# Patient Record
Sex: Male | Born: 1935 | Race: Asian | Hispanic: No
Health system: Southern US, Academic
[De-identification: ages and names within clinical notes are randomized; demographics above are authoritative.]

## PROBLEM LIST (undated history)

## (undated) ENCOUNTER — Encounter

## (undated) ENCOUNTER — Ambulatory Visit

## (undated) ENCOUNTER — Ambulatory Visit: Payer: MEDICARE

## (undated) ENCOUNTER — Telehealth

## (undated) ENCOUNTER — Encounter: Attending: Hematology & Oncology | Primary: Hematology & Oncology

## (undated) ENCOUNTER — Other Ambulatory Visit

## (undated) ENCOUNTER — Ambulatory Visit: Payer: Medicare (Managed Care) | Attending: Hematology & Oncology | Primary: Hematology & Oncology

## (undated) ENCOUNTER — Encounter: Attending: General Acute Care Hospital | Primary: General Acute Care Hospital

## (undated) ENCOUNTER — Encounter: Attending: Adult Health | Primary: Adult Health

## (undated) ENCOUNTER — Ambulatory Visit: Payer: Medicare (Managed Care)

## (undated) DIAGNOSIS — I2510 Atherosclerotic heart disease of native coronary artery without angina pectoris: Secondary | ICD-10-CM

## (undated) DIAGNOSIS — I998 Other disorder of circulatory system: Secondary | ICD-10-CM

## (undated) DIAGNOSIS — Z8673 Personal history of transient ischemic attack (TIA), and cerebral infarction without residual deficits: Secondary | ICD-10-CM

## (undated) DIAGNOSIS — I10 Essential (primary) hypertension: Secondary | ICD-10-CM

## (undated) DIAGNOSIS — C9190 Lymphoid leukemia, unspecified not having achieved remission: Secondary | ICD-10-CM

## (undated) DIAGNOSIS — E785 Hyperlipidemia, unspecified: Secondary | ICD-10-CM

## (undated) HISTORY — DX: Hyperlipidemia, unspecified: E78.5

## (undated) HISTORY — DX: Lymphoid leukemia, unspecified not having achieved remission: C91.90

## (undated) HISTORY — DX: Atherosclerotic heart disease of native coronary artery without angina pectoris: I25.10

## (undated) HISTORY — PX: HEMORROIDECTOMY: SUR656

## (undated) HISTORY — DX: Essential (primary) hypertension: I10

## (undated) HISTORY — DX: Personal history of transient ischemic attack (TIA), and cerebral infarction without residual deficits: Z86.73

## (undated) HISTORY — DX: Other disorder of circulatory system: I99.8

## (undated) HISTORY — PX: LEG SURGERY: SHX1003

## (undated) MED ORDER — LIRAGLUTIDE SUBQ: SUBCUTANEOUS | 0 days

---

## 1998-01-28 ENCOUNTER — Ambulatory Visit (HOSPITAL_COMMUNITY): Admission: RE | Admit: 1998-01-28 | Discharge: 1998-01-28 | Payer: Self-pay | Admitting: Endocrinology

## 1998-04-01 ENCOUNTER — Ambulatory Visit (HOSPITAL_COMMUNITY): Admission: RE | Admit: 1998-04-01 | Discharge: 1998-04-01 | Payer: Self-pay | Admitting: Ophthalmology

## 1999-05-27 ENCOUNTER — Ambulatory Visit (HOSPITAL_COMMUNITY): Admission: RE | Admit: 1999-05-27 | Discharge: 1999-05-27 | Payer: Self-pay | Admitting: Gastroenterology

## 2000-08-19 ENCOUNTER — Other Ambulatory Visit: Admission: RE | Admit: 2000-08-19 | Discharge: 2000-08-19 | Payer: Self-pay | Admitting: Oncology

## 2004-09-14 ENCOUNTER — Ambulatory Visit: Payer: Self-pay | Admitting: Oncology

## 2004-12-01 ENCOUNTER — Ambulatory Visit: Payer: Self-pay | Admitting: Oncology

## 2005-03-09 ENCOUNTER — Ambulatory Visit: Payer: Self-pay | Admitting: Oncology

## 2005-06-10 ENCOUNTER — Ambulatory Visit: Payer: Self-pay | Admitting: Oncology

## 2005-09-09 ENCOUNTER — Ambulatory Visit: Payer: Self-pay | Admitting: Oncology

## 2005-12-08 ENCOUNTER — Ambulatory Visit: Payer: Self-pay | Admitting: Oncology

## 2005-12-09 LAB — CBC WITH DIFFERENTIAL/PLATELET
BASO%: 0.7 % (ref 0.0–2.0)
EOS%: 5.9 % (ref 0.0–7.0)
LYMPH%: 31.3 % (ref 14.0–48.0)
MCH: 28.3 pg (ref 28.0–33.4)
MCHC: 33.3 g/dL (ref 32.0–35.9)
MONO#: 0.7 10*3/uL (ref 0.1–0.9)
Platelets: 224 10*3/uL (ref 145–400)
RBC: 3.9 10*6/uL — ABNORMAL LOW (ref 4.20–5.71)
WBC: 9.8 10*3/uL (ref 4.0–10.0)

## 2005-12-10 LAB — LACTATE DEHYDROGENASE: LDH: 104 U/L (ref 94–250)

## 2005-12-10 LAB — COMPREHENSIVE METABOLIC PANEL
ALT: 11 U/L (ref 0–40)
AST: 14 U/L (ref 0–37)
Alkaline Phosphatase: 55 U/L (ref 39–117)
CO2: 27 mEq/L (ref 19–32)
Sodium: 139 mEq/L (ref 135–145)
Total Bilirubin: 0.4 mg/dL (ref 0.3–1.2)
Total Protein: 6.1 g/dL (ref 6.0–8.3)

## 2005-12-10 LAB — BETA 2 MICROGLOBULIN, SERUM: Beta-2 Microglobulin: 2.59 mg/L — ABNORMAL HIGH (ref 1.01–1.73)

## 2006-01-06 LAB — CBC WITH DIFFERENTIAL/PLATELET
Basophils Absolute: 0.1 10*3/uL (ref 0.0–0.1)
Eosinophils Absolute: 0.4 10*3/uL (ref 0.0–0.5)
HGB: 11.6 g/dL — ABNORMAL LOW (ref 13.0–17.1)
LYMPH%: 28.6 % (ref 14.0–48.0)
MCV: 84.3 fL (ref 81.6–98.0)
MONO%: 7.5 % (ref 0.0–13.0)
NEUT#: 4.9 10*3/uL (ref 1.5–6.5)
Platelets: 234 10*3/uL (ref 145–400)

## 2006-01-22 ENCOUNTER — Ambulatory Visit: Payer: Self-pay | Admitting: Oncology

## 2006-01-27 LAB — CBC WITH DIFFERENTIAL/PLATELET
BASO%: 0.7 % (ref 0.0–2.0)
Basophils Absolute: 0.1 10*3/uL (ref 0.0–0.1)
EOS%: 5.9 % (ref 0.0–7.0)
Eosinophils Absolute: 0.5 10*3/uL (ref 0.0–0.5)
HCT: 36.4 % — ABNORMAL LOW (ref 38.7–49.9)
HGB: 11.9 g/dL — ABNORMAL LOW (ref 13.0–17.1)
LYMPH%: 29.8 % (ref 14.0–48.0)
MCH: 26.9 pg — ABNORMAL LOW (ref 28.0–33.4)
MCHC: 32.6 g/dL (ref 32.0–35.9)
MCV: 82.4 fL (ref 81.6–98.0)
MONO#: 0.7 10*3/uL (ref 0.1–0.9)
MONO%: 8.1 % (ref 0.0–13.0)
NEUT#: 4.9 10*3/uL (ref 1.5–6.5)
NEUT%: 55.5 % (ref 40.0–75.0)
Platelets: 195 10*3/uL (ref 145–400)
RBC: 4.42 10*6/uL (ref 4.20–5.71)
RDW: 14.4 % (ref 11.2–14.6)
WBC: 8.8 10*3/uL (ref 4.0–10.0)
lymph#: 2.6 10*3/uL (ref 0.9–3.3)

## 2006-02-17 LAB — CBC WITH DIFFERENTIAL/PLATELET
Basophils Absolute: 0.1 10*3/uL (ref 0.0–0.1)
Eosinophils Absolute: 0.4 10*3/uL (ref 0.0–0.5)
HCT: 35.9 % — ABNORMAL LOW (ref 38.7–49.9)
MCH: 26.7 pg — ABNORMAL LOW (ref 28.0–33.4)
MCHC: 33.1 g/dL (ref 32.0–35.9)
MONO%: 11.5 % (ref 0.0–13.0)
Platelets: 228 10*3/uL (ref 145–400)
RBC: 4.45 10*6/uL (ref 4.20–5.71)
WBC: 7.7 10*3/uL (ref 4.0–10.0)
lymph#: 2.3 10*3/uL (ref 0.9–3.3)

## 2006-03-10 ENCOUNTER — Ambulatory Visit: Payer: Self-pay | Admitting: Oncology

## 2006-03-10 LAB — CBC WITH DIFFERENTIAL/PLATELET
BASO%: 0.8 % (ref 0.0–2.0)
Basophils Absolute: 0.1 10*3/uL (ref 0.0–0.1)
EOS%: 4.2 % (ref 0.0–7.0)
Eosinophils Absolute: 0.3 10*3/uL (ref 0.0–0.5)
HCT: 37.6 % — ABNORMAL LOW (ref 38.7–49.9)
HGB: 12.5 g/dL — ABNORMAL LOW (ref 13.0–17.1)
LYMPH%: 30.6 % (ref 14.0–48.0)
MCH: 27 pg — ABNORMAL LOW (ref 28.0–33.4)
MCHC: 33.3 g/dL (ref 32.0–35.9)
MCV: 81.2 fL — ABNORMAL LOW (ref 81.6–98.0)
MONO#: 0.6 10*3/uL (ref 0.1–0.9)
MONO%: 7.5 % (ref 0.0–13.0)
NEUT#: 4.5 10*3/uL (ref 1.5–6.5)
NEUT%: 56.9 % (ref 40.0–75.0)
Platelets: 238 10*3/uL (ref 145–400)
RBC: 4.62 10*6/uL (ref 4.20–5.71)
RDW: 16.1 % — ABNORMAL HIGH (ref 11.2–14.6)
WBC: 7.9 10*3/uL (ref 4.0–10.0)
lymph#: 2.4 10*3/uL (ref 0.9–3.3)

## 2006-03-14 LAB — BETA 2 MICROGLOBULIN, SERUM: Beta-2 Microglobulin: 2.23 mg/L — ABNORMAL HIGH (ref 1.01–1.73)

## 2006-03-14 LAB — SPEP & IFE WITH QIG
Alpha-2-Globulin: 11.6 % (ref 7.1–11.8)
Beta Globulin: 7.9 % — ABNORMAL HIGH (ref 4.7–7.2)
Gamma Globulin: 6.1 % — ABNORMAL LOW (ref 11.1–18.8)
IgG (Immunoglobin G), Serum: 388 mg/dL — ABNORMAL LOW (ref 694–1618)
Total Protein, Serum Electrophoresis: 6 g/dL (ref 6.0–8.3)

## 2006-03-14 LAB — LACTATE DEHYDROGENASE: LDH: 106 U/L (ref 94–250)

## 2006-03-14 LAB — COMPREHENSIVE METABOLIC PANEL
Albumin: 4.3 g/dL (ref 3.5–5.2)
BUN: 18 mg/dL (ref 6–23)
Calcium: 9.2 mg/dL (ref 8.4–10.5)
Chloride: 95 mEq/L — ABNORMAL LOW (ref 96–112)
Glucose, Bld: 155 mg/dL — ABNORMAL HIGH (ref 70–99)
Potassium: 4.2 mEq/L (ref 3.5–5.3)

## 2006-04-21 LAB — CBC WITH DIFFERENTIAL/PLATELET
Basophils Absolute: 0.1 10*3/uL (ref 0.0–0.1)
HCT: 34.6 % — ABNORMAL LOW (ref 38.7–49.9)
HGB: 11.4 g/dL — ABNORMAL LOW (ref 13.0–17.1)
LYMPH%: 34.9 % (ref 14.0–48.0)
MONO#: 0.6 10*3/uL (ref 0.1–0.9)
NEUT%: 53.7 % (ref 40.0–75.0)
Platelets: 230 10*3/uL (ref 145–400)
WBC: 8.8 10*3/uL (ref 4.0–10.0)
lymph#: 3.1 10*3/uL (ref 0.9–3.3)

## 2006-05-06 ENCOUNTER — Ambulatory Visit: Payer: Self-pay | Admitting: Oncology

## 2006-05-09 LAB — CBC WITH DIFFERENTIAL/PLATELET
BASO%: 1.2 % (ref 0.0–2.0)
Basophils Absolute: 0.1 10*3/uL (ref 0.0–0.1)
EOS%: 3.6 % (ref 0.0–7.0)
HCT: 35.4 % — ABNORMAL LOW (ref 38.7–49.9)
HGB: 11.8 g/dL — ABNORMAL LOW (ref 13.0–17.1)
LYMPH%: 39.7 % (ref 14.0–48.0)
MCH: 27.3 pg — ABNORMAL LOW (ref 28.0–33.4)
MCHC: 33.2 g/dL (ref 32.0–35.9)
MCV: 82.2 fL (ref 81.6–98.0)
MONO%: 8.3 % (ref 0.0–13.0)
NEUT%: 47.2 % (ref 40.0–75.0)
Platelets: 204 10*3/uL (ref 145–400)

## 2006-05-09 LAB — COMPREHENSIVE METABOLIC PANEL
Albumin: 4.4 g/dL (ref 3.5–5.2)
Alkaline Phosphatase: 53 U/L (ref 39–117)
BUN: 20 mg/dL (ref 6–23)
Calcium: 9.5 mg/dL (ref 8.4–10.5)
Creatinine, Ser: 0.99 mg/dL (ref 0.40–1.50)
Glucose, Bld: 110 mg/dL — ABNORMAL HIGH (ref 70–99)
Potassium: 4.1 mEq/L (ref 3.5–5.3)

## 2006-05-09 LAB — MORPHOLOGY

## 2006-06-13 ENCOUNTER — Ambulatory Visit (HOSPITAL_COMMUNITY): Admission: RE | Admit: 2006-06-13 | Discharge: 2006-06-13 | Payer: Self-pay | Admitting: Gastroenterology

## 2006-06-13 ENCOUNTER — Encounter (INDEPENDENT_AMBULATORY_CARE_PROVIDER_SITE_OTHER): Payer: Self-pay | Admitting: *Deleted

## 2006-06-30 ENCOUNTER — Ambulatory Visit: Payer: Self-pay | Admitting: Oncology

## 2006-07-04 LAB — CBC WITH DIFFERENTIAL/PLATELET
BASO%: 0.7 % (ref 0.0–2.0)
Eosinophils Absolute: 0.4 10*3/uL (ref 0.0–0.5)
HCT: 35.6 % — ABNORMAL LOW (ref 38.7–49.9)
LYMPH%: 36.8 % (ref 14.0–48.0)
MCHC: 33.3 g/dL (ref 32.0–35.9)
MONO#: 0.7 10*3/uL (ref 0.1–0.9)
NEUT#: 5.1 10*3/uL (ref 1.5–6.5)
NEUT%: 51 % (ref 40.0–75.0)
Platelets: 200 10*3/uL (ref 145–400)
RBC: 4.19 10*6/uL — ABNORMAL LOW (ref 4.20–5.71)
WBC: 9.9 10*3/uL (ref 4.0–10.0)
lymph#: 3.7 10*3/uL — ABNORMAL HIGH (ref 0.9–3.3)

## 2006-08-24 ENCOUNTER — Ambulatory Visit: Payer: Self-pay | Admitting: Oncology

## 2006-08-29 LAB — CBC WITH DIFFERENTIAL/PLATELET
BASO%: 0.5 % (ref 0.0–2.0)
Eosinophils Absolute: 0.4 10*3/uL (ref 0.0–0.5)
LYMPH%: 30.5 % (ref 14.0–48.0)
MCHC: 32.8 g/dL (ref 32.0–35.9)
MONO#: 0.6 10*3/uL (ref 0.1–0.9)
NEUT#: 5.8 10*3/uL (ref 1.5–6.5)
RBC: 4.17 10*6/uL — ABNORMAL LOW (ref 4.20–5.71)
RDW: 13.9 % (ref 11.2–14.6)
WBC: 9.8 10*3/uL (ref 4.0–10.0)
lymph#: 3 10*3/uL (ref 0.9–3.3)

## 2006-09-26 LAB — CBC WITH DIFFERENTIAL/PLATELET
BASO%: 0.7 % (ref 0.0–2.0)
Basophils Absolute: 0.1 10*3/uL (ref 0.0–0.1)
HCT: 35.2 % — ABNORMAL LOW (ref 38.7–49.9)
HGB: 11.7 g/dL — ABNORMAL LOW (ref 13.0–17.1)
LYMPH%: 37.2 % (ref 14.0–48.0)
MCHC: 33.2 g/dL (ref 32.0–35.9)
MONO#: 0.7 10*3/uL (ref 0.1–0.9)
NEUT%: 51.7 % (ref 40.0–75.0)
Platelets: 172 10*3/uL (ref 145–400)
WBC: 9.6 10*3/uL (ref 4.0–10.0)

## 2006-12-21 ENCOUNTER — Ambulatory Visit: Payer: Self-pay | Admitting: Oncology

## 2006-12-26 LAB — CBC WITH DIFFERENTIAL/PLATELET
BASO%: 0.3 % (ref 0.0–2.0)
Basophils Absolute: 0 10*3/uL (ref 0.0–0.1)
EOS%: 2.2 % (ref 0.0–7.0)
HCT: 32.7 % — ABNORMAL LOW (ref 38.7–49.9)
HGB: 11.1 g/dL — ABNORMAL LOW (ref 13.0–17.1)
LYMPH%: 33.6 % (ref 14.0–48.0)
MCH: 28.7 pg (ref 28.0–33.4)
MCHC: 33.9 g/dL (ref 32.0–35.9)
MCV: 84.7 fL (ref 81.6–98.0)
MONO%: 8.7 % (ref 0.0–13.0)
NEUT%: 55.2 % (ref 40.0–75.0)
Platelets: 188 10*3/uL (ref 145–400)
lymph#: 3.2 10*3/uL (ref 0.9–3.3)

## 2006-12-27 LAB — COMPREHENSIVE METABOLIC PANEL
ALT: 16 U/L (ref 0–53)
AST: 13 U/L (ref 0–37)
Alkaline Phosphatase: 67 U/L (ref 39–117)
BUN: 18 mg/dL (ref 6–23)
Calcium: 8.8 mg/dL (ref 8.4–10.5)
Creatinine, Ser: 0.96 mg/dL (ref 0.40–1.50)
Total Bilirubin: 0.4 mg/dL (ref 0.3–1.2)

## 2007-03-09 ENCOUNTER — Ambulatory Visit: Payer: Self-pay | Admitting: Oncology

## 2007-03-13 LAB — CBC WITH DIFFERENTIAL/PLATELET
BASO%: 0.6 % (ref 0.0–2.0)
Basophils Absolute: 0.1 10*3/uL (ref 0.0–0.1)
EOS%: 3.7 % (ref 0.0–7.0)
HGB: 11.4 g/dL — ABNORMAL LOW (ref 13.0–17.1)
MCH: 28.3 pg (ref 28.0–33.4)
MCHC: 34 g/dL (ref 32.0–35.9)
MCV: 83.3 fL (ref 81.6–98.0)
MONO%: 7.1 % (ref 0.0–13.0)
NEUT%: 46.1 % (ref 40.0–75.0)
RDW: 13.6 % (ref 11.2–14.6)

## 2007-03-14 LAB — COMPREHENSIVE METABOLIC PANEL
AST: 15 U/L (ref 0–37)
Alkaline Phosphatase: 62 U/L (ref 39–117)
BUN: 19 mg/dL (ref 6–23)
Creatinine, Ser: 0.92 mg/dL (ref 0.40–1.50)
Potassium: 4.1 mEq/L (ref 3.5–5.3)

## 2007-06-15 ENCOUNTER — Ambulatory Visit: Payer: Self-pay | Admitting: Oncology

## 2007-06-20 LAB — CBC & DIFF AND RETIC
Basophils Absolute: 0.1 10*3/uL (ref 0.0–0.1)
Eosinophils Absolute: 0.4 10*3/uL (ref 0.0–0.5)
HCT: 33.9 % — ABNORMAL LOW (ref 38.7–49.9)
HGB: 11.6 g/dL — ABNORMAL LOW (ref 13.0–17.1)
LYMPH%: 43.7 % (ref 14.0–48.0)
MCH: 28.8 pg (ref 28.0–33.4)
MCV: 84.2 fL (ref 81.6–98.0)
MONO%: 6.4 % (ref 0.0–13.0)
NEUT#: 4.6 10*3/uL (ref 1.5–6.5)
NEUT%: 45 % (ref 40.0–75.0)
Platelets: 179 10*3/uL (ref 145–400)
RDW: 13.9 % (ref 11.2–14.6)
RETIC #: 67.7 10*3/uL (ref 31.8–103.9)

## 2007-06-21 LAB — COMPREHENSIVE METABOLIC PANEL
ALT: 13 U/L (ref 0–53)
AST: 15 U/L (ref 0–37)
Albumin: 4.3 g/dL (ref 3.5–5.2)
BUN: 17 mg/dL (ref 6–23)
CO2: 27 mEq/L (ref 19–32)
Calcium: 9.1 mg/dL (ref 8.4–10.5)
Chloride: 98 mEq/L (ref 96–112)
Potassium: 4.1 mEq/L (ref 3.5–5.3)

## 2007-06-21 LAB — LACTATE DEHYDROGENASE: LDH: 114 U/L (ref 94–250)

## 2007-06-21 LAB — IGG, IGA, IGM: IgG (Immunoglobin G), Serum: 303 mg/dL — ABNORMAL LOW (ref 694–1618)

## 2007-09-11 ENCOUNTER — Ambulatory Visit: Payer: Self-pay | Admitting: Oncology

## 2007-09-13 LAB — CBC WITH DIFFERENTIAL/PLATELET
Basophils Absolute: 0.1 10*3/uL (ref 0.0–0.1)
EOS%: 2.3 % (ref 0.0–7.0)
HCT: 32.6 % — ABNORMAL LOW (ref 38.7–49.9)
HGB: 10.6 g/dL — ABNORMAL LOW (ref 13.0–17.1)
LYMPH%: 45.4 % (ref 14.0–48.0)
MCH: 27.5 pg — ABNORMAL LOW (ref 28.0–33.4)
MCHC: 32.7 g/dL (ref 32.0–35.9)
MCV: 84.3 fL (ref 81.6–98.0)
MONO%: 4.5 % (ref 0.0–13.0)
NEUT%: 47.3 % (ref 40.0–75.0)

## 2007-09-14 LAB — IGG, IGA, IGM
IgA: 11 mg/dL — ABNORMAL LOW (ref 68–378)
IgM, Serum: 10 mg/dL — ABNORMAL LOW (ref 60–263)

## 2007-09-14 LAB — COMPREHENSIVE METABOLIC PANEL
AST: 13 U/L (ref 0–37)
Alkaline Phosphatase: 70 U/L (ref 39–117)
BUN: 18 mg/dL (ref 6–23)
Calcium: 8.8 mg/dL (ref 8.4–10.5)
Creatinine, Ser: 1 mg/dL (ref 0.40–1.50)
Total Bilirubin: 0.3 mg/dL (ref 0.3–1.2)

## 2007-11-30 ENCOUNTER — Ambulatory Visit: Payer: Self-pay | Admitting: Oncology

## 2007-12-01 ENCOUNTER — Ambulatory Visit: Payer: Self-pay | Admitting: Internal Medicine

## 2007-12-01 ENCOUNTER — Inpatient Hospital Stay (HOSPITAL_COMMUNITY): Admission: AD | Admit: 2007-12-01 | Discharge: 2007-12-04 | Payer: Self-pay | Admitting: Pulmonary Disease

## 2007-12-01 ENCOUNTER — Ambulatory Visit: Payer: Self-pay | Admitting: Pulmonary Disease

## 2007-12-07 ENCOUNTER — Telehealth: Payer: Self-pay | Admitting: Internal Medicine

## 2007-12-19 ENCOUNTER — Ambulatory Visit: Payer: Self-pay | Admitting: Internal Medicine

## 2007-12-19 DIAGNOSIS — R05 Cough: Secondary | ICD-10-CM

## 2008-03-14 ENCOUNTER — Ambulatory Visit: Payer: Self-pay | Admitting: Oncology

## 2008-03-19 LAB — CBC WITH DIFFERENTIAL/PLATELET
Basophils Absolute: 0 10*3/uL (ref 0.0–0.1)
Eosinophils Absolute: 0.1 10*3/uL (ref 0.0–0.5)
HGB: 10.9 g/dL — ABNORMAL LOW (ref 13.0–17.1)
MCV: 83.2 fL (ref 81.6–98.0)
MONO#: 0.9 10*3/uL (ref 0.1–0.9)
MONO%: 12 % (ref 0.0–13.0)
NEUT#: 3.7 10*3/uL (ref 1.5–6.5)
RDW: 15 % — ABNORMAL HIGH (ref 11.2–14.6)
WBC: 7.5 10*3/uL (ref 4.0–10.0)

## 2008-03-20 ENCOUNTER — Ambulatory Visit (HOSPITAL_COMMUNITY): Admission: RE | Admit: 2008-03-20 | Discharge: 2008-03-20 | Payer: Self-pay | Admitting: Oncology

## 2008-03-20 LAB — COMPREHENSIVE METABOLIC PANEL
Albumin: 4.2 g/dL (ref 3.5–5.2)
Alkaline Phosphatase: 60 U/L (ref 39–117)
BUN: 22 mg/dL (ref 6–23)
CO2: 28 mEq/L (ref 19–32)
Calcium: 9.2 mg/dL (ref 8.4–10.5)
Chloride: 94 mEq/L — ABNORMAL LOW (ref 96–112)
Glucose, Bld: 163 mg/dL — ABNORMAL HIGH (ref 70–99)
Potassium: 3.8 mEq/L (ref 3.5–5.3)
Sodium: 132 mEq/L — ABNORMAL LOW (ref 135–145)
Total Protein: 5.8 g/dL — ABNORMAL LOW (ref 6.0–8.3)

## 2008-03-20 LAB — BETA 2 MICROGLOBULIN, SERUM: Beta-2 Microglobulin: 4.38 mg/L — ABNORMAL HIGH (ref 1.01–1.73)

## 2008-04-12 ENCOUNTER — Ambulatory Visit: Payer: Self-pay | Admitting: Internal Medicine

## 2008-04-12 DIAGNOSIS — J8409 Other alveolar and parieto-alveolar conditions: Secondary | ICD-10-CM | POA: Insufficient documentation

## 2008-04-12 DIAGNOSIS — R609 Edema, unspecified: Secondary | ICD-10-CM

## 2008-04-12 DIAGNOSIS — J984 Other disorders of lung: Secondary | ICD-10-CM

## 2008-05-07 ENCOUNTER — Ambulatory Visit: Payer: Self-pay | Admitting: Cardiology

## 2008-07-29 ENCOUNTER — Ambulatory Visit: Payer: Self-pay | Admitting: Pulmonary Disease

## 2008-08-06 ENCOUNTER — Encounter (INDEPENDENT_AMBULATORY_CARE_PROVIDER_SITE_OTHER): Payer: Self-pay | Admitting: *Deleted

## 2008-08-21 ENCOUNTER — Ambulatory Visit: Payer: Self-pay | Admitting: Internal Medicine

## 2008-08-21 DIAGNOSIS — J45909 Unspecified asthma, uncomplicated: Secondary | ICD-10-CM | POA: Insufficient documentation

## 2008-11-07 ENCOUNTER — Telehealth (INDEPENDENT_AMBULATORY_CARE_PROVIDER_SITE_OTHER): Payer: Self-pay | Admitting: *Deleted

## 2008-12-13 ENCOUNTER — Ambulatory Visit: Payer: Self-pay | Admitting: Oncology

## 2008-12-17 LAB — CBC WITH DIFFERENTIAL/PLATELET
Basophils Absolute: 0 10*3/uL (ref 0.0–0.1)
EOS%: 3.2 % (ref 0.0–7.0)
HCT: 34.4 % — ABNORMAL LOW (ref 38.4–49.9)
HGB: 11.4 g/dL — ABNORMAL LOW (ref 13.0–17.1)
MCH: 27.8 pg (ref 27.2–33.4)
MCV: 83.7 fL (ref 79.3–98.0)
MONO%: 5.2 % (ref 0.0–14.0)
NEUT%: 40.9 % (ref 39.0–75.0)

## 2008-12-18 LAB — COMPREHENSIVE METABOLIC PANEL
AST: 15 U/L (ref 0–37)
Alkaline Phosphatase: 62 U/L (ref 39–117)
BUN: 22 mg/dL (ref 6–23)
Calcium: 9.1 mg/dL (ref 8.4–10.5)
Chloride: 97 mEq/L (ref 96–112)
Creatinine, Ser: 1.08 mg/dL (ref 0.40–1.50)
Glucose, Bld: 102 mg/dL — ABNORMAL HIGH (ref 70–99)

## 2009-05-22 ENCOUNTER — Telehealth (INDEPENDENT_AMBULATORY_CARE_PROVIDER_SITE_OTHER): Payer: Self-pay | Admitting: *Deleted

## 2009-06-09 ENCOUNTER — Encounter (INDEPENDENT_AMBULATORY_CARE_PROVIDER_SITE_OTHER): Payer: Self-pay | Admitting: Internal Medicine

## 2009-06-09 ENCOUNTER — Inpatient Hospital Stay (HOSPITAL_COMMUNITY): Admission: EM | Admit: 2009-06-09 | Discharge: 2009-06-10 | Payer: Self-pay | Admitting: Emergency Medicine

## 2009-06-09 ENCOUNTER — Ambulatory Visit: Payer: Self-pay | Admitting: Oncology

## 2009-06-09 HISTORY — PX: US ECHOCARDIOGRAPHY: HXRAD669

## 2009-06-10 ENCOUNTER — Ambulatory Visit: Payer: Self-pay | Admitting: Oncology

## 2009-10-03 ENCOUNTER — Ambulatory Visit: Payer: Self-pay | Admitting: Oncology

## 2009-10-09 LAB — CBC & DIFF AND RETIC
BASO%: 0.5 % (ref 0.0–2.0)
Eosinophils Absolute: 0.5 10*3/uL (ref 0.0–0.5)
Immature Retic Fract: 10.3 % (ref 0.00–13.40)
LYMPH%: 51.4 % — ABNORMAL HIGH (ref 14.0–49.0)
Platelets: 177 10*3/uL (ref 140–400)
RBC: 3.99 10*6/uL — ABNORMAL LOW (ref 4.20–5.82)
RDW: 14.1 % (ref 11.0–14.6)
Retic %: 1.4 % (ref 0.50–1.60)
Retic Ct Abs: 55.86 10*3/uL (ref 24.10–77.50)
WBC: 13.2 10*3/uL — ABNORMAL HIGH (ref 4.0–10.3)
lymph#: 6.8 10*3/uL — ABNORMAL HIGH (ref 0.9–3.3)

## 2009-10-09 LAB — COMPREHENSIVE METABOLIC PANEL
ALT: 16 U/L (ref 0–53)
Albumin: 4 g/dL (ref 3.5–5.2)
Alkaline Phosphatase: 45 U/L (ref 39–117)
CO2: 30 mEq/L (ref 19–32)
Potassium: 4.4 mEq/L (ref 3.5–5.3)
Sodium: 141 mEq/L (ref 135–145)
Total Bilirubin: 0.6 mg/dL (ref 0.3–1.2)

## 2009-10-09 LAB — LACTATE DEHYDROGENASE: LDH: 112 U/L (ref 94–250)

## 2010-03-14 ENCOUNTER — Emergency Department (HOSPITAL_COMMUNITY)
Admission: EM | Admit: 2010-03-14 | Discharge: 2010-03-14 | Payer: Self-pay | Source: Home / Self Care | Admitting: Emergency Medicine

## 2010-03-16 ENCOUNTER — Telehealth (INDEPENDENT_AMBULATORY_CARE_PROVIDER_SITE_OTHER): Payer: Self-pay | Admitting: *Deleted

## 2010-03-20 ENCOUNTER — Ambulatory Visit: Payer: Self-pay | Admitting: Internal Medicine

## 2010-04-07 ENCOUNTER — Ambulatory Visit: Payer: Self-pay | Admitting: Internal Medicine

## 2010-04-07 ENCOUNTER — Telehealth: Payer: Self-pay | Admitting: Internal Medicine

## 2010-04-17 ENCOUNTER — Ambulatory Visit: Payer: Self-pay | Admitting: Internal Medicine

## 2010-04-17 DIAGNOSIS — B370 Candidal stomatitis: Secondary | ICD-10-CM

## 2010-04-20 ENCOUNTER — Ambulatory Visit: Payer: Self-pay | Admitting: Cardiovascular Disease

## 2010-04-21 ENCOUNTER — Telehealth (INDEPENDENT_AMBULATORY_CARE_PROVIDER_SITE_OTHER): Payer: Self-pay | Admitting: *Deleted

## 2010-04-27 ENCOUNTER — Telehealth (INDEPENDENT_AMBULATORY_CARE_PROVIDER_SITE_OTHER): Payer: Self-pay | Admitting: *Deleted

## 2010-04-28 ENCOUNTER — Ambulatory Visit: Payer: Self-pay

## 2010-04-28 ENCOUNTER — Ambulatory Visit: Payer: Self-pay | Admitting: Cardiology

## 2010-04-28 ENCOUNTER — Encounter: Payer: Self-pay | Admitting: Cardiology

## 2010-04-28 ENCOUNTER — Encounter (HOSPITAL_COMMUNITY)
Admission: RE | Admit: 2010-04-28 | Discharge: 2010-05-25 | Payer: Self-pay | Source: Home / Self Care | Admitting: Cardiovascular Disease

## 2010-04-29 ENCOUNTER — Ambulatory Visit: Payer: Self-pay | Admitting: Cardiovascular Disease

## 2010-04-30 ENCOUNTER — Ambulatory Visit: Payer: Self-pay | Admitting: Cardiovascular Disease

## 2010-05-01 ENCOUNTER — Inpatient Hospital Stay (HOSPITAL_BASED_OUTPATIENT_CLINIC_OR_DEPARTMENT_OTHER): Admission: RE | Admit: 2010-05-01 | Discharge: 2010-05-01 | Payer: Self-pay | Admitting: Cardiovascular Disease

## 2010-05-01 ENCOUNTER — Ambulatory Visit: Payer: Self-pay | Admitting: Cardiovascular Disease

## 2010-05-08 ENCOUNTER — Ambulatory Visit: Payer: Self-pay | Admitting: Internal Medicine

## 2010-05-15 ENCOUNTER — Ambulatory Visit: Payer: Self-pay | Admitting: Cardiovascular Disease

## 2010-05-15 ENCOUNTER — Encounter: Payer: Self-pay | Admitting: Internal Medicine

## 2010-05-29 ENCOUNTER — Ambulatory Visit: Payer: Self-pay | Admitting: Internal Medicine

## 2010-06-19 ENCOUNTER — Telehealth: Payer: Self-pay | Admitting: Internal Medicine

## 2010-07-08 ENCOUNTER — Ambulatory Visit: Payer: Self-pay | Admitting: Cardiology

## 2010-07-10 ENCOUNTER — Ambulatory Visit: Payer: Self-pay | Admitting: Internal Medicine

## 2010-07-10 LAB — CONVERTED CEMR LAB
CRP, High Sensitivity: 11.05 — ABNORMAL HIGH (ref 0.00–5.00)
Pro B Natriuretic peptide (BNP): 87.4 pg/mL (ref 0.0–100.0)

## 2010-08-22 ENCOUNTER — Emergency Department (HOSPITAL_COMMUNITY)
Admission: EM | Admit: 2010-08-22 | Discharge: 2010-08-23 | Payer: Self-pay | Source: Home / Self Care | Admitting: Emergency Medicine

## 2010-09-09 ENCOUNTER — Ambulatory Visit: Payer: Self-pay | Admitting: Internal Medicine

## 2010-09-11 ENCOUNTER — Ambulatory Visit
Admission: RE | Admit: 2010-09-11 | Discharge: 2010-09-11 | Payer: Self-pay | Source: Home / Self Care | Attending: Internal Medicine | Admitting: Internal Medicine

## 2010-09-17 ENCOUNTER — Encounter
Admission: RE | Admit: 2010-09-17 | Discharge: 2010-09-17 | Payer: Self-pay | Source: Home / Self Care | Attending: Gastroenterology | Admitting: Gastroenterology

## 2010-09-18 ENCOUNTER — Other Ambulatory Visit: Payer: Self-pay | Admitting: Internal Medicine

## 2010-09-18 DIAGNOSIS — R911 Solitary pulmonary nodule: Secondary | ICD-10-CM

## 2010-09-20 ENCOUNTER — Encounter: Payer: Self-pay | Admitting: Internal Medicine

## 2010-09-20 ENCOUNTER — Encounter: Payer: Self-pay | Admitting: Gastroenterology

## 2010-09-29 NOTE — Progress Notes (Signed)
  Phone Note Outgoing Call Call back at [facility] Phone [phone]   Call placed by: Darrick Penna Call placed to: Patient Reason for Call: Confirm/change Appt Summary of Call: Reviewed information on My[employer] Information Sheet (see scanned document for further details).  Spoke with patient.      Nuclear Med Background Indications for Stress Test: Evaluation for Ischemia, Stent Patency   History: Angioplasty, Asthma, Echo, Heart Catheterization, Myocardial Perfusion Study, Stents  History Comments: 99 MPS normal 99 Stent RCA 10 Echo normal  Symptoms: DOE    Nuclear Pre-Procedure Cardiac Risk Factors: Hypertension, IDDM Type 2, Lipids, TIA Height (in): 65

## 2010-09-29 NOTE — Assessment & Plan Note (Signed)
Summary: rov/ mbw   Visit Type:  Follow-up Copy to:  Dr. Charna Elizabeth Primary Provider/Referring Provider:  Dr. Reather Littler - PMD, Dr Marchelle Gearing  - Pulmonary, Dr Arty Baumgartner- GI, Dr Katherina Right - Cards, Dr Darnelle Catalan - Onc  CC:  The patient is here for follow-up...PFT's today...CT chest and stress test have been done...the patient says his breathing has improved but still has a cough that is mostly dry and he notices this after meals and at night.  History of Present Illness: BRIEF: Micheal Rivers was admitted to [facility] in April 2009 for  rt sided pneumonia; b) new asthma exacerbation physiology - admit Fev1 30%; and  c) chronic cough in setting of URI and  ACE inhibitor; ACE inhibitor stopped. At followup, he felt well and stopped all asthma inhalers.   OV 04/12/2008: returns to followup after >3 months. A month ago had cough/wheeze. Saw Dr. Darrall Dears PA for routine followup (see pmhx) and reminded to have his pna followup CT chest. This done on 03/20/2008 shows new LL Sup Segment nodules with tree in bud. However, cough/wheeze have spontanously resolved.  However, still dyspneic when taking aerobic walks  No new problems. REC: repeat CT in 2 monts  CT 05/07/2008: :LB6 tree in bud - resolved. Old stable 4mm nodules +. PPD - negative. REC: expectant followup. Rpt CT in 1 year for 4mm nodules in Sept 2010  OV 07/29/2008: Saw Dr. Shelle Iron on urgent basis for chest congestion. Was wheezing on exam. CXR - negative. REC: restart symbicort, short course prednisone  OV 08/21/2008: Feels fine. No dyspnea, congestion, wheeze, chest pain, hemoptysis, edema, chills, weight loss. Compliant with symbicort 160/4.5 but is unsure why he needs it. Asking for lower dose so started on symbicort 80/.4.5 2 puff bid. .    OV 03/20/2010: Not seen in 19 months.  STates he was doing well so he felt there was no need to see me. He did not take symbicort as advised., 6-8 months ago developed cough again. Cough made worse immediately after eating  or when lying down.Cough improved by resting. Albuterol as needed does not make any difference. Cough is mostly dry. Also past 4-6 months worsening dyspnea. Now working out at [employer] and notices dyspnea on ellipticals or treadmills after 5-10 minutes. Dyspnea is relieved by rest and albuterol inhaler. Occasional associated wheezing present and GERD present on occassoion after spicy food. He takes sample dexilant for this. HE did have worsening on 03/14/2010 and went toER. Was discharged after neb Rx on prednisone +/- abx. Currently doing better with pred but feels symptoms coming back after prednisone being tapered off. Of note, when we exerted him in office he did get very dyspneic walking 185 feet x 2 . REC: START DULERA, CONTINUE DEXLILANT, BE VERY COMPLIANT, ROV with PFT   OV April 17, 2010: Now compliant with [facility] and DExilant. In fact has been using dulera 4 puff two times a day (2 times the dose) but his technique is poor. Feels cough and "breathing" is 70% better but "dyspnea" (in his terms that is dyspnea with exertion) is unchanged or only somewhat better. No new complaints. REC: a) 1 year CT chest; b) Rx THRUSH; c) RE-EDUCATED on DULERA; d) START SINGULAIR; e) COTNINUE DEXILANT and aNTI-GERD measures      May 29, 2010: Followup Nodule, ASthma, Cough (due to Asthma & Gerd), and Dyspnea (due to Asthma and ? other causes). He has intentionally lost 7# since last visit. Also, compliant with singulair  started last visit and dulera. With above, dyspnea on exertion has resolved. IN fact, he walked 185 feet x 3 laps in office wihtout problems; a huge improvement in 2 months. CArdiac cause for dyspnea has been ruled out. HAd Cath 05/01/2010 (Dr Elease Hashimoto) that showed patent stent, normal LV and no other coronary abnormalities. IN terms of cough, he is still bothered by it. IT is UNCHANGED. This is despite weight loss, improved dietary compliance advised by Dr. Loreta Ave. Cough is dry. Moderate in  severity.  Occurs mostly when he lies down to go to bed and has 5-15 episodes. Better in sitting posture. Does not bother his sleep. Happens some during day too. Singulair and dulera and dexilant not helping cough. CT scan done 05/08/2010 shows new LLL basal segment airspace disease but he denies infectious symptoms of fever. Denies frank choking.   Preventive Screening-Counseling & Management  Alcohol-Tobacco     Smoking Status: never     Passive Smoke Exposure: no  Current Medications (verified): 1)  Vytorin 10-20 Mg Tabs (Ezetimibe-Simvastatin) .... Take 1 Tablet By Mouth Once A Day 2)  Tekturna 300 Mg  Tabs (Aliskiren Fumarate) .... Once Daily 3)  Labetalol Hcl 200 Mg  Tabs (Labetalol Hcl) .... Once Daily 4)  Cardura 8 Mg  Tabs (Doxazosin Mesylate) .... At Bedtime 5)  Aggrenox 25-200 Mg Xr12h-Cap (Aspirin-Dipyridamole) .... Take One Tablet By Mouth  Two Times A Day 6)  Glimepiride 1 Mg Tabs (Glimepiride) .... Take 1 Tablet By Mouth Once A Day 7)  Actos 15 Mg  Tabs (Pioglitazone Hcl) .... Once Daily 8)  Metformin Hcl 500 Mg  Tb24 (Metformin Hcl) .Marland Kitchen.. 1  Tab By Mouth in The Morning and 3 Tablets By Mouth in The Evening 9)  Indapamide 1.25 Mg  Tabs (Indapamide) .... Once Daily 10)  Norvasc 10 Mg Tabs (Amlodipine Besylate) .... Take 1 Tablet By Mouth Once A Day 11)  Oxybutynin Chloride 5 Mg Tabs (Oxybutynin Chloride) .... Take One Tablet By Mouth At Bedtime 12)  Victoza 18 Mg/55ml Soln (Liraglutide) .... Take 0.6 Mg Injection At Bedtime 13)  Vitamin D3 5000 Unit Caps (Cholecalciferol) .... Take 1 Tablet By Mouth Once A Day 14)  Osteo Bi-Flex Adv Double St  Tabs (Misc Natural Products) .... Take 1 Tablet By Mouth Once A Day 15)  Flax Seed Oil 1000 Mg Caps (Flaxseed (Linseed)) .... Take 1 Tablet By Mouth Once A Day 16)  Vitamin B-12 1000 Mcg Tabs (Cyanocobalamin) .... Take 1 Tablet By Mouth Once A Day 17)  Singulair 10 Mg Tabs (Montelukast Sodium) .Marland Kitchen.. 1 By Mouth At Bedtime 18)  Dulera 200-5  Mcg/act Aero (Mometasone Furo-Formoterol Fum) .... 2 Puffs Two Times A Day 19)  Dexilant 30 Mg Cpdr (Dexlansoprazole) .Marland Kitchen.. 1 By Mouth Daily  Allergies (verified): No Known Drug Allergies  Past History:  Past medical, surgical, family and social histories (including risk factors) reviewed, and no changes noted (except as noted below).  Past Medical History: Reviewed history from 08/21/2008 and no changes required. CLL x 1998. Took rituxan 2006/2007. Followed by Dr. Patrice Paradise. Under remission per history Early sigmoid diverticula on colonoscopy 2007 Diabetes Hyperlipidemia Post viral reactive cough since 2003. Typical episodes last 2-4 weeks.  [place] in 07/2007 and had URI/"cold" and then had cough that resolved in 3 weeks (typical for him).  Normal stress test per hx - 10/2007.  CAD/MI 1998 DEcember - while in [place].  s/ coronary stent by Dr. Elease Hashimoto. EDema in legs - noted August 2009 exam.  Per patient prior edema has beeon only on air travel to [place] PPD negative - 03/2008 - 04/2008  Past Surgical History: Reviewed history from 12/01/2007 and no changes required. s/p colonoscopy 2007 - sessile hyperplastic polyp s/p coronary stent  Past Pulmonary History:  Pulmonary History: #ASTHMA  03/20/2010: Poor tech ->   Fev1 1.14L/45%, FVC 1.45L/41.5%, Ratio 79 -> start dulera  04/07/2010 Good tech - FEv1 1.79:/75%, FVC 2.4L/67% -> add singulair 05/29/2010: Fev1 1.85L/77%, Ratio 75 (69)  Family History: Reviewed history from 12/01/2007 and no changes required. Denies asthma, copd Family History C V A / Stroke : dad, mom Family History Coronary Heart Disease: mom Family History Diabetes: dad Family History Hypertension: mom  Social History: Reviewed history from 03/20/2010 and no changes required. 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  The patient denies shortness of breath with activity, shortness of breath at rest, productive cough, coughing up blood, chest pain, irregular heartbeats, acid heartburn, indigestion, loss of appetite, weight change, abdominal pain, difficulty swallowing, sore throat, tooth/dental problems, headaches, nasal congestion/difficulty breathing through nose, sneezing, itching, ear ache, anxiety, depression, hand/feet swelling, joint stiffness or pain, rash, change in color of mucus, and fever.    Vital Signs:  Patient profile:   76 year old male Height:      65 inches (165.10 cm) Weight:      178 pounds (80.91 kg) BMI:     29.73 O2 Sat:      96 % on Room air Temp:     98.1 degrees F (36.72 degrees C) oral Pulse rate:   78 / minute BP sitting:   124 / 78  (right arm) Cuff size:   regular  Vitals Entered By: Michel Bickers CMA (May 29, 2010 9:29 AM)  O2 Sat at Rest %:  96 O2 Flow:  Room air  Serial Vital Signs/Assessments:  Comments: Ambulatory Pulse Oximetry  Resting; HR__63___    02 Sat__96___  Lap1 (185 feet)   HR_97____   02 Sat_96____ Lap2 (185 feet)   HR__106___   02 Sat__94___    Lap3 (185 feet)   HR__96___   02 Sat___95__  _x__Test Completed without Difficulty ___Test Stopped due to:   By: Carron Curie CMA   CC: The patient is here for follow-up...PFT's today...CT chest and stress test have been done...the patient says his breathing has improved but still has a cough that is mostly dry and he notices this after meals and at night Comments Medications reviewed with patient Daytime phone verified. Michel Bickers CMA  May 29, 2010 9:39 AM   Physical Exam  General:   male in nad. [place] like he lost some weight Head:  normocephalic and atraumatic Eyes:  PERRLA/EOM intact; conjunctiva and sclera clear Ears:  TMs intact and clear with normal canals Nose:  no deformity, discharge, inflammation, or lesions Mouth:  no deformity or lesions.  some cariesMelampatti Class III.  Thrush resolved. Neck:  no masses, thyromegaly, or abnormal cervical nodes Chest Wall:  no deformities noted Lungs:  clear bilaterally to auscultation and percussion Heart:  egular rhythm, normal rate, no murmurs, no rubs, and no gallops.   Abdomen:  truncal obesity, soft, normal bowel sounds, no mass Msk:  no deformity or scoliosis noted with normal posture Pulses:  pulses normal Extremities:  new  bilateral edema + to ++ present (never noticed edema in past) Neurologic:  CN II-XII grossly intact with normal reflexes, coordination, muscle strength and tone Skin:  intact without lesions or rashes Cervical Nodes:  no significant adenopathy Psych:  alert and cooperative; normal mood and affect; normal attention span and concentration   CT of Chest  Procedure date:  05/08/2010  Findings:      NEW LLL basal segment airspace dissease compared to sept 2009 Old nodules are stable/better and need no further followup  Pulmonary Function Test Date: 05/29/2010 Gender: Male  Pre-Spirometry FVC    Value: 2.46 L/min   % Pred: 69% % FEV1    Value: 1.85 L     % Pred: 77% % FEV1/FVC  Value: 75 %     Pred: 69 %     Impression & Recommendations:  Problem # 1:  THRUSH (ICD-112.0) Assessment Improved reswolved  plan innstructed to wash/rinse mouth after mdi use Orders: Est. Patient Level IV (16109) HFA Instruction (60454)  Problem # 2:  PULMONARY NODULE (ICD-518.89) Assessment: Improved  4mm noodule(s) in CT sept 2009. REsolved/Stable/Imprived CT 2011  plan no further followup   Problem # 3:  INTRINSIC ASTHMA, UNSPECIFIED (ICD-493.10) Assessment: Improved  FEv1 shows further improvement after adding singulair in aug 2011 to dulera and with proper compliance. In fact, associated wtit this dyspnea has resolved. tehrefore, have to assume that dyspnea was due to asthma.  Will continue dulera (samples) and singulair (script sent to various pharmacies so  he can shop costs, he will also ask about accolate). Will do spirometry at fu. Flu shot if not done already  Problem # 4:  COUGH (ICD-786.2) Assessment: Deteriorated Cough is unchanged despite fev1/asthma improvement.  CT 05/08/2010 shows new LLL infitlrates - he did not have infectious symptoms at that time. Wonder if he is having GERD refractory to dexilant. This is based on fact cough is worse when he lies down and he has new Lower Lobe infitlrates. Spoke to his GI Doc Dr. Loreta Ave. REcommended pH probe or BRAVO study to sort this out.  Orders: Est. 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 No ninfectious symptoms. ? aspiration  plan per cough/gerd section - await results of BRAVO study repeat ct 07/08/2010 - 2months or so  Orders: Est. Patient Level IV (29562) HFA Instruction [phone]) Radiology Referral (Radiology)  Medications Added to Medication List This Visit: 1)  Singulair 10 Mg Tabs (Montelukast sodium) .Marland Kitchen.. 1 by mouth at bedtime 2)  Dulera 200-5 Mcg/act Aero (Mometasone furo-formoterol fum) .... 2 puffs two times a day 3)  Dexilant 30 Mg Cpdr (Dexlansoprazole) .Marland Kitchen.. 1 by mouth daily  Other Orders: Prescription Created Electronically [phone]) Flu Vaccine 58yrs + MEDICARE PATIENTS (N6295) Administration Flu vaccine - MCR (M8413)  Patient Instructions: 1)  #ASTHMA 2)   - numbers are best ever 3)   - continue singulair 10mg  by mouth at bedtime 4)       - [facility] 5)      - also ask them if accolate is cheaper than singulair 6)   - contnue dulera 2 puff two times a day - take 2 samples 7)   - show your technique again to my nurse 8)  #COUGH 9)    - sorry it is not better 10)   -have the BRAVO study by Dr. Loreta Ave - call her  11)  #LEFT LUNG NODULE 12)    - have CT scan chest around 07/08/2010 13)   #FOLLOWUP 14)   - after CT chest 15)   - spirometry at PFT lab at return Prescriptions: SINGULAIR 10 MG TABS (MONTELUKAST SODIUM) 1 by mouth at bedtime  #30 x 1   Entered and Authorized by:   Kalman Shan MD   Signed by:   Kalman Shan MD on 05/29/2010   Method used:   Electronically to        [facility]  [phone]* (retail)       [address]       [place], [place]  [address]       Ph: [phone] or [phone]       Fax: [phone]   RxID:   [id number] SINGULAIR 10 MG TABS (MONTELUKAST SODIUM) 1 by mouth at bedtime  #30 x 1   Entered and Authorized by:   Kalman Shan MD   Signed by:   Kalman Shan MD on 05/29/2010   Method used:   Electronically to        [facility] [address] (retail)       [address] [place], [place]  [address]       Ph: [phone]       Fax: [phone]   RxID:   [id number]   Flu Vaccine Consent Questions     Do you have a history of severe allergic reactions to this vaccine? no    Any prior history of allergic reactions to egg and/or gelatin? no    Do you have a sensitivity to the preservative Thimersol? no    Do you have a past history of Guillan-Barre Syndrome? no    Do you currently have an acute febrile illness? no    Have you ever had a severe reaction to latex? no    Vaccine information given and explained to patient? yes    Are you currently pregnant? no    Lot Number:[id number]   Exp Date:02/27/2011   Site Given  right Deltoid IMlu Carron Curie CMA  May 29, 2010 11:22 AM

## 2010-09-29 NOTE — Miscellaneous (Signed)
Summary: Orders Update pft charges  Clinical Lists Changes  Orders: Added new Service order of Carbon Monoxide diffusing w/capacity (94720) - Signed Added new Service order of Lung Volumes (94240) - Signed Added new Service order of Spirometry (Pre & Post) (94060) - Signed 

## 2010-09-29 NOTE — Progress Notes (Signed)
Summary: pft better  Phone Note Outgoing Call   Summary of Call: pls let him know pft have improved with the dulera. He should continue with it Initial call taken by: Kalman Shan MD,  April 07, 2010 5:41 PM  Follow-up for Phone Call        [facility] X1 Carver Fila  April 10, 2010 2:29 PM   Additional Follow-up for Phone Call Additional follow up Details #1::        pt called back and informed him of the results. pt verbalized understanding. Pt is aware of his apt he has aug 19 Mindy Silva  April 10, 2010 4:45 PM

## 2010-09-29 NOTE — Letter (Signed)
Summary: [facility]   Imported By: Sherian Rein 05/27/2010 11:41:52  _____________________________________________________________________  External Attachment:    Type:   Image     Comment:   External Document

## 2010-09-29 NOTE — Assessment & Plan Note (Signed)
Summary: hfu//lmr   Visit Type:  Hospital Follow-up Copy to:  Dr. Charna Elizabeth Primary Provider/Referring Provider:  Dr. Reather Littler  CC:  hospital follow today---meds updated with pt today--discuss his BP meds today--cough started back yesterday after finishing the prednisone taper--with yellow sputum with  the cough he will have some SOB.  History of Present Illness: BRIEF: Micheal Rivers was admitted to [facility] in April 2009 for  rt sided pneumonia; b) new asthma exacerbation physiology - admit Fev1 30%; and  c) ACE inhibitor stopped due to background history of chronic cough following URI alhtough unsure if this was cause of cough. AT followup, he felt well and stopped all asthma inhalers.   OV 04/12/2008: returns to followup after >3 months. A months ago had cough/wheeze. Saw Dr. Darrall Dears PA for routine followup (see pmhx) and reminded to have his pna followup CT chest. This done on 03/20/2008 shows new LL Sup Segment nodules with tree in bud. However, cough/wheeze have spontanously resolved.  However, still dyspneic when taking aerobic walks  No new problems. REC: repeat CT in 2 monts  CT 05/07/2008: :LB6 tree in bud - resolved. Old stable 4mm nodules +. PPD - negative. REC: expectant followup. Rpt CT in 1 year for 4mm nodules in Sept 2010  OV 07/29/2008: Saw Dr. Shelle Iron on urgent basis for chest congestion. Was wheezing on exam. CXR - negative. REC: restart symbicort, short course prednisone  OV 08/21/2008: Feels fine. No dyspnea, congestion, wheeze, chest pain, hemoptysis, edema, chills, weight loss. Compliant with symbicort 160/4.5 but is unsure why he needs it. Asking for lower dose so started on symbicort 80/.4.5 2 puff bid. .    OV 03/20/2010: Not seen in 19 months. Apologized for no shows. STates he was doing well so he felt there was no need to see me. He did not take symbicort as advised 19 months ago. However, 6-8 months ago developed cough again. Cough made worse immediately after eating or  when lying down.Cough improved by resting. Albuterol as needed does not make any difference to cough. Cough is mostly dry. Also past 4-6 months worsening dyspnea. Now working out at [employer] and notices dyspnea on ellipticals or treadmills after 5-10 minutes. Dyspnea is relieved by rest and albuterol inhaler. Occasional associated wheezing present and GERD present on occassoion after spicy food. He takes sample dexilant for this. HE did have worsening on 03/14/2010 and went toER. Was discharged after neb Rx on prednisone +/- abx. Currently doing better with pred but feels symptoms coming back after prednisone being tapered off. Of note, when we exerted him in office he did get very dyspneic walking 185 feet x 2   Preventive Screening-Counseling & Management  Alcohol-Tobacco     Smoking Status: never     Passive Smoke Exposure: no  Allergies (verified): No Known Drug Allergies  Comments:  Nurse/Medical Assistant: The patient's medications and allergies were reviewed with the patient and were updated in the Medication and Allergy Lists.  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 CLL x 1998. Took rituxan 2006/2007. Followed by Dr. Patrice Paradise. Under remission per history Early sigmoid diverticula on colonoscopy 2007 Diabetes Hyperlipidemia Post viral reactive cough since 2003. Typical episodes last 2-4 weeks.  [place] in 07/2007 and had URI/"cold" and then  had cough that resolved in 3 weeks (typical for him).  Normal stress test per hx - 10/2007.  CAD/MI 1998 DEcember - while in [place].  s/ coronary stent by Dr. Elease Hashimoto. EDema in legs - noted August 2009 exam. Per patient prior edema has beeon only on air travel to [place] PPD negative - 03/2008 - 04/2008  Past Surgical History: Reviewed history from 12/01/2007 and no changes required. s/p colonoscopy 2007 - sessile hyperplastic polyp s/p coronary stent  Family History: Reviewed history from 12/01/2007 and no changes required. 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  The patient denies anorexia, fever, weight loss, weight gain, vision loss, decreased hearing, hoarseness, chest pain, syncope, peripheral edema, headaches, hemoptysis, abdominal pain, melena, hematochezia, severe indigestion/heartburn, hematuria, incontinence, genital sores, muscle weakness, suspicious skin lesions, transient blindness, difficulty walking, depression, unusual weight change, abnormal bleeding, enlarged lymph nodes, angioedema, breast masses, and testicular masses.    Vital Signs:  Patient profile:   76 year old male Height:      65 inches Weight:      184.50 pounds BMI:     30.81 O2 Sat:      96 % on Room air Temp:     98.0 degrees F oral Pulse rate:   71 / minute BP sitting:   128 / 50  (right arm) Cuff size:   regular  Vitals Entered By: Randell Loop CMA (March 20, 2010 10:09 AM)  O2 Sat at Rest %:  96 O2 Flow:  Room air CC: hospital follow today---meds updated with pt today--discuss his BP meds today--cough started back  yesterday after finishing the prednisone taper--with yellow sputum with  the cough he will have some SOB Is Patient Diabetic? Yes Pain Assessment Patient in pain? no      Comments MEDS REVIEWED WITH PT DAYTIME PHONE NUMBER REVIEWED TODAY Randell Loop CMA  March 20, 2010 10:16 AM    Ambulatory Pulse Oximetry  Resting; HR  81   02 Sat 98ra  Lap1 (185 feet)   HR  79    02 Sat  96%ra Lap2 (185 feet)   HR  84    02 Sat 96%ra Lap3 (185 feet)   HR  84   02 Sat  99%ra  XX___Test Completed without Difficulty ___Test Stopped due to: Randell Loop CMA  March 20, 2010 11:26 AM     Physical Exam  General:   male in nad Head:  normocephalic and atraumatic Eyes:  PERRLA/EOM intact; conjunctiva and sclera clear Ears:  TMs intact and clear with normal canals Nose:  no deformity, discharge, inflammation, or lesions Mouth:  no deformity or lesions. some cariesMelampatti Class III.   Neck:  no masses, thyromegaly, or abnormal cervical nodes Chest Wall:  no deformities noted Lungs:  clear bilaterally  to auscultation and percussion Heart:  egular rhythm, normal rate, no murmurs, no rubs, and no gallops.   Abdomen:  truncal obesity, soft, normal bowel sounds, no mass Msk:  no deformity or scoliosis noted with normal posture Pulses:  pulses normal Extremities:  new bilateral edema + to ++ present (never noticed edema in past) Neurologic:  CN II-XII grossly intact with normal reflexes, coordination, muscle strength and tone Skin:  intact without lesions or rashes Cervical Nodes:  no significant adenopathy Psych:  alert and cooperative; normal mood and affect; normal attention span and concentration   CXR  Procedure date:  03/14/2010  Findings:      CHEST - 2 VIEW    Comparison: 07/29/2008.    Findings: Stable normal sized heart, clear lungs and diffuse   peribronchial thickening.  The lungs remain mildly hyperexpanded.   Minimal scoliosis.    IMPRESSION:   Stable mild changes of  COPD and chronic bronchitis.  No acute   abnormality.    Read By:  Darrol Angel,  M.D.   Released By:  Darrol Angel,  M.D.  CXR  Procedure date:  03/14/2010  Findings:      looks clear.   Comments:      independently reviewed  MISC. Report  Procedure date:  03/14/2010  Findings:      creat 0.9 WC 13k hgb 10.7gm% BNP 68 pooint of care cardiac marker - negative  Comments:      independently reviewed  Pulmonary Function Test Date: 03/20/2010 11:40 AM Gender: Male  Pre-Spirometry FVC    Value: 1.45 L/min   % Pred: 41.50 % FEV1    Value: 1.14 L     Pred: 2.52 L     % Pred: 45.30 % FEV1/FVC  Value: 78.96 %     % Pred: 108.20 %  Comments: suggests restiction.  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  .  CXR March 13, 2010 is clear. No further fu  Problem # 2:  PULMONARY NODULE (ICD-518.89) Assessment: Unchanged  4mm noodule(s) in CT sept 2009. he was supposed to have 1 year ct in sept 2010. I will order one at next visit  Orders: Est. Patient Level IV (13086)  Problem # 3:  COUGH (ICD-786.2) Assessment: Deteriorated  In sept 2009 had cough due to ace inhibitor, pna and ashma. In dec 2009 he was asymptomatic. In July 2011 cough has recurrred for several months. DDx this time is asthma and gerd. I will treat for both and followup  Orders: Est. Patient Level IV (57846) HFA Instruction (96295)  Problem # 4:  SHORTNESS OF BREATH (SOB) (ICD-786.05) Assessment: Deteriorated  Dyspneic with class 2-3 levesl of exertion. HE was nearly asymptomatic in Dec 2010 but this has worsened as of July 2011. Did not desaturate with walkin in office but got dyspneic. Working out in gym not  helping him either. Suspect worsening this could be from poorly controlled asthma. However, he was not very cooperative with spirometry to enable me to make a conclusive diagnosis. TEhrefore, will Rx for ashma and review. It is very difficult to manage and approach his disesae when he is non compliant with visits and advise. I have cautioned him against it. He has promised compliance.  Will see him again in a month  Orders: Est. Patient Level IV (14782) HFA Instruction [phone])  Medications Added to Medication List This Visit: 1)  Vytorin 10-20 Mg Tabs (Ezetimibe-simvastatin) .... Take 1 tablet by mouth once a day 2)  Aggrenox 25-200 Mg Xr12h-cap (Aspirin-dipyridamole) .... Take one tablet by mouth  two times a day 3)  Metformin Hcl 500 Mg Tb24 (Metformin hcl) .Marland Kitchen.. 1  tab by mouth in the morning and 3 tablets by mouth in the evening 4)  Norvasc 10 Mg Tabs (Amlodipine besylate) .... Take 1 tablet by mouth once a day 5)  Oxybutynin Chloride 5 Mg Tabs (Oxybutynin chloride) .... Take one tablet by mouth at bedtime 6)  Victoza 18 Mg/38ml Soln (Liraglutide) .... Take 0.6 mg injection at bedtime 7)  Aleve 220 Mg Tabs (Naproxen sodium) .... Take 2 tablets by mouth once daily 8)  Vitamin D3 5000 Unit Caps (Cholecalciferol) .... Take 1 tablet by mouth once a day 9)  Osteo Bi-flex Adv Double St Tabs (Misc natural products) .... Take 1 tablet by mouth once a day 10)  Flax Seed Oil 1000 Mg Caps (Flaxseed (linseed)) .... Take 1 tablet by mouth once a day 11)  Vitamin B-12 1000 Mcg Tabs (Cyanocobalamin) .... Take 1 tablet by mouth once a day 12)  Dulera 200-5 Mcg/act Aero (Mometasone furo-formoterol fum) .... 2 puffs twice per day 13)  Dexilant 30 Mg Cpdr (Dexlansoprazole) .... Take 1 capsue qhs  Patient Instructions: 1)  please take 1 month sample dexilant - take 1 cap at bedtime 2)  take 2 samples of hiugh dose dulera 3)  take dulera 2 puff two times a day  4)  take these medicines without fail, every day,  no missing doses 5)  My nurse will teach  you technique and how to take these meds 6)  I will see you in 4-5 weeks Prescriptions: DEXILANT 30 MG CPDR (DEXLANSOPRAZOLE) take 1 capsue qhs  #30 x 1   Entered and Authorized by:   Kalman Shan MD   Signed by:   Kalman Shan MD on 03/20/2010   Method used:   Print then Give to Patient   RxID:   [id number] DULERA 200-5 MCG/ACT AERO (MOMETASONE FURO-FORMOTEROL FUM) 2 puffs twice per day  #1 x 6   Entered and Authorized by:   Kalman Shan MD   Signed by:   Kalman Shan MD on 03/20/2010   Method used:   Print then Give to Patient   RxID:   [id number]    Immunization History:  Influenza Immunization History:    Influenza:  historical (06/16/2009)  Pneumovax Immunization History:    Pneumovax:  historical (11/15/2006)     CardioPerfect Spirometry  ID: [id number] Patient: Micheal Rivers DOB: 04-Jan-1935 Age: 76 Years Old Sex: Male Race: Other Physician: Kaleena Corrow,m Height: 65 Weight: 184.50 Smoker: No Status: Unconfirmed Past Medical History:  CLL x 1998. Took rituxan 2006/2007. Followed by Dr. Patrice Paradise. Under remission per history Early sigmoid diverticula on colonoscopy 2007 Diabetes Hyperlipidemia Post viral reactive cough since 2003. Typical episodes last 2-4 weeks.  [place] in 07/2007 and had URI/"cold" and then had cough that resolved in 3 weeks (typical for him).  Normal stress test per hx - 10/2007.  CAD/MI 1998 DEcember - while in [place].  s/ coronary stent by Dr. Elease Hashimoto. EDema in legs - noted August 2009 exam. Per patient prior edema has beeon only on air travel to [place] PPD negative - 03/2008 - 04/2008 Recorded: 03/20/2010 11:40 AM  Parameter  Measured Predicted %Predicted FVC     1.45        3.50        41.50 FEV1     1.14        2.52        45.30 FEV1%   78.96        72.98        108.20 PEF    4.24        6.95        61   Interpretation:    Appended Document: [email or web address]//lmr before he  sees me he should have full pft with Tammy or at [facility]. Use standards for indian/asian  Appended Document: [email or web address]//lmr Called and LMTCB X1 pt is set up with tammy wilson for PFT on 04/07/2010 at 11:00 am  Appended Document: [email or web address]//lmr Talked to patient and is aware of the apt with tammy wilson for aug 8 at 11:00. pt states he understood

## 2010-09-29 NOTE — Assessment & Plan Note (Signed)
Summary: Cardiology Nuclear Testing  Nuclear Med Background Indications for Stress Test: Evaluation for Ischemia, Stent Patency   History: Angioplasty, Asthma, Echo, Heart Catheterization, Myocardial Perfusion Study, Stents  History Comments: 99 MPS normal 99 Stent RCA 10 Echo normal  Symptoms: DOE    Nuclear Pre-Procedure Cardiac Risk Factors: Hypertension, IDDM Type 2, Lipids, TIA Caffeine/Decaff Intake: None NPO After: 4:00 PM Lungs: Clear IV 0.9% NS with Angio Cath: 22g     IV Site: R Hand IV Started by: Cathlyn Parsons, RN Chest Size (in) 40     Height (in): 65 Weight (lb): 176 BMI: 29.39 Tech Comments: Pt took Aggrenox last on Sat. Last dose of Labetolol 12hrs ago.  Nuclear Med Study 1 or 2 day study:  1 day     Stress Test Type:  Eugenie Birks Reading MD:  Olga Millers, MD     Referring MD:  Katherina Right, MD Resting Radionuclide:  Technetium 38m Tetrofosmin     Resting Radionuclide Dose:  11.0 mCi  Stress Radionuclide:  Technetium 34m Tetrofosmin     Stress Radionuclide Dose:  33.0 mCi   Stress Protocol   Lexiscan: 0.4 mg   Stress Test Technologist:  Milana Na, EMT-P     Nuclear Technologist:  Domenic Polite, CNMT  Rest Procedure  Myocardial perfusion imaging was performed at rest 45 minutes following the intravenous administration of Technetium 71m Tetrofosmin.  Stress Procedure  The patient received IV Lexiscan 0.4 mg over 15-seconds.  Technetium 36m Tetrofosmin injected at 30-seconds. Pt experienced chest tightness with Lexiscan.  There were no significant changes with infusion.  Quantitative spect images were obtained after a 45 minute delay.  QPS Raw Data Images:  Acquisition technically good; normal left ventricular size. Stress Images:  There is decreased uptake in the distal anterior wall and apex. Rest Images:  There is decreased uptake in the apex, less prominent compared to the stress images. Transient Ischemic Dilatation:  1.04  (Normal  <1.22)  Lung/Heart Ratio:  .20  (Normal <0.45)  Quantitative Gated Spect Images QGS EDV:  101 ml QGS ESV:  40 ml QGS EF:  61 % QGS cine images:  Normal wall motion.   Overall Impression  Exercise Capacity: Lexiscan with no exercise. BP Response: Normal blood pressure response. ECG Impression: No significant ST segment change suggestive of ischemia. Overall Impression: Abnormal lexiscan nuclear study with apical thinning and mild ischemia in the distal anterior wall and apex.

## 2010-09-29 NOTE — Assessment & Plan Note (Signed)
Summary: F/U NODULE WITH FULL PFT AT 1:00/RJC   Visit Type:  Follow-up Copy to:  Dr. Charna Elizabeth Primary Provider/Referring Provider:  Dr. Reather Littler - PMD, Dr Marchelle Gearing  - Pulmonary, Dr Arty Baumgartner- GI, Dr Katherina Right - Cards, Dr Darnelle Catalan - Onc  CC:  Pt here to review CT and pft. .  History of Present Illness: BRIEF: Micheal Rivers was admitted to [facility] in April 2009 for  rt sided pneumonia; b) new asthma exacerbation physiology - admit Fev1 30%; and  c) chronic cough in setting of URI and  ACE inhibitor; ACE inhibitor stopped. At followup, he felt well and stopped all asthma inhalers.   OV 04/12/2008: returns to followup after >3 months. A month ago had cough/wheeze. Saw Dr. Darrall Dears PA for routine followup (see pmhx) and reminded to have his pna followup CT chest. This done on 03/20/2008 shows new LL Sup Segment nodules with tree in bud. However, cough/wheeze have spontanously resolved.  However, still dyspneic when taking aerobic walks  No new problems. REC: repeat CT in 2 monts  CT 05/07/2008: :LB6 tree in bud - resolved. Old stable 4mm nodules +. PPD - negative. REC: expectant followup. Rpt CT in 1 year for 4mm nodules in Sept 2010  OV 07/29/2008: Saw Dr. Shelle Iron on urgent basis for chest congestion. Was wheezing on exam. CXR - negative. REC: restart symbicort, short course prednisone  OV 08/21/2008: Feels fine. No dyspnea, congestion, wheeze, chest pain, hemoptysis, edema, chills, weight loss. Compliant with symbicort 160/4.5 but is unsure why he needs it. Asking for lower dose so started on symbicort 80/.4.5 2 puff bid. .    OV 03/20/2010: Not seen in 19 months.  STates he was doing well so he felt there was no need to see me. He did not take symbicort as advised., 6-8 months ago developed cough again. Cough made worse immediately after eating or when lying down.Cough improved by resting. Albuterol as needed does not make any difference. Cough is mostly dry. Also past 4-6 months worsening dyspnea. Now  working out at [employer] and notices dyspnea on ellipticals or treadmills after 5-10 minutes. Dyspnea is relieved by rest and albuterol inhaler. Occasional associated wheezing present and GERD present on occassoion after spicy food. He takes sample dexilant for this. HE did have worsening on 03/14/2010 and went toER. Was discharged after neb Rx on prednisone +/- abx. Currently doing better with pred but feels symptoms coming back after prednisone being tapered off. Of note, when we exerted him in office he did get very dyspneic walking 185 feet x 2 . REC: START DULERA, CONTINUE DEXLILANT, BE VERY COMPLIANT, ROV with PFT   OV April 17, 2010: Now compliant with [facility] and DExilant. In fact has been using dulera 4 puff two times a day (2 times the dose) but his technique is poor. Feels cough and "breathing" is 70% better but "dyspnea" (in his terms that is dyspnea with exertion) is unchanged or only somewhat better. No new complaints. REC: a) 1 year CT chest; b) Rx THRUSH; c) RE-EDUCATED on DULERA; d) START SINGULAIR; e) COTNINUE DEXILANT and aNTI-GERD measures     May 29, 2010: Followup Nodule, ASthma, Cough (due to Asthma & Gerd), and Dyspnea (due to Asthma and ? other causes). He has intentionally lost 7# since last visit. Also, compliant with singulair started last visit and dulera. With above, dyspnea on exertion has resolved. IN fact, he walked 185 feet x 3 laps in office wihtout problems; a  huge improvement in 2 months. CArdiac cause for dyspnea has been ruled out. HAd Cath 05/01/2010 (Dr Elease Hashimoto) that showed patent stent, normal LV and no other coronary abnormalities. IN terms of cough, he is still bothered by it. IT is UNCHANGED. This is despite weight loss, improved dietary compliance advised by Dr. Loreta Ave. Cough is dry. Moderate in severity.  Occurs mostly when he lies down to go to bed and has 5-15 episodes. Better in sitting posture. Does not bother his sleep. Happens some during day too.  Singulair and dulera and dexilant not helping cough. CT scan done 05/08/2010 shows new LLL basal segment airspace disease but he denies infectious symptoms of fever. Denies frank choking.   #ASTHMA  - numbers are best ever  - continue singulair 10mg  by mouth at bedtime   - contnue dulera 2 puff two times a day - take 2 samples #COUGH  -have the BRAVO study by Dr. Loreta Ave - #LEFT LUNG NODULE   - have CT scan chest around 07/08/2010 OV 07/10/2010: : Followup RML/RUL Nodule, ASthma, Cough (due to Asthma & Gerd) and LLL basal infiltrates. Micheal. [place] returns for fu after repeat CT  chest which shows LLL infiltrate is still persisting 2 months later. . STill bothered by cough. STarts immediately after eating but denies choking, aspiration. BRaVO study has been postponed due to his schedule issues. SPoke to Dr. Loreta Ave - she does not think patient is aspirating or has uncontrolled GERD. Other than cough, Dr. Thedore Mins feels well. Compliant with asthma meds although cost appears to be an issue and wants zafirlukast instead of singulair and wants dulera samples. . Denies fever, nausea, vomiti, diarrhea, wheeze, syncope, gerd, edema.   Preventive Screening-Counseling & Management  Alcohol-Tobacco     Smoking Status: never  Current Medications (verified): 1)  Vytorin 10-20 Mg Tabs (Ezetimibe-Simvastatin) .... Take 1 Tablet By Mouth Once A Day 2)  Tekturna 300 Mg  Tabs (Aliskiren Fumarate) .... Once Daily 3)  Labetalol Hcl 200 Mg  Tabs (Labetalol Hcl) .... 1/2 in Am and 1/2 Tablet At Bedtime 4)  Cardura 8 Mg  Tabs (Doxazosin Mesylate) .... At Bedtime 5)  Aggrenox 25-200 Mg Xr12h-Cap (Aspirin-Dipyridamole) .... Take One Tablet By Mouth  Two Times A Day 6)  Glimepiride 1 Mg Tabs (Glimepiride) .... Take 1 Tablet By Mouth Once A Day At Bedtime 7)  Actos 15 Mg  Tabs (Pioglitazone Hcl) .... Once Daily At Bedtime 8)  Metformin Hcl 500 Mg  Tb24 (Metformin Hcl) .Marland Kitchen.. 1  Tab By Mouth in The Morning and 3 Tablets By Mouth in The  Evening 9)  Indapamide 1.25 Mg  Tabs (Indapamide) .... Once Daily 10)  Norvasc 10 Mg Tabs (Amlodipine Besylate) .... Take 1 Tablet By Mouth Once A Day At Bedtime 11)  Oxybutynin Chloride 5 Mg Tabs (Oxybutynin Chloride) .... Take One Tablet By Mouth At Bedtime 12)  Victoza 18 Mg/32ml Soln (Liraglutide) .... Take 0.6 Mg Injection At Bedtime 13)  Vitamin D3 5000 Unit Caps (Cholecalciferol) .... Take 1 Tablet By Mouth Once A Day 14)  Osteo Bi-Flex Adv Double St  Tabs (Misc Natural Products) .... Take 1 Tablet By Mouth Once A Day 15)  Flax Seed Oil 1000 Mg Caps (Flaxseed (Linseed)) .... Take 1 Tablet By Mouth Once A Day 16)  Vitamin B-12 1000 Mcg Tabs (Cyanocobalamin) .... Take 1 Tablet By Mouth Once A Day 17)  Singulair 10 Mg Tabs (Montelukast Sodium) .Marland Kitchen.. 1 By Mouth At Bedtime 18)  Dulera 200-5 Mcg/act Aero (Mometasone  Furo-Formoterol Fum) .... 2 Puffs Two Times A Day 19)  Dexilant 30 Mg Cpdr (Dexlansoprazole) .Marland Kitchen.. 1 By Mouth Daily  Allergies (verified): No Known Drug Allergies  Past History:  Past medical, surgical, family and social histories (including risk factors) reviewed, and no changes noted (except as noted below).  Past Medical History: Reviewed history from 08/21/2008 and no changes required. CLL x 1998. Took rituxan 2006/2007. Followed by Dr. Patrice Paradise. Under remission per history Early sigmoid diverticula on colonoscopy 2007 Diabetes Hyperlipidemia Post viral reactive cough since 2003. Typical episodes last 2-4 weeks.  [place] in 07/2007 and had URI/"cold" and then had cough that resolved in 3 weeks (typical for him).  Normal stress test per hx - 10/2007.  CAD/MI 1998 DEcember - while in [place].  s/ coronary stent by Dr. Elease Hashimoto. EDema in legs - noted August 2009 exam. Per patient prior edema has beeon only on air travel to [place] PPD negative - 03/2008 - 04/2008  Past Surgical History: Reviewed history from 12/01/2007 and no changes required. s/p colonoscopy 2007 - sessile  hyperplastic polyp s/p coronary stent  Past Pulmonary History:  Pulmonary History: #ASTHMA  03/20/2010: Poor tech ->   Fev1 1.14L/45%, FVC 1.45L/41.5%, Ratio 79 -> start dulera  04/07/2010 Good tech - FEv1 1.79:/75%, FVC 2.4L/67% -> add singulair 05/29/2010: Fev1 1.85L/77%, Ratio 75 (69) 07/10/10: Fev1 1.80/76%, TLC 3.9/75%, DLCO 106%  Family History: Reviewed history from 12/01/2007 and no changes required. Denies asthma, copd Family History C V A / Stroke : dad, mom Family History Coronary Heart Disease: mom Family History Diabetes: dad Family History Hypertension: mom  Social History: Reviewed history from 03/20/2010 and no changes required. 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  The patient denies anorexia, fever, weight loss, weight gain, vision loss, decreased hearing, hoarseness, chest pain, syncope, dyspnea on exertion, peripheral edema, headaches, hemoptysis, abdominal pain, melena, hematochezia, severe indigestion/heartburn, hematuria, incontinence, genital sores, muscle weakness, suspicious skin lesions, transient blindness, difficulty walking, depression, unusual weight change, abnormal bleeding, enlarged lymph nodes, angioedema, breast masses, and testicular masses.    Vital Signs:  Patient profile:   76 year old male Height:      65 inches Weight:      176 pounds BMI:     29.39 O2 Sat:      94 % on Room air Temp:     98.5 degrees F oral Pulse rate:   75 / minute BP sitting:   126 / 70  (right arm) Cuff size:   regular  Vitals Entered By: Carron Curie CMA (July 10, 2010 2:03 PM)  O2 Flow:  Room air  Physical Exam  General:   male in nad. [place] like he lost some weight Head:  normocephalic and atraumatic Eyes:  PERRLA/EOM intact; conjunctiva and sclera clear Ears:  TMs intact and  clear with normal canals Nose:  no deformity, discharge, inflammation, or lesions Mouth:  no deformity or lesions. some cariesMelampatti Class III.  Thrush resolved. Neck:  no masses, thyromegaly, or abnormal cervical nodes Chest Wall:  no deformities noted Lungs:  clear bilaterally to auscultation and percussion Heart:  egular rhythm, normal rate, no murmurs, no rubs, and no gallops.   Abdomen:  truncal obesity, soft, normal bowel sounds, no mass Msk:  no deformity or scoliosis noted with  normal posture Pulses:  pulses normal Extremities:  new bilateral edema + to ++ present (never noticed edema in past) Neurologic:  CN II-XII grossly intact with normal reflexes, coordination, muscle strength and tone Skin:  intact without lesions or rashes Cervical Nodes:  no significant adenopathy Psych:  alert and cooperative; normal mood and affect; normal attention span and concentration   CT of Chest  Procedure date:  07/08/2010  Findings:      increased LLL basal infilrtative air space disease  Impression & Recommendations:  Problem # 1:  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 Discused at [facility]  after this visit o 07/16/2010 - radiology feels that CLL infitlration of lung cpould be the issue although review of cbc shows it was normal in July and patinet sawys it was normal 2 mnths ago at PMD office. [facility] recommended biopsy either via ENB or TBBX/BAL under fluorscopy approach. Discussed with patient. He wants  to wait till Jan 2012 and decided after repeat CT chest  We will also get some autoimmune labs to help with DDx  Orders: T-Angiotensin i-Converting Enzyme ([id number]) T- * Misc. Laboratory test [phone]) TLB-BNP (B-Natriuretic Peptide) (83880-BNPR) TLB-CRP-High  Sensitivity (C-Reactive Protein) (86140-FCRP) TLB-Sedimentation Rate (ESR) (85652-ESR) Est. Patient Level IV (91478) Radiology Referral (Radiology)  Problem # 2:  INTRINSIC ASTHMA, UNSPECIFIED (ICD-493.10) Assessment: Unchanged stable. Cleda Daub also shows stabiluty  plan continue dulera samples change singulair to zafirlukast due to cost monitor  Problem # 3:  PULMONARY NODULE (ICD-518.89)  Medications Added to Medication List This Visit: 1)  Labetalol Hcl 200 Mg Tabs (Labetalol hcl) .... 1/2 in am and 1/2 tablet at bedtime 2)  Glimepiride 1 Mg Tabs (Glimepiride) .... Take 1 tablet by mouth once a day at bedtime 3)  Actos 15 Mg Tabs (Pioglitazone hcl) .... Once daily at bedtime 4)  Norvasc 10 Mg Tabs (Amlodipine besylate) .... Take 1 tablet by mouth once a day at bedtime 5)  Zafirlukast 10 Mg Tabs (Zafirlukast) .Marland Kitchen.. 1 tablet daily qhs  Patient Instructions: 1)  #ASTHMA 2)   - numbers are staying good 3)   - stop singulair 4)  = start accolate  5)   - contnue dulera 2 puff two times a day - take 3 samples 6)   - show your technique again to my nurse 7)  #COUGH 8)    - sorry it is not better 9)   -#LEFT LUNG NODULE 10)    It is not better in CT and this is why you are coughing 11)  #FOLLOWUP 12)   - have blood test today 13)   - i will talk to Dr. Loreta Ave and then give you a call next week Prescriptions: ZAFIRLUKAST 10 MG TABS (ZAFIRLUKAST) 1 tablet daily qhs  #30 x 6   Entered and Authorized by:   Kalman Shan MD   Signed by:   Kalman Shan MD on 07/10/2010   Method used:   Electronically to        [facility]  [phone]* (retail)       [address] [place], [place]  [address]       Ph: [phone] or [phone]       Fax: [phone]   RxID:   [id number]   Appended Document: F/U NODULE  WITH FULL PFT AT 1:00/RJC fax to Dr. Loreta Ave and Drinda Butts also ordered ct for jan 2012. ensure it happens and then he sees me  Appended Document: F/U NODULE WITH  FULL PFT AT 1:00/RJC ov note faxed and reminder placed for CT.

## 2010-09-29 NOTE — Assessment & Plan Note (Signed)
Summary: 5-6 weeks/apc   Visit Type:  Follow-up Copy to:  Dr. Charna Elizabeth Primary Provider/Referring Provider:  Dr. Reather Littler  CC:  Pt here for follow-up. Pt states breathign hand cough has improved but it is still present. .  History of Present Illness: BRIEF: Mr Heckmann was admitted to [facility] in April 2009 for  rt sided pneumonia; b) new asthma exacerbation physiology - admit Fev1 30%; and  c) ACE inhibitor stopped due to background history of chronic cough following URI alhtough unsure if this was cause of cough. AT followup, he felt well and stopped all asthma inhalers.   OV 04/12/2008: returns to followup after >3 months. A months ago had cough/wheeze. Saw Dr. Darrall Dears PA for routine followup (see pmhx) and reminded to have his pna followup CT chest. This done on 03/20/2008 shows new LL Sup Segment nodules with tree in bud. However, cough/wheeze have spontanously resolved.  However, still dyspneic when taking aerobic walks  No new problems. REC: repeat CT in 2 monts  CT 05/07/2008: :LB6 tree in bud - resolved. Old stable 4mm nodules +. PPD - negative. REC: expectant followup. Rpt CT in 1 year for 4mm nodules in Sept 2010  OV 07/29/2008: Saw Dr. Shelle Iron on urgent basis for chest congestion. Was wheezing on exam. CXR - negative. REC: restart symbicort, short course prednisone  OV 08/21/2008: Feels fine. No dyspnea, congestion, wheeze, chest pain, hemoptysis, edema, chills, weight loss. Compliant with symbicort 160/4.5 but is unsure why he needs it. Asking for lower dose so started on symbicort 80/.4.5 2 puff bid. .    OV 03/20/2010: Not seen in 19 months. Apologized for no shows. STates he was doing well so he felt there was no need to see me. He did not take symbicort as advised 19 months ago. However, 6-8 months ago developed cough again. Cough made worse immediately after eating or when lying down.Cough improved by resting. Albuterol as needed does not make any difference to cough. Cough is  mostly dry. Also past 4-6 months worsening dyspnea. Now working out at [employer] and notices dyspnea on ellipticals or treadmills after 5-10 minutes. Dyspnea is relieved by rest and albuterol inhaler. Occasional associated wheezing present and GERD present on occassoion after spicy food. He takes sample dexilant for this. HE did have worsening on 03/14/2010 and went toER. Was discharged after neb Rx on prednisone +/- abx. Currently doing better with pred but feels symptoms coming back after prednisone being tapered off. Of note, when we exerted him in office he did get very dyspneic walking 185 feet x 2 . REC: START DULERA, CONTINUE DEXLILANT, BE VERY COMPLIANT, ROV with PFT   OV April 17, 2010: Followup ASthma, Cough due to Asthma and Gerd, and Dyspnea due to Asthma and ohter undetermined cause(s). Now compliant with Dulera and DExilant. In fact has been using dulera 4 puff two times a day (2 times the dose) but his technique is poor. Feels cough and "breathing" is 70% better but "dyspnea" (in his terms that is dyspnea with exertion) is unchanged or only somewhat better. No new complaints.    Preventive Screening-Counseling & Management  Alcohol-Tobacco     Smoking Status: never  Current Medications (verified): 1)  Vytorin 10-20 Mg Tabs (Ezetimibe-Simvastatin) .... Take 1 Tablet By Mouth Once A Day 2)  Tekturna 300 Mg  Tabs (Aliskiren Fumarate) .... Once Daily 3)  Labetalol Hcl 200 Mg  Tabs (Labetalol Hcl) .... Once Daily 4)  Cardura 8 Mg  Tabs (  Doxazosin Mesylate) .... At Bedtime 5)  Aggrenox 25-200 Mg Xr12h-Cap (Aspirin-Dipyridamole) .... Take One Tablet By Mouth  Two Times A Day 6)  Glimepiride 1 Mg Tabs (Glimepiride) .... Take 1 Tablet By Mouth Once A Day 7)  Actos 15 Mg  Tabs (Pioglitazone Hcl) .... Once Daily 8)  Metformin Hcl 500 Mg  Tb24 (Metformin Hcl) .Marland Kitchen.. 1  Tab By Mouth in The Morning and 3 Tablets By Mouth in The Evening 9)  Indapamide 1.25 Mg  Tabs (Indapamide) .... Once  Daily 10)  Norvasc 10 Mg Tabs (Amlodipine Besylate) .... Take 1 Tablet By Mouth Once A Day 11)  Oxybutynin Chloride 5 Mg Tabs (Oxybutynin Chloride) .... Take One Tablet By Mouth At Bedtime 12)  Victoza 18 Mg/11ml Soln (Liraglutide) .... Take 0.6 Mg Injection At Bedtime 13)  Aleve 220 Mg Tabs (Naproxen Sodium) .... Take 2 Tablets By Mouth Once Daily 14)  Vitamin D3 5000 Unit Caps (Cholecalciferol) .... Take 1 Tablet By Mouth Once A Day 15)  Osteo Bi-Flex Adv Double St  Tabs (Misc Natural Products) .... Take 1 Tablet By Mouth Once A Day 16)  Flax Seed Oil 1000 Mg Caps (Flaxseed (Linseed)) .... Take 1 Tablet By Mouth Once A Day 17)  Vitamin B-12 1000 Mcg Tabs (Cyanocobalamin) .... Take 1 Tablet By Mouth Once A Day 18)  Dulera 200-5 Mcg/act Aero (Mometasone Furo-Formoterol Fum) .... 2 Puffs Twice Per Day 19)  Dexilant 30 Mg Cpdr (Dexlansoprazole) .... Take 1 Capsue Qhs  Allergies (verified): No Known Drug Allergies  Past History:  Past medical, surgical, family and social histories (including risk factors) reviewed, and no changes noted (except as noted below).  Past Medical History: Reviewed history from 08/21/2008 and no changes required. CLL x 1998. Took rituxan 2006/2007. Followed by Dr. Patrice Paradise. Under remission per history Early sigmoid diverticula on colonoscopy 2007 Diabetes Hyperlipidemia Post viral reactive cough since 2003. Typical episodes last 2-4 weeks.  [place] in 07/2007 and had URI/"cold" and then had cough that resolved in 3 weeks (typical for him).  Normal stress test per hx - 10/2007.  CAD/MI 1998 DEcember - while in [place].  s/ coronary stent by Dr. Elease Hashimoto. EDema in legs - noted August 2009 exam. Per patient prior edema has beeon only on air travel to [place] PPD negative - 03/2008 - 04/2008  Past Surgical History: Reviewed history from 12/01/2007 and no changes required. s/p colonoscopy 2007 - sessile hyperplastic polyp s/p coronary stent  Past Pulmonary  History:  Pulmonary History: #ASTHMA  03/20/2010: Poor tech ->   Fev1 1.14L/45%, FVC 1.45L/41.5%, Ratio 79 -> start dulera  04/07/2010 Good tech - FEv1 1.79:/75%, FVC 2.4L/67%  Family History: Reviewed history from 12/01/2007 and no changes required. Denies asthma, copd Family History C V A / Stroke : dad, mom Family History Coronary Heart Disease: mom Family History Diabetes: dad Family History Hypertension: mom  Social History: Reviewed history from 03/20/2010 and no changes required. 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  The patient denies productive cough, coughing up blood, chest pain, irregular heartbeats, acid heartburn, indigestion, loss of appetite, weight change, abdominal pain, difficulty swallowing, sore throat, tooth/dental problems, headaches, nasal congestion/difficulty breathing through nose, sneezing, itching, ear ache, anxiety, depression, hand/feet swelling, joint stiffness or pain, rash,  change in color of mucus, and fever.    Vital Signs:  Patient profile:   76 year old male Height:      65 inches Weight:      181.38 pounds BMI:     30.29 O2 Sat:      97 % on Room air Temp:     97.8 degrees F oral Pulse rate:   63 / minute BP sitting:   110 / 70  (right arm) Cuff size:   regular  Vitals Entered By: Carron Curie CMA (April 17, 2010 9:06 AM)  O2 Flow:  Room air CC: Pt here for follow-up. Pt states breathign hand cough has improved but it is still present.  Comments Medications reviewed with patient Carron Curie CMA  April 17, 2010 9:08 AM Daytime phone number verified with patient.    Physical Exam  General:   male in nad Head:  normocephalic and atraumatic Eyes:  PERRLA/EOM intact; conjunctiva and sclera clear Ears:  TMs intact  and clear with normal canals Nose:  no deformity, discharge, inflammation, or lesions Mouth:  no deformity or lesions. some cariesMelampatti Class III.  Mild scattered thrush + Neck:  no masses, thyromegaly, or abnormal cervical nodes Chest Wall:  no deformities noted Lungs:  clear bilaterally to auscultation and percussion Heart:  egular rhythm, normal rate, no murmurs, no rubs, and no gallops.   Abdomen:  truncal obesity, soft, normal bowel sounds, no mass Msk:  no deformity or scoliosis noted with normal posture Pulses:  pulses normal Extremities:  new bilateral edema + to ++ present (never noticed edema in past) Neurologic:  CN II-XII grossly intact with normal reflexes, coordination, muscle strength and tone Skin:  intact without lesions or rashes Cervical Nodes:  no significant adenopathy Psych:  alert and cooperative; normal mood and affect; normal attention span and concentration   Impression & Recommendations:  Problem # 1:  THRUSH (ICD-112.0) Assessment New  Mild thrush from improper MDI technique, overusage of Dulera and failure to rinse mouth after dulera MDI use.  PLAN reducated on proper HFA technique and dosage and clearning measures of using listerine daily after mdi use nystatin swish and swallow x 5 days  Orders: Prescription Created Electronically [phone]) Est. Patient Level IV (60454)  Problem # 2:  INTRINSIC ASTHMA, UNSPECIFIED (ICD-493.10) Assessment: Improved FEv1 inproved after starting dulear but using in excess and technique is poor. Also, symptoms only "60-70%" better. REducated on proper technique, and dosage. Will start singulair. ROV in 6 weeks with spirometry  Problem # 3:  PULMONARY NODULE (ICD-518.89) Assessment: Unchanged  Orders: Radiology Referral (Radiology) Est. Patient Level IV (09811) HFA Instruction [phone])  4mm noodule(s) in CT sept 2009. he was supposed to have 1 year ct in sept 2010. I will order onenow  Problem # 4:  SHORTNESS  OF BREATH (SOB) (ICD-786.05) Assessment: Comment Only  will see how this responds to improved DUlera MDI technique and re-educatipn and adding singulair. If thse fail will, consinder pulmonary rehab  Orders: Est. Patient Level IV (29562)  Medications Added to Medication List This Visit: 1)  Singulair 10 Mg Tabs (Montelukast sodium) .... One by mouth daily 2)  Nystatin 100000 Unit/ml Susp (Nystatin) .... Take one teaspoonful by mouth swish & swallow four times a day x 5 days 3)  Nystatin 100000 Unit/ml Susp (Nystatin) .... Take one teaspoonful by mouth swish & swallow four times a day x 5 days  Patient Instructions: 1)  #ASTHMA 2)  your asthma is better but  you got mouth thrush 3)  take 2 more samples dulera 4)  take dulera 2 puff two times a day (not 4 puff two times a day) 5)  show your technique again to my nurse 6)  start singulair 10mg  by mouth at bedtime 7)  return in 6 weeks with spirometry 8)  #THRUSH 9)  take nystatin swish and swallow 5 time daily x 5 days 10)  after those 5 days, every time you use dulera brush your teeth and rinse your mouth with listerine 11)  #NODULE 12)  - have CT chest before return Prescriptions: NYSTATIN 100000 UNIT/ML  SUSP (NYSTATIN) Take one teaspoonful by mouth swish & swallow four times a day x 5 days  #1 x 0   Entered and Authorized by:   Kalman Shan MD   Signed by:   Kalman Shan MD on 04/17/2010   Method used:   Electronically to        [facility]  [phone]* (retail)       [address] [place], [place]  [address]       Ph: [phone] or [phone]       Fax: [phone]   RxID:   [id number] NYSTATIN 100000 UNIT/ML  SUSP (NYSTATIN) Take one teaspoonful by mouth swish & swallow four times a day x 5 days  #1 x 0   Entered and Authorized by:   Kalman Shan MD   Signed by:   Kalman Shan MD on 04/17/2010   Method used:   Electronically to        [facility]  [phone]* (retail)       [address] [place], [place]  [address]       Ph: [phone] or [phone]       Fax: [phone]   RxID:   [id number] SINGULAIR 10 MG  TABS (MONTELUKAST SODIUM) One by mouth daily  #30 x 2   Entered and Authorized by:   Kalman Shan MD   Signed by:   Kalman Shan MD on 04/17/2010   Method used:   Electronically to        [facility]  [phone]* (retail)       [address] [place], [place]  [address]       Ph: [phone] or [phone]       Fax: [phone]   RxID:   [id number]

## 2010-09-29 NOTE — Progress Notes (Signed)
Summary: HFU this wk  Phone Note Call from Patient Call back at Home Phone [phone]   Caller: Patient Call For: ramaswamy Summary of Call: pt needs HFU "this week" w/ MR. pt says dr Marchelle Gearing requested that pt see him (MR) this week. call pt at home # and leave msg.  Initial call taken by: Tivis Ringer, CNA,  March 16, 2010 10:15 AM  Follow-up for Phone Call        MR, please advise.  Per our schedule it looks like you are off this week.  Do you want pt to see you next week (if so please advise where because you are completely booked up) or do you want pt to have hosp f/u with TP.  Aundra Millet Reynolds LPN  March 16, 2010 11:05 AM   Additional Follow-up for Phone Call Additional follow up Details #1::        he went to ER with wheeze/dyspnea. Please tell him I am worknig night shifts this whole week.  just spoke to [facility] and I am having her open ofifce for me friday 7/22. I could see him special request thursday 7/21 evening at 4:30pm (I just told Tammy not to open thursday 7/21 evening for me but can do so for Dr  Thedore Mins. IF he wants to be seen sooner than that - see tammy Parrett with spirometry Additional Follow-up by: Kalman Shan MD,  March 16, 2010 5:42 PM    Additional Follow-up for Phone Call Additional follow up Details #2::    Spoke with pt and HFU sched for 03/20/10 at 10 am.  Pt okay with waiting uintil this date. Follow-up by: Vernie Murders,  March 17, 2010 10:17 AM

## 2010-09-29 NOTE — Progress Notes (Signed)
Summary: PA singulair - will await response on prior auth---approved!  Phone Note Outgoing Call Call back at [phone]   Call placed by: Boone Master CNA/MA,  April 21, 2010 5:27 PM Call placed to: [employer] Summary of Call: called spoke with [employer] to initiate PA on singulair.  form to be faxed to triage fax #.  case # [id number].  Follow-up for Phone Call        PA form recieived and placed on Dr Ramaswamy's cart to be filled out. Follow-up by: Vernie Murders,  April 22, 2010 10:38 AM  Additional Follow-up for Phone Call Additional follow up Details #1::        faxed PA form will await response from [employer]--also  gave pt 1 week supply of singulair-left at front desk and called pt to p/u--until we get response from ins. company Additional Follow-up by: Philipp Deputy CMA,  April 27, 2010 12:28 PM    Additional Follow-up for Phone Call Additional follow up Details #2::    received fax from [employer] approving pt's singulair from 04-06-2010 to 04-27-2011.  Aundra Millet Reynolds LPN  April 28, 2010 11:50 AM

## 2010-09-29 NOTE — Miscellaneous (Signed)
Summary: Orders Update pft charges  Clinical Lists Changes  Orders: Added new Service order of Spirometry (Pre & Post) (94060) - Signed 

## 2010-09-29 NOTE — Progress Notes (Signed)
Summary: peeer to peer for CT  Phone Note Outgoing Call   Summary of Call: did peer to peer - CT authorized. Good for 30 days. Number is [phone]. Pls have CT done 3 months from pripr CT - non contrast CT Initial call taken by: Kalman Shan MD,  June 19, 2010 4:55 PM  Follow-up for Phone Call        Noted and order faxed. Alfonso Ramus  June 19, 2010 5:35 PM

## 2010-09-30 ENCOUNTER — Ambulatory Visit (HOSPITAL_COMMUNITY): Payer: Medicare Other | Attending: Endocrinology

## 2010-09-30 DIAGNOSIS — R911 Solitary pulmonary nodule: Secondary | ICD-10-CM | POA: Insufficient documentation

## 2010-10-01 NOTE — Assessment & Plan Note (Signed)
Summary: 76mo rov after chest ct   Visit Type:  Follow-up Copy to:  Dr. Charna Elizabeth Primary Provider/Referring Provider:  Dr. Reather Littler - PMD, Dr Marchelle Gearing  - Pulmonary, Dr Arty Baumgartner- GI, Dr Katherina Right - Cards, Dr Darnelle Catalan - Onc  CC:  2 month follow-up after CT.  Micheal Rivers  History of Present Illness: BRIEF: Micheal Rivers was admitted to [facility] in April 2009 for  rt sided pneumonia; b) new asthma exacerbation physiology - admit Fev1 30%; and  c) chronic cough in setting of URI and  ACE inhibitor; ACE inhibitor stopped. At followup, he felt well and stopped all asthma inhalers.   OV 04/12/2008: returns to followup after >3 months. A month ago had cough/wheeze. Saw Dr. Darrall Dears PA for routine followup (see pmhx) and reminded to have his pna followup CT chest. This done on 03/20/2008 shows new LL Sup Segment nodules with tree in bud. However, cough/wheeze have spontanously resolved.  However, still dyspneic when taking aerobic walks  No new problems. REC: repeat CT in 2 monts  CT 05/07/2008: :LB6 tree in bud - resolved. Old stable 4mm nodules +. PPD - negative. REC: expectant followup. Rpt CT in 1 year for 4mm nodules in Sept 2010  OV 07/29/2008: Saw Dr. Shelle Iron on urgent basis for chest congestion. Was wheezing on exam. CXR - negative. REC: restart symbicort, short course prednisone  OV 08/21/2008: Feels fine. No dyspnea, congestion, wheeze, chest pain, hemoptysis, edema, chills, weight loss. Compliant with symbicort 160/4.5 but is unsure why he needs it. Asking for lower dose so started on symbicort 80/.4.5 2 puff bid. .    OV 03/20/2010: Not seen in 19 months.  STates he was doing well so he felt there was no need to see me. He did not take symbicort as advised., 6-8 months ago developed cough again. Cough made worse immediately after eating or when lying down.Cough improved by resting. Albuterol as needed does not make any difference. Cough is mostly dry. Also past 4-6 months worsening dyspnea. Now working out at  [employer] and notices dyspnea on ellipticals or treadmills after 5-10 minutes. Dyspnea is relieved by rest and albuterol inhaler. Occasional associated wheezing present and GERD present on occassoion after spicy food. He takes sample dexilant for this. HE did have worsening on 03/14/2010 and went toER. Was discharged after neb Rx on prednisone +/- abx. Currently doing better with pred but feels symptoms coming back after prednisone being tapered off. Of note, when we exerted him in office he did get very dyspneic walking 185 feet x 2 . REC: START DULERA, CONTINUE DEXLILANT, BE VERY COMPLIANT, ROV with PFT   OV April 17, 2010: Now compliant with [facility] and DExilant. In fact has been using dulera 4 puff two times a day (2 times the dose) but his technique is poor. Feels cough and "breathing" is 70% better but "dyspnea" (in his terms that is dyspnea with exertion) is unchanged or only somewhat better. No new complaints. REC: a) 1 year CT chest; b) Rx THRUSH; c) RE-EDUCATED on DULERA; d) START SINGULAIR; e) COTNINUE DEXILANT and aNTI-GERD measures     May 29, 2010: Followup Nodule, ASthma, Cough (due to Asthma & Gerd), and Dyspnea (due to Asthma and ? other causes). He has intentionally lost 7# since last visit. Also, compliant with singulair started last visit and dulera. With above, dyspnea on exertion has resolved. IN fact, he walked 185 feet x 3 laps in office wihtout problems; a huge improvement in  2 months. CArdiac cause for dyspnea has been ruled out. HAd Cath 05/01/2010 (Dr Elease Hashimoto) that showed patent stent, normal LV and no other coronary abnormalities. IN terms of cough, he is still bothered by it. IT is UNCHANGED. This is despite weight loss, improved dietary compliance advised by Dr. Loreta Ave. Cough is dry. Moderate in severity.  Occurs mostly when he lies down to go to bed and has 5-15 episodes. Better in sitting posture. Does not bother his sleep. Happens some during day too. Singulair and dulera  and dexilant not helping cough. CT scan done 05/08/2010 shows new LLL basal segment airspace disease but he denies infectious symptoms of fever. Denies frank choking.   #ASTHMA  - numbers are best ever  - continue singulair 10mg  by mouth at bedtime   - contnue dulera 2 puff two times a day - take 2 samples #COUGH  -have the BRAVO study by Dr. Loreta Ave - #LEFT LUNG NODULE   - have CT scan chest around 07/08/2010 OV 07/10/2010: : Followup RML/RUL Nodule, ASthma, Cough (due to Asthma & Gerd) and LLL basal infiltrates. Micheal. Reicher returns for fu after repeat CT  chest which shows LLL infiltrate is still persisting 2 months later. . STill bothered by cough. STarts immediately after eating but denies choking, aspiration. BRaVO study has been postponed due to his schedule issues. SPoke to Dr. Loreta Ave - she does not think patient is aspirating or has uncontrolled GERD. Other than cough, Dr. Thedore Mins feels well. Compliant with asthma meds although cost appears to be an issue and wants zafirlukast instead of singulair and wants dulera samples. . Denies fever, nausea, vomiti, diarrhea, wheeze, syncope, gerd, edema. REC: CONTNUE MEDS. RETURN IN 2 MONTHS AFTER CT CHEST  September 11, 2010:  Followup for Followup RML/RUL Nodule, ASthma, Cough (due to Asthma & Gerd) and LLL basal infiltrates. since last visit. No interim complaints other than high bp er visit. Now better. . Cough is improved actually. He is NOT taking dexilant and is controlling GERD through dietary measures. He had Ct chest few days ago. The LLL infiltrate hasimpoved but Dr. Fredirick Lathe the radiologist is calling for a possible esophageal obstruction. I have spoken to Dr Loreta Ave and she plans a barium swallow. She noted patient has cancelled/no show'ed for BRAVO study several times.    Preventive Screening-Counseling & Management  Alcohol-Tobacco     Smoking Status: never     Passive Smoke Exposure: no  Current Medications (verified): 1)  Vytorin 10-20 Mg Tabs  (Ezetimibe-Simvastatin) .... Take 1 Tablet By Mouth Once A Day 2)  Tekturna 300 Mg  Tabs (Aliskiren Fumarate) .... Once Daily 3)  Labetalol Hcl 200 Mg  Tabs (Labetalol Hcl) .Micheal Rivers.. 1  in Am and 1/2 Tablet At Bedtime 4)  Cardura 8 Mg  Tabs (Doxazosin Mesylate) .... At Bedtime 5)  Aggrenox 25-200 Mg Xr12h-Cap (Aspirin-Dipyridamole) .... Take One Tablet By Mouth  Two Times A Day 6)  Glimepiride 1 Mg Tabs (Glimepiride) .... Take 1 Tablet By Mouth Once A Day At Bedtime 7)  Actos 15 Mg  Tabs (Pioglitazone Hcl) .... Once Daily At Bedtime 8)  Metformin Hcl 500 Mg  Tb24 (Metformin Hcl) .Micheal Rivers.. 1  Tab By Mouth in The Morning and 3 Tablets By Mouth in The Evening 9)  Indapamide 1.25 Mg  Tabs (Indapamide) .... Once Daily 10)  Norvasc 10 Mg Tabs (Amlodipine Besylate) .... Take 1 Tablet By Mouth Once A Day At Bedtime 11)  Oxybutynin Chloride 5 Mg Tabs (Oxybutynin Chloride) .... Take  One Tablet By Mouth At Bedtime 12)  Victoza 18 Mg/58ml Soln (Liraglutide) .... Take 0.6 Mg Injection At Bedtime 13)  Vitamin D3 5000 Unit Caps (Cholecalciferol) .... Take 1 Tablet By Mouth Once A Day 14)  Osteo Bi-Flex Adv Double St  Tabs (Misc Natural Products) .... Take 1 Tablet By Mouth Once A Day 15)  Flax Seed Oil 1000 Mg Caps (Flaxseed (Linseed)) .... Take 1 Tablet By Mouth Once A Day 16)  Vitamin B-12 1000 Mcg Tabs (Cyanocobalamin) .... Take 1 Tablet By Mouth Once A Day 17)  Dulera 200-5 Mcg/act Aero (Mometasone Furo-Formoterol Fum) .... 2 Puffs Two Times A Day 18)  Dexilant 30 Mg Cpdr (Dexlansoprazole) .Micheal Rivers.. 1 By Mouth Daily 19)  Losartan Potassium 100 Mg Tabs (Losartan Potassium) .... Take 1 Tablet By Mouth Once A Day 20)  Singulair 10 Mg Tabs (Montelukast Sodium) .... Take 1 Tablet By Mouth Once A Day  Allergies (verified): No Known Drug Allergies  Past History:  Past medical, surgical, family and social histories (including risk factors) reviewed, and no changes noted (except as noted below).  Past Medical  History: Reviewed history from 08/21/2008 and no changes required. CLL x 1998. Took rituxan 2006/2007. Followed by Dr. Patrice Paradise. Under remission per history Early sigmoid diverticula on colonoscopy 2007 Diabetes Hyperlipidemia Post viral reactive cough since 2003. Typical episodes last 2-4 weeks.  [place] in 07/2007 and had URI/"cold" and then had cough that resolved in 3 weeks (typical for him).  Normal stress test per hx - 10/2007.  CAD/MI 1998 DEcember - while in [place].  s/ coronary stent by Dr. Elease Hashimoto. EDema in legs - noted August 2009 exam. Per patient prior edema has beeon only on air travel to [place] PPD negative - 03/2008 - 04/2008  Past Surgical History: Reviewed history from 12/01/2007 and no changes required. s/p colonoscopy 2007 - sessile hyperplastic polyp s/p coronary stent  Past Pulmonary History:  Pulmonary History: #ASTHMA  03/20/2010: Poor tech ->   Fev1 1.14L/45%, FVC 1.45L/41.5%, Ratio 79 -> start dulera  04/07/2010 Good tech - FEv1 1.79:/75%, FVC 2.4L/67% -> add singulair 05/29/2010: Fev1 1.85L/77%, Ratio 75 (69) 07/10/10: Fev1 1.80/76%, TLC 3.9/75%, DLCO 106%  Family History: Reviewed history from 12/01/2007 and no changes required. Denies asthma, copd Family History C V A / Stroke : dad, mom Family History Coronary Heart Disease: mom Family History Diabetes: dad Family History Hypertension: mom  Social History: Reviewed history from 03/20/2010 and no changes required. 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  The patient denies shortness of breath with activity, shortness of breath at rest, coughing up blood, chest pain, irregular heartbeats, acid heartburn, indigestion, loss of appetite, weight change, abdominal pain, difficulty swallowing, sore throat, tooth/dental  problems, headaches, nasal congestion/difficulty breathing through nose, sneezing, itching, ear ache, anxiety, depression, hand/feet swelling, joint stiffness or pain, rash, change in color of mucus, and fever.    Vital Signs:  Patient profile:   76 year old male Height:      65 inches Weight:      176.38 pounds BMI:     29.46 O2 Sat:      96 % on Room air Temp:     97.8 degrees F oral Pulse rate:   70 / minute BP sitting:   104 /  70  (right arm) Cuff size:   regular  Vitals Entered By: Carron Curie CMA (September 11, 2010 1:39 PM)  O2 Flow:  Room air CC: 2 month follow-up after CT.   Comments Medications reviewed with patient Carron Curie CMA  September 11, 2010 1:41 PM Daytime phone number verified with patient.    Physical Exam  General:   male in nad.  Head:  normocephalic and atraumatic Eyes:  PERRLA/EOM intact; conjunctiva and sclera clear Ears:  TMs intact and clear with normal canals Nose:  no deformity, discharge, inflammation, or lesions Mouth:  no deformity or lesions. some cariesMelampatti Class III.  Thrush resolved. Neck:  no masses, thyromegaly, or abnormal cervical nodes Chest Wall:  no deformities noted Lungs:  some basal crackles no distress Heart:  egular rhythm, normal rate, no murmurs, no rubs, and no gallops.   Abdomen:  truncal obesity, soft, normal bowel sounds, no mass Msk:  no deformity or scoliosis noted with normal posture Pulses:  pulses normal Extremities:  new bilateral edema + to ++ present (never noticed edema in past) Neurologic:  CN II-XII grossly intact with normal reflexes, coordination, muscle strength and tone Skin:  intact without lesions or rashes Cervical Nodes:  no significant adenopathy Psych:  alert and cooperative; normal mood and affect; normal attention span and concentration   CT of Chest  Procedure date:  09/09/2010  Findings:      IMPRESSION:   1.  Question mid esophageal wall thickening, with  proximal esophageal dilatation and retention of food material.  Clinical correlation and possible endoscopy are suggested, as esophageal carcinoma could have this appearance. 2.  Mild gastrohepatic ligament lymphadenopathy. 3.  Slight interval improvement in left lower lobe peribronchovascular nodularity and airspace consolidation. Recurrent/chronic aspiration could have this appearance, although involvement of the right lower lobe would be expected as well. Post infectious  scarring is also considered. 4.  Small bilateral pleural effusions. 5.  Decreased attenuation of the intravascular compartment is often seen with anemia.   Read By:  Reyes Ivan.,  M.D.     Released By:  Reyes Ivan.,  M.D.  Comments:      independently reviwed  Impression & Recommendations:  Problem # 1:  INTRINSIC ASTHMA, UNSPECIFIED (ICD-493.10) Assessment Unchanged  stable.   plan continue LABA + ICS change singulair to zafirlukast due to cost monitor  Problem # 2:  UNSPEC ALVEOLAR&PARIETOALVEOLAR PNEUMONOPATHY (ICD-516.9) Assessment: Improved  CT chest 05/08/2010 - NEW LLL airspace dieaseseb CT chest 07/10/2010 - this is slightly worse CT chest 09/09/2010 - LLL airspace btter and cough better but eso thinkckening and possible obstruction.   Discussed with DR Loreta Ave of GI  again. She does not think aspiration or GERD is the issue. However she  will arrange for barium swallow.  No need for bx due to improvoement. Await results of GI workup. Repeat cT in 3 months  Orders: Est. Patient Level III (16109)  Medications Added to Medication List This Visit: 1)  Labetalol Hcl 200 Mg Tabs (Labetalol hcl) .Micheal Rivers.. 1  in am and 1/2 tablet at bedtime 2)  Losartan Potassium 100 Mg Tabs (Losartan potassium) .... Take 1 tablet by mouth once a day 3)  Singulair 10 Mg Tabs (Montelukast sodium) .... Take 1 tablet by mouth once a day  Other Orders: Radiology Referral (Radiology)  Patient Instructions: 1)   I am glad patch  in left lung lower part is improving 2)  next ct chest in 3 months and return 3)  can go to [place] 4)  please take 1-2 samples of asthma meds and dexilant 5)  please talk to Dr. Loreta Ave right away and schedule your barium swallow 6)  see you in 3 months 7)  enjoy [place] when you are in [place]

## 2010-10-21 ENCOUNTER — Ambulatory Visit: Payer: Self-pay | Admitting: Cardiovascular Disease

## 2010-11-09 LAB — BASIC METABOLIC PANEL
Chloride: 93 mEq/L — ABNORMAL LOW (ref 96–112)
Creatinine, Ser: 1.02 mg/dL (ref 0.4–1.5)
Glucose, Bld: 98 mg/dL (ref 70–99)
Potassium: 4.1 mEq/L (ref 3.5–5.1)
Sodium: 130 mEq/L — ABNORMAL LOW (ref 135–145)

## 2010-11-09 LAB — CBC
HCT: 34.4 % — ABNORMAL LOW (ref 39.0–52.0)
MCH: 26 pg (ref 26.0–34.0)
RBC: 4.23 MIL/uL (ref 4.22–5.81)
RDW: 14.5 % (ref 11.5–15.5)

## 2010-11-09 LAB — PATHOLOGIST SMEAR REVIEW

## 2010-11-09 LAB — DIFFERENTIAL
Eosinophils Absolute: 0.1 10*3/uL (ref 0.0–0.7)
Eosinophils Relative: 1 % (ref 0–5)
Lymphocytes Relative: 54 % — ABNORMAL HIGH (ref 12–46)
Neutro Abs: 5.4 10*3/uL (ref 1.7–7.7)

## 2010-11-09 LAB — PROTIME-INR: INR: 0.91 (ref 0.00–1.49)

## 2010-11-09 LAB — POCT CARDIAC MARKERS
CKMB, poc: <1 ng/mL — ABNORMAL LOW (ref 1.0–8.0)
Myoglobin, poc: 100 ng/mL (ref 12–200)

## 2010-11-14 LAB — DIFFERENTIAL
Basophils Absolute: 0.1 10*3/uL (ref 0.0–0.1)
Lymphs Abs: 4.1 10*3/uL — ABNORMAL HIGH (ref 0.7–4.0)
Monocytes Absolute: 0.7 10*3/uL (ref 0.1–1.0)
Monocytes Relative: 6 % (ref 3–12)
Neutrophils Relative %: 60 % (ref 43–77)

## 2010-11-14 LAB — BASIC METABOLIC PANEL
CO2: 29 mEq/L (ref 19–32)
Calcium: 8.8 mg/dL (ref 8.4–10.5)
Creatinine, Ser: 0.91 mg/dL (ref 0.4–1.5)
Glucose, Bld: 125 mg/dL — ABNORMAL HIGH (ref 70–99)
Sodium: 130 mEq/L — ABNORMAL LOW (ref 135–145)

## 2010-11-14 LAB — CBC
MCV: 83.7 fL (ref 78.0–100.0)
Platelets: 154 10*3/uL (ref 150–400)
WBC: 13.1 10*3/uL — ABNORMAL HIGH (ref 4.0–10.5)

## 2010-11-14 LAB — POCT CARDIAC MARKERS
Myoglobin, poc: 146 ng/mL (ref 12–200)
Troponin i, poc: <0.05 ng/mL (ref 0.00–0.09)

## 2010-12-03 LAB — COMPREHENSIVE METABOLIC PANEL
ALT: 15 U/L (ref 0–53)
ALT: 16 U/L (ref 0–53)
AST: 26 U/L (ref 0–37)
Albumin: 4 g/dL (ref 3.5–5.2)
Alkaline Phosphatase: 50 U/L (ref 39–117)
CO2: 26 mEq/L (ref 19–32)
Chloride: 102 mEq/L (ref 96–112)
Creatinine, Ser: 0.98 mg/dL (ref 0.4–1.5)
GFR calc Af Amer: >60 mL/min (ref >60)
Glucose, Bld: 108 mg/dL — ABNORMAL HIGH (ref 70–99)
Potassium: 3.9 mEq/L (ref 3.5–5.1)
Potassium: 4.1 mEq/L (ref 3.5–5.1)
Sodium: 139 mEq/L (ref 135–145)
Total Protein: 5 g/dL — ABNORMAL LOW (ref 6.0–8.3)

## 2010-12-03 LAB — RETICULOCYTES
RBC.: 3.86 MIL/uL — ABNORMAL LOW (ref 4.22–5.81)
Retic Count, Absolute: 65.6 10*3/uL (ref 19.0–186.0)

## 2010-12-03 LAB — CBC
Hemoglobin: 10.6 g/dL — ABNORMAL LOW (ref 13.0–17.0)
MCV: 85.6 fL (ref 78.0–100.0)
RBC: 3.69 MIL/uL — ABNORMAL LOW (ref 4.22–5.81)
RBC: 4.19 MIL/uL — ABNORMAL LOW (ref 4.22–5.81)
RDW: 14.3 % (ref 11.5–15.5)
WBC: 11.9 10*3/uL — ABNORMAL HIGH (ref 4.0–10.5)
WBC: 12.9 10*3/uL — ABNORMAL HIGH (ref 4.0–10.5)

## 2010-12-03 LAB — CARDIAC PANEL(CRET KIN+CKTOT+MB+TROPI)
CK, MB: 1.1 ng/mL (ref 0.3–4.0)
CK, MB: 1.2 ng/mL (ref 0.3–4.0)
Total CK: 23 U/L (ref 7–232)
Total CK: 27 U/L (ref 7–232)

## 2010-12-03 LAB — URINALYSIS, ROUTINE W REFLEX MICROSCOPIC
Glucose, UA: NEGATIVE mg/dL
Hgb urine dipstick: NEGATIVE
Specific Gravity, Urine: 1.016 (ref 1.005–1.030)
Urobilinogen, UA: 0.2 mg/dL (ref 0.0–1.0)

## 2010-12-03 LAB — GLUCOSE, CAPILLARY
Glucose-Capillary: 113 mg/dL — ABNORMAL HIGH (ref 70–99)
Glucose-Capillary: 122 mg/dL — ABNORMAL HIGH (ref 70–99)
Glucose-Capillary: 87 mg/dL (ref 70–99)

## 2010-12-03 LAB — FERRITIN: Ferritin: 15 ng/mL — ABNORMAL LOW (ref 22–322)

## 2010-12-03 LAB — IRON AND TIBC: Iron: 45 ug/dL (ref 42–135)

## 2010-12-03 LAB — FOLATE: Folate: >20 ng/mL

## 2010-12-03 LAB — POCT CARDIAC MARKERS
CKMB, poc: <1 ng/mL — ABNORMAL LOW (ref 1.0–8.0)
Myoglobin, poc: 86.2 ng/mL (ref 12–200)
Troponin i, poc: <0.05 ng/mL (ref 0.00–0.09)

## 2010-12-03 LAB — HEMOGLOBIN A1C
Hgb A1c MFr Bld: 5.9 % (ref 4.6–6.1)
Mean Plasma Glucose: 123 mg/dL

## 2010-12-03 LAB — LIPID PANEL
Cholesterol: 90 mg/dL (ref 0–200)
LDL Cholesterol: 26 mg/dL (ref 0–99)
Triglycerides: 140 mg/dL (ref <150)

## 2010-12-03 LAB — HOMOCYSTEINE: Homocysteine: 13.3 umol/L (ref 4.0–15.4)

## 2010-12-03 LAB — VITAMIN B12: Vitamin B-12: 232 pg/mL (ref 211–911)

## 2011-01-05 ENCOUNTER — Inpatient Hospital Stay
Admission: RE | Admit: 2011-01-05 | Discharge: 2011-01-05 | Payer: Self-pay | Source: Ambulatory Visit | Attending: Internal Medicine | Admitting: Internal Medicine

## 2011-01-12 NOTE — Discharge Summary (Signed)
NAMEDANTRE, YEARWOOD           ACCOUNT NO.:  [id number]   MEDICAL RECORD NO.:  [id number]          PATIENT TYPE:  INP   LOCATION:  1409                         FACILITY:  [facility]   PHYSICIAN:  Kalman Shan, MD   DATE OF BIRTH:  1935/03/29   DATE OF ADMISSION:  12/01/2007  DATE OF DISCHARGE:  12/04/2007                               DISCHARGE SUMMARY   DISCHARGE DIAGNOSIS:  1. Asthmatic exacerbation complicated by community-acquired pneumonia      (no organism specified).  2. Hypertension.  3. Diabetes type 2.  4. Trace Right Pleural Effusion - expectant approach   LABORATORY DATA:  Urine Legionella negative on December 01, 2007. Urine  culture on December 01, 2007 negative. Blood cultures x2 on December 01, 2007 no  growth to date. Urine strep antigen negative.   RADIOLOGY:  Chest x-ray obtained on December 01, 2007, new small right  pleural effusion, improved aeration in the right middle and lower lobe.  CT chest obtained on December 01, 2007 showing airspace disease to the right  middle lobe and right lower lobe. Superimposed nodular irregularity.   BRIEF HISTORY:  This is a 76 year old [place] male who reported as an  initial consulted to Dr. Marchelle Gearing on December 01, 2007 with exertional  dyspnea, scratchy throat, cough described as persistent and decreased  appetite as well as low grade fever of 100 degrees. He was admitted for  formal evaluation. A chest x-ray demonstrated right lower lobe and right  middle lobe infiltrate.  He was given a working diagnosis of pneumonia.  At entry, Fev1 was 0.65L/27% in office with severe obstruction. Also,  mildly hypoxemic on room air at rest. Wheezing significantly  bilaterally.   HOSPITAL COURSE BY DISCHARGE DIAGNOSIS:  1. Asthmatic flare, in the setting of community-acquired pneumonia (no      organism specified).  Mr. Strout was admitted to the inpatient      setting, treated empirically for community-acquired pneumonia      coverage with IV Rocephin  and azithromycin.  Additionally from an      asthmatic component, he was placed on initially IV systemic      steroids, and taper.  He was treated with inhaled bronchodilators      and initially supplemental oxygen.  His symptoms continued to      improve over the course of his hospitalization. Upon time of      discharge, Fev1 improved to 1.4L/65%, he was no longer hypoxemic      and felt 500% better. He will be sent home to complete a 10-day      course of empiric antibiotics, a slow prednisone taper, and will be      initiated on a maintenance dose a Symbicort, regular nasonex with      rescue Ventolin.  He has follow-up in the outpatient setting to see      Dr. Marchelle Gearing as well as follow-up CT sinuses. Upon time of      discharge clinically he reports he is back at baseline.  2. Hypertension.  For this, he will be sent home on his baseline  regimen.  However he his ACE inhibitor was discontinued given      concern that this could be contributing to cough.  3. Background history of persistent cough following URI episodes.      Again worrisome that ACE inhibitor could have contributed to this.      Additionally, question of sinus involvement therefore will send the      patient home on Nasonex.  4. Small right pleural effusion - clinically he had improved and      effusio was too small to tap. Therefore, elected to watch it. Will      watch for resolution during next CT scan. Of note, BNP and D-Dimer      were normal at admit.  5. Cough - stopped amlodpine-benazepril   DISCHARGE INSTRUCTIONS:  Diet as tolerated.   DISCHARGE MEDICATIONS:  1. Tekturna 300 mg tab daily.  2. Labetalol 200 mg tab daily.  3. Aspirin 81 mg daily.  4. Cardura 80 mg daily.  5. Vytorin 10/80 daily.  6. Amaryl 1 mg daily.  7. Actos 15 mg daily.  8. Metformin 500 mg tab, 2 tablets twice a day.  9. Prednisone 10 mg tab, 4 tablets daily x3 days and then 3 tablets      daily x3 days then 2 tablets daily  x3 days then 1 tablet daily x3      days then discontinue.  10.Avelox 400 mg tab 1 tablet daily x6 days and then discontinue.  11.Symbicort 160/4.5, 2 puffs twice a day.  12.Ventolin HFA 2 puffs every 4 hours as needed for shortness of      breath.  13.Amlodipine-Benazepril was discontinued - at followup will address      starting him on ARB blockade and Norvasc   He has follow-up with Dr. Marchelle Gearing on Tuesday, April 21.  He was  instructed to call the office sooner should his symptoms reappear.   Over 30 minutes of time was dedicated to discharge instructions and  planning      Zenia Resides, NP      Kalman Shan, MD  Electronically Signed    PB/MEDQ  D:  12/04/2007  T:  12/04/2007  Job:  [id number]

## 2011-01-15 NOTE — Op Note (Signed)
Micheal Rivers, Micheal Rivers           ACCOUNT NO.:  [id number]   MEDICAL RECORD NO.:  [id number]          PATIENT TYPE:  AMB   LOCATION:  ENDO                         FACILITY:  [facility]   PHYSICIAN:  Anselmo Rod, M.D.  DATE OF BIRTH:  01-24-1935   DATE OF PROCEDURE:  06/13/2006  DATE OF DISCHARGE:                                 OPERATIVE REPORT   PROCEDURE:  Colonoscopy with cold biopsy x1.   ENDOSCOPIST:  Charna Elizabeth, M.D.   INSTRUMENT USED:  Olympus video colonoscope.   INDICATIONS FOR PROCEDURE:  A 76 year old [place] male with a personal  history of chronic lymphocytic leukemia underwent surveillance colonoscopy  to rule out  colonic polyps, masses, etc.   PREPROCEDURE PREPARATION:  Informed consent was procured from the patient.  The patient was fasted for four hours prior to the procedure and prepped  with a 20 OsmoPrep pills the night of and 12 OsmoPrep pills and the morning  of the procedure.  Risks and benefits of the procedure including a 10%  misread of cancer and polyps were discussed as well.   PREPROCEDURE PHYSICAL:  VITAL SIGNS:  The patient had stable vital signs.  NECK:  Supple.  CHEST:  Clear to auscultation.  CARDIAC:  S1 and S2 regular.  ABDOMEN:  Soft with normal bowel sounds.   DESCRIPTION OF PROCEDURE:  The patient was placed in the left lateral  decubitus position and sedated with 100 mcg of fentanyl and 7.5 mg of Versed  in slow incremental doses.  Once the patient was adequately sedated,  maintained on low-flow oxygen and continuous cardiac monitoring, the Olympus  video colonoscope was advanced from the rectum to the cecum.  The  appendiceal orifice and the cecum were clearly visualized and photographed.  A small sessile polyp was biopsied from the 40 cm.  Retroflexion revealed no  abnormalities.  A few early sigmoid diverticula were noted.  The patient  tolerated the procedure well without immediate complications.   IMPRESSION:  1. Small sessile  polyp biopsied (cold biopsied x1) from 40 cm.  2. A few early sigmoid diverticula.  3. Otherwise normal exam up to the terminal ileum.   RECOMMENDATIONS:  1. Await pathology results.  2. Resume all medications today.  3. Continue high fiber diet with liberal fluid intake.  4. Repeat colonoscopy depending on pathology results.  5. Outpatient followup as need arises in the future.      Anselmo Rod, M.D.  Electronically Signed     JNM/MEDQ  D:  06/13/2006  T:  06/13/2006  Job:  [id number]   cc:   Valentino Hue. Magrinat, M.D.  Reather Littler, M.D.

## 2011-03-08 ENCOUNTER — Other Ambulatory Visit: Payer: Self-pay | Admitting: Internal Medicine

## 2011-03-29 ENCOUNTER — Telehealth: Payer: Self-pay | Admitting: Cardiovascular Disease

## 2011-03-29 NOTE — Telephone Encounter (Signed)
Deb states needs Med Rec Request for cardiac cath results and stress test from 2011 or 2012, is faxing med rec release auth today

## 2011-03-30 ENCOUNTER — Telehealth: Payer: Self-pay | Admitting: Cardiovascular Disease

## 2011-04-01 ENCOUNTER — Encounter: Payer: Self-pay | Admitting: Cardiovascular Disease

## 2011-04-01 ENCOUNTER — Ambulatory Visit (INDEPENDENT_AMBULATORY_CARE_PROVIDER_SITE_OTHER): Payer: Medicare Other | Admitting: Cardiovascular Disease

## 2011-04-01 DIAGNOSIS — I2510 Atherosclerotic heart disease of native coronary artery without angina pectoris: Secondary | ICD-10-CM

## 2011-04-01 DIAGNOSIS — G459 Transient cerebral ischemic attack, unspecified: Secondary | ICD-10-CM

## 2011-04-01 NOTE — Assessment & Plan Note (Signed)
He has a history of coronary artery disease. He is status post PTCA and stenting of his left circumflex artery in December of 1998. Repeat cardiac catheterization in September, 2011 reveals a widely patent circumflex stent. He's not had any problems.  I think that he is at low risk for his upcoming neck surgery. He should be able to hold his Aggrenox for several days. I will be checking with Dr. Pearlean Brownie to get the exact number of days.

## 2011-04-01 NOTE — Assessment & Plan Note (Signed)
He's not had any recurrent strokes. He's been on Aggrenox. We will hold his Aggrenox for several days prior to his neck surgery.

## 2011-04-01 NOTE — Progress Notes (Signed)
Micheal Rivers Date of Birth  1934-12-03 [facility] / [facility] [address]     [address] [place], [place]  [address] [phone]  Fax  [phone]  History of Present Illness:  Micheal Rivers is a 76 yo male with history of coronary artery disease. He is status post PTCA and stenting of his proximal left circumflex artery. He's had a TIA and was placed on Aggrenox by Dr. Pearlean Brownie. He's not had any recurrent episodes of stroke or TIA.  He has a history of diabetes mellitus and has been managed by Dr. Lucianne Muss. He also has a history of chronic lymphocytic leukemia.  He recently had a neck injury. He was found to have disc problems in the C5 and C6 region.  He was to have fusion of 2 of the cervical spine vertebra.  The orthopedic doctors wanted him cleared for surgery and also asked how long to hold Aggrenox.  He's not had any episodes of chest pain or shortness of breath.  Current Outpatient Prescriptions  Medication Sig Dispense Refill  . acetaminophen (TYLENOL) 650 MG suppository Place 650 mg rectally every 4 (four) hours as needed.        Marland Kitchen aliskiren (TEKTURNA) 300 MG tablet Take by mouth daily. 0.5 tab Once a Day       . amLODipine (NORVASC) 5 MG tablet Take 5 mg by mouth daily.        Marland Kitchen dexlansoprazole (DEXILANT) 60 MG capsule Take 60 mg by mouth as needed.        . dipyridamole-aspirin (AGGRENOX) 25-200 MG per 12 hr capsule Take 1 capsule by mouth 2 (two) times daily.        Marland Kitchen doxazosin (CARDURA) 8 MG tablet Take 8 mg by mouth at bedtime.        Marland Kitchen ezetimibe-simvastatin (VYTORIN) 10-20 MG per tablet Take 1 tablet by mouth at bedtime.        Marland Kitchen glimepiride (AMARYL) 1 MG tablet Take 1 mg by mouth daily before breakfast.        . glucose blood test strip 1 each by Other route as needed. Use as instructed       . hydrochlorothiazide (HYDRODIURIL) 12.5 MG tablet Take 12.5 mg by mouth daily.        Marland Kitchen labetalol (NORMODYNE) 200 MG tablet Take 200 mg by mouth. 0.5  tab am and 1 tab pm       . Liraglutide (VICTOZA) 18 MG/3ML SOLN Inject into the skin.        Marland Kitchen losartan (COZAAR) 100 MG tablet Take 100 mg by mouth daily.        Marland Kitchen METFORMIN HCL PO Take by mouth. 1 tab in am, 3 tab in pm       . Mometasone Furo-Formoterol Fum (DULERA IN) Inhale into the lungs daily. 2 puff       . pioglitazone (ACTOS) 15 MG tablet Take 15 mg by mouth daily.        . Vitamin D, Ergocalciferol, (DRISDOL) 50000 UNITS CAPS Take 50,000 Units by mouth.        . zafirlukast (ACCOLATE) 10 MG tablet TAKE 1 TABLET AT BEDTIME  30 tablet  0    No Known Allergies  Past Medical History  Diagnosis Date  . Coronary artery disease     Post PTCA and stenting of a Circimflex marginal artery in 1999  . Diabetes mellitus   . Hyperlipidemia     Hypercholesterolemia  . History of recurrent  TIAs     DR. Sethi - started Aggranox  . Lymphocytic leukemia     Chronic  . Hypertension   . Ischemia     Recent abnormal stress Cardiolite study. He appears to have mild ischemia at the apex.  His followup heart catheterization revealed no significant coronary artery.    Past Surgical History  Procedure Date  . US echocardiography 06-09-2009    EF 60-65%  . Leg surgery     As a child  . Hemorroidectomy [id number]    History  Smoking status  . Never Smoker   Smokeless tobacco  . Not on file    History  Alcohol Use No    Family History  Problem Relation Age of Onset  . Hypertension Mother   . Hypertension Father   . Stroke Father   . Diabetes Father     Reviw of Systems:  Reviewed in the HPI.  All other systems are negative.  Physical Exam: BP 146/62  Pulse 66  Ht 5\' 5"  (1.651 m)  Wt 178 lb 9.6 oz (81.012 kg)  BMI 29.72 kg/m2 The patient is alert and oriented x 3.  The mood and affect are normal.   Skin: warm and dry.  Color is normal.    HEENT:   the sclera are nonicteric.  The mucous membranes are moist.  The carotids are 2+ without bruits.  There is no thyromegaly.   There is no JVD.    Lungs: clear.  The chest wall is non tender.    Heart: regular rate with a normal S1 and S2.  There are no murmurs, gallops, or rubs. The PMI is not displaced.     Abdomen: good bowel sounds.  There is no guarding or rebound.  There is no hepatosplenomegaly or tenderness.  There are no masses.   Extremities:  no clubbing, cyanosis, or edema.  The legs are without rashes.  The distal pulses are intact.   Neuro:  Cranial nerves II - XII are intact.  Motor and sensory functions are intact.    The gait is normal.   Assessment / Plan:

## 2011-04-02 ENCOUNTER — Encounter: Payer: Self-pay | Admitting: Cardiovascular Disease

## 2011-04-02 ENCOUNTER — Telehealth: Payer: Self-pay | Admitting: *Deleted

## 2011-04-02 NOTE — Telephone Encounter (Signed)
Dr Ian Bushman heard from dr Pearlean Brownie and he said to stop aggrenox for 5 days prior to surg, [facility] was faxed and new information was called to [facility] and a msg was left stating dr Pearlean Brownie said 5 days. Pt was called and informed,Pt verbalized understanding. Alfonso Ramus RN

## 2011-04-10 ENCOUNTER — Other Ambulatory Visit: Payer: Self-pay | Admitting: Internal Medicine

## 2011-05-11 ENCOUNTER — Other Ambulatory Visit: Payer: Self-pay | Admitting: Internal Medicine

## 2011-05-25 LAB — HEMOGLOBIN A1C
Hgb A1c MFr Bld: 6.4 — ABNORMAL HIGH
Mean Plasma Glucose: 151

## 2011-05-25 LAB — HEPATIC FUNCTION PANEL
AST: 28
Bilirubin, Direct: 0.2
Total Bilirubin: 0.8

## 2011-05-25 LAB — CBC
HCT: 31.1 — ABNORMAL LOW
Hemoglobin: 10.8 — ABNORMAL LOW
MCHC: 34.9
RDW: 13.7

## 2011-05-25 LAB — LACTIC ACID, PLASMA: Lactic Acid, Venous: 1.6

## 2011-05-25 LAB — CARDIAC PANEL(CRET KIN+CKTOT+MB+TROPI)
CK, MB: 6.8 — ABNORMAL HIGH
CK, MB: 6.8 — ABNORMAL HIGH
Relative Index: INVALID
Total CK: 68
Total CK: 78
Troponin I: 0.01

## 2011-05-25 LAB — URINALYSIS, ROUTINE W REFLEX MICROSCOPIC
Bilirubin Urine: NEGATIVE
Glucose, UA: NEGATIVE
Hgb urine dipstick: NEGATIVE
Nitrite: NEGATIVE
pH: 6

## 2011-05-25 LAB — BASIC METABOLIC PANEL
BUN: 14
CO2: 31
Chloride: 90 — ABNORMAL LOW
Glucose, Bld: 101 — ABNORMAL HIGH
Potassium: 3.5
Sodium: 130 — ABNORMAL LOW

## 2011-05-25 LAB — BLOOD GAS, ARTERIAL
O2 Saturation: 88.3
Patient temperature: 98.6
TCO2: 26.6
pH, Arterial: 7.451 — ABNORMAL HIGH

## 2011-05-25 LAB — CULTURE, BLOOD (ROUTINE X 2)
Culture: NO GROWTH
Culture: NO GROWTH

## 2011-05-25 LAB — DIFFERENTIAL
Basophils Absolute: 0
Basophils Relative: 0
Eosinophils Absolute: 0.1
Eosinophils Relative: 1
Monocytes Absolute: 1

## 2011-05-25 LAB — LIPASE, BLOOD: Lipase: 39

## 2011-05-25 LAB — B-NATRIURETIC PEPTIDE (CONVERTED LAB): Pro B Natriuretic peptide (BNP): 42.7

## 2011-05-25 LAB — LACTATE DEHYDROGENASE: LDH: 148

## 2011-05-25 LAB — URINE CULTURE: Colony Count: NO GROWTH

## 2011-05-25 LAB — LEGIONELLA ANTIGEN, URINE

## 2011-05-25 LAB — STREP PNEUMONIAE URINARY ANTIGEN: Strep Pneumo Urinary Antigen: NEGATIVE

## 2011-05-26 ENCOUNTER — Encounter (INDEPENDENT_AMBULATORY_CARE_PROVIDER_SITE_OTHER): Payer: Medicare Other | Admitting: Ophthalmology

## 2011-05-26 DIAGNOSIS — H43819 Vitreous degeneration, unspecified eye: Secondary | ICD-10-CM

## 2011-05-26 DIAGNOSIS — H3530 Unspecified macular degeneration: Secondary | ICD-10-CM

## 2011-06-08 ENCOUNTER — Other Ambulatory Visit: Payer: Self-pay | Admitting: Internal Medicine

## 2011-06-21 ENCOUNTER — Telehealth: Payer: Self-pay | Admitting: Internal Medicine

## 2011-06-21 DIAGNOSIS — J984 Other disorders of lung: Secondary | ICD-10-CM

## 2011-06-21 NOTE — Telephone Encounter (Signed)
Per pt's instructions from last OV with MR on 09/11/10:  Patient Instructions: 1)  I am glad patch  in left lung lower part is improving 2)  next ct chest in 3 months and return 3)  can go to [place] 4)  please take 1-2 samples of asthma meds and dexilant 5)  please talk to Dr. Loreta Ave right away and schedule your barium swallow 6)  see you in 3 months 7)  enjoy [place] when you are in [place]   Called, spoke with pt.  He has a follow up scheduled with MR on Nov 1.  He is aware the last OV with MR was in Jan and would like to know if MR still wants him to have the scan done prior to the Nov 1 OV.  MR, pls advise - would you like pt to have a Ct Chest done prior to seeing you on Nov 1?  If so, would you like it with or without contrast?

## 2011-06-24 NOTE — Telephone Encounter (Signed)
Ideally yes, he needs ct chest without contrast before seeing me. He now lives [place] area so best to schedule on same day of visit before seeing me. If he is reluctant to have scan then it is choice, he can come see me and we can discuss

## 2011-06-24 NOTE — Telephone Encounter (Signed)
Called and spoke with pt.  Pt agreed to have CT prior to his appt with MR on Nov 1 but would like this done before 10:30 am as he has a class to be at that day.  Sent order to [facility] who will call pt to schedule CT.

## 2011-07-01 ENCOUNTER — Encounter: Payer: Self-pay | Admitting: Internal Medicine

## 2011-07-01 ENCOUNTER — Ambulatory Visit (INDEPENDENT_AMBULATORY_CARE_PROVIDER_SITE_OTHER): Payer: Medicare Other | Admitting: Internal Medicine

## 2011-07-01 ENCOUNTER — Ambulatory Visit (INDEPENDENT_AMBULATORY_CARE_PROVIDER_SITE_OTHER)
Admission: RE | Admit: 2011-07-01 | Discharge: 2011-07-01 | Disposition: A | Discharge status: Home / Self Care | Payer: Medicare Other | Source: Ambulatory Visit | Attending: Internal Medicine | Admitting: Internal Medicine

## 2011-07-01 VITALS — BP 138/52 | HR 57 | Temp 98.6°F | Ht 65.0 in | Wt 183.0 lb

## 2011-07-01 DIAGNOSIS — B370 Candidal stomatitis: Secondary | ICD-10-CM

## 2011-07-01 DIAGNOSIS — R059 Cough, unspecified: Secondary | ICD-10-CM

## 2011-07-01 DIAGNOSIS — J8409 Other alveolar and parieto-alveolar conditions: Secondary | ICD-10-CM

## 2011-07-01 DIAGNOSIS — J45909 Unspecified asthma, uncomplicated: Secondary | ICD-10-CM

## 2011-07-01 DIAGNOSIS — J984 Other disorders of lung: Secondary | ICD-10-CM

## 2011-07-01 DIAGNOSIS — R05 Cough: Secondary | ICD-10-CM

## 2011-07-01 MED ORDER — NYSTATIN 100000 UNIT/ML MT SUSP: 500000.0000 [IU] | Freq: Four times a day (QID) | OROMUCOSAL | Status: AC

## 2011-07-01 NOTE — Patient Instructions (Signed)
#  asthma  - clinically better  - stop dulera - try QVAR 2 puff twice daily scheduled and albuterol as needed; take samples and show technique - glad you had flu shot # Oral thrush - For Oral thrush: Take Suspension (swish and swallow): 500,000 units 4 times/day for 5 days; swish in the mouth and retain for as long as possible (several minutes) before swallowing #pulmonary infiltrates  - CT shows these are cleared; no more ct needed #lung nodules  - these are stable since 2009; no more ct needed #followup  - 6 months or sooner if needed

## 2011-07-01 NOTE — Progress Notes (Signed)
Subjective:    Patient ID: Micheal Rivers, male    DOB: Sep 15, 1934, 75 y.o.   MRN: [id number]  HPI BRIEF: Micheal Rivers was admitted to [facility] in April 2009 for rt sided pneumonia; b) new asthma exacerbation physiology - admit Fev1 30%; and c) chronic cough in setting of URI and ACE inhibitor; ACE inhibitor stopped. At followup, he felt well and stopped all asthma inhalers.   OV 04/12/2008: returns to followup after >3 months. A month ago had cough/wheeze. Saw Dr. Darrall Dears PA for routine followup (see pmhx) and reminded to have his pna followup CT chest. This done on 03/20/2008 shows new LL Sup Segment nodules with tree in bud. However, cough/wheeze have spontanously resolved. However, still dyspneic when taking aerobic walks No new problems. REC: repeat CT in 2 monts   CT 05/07/2008: :LB6 tree in bud - resolved. Old stable 4mm nodules +. PPD - negative. REC: expectant followup. Rpt CT in 1 year for 4mm nodules in Sept 2010  OV 07/29/2008: Saw Dr. Shelle Iron on urgent basis for chest congestion. Was wheezing on exam. CXR - negative. REC: restart symbicort, short course prednisone   OV 08/21/2008: Feels fine. No dyspnea, congestion, wheeze, chest pain, hemoptysis, edema, chills, weight loss. Compliant with symbicort 160/4.5 but is unsure why he needs it. Asking for lower dose so started on symbicort 80/.4.5 2 puff bid. .   OV 03/20/2010: Not seen in 19 months. STates he was doing well so he felt there was no need to see me. He did not take symbicort as advised., 6-8 months ago developed cough again. Cough made worse immediately after eating or when lying down.Cough improved by resting. Albuterol as needed does not make any difference. Cough is mostly dry. Also past 4-6 months worsening dyspnea. Now working out at [employer] and notices dyspnea on ellipticals or treadmills after 5-10 minutes. Dyspnea is relieved by rest and albuterol inhaler. Occasional associated wheezing present and GERD present on occassoion  after spicy food. He takes sample dexilant for this. HE did have worsening on   03/14/2010 and went toER. Was discharged after neb Rx on prednisone +/- abx. Currently doing better with pred but feels symptoms coming back after prednisone being tapered off. Of note, when we exerted him in office he did get very dyspneic walking 185 feet x 2 . REC: START DULERA, CONTINUE DEXLILANT, BE VERY COMPLIANT, ROV with PFT  OV April 17, 2010: Now compliant with [facility] and DExilant. In fact has been using dulera 4 puff two times a day (2 times the dose) but his technique is poor. Feels cough and "breathing" is 70% better but "dyspnea" (in his terms that is dyspnea with exertion) is unchanged or only somewhat better. No new complaints. REC: a) 1 year CT chest; b) Rx THRUSH; c) RE-EDUCATED on DULERA; d) START SINGULAIR; e) COTNINUE DEXILANT and aNTI-GERD measures   May 29, 2010: Followup Nodule, ASthma, Cough (due to Asthma & Gerd), and Dyspnea (due to Asthma and ? other causes). He has intentionally lost 7# since last visit. Also, compliant with singulair started last visit and dulera. With above, dyspnea on exertion has resolved. IN fact, he walked 185 feet x 3 laps in office wihtout problems; a huge improvement in 2 months. CArdiac cause for dyspnea has been ruled out. HAd Cath 05/01/2010 (Dr Elease Hashimoto) that showed patent stent, normal LV and no other coronary abnormalities. IN terms of cough, he is still bothered by it. IT is UNCHANGED. This is despite weight loss, improved  dietary compliance advised by Dr. Loreta Ave. Cough is dry. Moderate in severity. Occurs mostly when he lies down to go to bed and has 5-15 episodes. Better in sitting posture. Does not bother his sleep. Happens some during day too. Singulair and dulera and dexilant not helping cough. CT scan done 05/08/2010 shows new LLL basal segment airspace disease but he denies infectious symptoms of fever. Denies frank choking.  #ASTHMA  - numbers are best ever  -  continue singulair 10mg  by mouth at bedtime  - contnue dulera 2 puff two times a day - take 2 samples  #COUGH  -have the BRAVO study by Dr. Loreta Ave -  #LEFT LUNG NODULE  - have CT scan chest around 07/08/2010   OV 07/10/2010: : Followup RML/RUL Nodule, ASthma, Cough (due to Asthma & Gerd) and LLL basal infiltrates. Micheal Rivers returns for fu after repeat CT chest which shows LLL infiltrate is still persisting 2 months later. . STill bothered by cough. STarts immediately after eating but denies choking, aspiration. BRaVO study has been postponed due to his schedule issues. SPoke to Dr. Loreta Ave - she does not think patient is aspirating or has uncontrolled GERD. Other than cough, Dr. Thedore Mins feels well. Compliant with asthma meds although cost appears to be an issue and wants zafirlukast instead of singulair and wants dulera samples. . Denies fever, nausea, vomiti, diarrhea, wheeze, syncope, gerd, edema. REC: CONTNUE MEDS. RETURN IN 2 MONTHS AFTER CT CHEST   September 11, 2010: Followup for Followup RML/RUL Nodule, ASthma, Cough (due to Asthma & Gerd) and LLL basal infiltrates. since last visit. No interim complaints other than high bp er visit. Now better. . Cough is improved actually. He is NOT taking dexilant and is controlling GERD through dietary measures. He had Ct chest few days ago. The LLL infiltrate hasimpoved but Dr. Fredirick Lathe the radiologist is calling for a possible esophageal obstruction. I have spoken to Dr Loreta Ave and she plans a barium swallow. She noted patient has cancelled/no show'ed for BRAVO study several times REC 1) I am glad patch in left lung lower part is improving  2) next ct chest in 3 months and return  3) can go to [place]  4) please take 1-2 samples of asthma meds and dexilant  5) please talk to Dr. Loreta Ave right away and schedule your barium swallow  6) see you in 3 months  7) enjoy [place] when you are in [place]   OV 07/01/11: Followup for Followup RML/RUL Nodule, ASthma, Cough (due  to Asthma & Gerd) and LLL basal infiltrates. Last seen Jan 2012. Feels well. No major issues. Cough has resolved. . Feels asthma under control. Continues with dulera but  reports hoarseness. CT today shows LLL infitlrate resolved since Jan 2012. Lung nodules unchanged (5mm) since 2009  Past, Social and Family: Return from [place] in April and then C-spine herniation nerve pain. S/p surgery in [place]. Now well and pain free. Living in [place] now.       Review of Systems  Constitutional: Negative for fever and unexpected weight change.  HENT: Negative for ear pain, nosebleeds, congestion, sore throat, rhinorrhea, sneezing, trouble swallowing, dental problem, postnasal drip and sinus pressure.   Eyes: Negative for redness and itching.  Respiratory: Negative for cough, chest tightness, shortness of breath and wheezing.   Cardiovascular: Negative for palpitations and leg swelling.  Gastrointestinal: Negative for nausea and vomiting.  Genitourinary: Negative for dysuria.  Musculoskeletal: Negative for joint swelling.  Skin: Negative for rash.  Neurological:  Negative for headaches.  Hematological: Does not bruise/bleed easily.  Psychiatric/Behavioral: Negative for dysphoric mood. The patient is not nervous/anxious.        Objective:   Physical Exam  General: male in nad.  Head: normocephalic and atraumatic  Eyes: PERRLA/EOM intact; conjunctiva and sclera clear  Ears: TMs intact and clear with normal canals  Nose: no deformity, discharge, inflammation, or lesions  Mouth: no deformity or lesions. some cariesMelampatti Class III. Thrush + (MILD) Neck: no masses, thyromegaly, or abnormal cervical nodes  Chest Wall: no deformities noted  Lungs: some basal crackles  no distress  Heart: egular rhythm, normal rate, no murmurs, no rubs, and no gallops.  Abdomen: truncal obesity, soft, normal bowel sounds, no mass  Msk: no deformity or scoliosis noted with normal posture  Pulses: pulses normal    Extremities: new bilateral edema + to ++ present (never noticed edema in past)  Neurologic: CN II-XII grossly intact with normal reflexes, coordination, muscle strength and tone  Skin: intact without lesions or rashes  Cervical Nodes: no significant adenopathy  Psych: alert and cooperative; normal mood and affect; normal attention span and concentration       Assessment & Plan:

## 2011-07-03 NOTE — Assessment & Plan Note (Signed)
#  lung nodules  - these are stable since 2009; no more ct needed #followup  - 6 months or sooner if needed

## 2011-07-03 NOTE — Assessment & Plan Note (Signed)
Has recurred again. HE is less compliant with mouth wash  PLAN Nystatin swish and swallow Change mdi to qvar Rinsing emphasized

## 2011-07-03 NOTE — Assessment & Plan Note (Signed)
resolved 

## 2011-07-03 NOTE — Assessment & Plan Note (Signed)
#  pulmonary infiltrates  - CT shows these are cleared; no more ct needed

## 2011-07-03 NOTE — Assessment & Plan Note (Signed)
#  asthma  - clinically better  - stop dulera - try QVAR 2 puff twice daily scheduled and albuterol as needed; take samples and show technique - glad you had flu shot

## 2011-07-09 ENCOUNTER — Other Ambulatory Visit: Payer: Self-pay | Admitting: Internal Medicine

## 2011-07-20 ENCOUNTER — Telehealth: Payer: Self-pay | Admitting: Internal Medicine

## 2011-07-20 NOTE — Telephone Encounter (Signed)
I spoke with MR and he stated cough could be from the QVAR or could be bc the qvar is lowered strength than dulera. Pt to stop qvar and go back to dulera. Pt to call if cough is worse in the next couple of days. -Pt is aware to stop the qvar and to call if cough is worse in the next couple of days. Pt aware to rinse mouth out well after using inhaler. Pt verbalized understanding and had no questions

## 2011-07-20 NOTE — Telephone Encounter (Signed)
I spoke with pt and he states on 07/01/11 he was advised to stop the dulera and start QVAR 80 2 puffs BID. Pt states every since he has started the QVAR he has been coughing. Pt states it's a dry cough. Pt is wanting to know does he need to stop the qvar and go back on dulera. Pt is wanting a answer today. Since Dr. Marchelle Gearing has left for the day will forward to Dr. Kriste Basque for recs. Please advise Dr. Kriste Basque, thanks

## 2011-07-26 ENCOUNTER — Telehealth: Payer: Self-pay | Admitting: Internal Medicine

## 2011-07-26 NOTE — Telephone Encounter (Signed)
Pt c/o prod cough in the mornings, mucus is white to brown in color. This clears up throughout the course of the day. He says the cough is not worse but still present and worse in the mornings and at bedtime. He does not want recs from any other physician and says he will wait on a response from MR and is aware he is out of the office. He is also wanting to know if MR has any recs for a pulmonologist in or near [place], [place] or at [facility] since this is where he is at. Pt will not speak with or see another physician in our office.

## 2011-07-28 NOTE — Telephone Encounter (Signed)
Difficult to sort out if this is asthma acting up or acid reflux. If not gone away he should be seen.   The main group in the area is  . I do not know any one myself but this is what I found oout in internet  1) [facility]  [email or web address]  [facility] [address], [address] [place], [place] [address] Entrance located next to the [facility] [phone]  The problem is that there are only 3 docs there and they are in [place] and last I heard through the grapevine was that they are solidly booked out and very busy clinic. So, it might be a while before he gets an appt. So, he should try early and get appt. Good group I hear  2) [email or web address] [facility] [address], [address] [place], [place] [address] [phone]   3) Dr Jolayne Panther [email or web address] - Pulmonology - [facility] [address], [address] [place], [place] [address] Appointments: [phone]  Please let him know that I would call him myself but currently not in position  Youl could mail this to him by letter if you think is hard for him to note all of it

## 2011-07-28 NOTE — Telephone Encounter (Signed)
I spoke with the patient and relayed all of the information regarding pulmonary physicians in his area. The patient states he is better and did not need an appointment.

## 2011-08-11 ENCOUNTER — Other Ambulatory Visit: Payer: Self-pay | Admitting: Internal Medicine

## 2011-08-19 ENCOUNTER — Telehealth: Payer: Self-pay | Admitting: Cardiovascular Disease

## 2011-08-19 NOTE — Telephone Encounter (Signed)
All Cardiac faxed to [facility] @ [phone] 08/19/11/KM

## 2011-09-07 ENCOUNTER — Telehealth: Payer: Self-pay | Admitting: Internal Medicine

## 2011-09-07 NOTE — Telephone Encounter (Signed)
Got letter from [facility] sayiung that zafirlukast and trazodone can increase neurppysch side effects. He should talk about it with pulmonary at [facility] or pmd there. You can send them the note about this too

## 2011-09-21 NOTE — Telephone Encounter (Signed)
Pt PCP aware.Micheal Rivers, CMA

## 2011-10-06 NOTE — Telephone Encounter (Signed)
Enc closed

## 2011-11-24 ENCOUNTER — Ambulatory Visit (INDEPENDENT_AMBULATORY_CARE_PROVIDER_SITE_OTHER): Payer: Medicare Other | Admitting: Ophthalmology

## 2012-04-03 ENCOUNTER — Ambulatory Visit (INDEPENDENT_AMBULATORY_CARE_PROVIDER_SITE_OTHER): Payer: Self-pay | Admitting: Ophthalmology

## 2012-08-11 ENCOUNTER — Other Ambulatory Visit: Payer: Self-pay | Admitting: Internal Medicine

## 2012-10-05 IMAGING — CT CT CHEST W/O CM
2 of 5 series · 9 of 36 positions shown, 11 images · non-contrast
Comparison: 05/08/2010

CLINICAL DATA: Cough.  History of chronic lymphocytic leukemia.

CT CHEST WITHOUT CONTRAST
TECHNIQUE: Multidetector CT imaging of the chest was performed
following the standard protocol without IV contrast.

[Series 5: high res lung 1x10 · axial · 0.66mm/px · z∈[-214,-24]mm · 6 of 27 slices shown, 8 images]
[im 4/27  mediastinal]
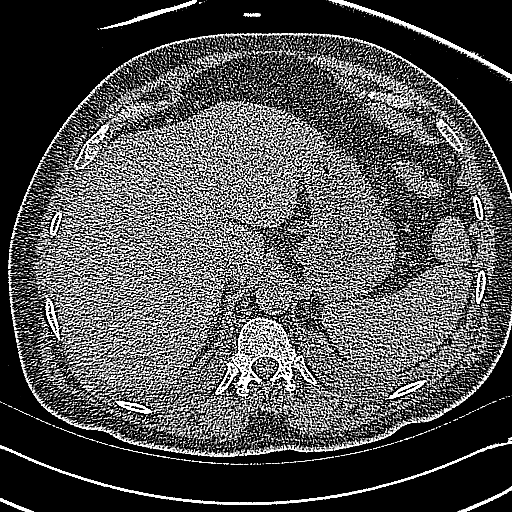
[im 4/27  lung]
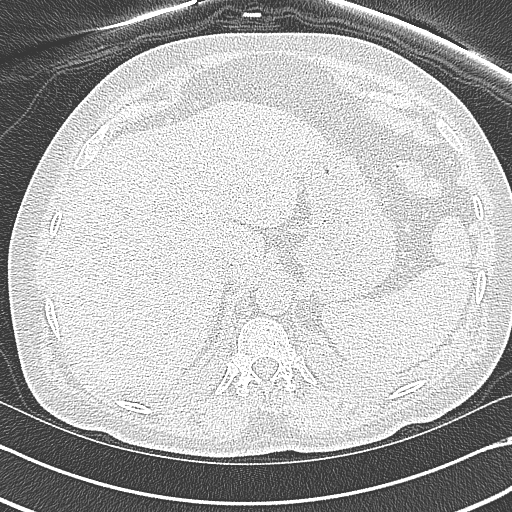
[im 8/27  lung]
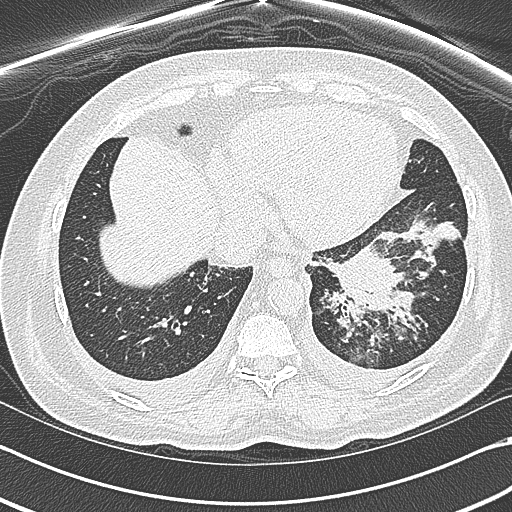
[im 12/27  lung]
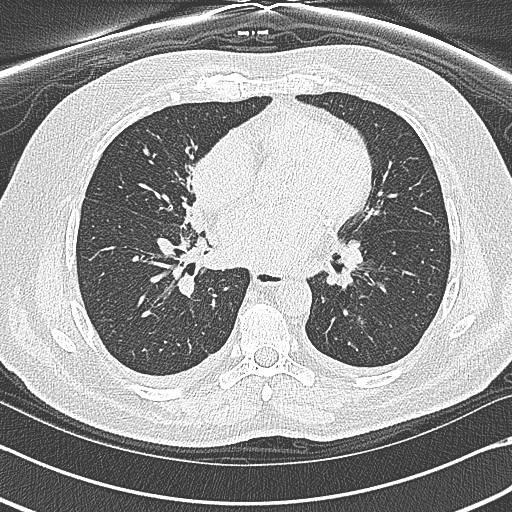
[im 15/27  lung]
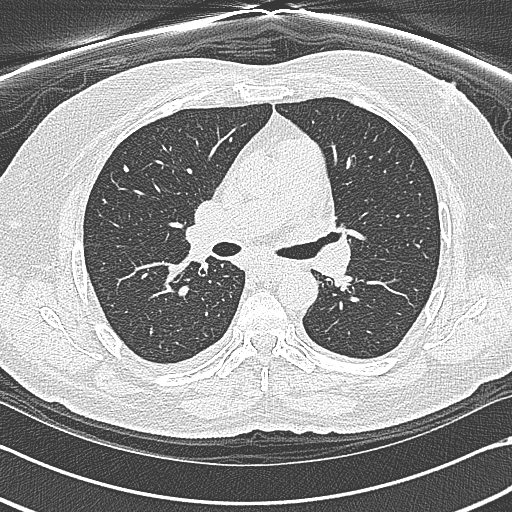
[im 19/27  mediastinal]
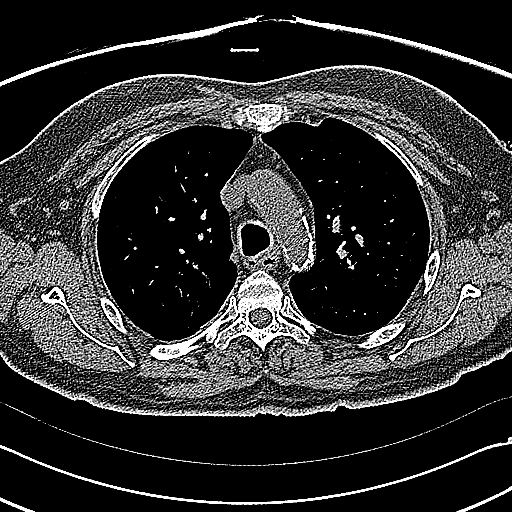
[im 19/27  lung]
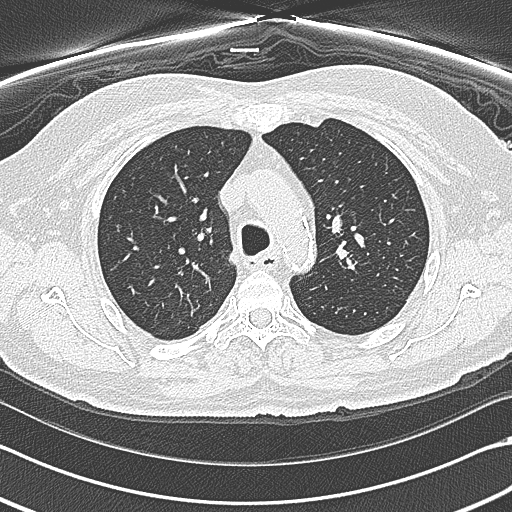
[im 23/27  lung]
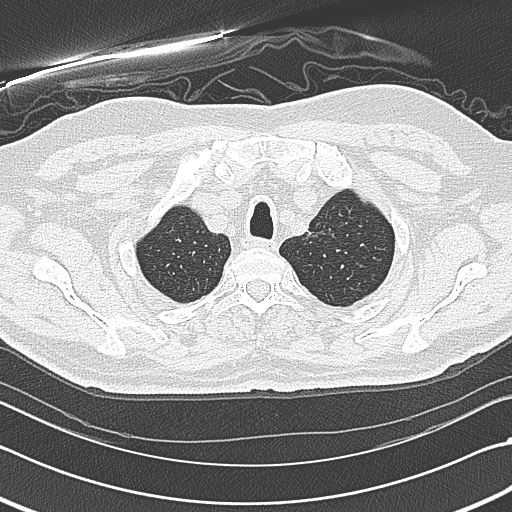

[Series 602: <mpr thick range> · coronal · 0.66mm/px · 3 of 128 slices shown]
[im 26/128  lung]
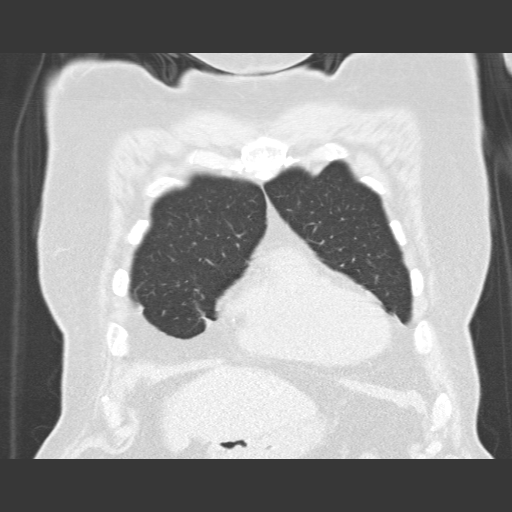
[im 51/128  lung]
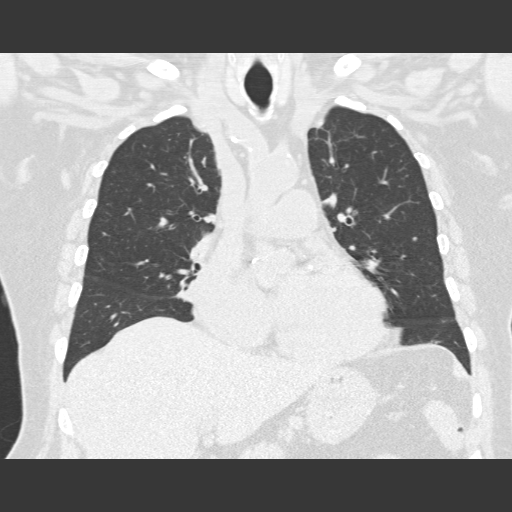
[im 77/128  lung]
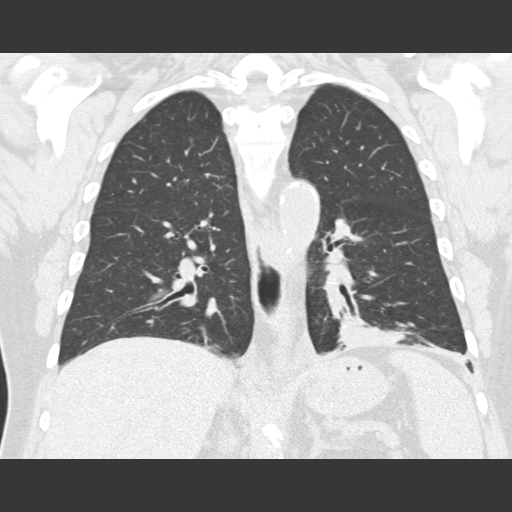

[9 of 36 positions shown; findings below may reference images not displayed]

FINDINGS: High-resolution CT images demonstrate no CT evidence for
interstitial lung disease.  Central bronchial wall thickening is
again noted, with areas of predominately left lower lobe tree in
bud nodular opacity.

Heart size is normal.  Coronary arterial calcifications are noted.
Small mediastinal lymph nodes.  Atherosclerotic aortic
calcification without visualized aneurysm.  Trace pleural
effusions.  No pericardial effusion.  Confluent left lower lobe
consolidation is increased.  Scattered bilateral sub centimeter
pulmonary nodules are stable.

No acute osseous abnormality.
IMPRESSION: Increased consolidation in the left lower lobe with underlying tree
in bud type nodular opacities and central bronchial wall
thickening.  Although  this could represent sequela of small
airways infection, the history of leukemia raises the question of
malignant infiltrate.

Stable pulmonary nodules bilaterally.

## 2013-05-15 ENCOUNTER — Other Ambulatory Visit: Payer: Self-pay | Admitting: *Deleted

## 2013-05-15 ENCOUNTER — Other Ambulatory Visit: Payer: Self-pay | Admitting: Endocrinology

## 2013-05-15 DIAGNOSIS — E119 Type 2 diabetes mellitus without complications: Secondary | ICD-10-CM | POA: Insufficient documentation

## 2013-05-15 DIAGNOSIS — E7800 Pure hypercholesterolemia, unspecified: Secondary | ICD-10-CM

## 2013-05-15 DIAGNOSIS — E785 Hyperlipidemia, unspecified: Secondary | ICD-10-CM | POA: Insufficient documentation

## 2013-05-22 ENCOUNTER — Ambulatory Visit: Payer: Medicare Other | Admitting: Endocrinology

## 2013-05-22 ENCOUNTER — Other Ambulatory Visit: Payer: Medicare Other

## 2013-06-19 ENCOUNTER — Telehealth: Payer: Self-pay | Admitting: Endocrinology

## 2013-06-19 NOTE — Telephone Encounter (Signed)
In reviewing appts for tomorrow, it appears pt initially no showed lab and follow up visit. When no showed appt was rescheduled, the lab appt did not get scheduled. Does pt need to have labs before his follow? Please let me know and we can r/s tomorrow's follow up and get labs scheduled / Sherri S.

## 2013-06-19 NOTE — Telephone Encounter (Signed)
Same-day lab since he comes from [facility]

## 2013-06-20 ENCOUNTER — Encounter: Payer: Self-pay | Admitting: Endocrinology

## 2013-06-20 ENCOUNTER — Ambulatory Visit (INDEPENDENT_AMBULATORY_CARE_PROVIDER_SITE_OTHER): Payer: Medicare Other | Admitting: Endocrinology

## 2013-06-20 VITALS — BP 138/60 | HR 60 | Temp 98.4°F | Resp 12 | Ht 65.0 in | Wt 180.2 lb

## 2013-06-20 DIAGNOSIS — Z856 Personal history of leukemia: Secondary | ICD-10-CM

## 2013-06-20 DIAGNOSIS — E785 Hyperlipidemia, unspecified: Secondary | ICD-10-CM

## 2013-06-20 DIAGNOSIS — E119 Type 2 diabetes mellitus without complications: Secondary | ICD-10-CM

## 2013-06-20 LAB — CBC WITH DIFFERENTIAL/PLATELET
Basophils Absolute: 0 10*3/uL (ref 0.0–0.1)
Basophils Relative: 0.3 % (ref 0.0–3.0)
Eosinophils Absolute: 0.2 10*3/uL (ref 0.0–0.7)
Eosinophils Relative: 1.2 % (ref 0.0–5.0)
HCT: 35.7 % — ABNORMAL LOW (ref 39.0–52.0)
Lymphocytes Relative: 40.1 % (ref 12.0–46.0)
MCV: 82.2 fl (ref 78.0–100.0)
Monocytes Absolute: 0.8 10*3/uL (ref 0.1–1.0)
Neutrophils Relative %: 52.8 % (ref 43.0–77.0)
Platelets: 170 10*3/uL (ref 150.0–400.0)
RBC: 4.34 Mil/uL (ref 4.22–5.81)
RDW: 14.5 % (ref 11.5–14.6)
WBC: 13.5 10*3/uL — ABNORMAL HIGH (ref 4.5–10.5)

## 2013-06-20 LAB — COMPREHENSIVE METABOLIC PANEL
ALT: 20 U/L (ref 0–53)
Albumin: 3.9 g/dL (ref 3.5–5.2)
Alkaline Phosphatase: 79 U/L (ref 39–117)
BUN: 15 mg/dL (ref 6–23)
CO2: 32 mEq/L (ref 19–32)
Calcium: 9.2 mg/dL (ref 8.4–10.5)
Chloride: 98 mEq/L (ref 96–112)
GFR: 104.32 mL/min (ref >60.00)
Sodium: 140 mEq/L (ref 135–145)
Total Protein: 6.1 g/dL (ref 6.0–8.3)

## 2013-06-20 LAB — URINALYSIS, ROUTINE W REFLEX MICROSCOPIC
Hgb urine dipstick: NEGATIVE
Leukocytes, UA: NEGATIVE
Nitrite: NEGATIVE
Specific Gravity, Urine: 1.01 (ref 1.000–1.030)
Urine Glucose: NEGATIVE
Urobilinogen, UA: 0.2 (ref 0.0–1.0)

## 2013-06-20 LAB — LIPID PANEL
Cholesterol: 122 mg/dL (ref 0–200)
Triglycerides: 52 mg/dL (ref 0.0–149.0)

## 2013-06-20 LAB — MICROALBUMIN / CREATININE URINE RATIO
Creatinine,U: 27.3 mg/dL
Microalb, Ur: 0.5 mg/dL (ref 0.0–1.9)

## 2013-06-20 LAB — HEMOGLOBIN A1C: Hgb A1c MFr Bld: 6.6 % — ABNORMAL HIGH (ref 4.6–6.5)

## 2013-06-20 NOTE — Progress Notes (Signed)
Patient ID: Micheal Rivers, male   DOB: [birth year], 78 y.o.   MRN: [id number]  Micheal Rivers is an 78 y.o. male.   Reason for Appointment: Diabetes follow-up   History of Present Illness   Diagnosis: Type 2 DIABETES MELITUS, date of diagnosis: 1992      Previous history: He has been on various oral hypoglycemic drugs over the years, usually with good control Because of difficulty with weight loss he was also started on Victoza a few years ago. He seems to be doing fairly well with 0.6 mg and prefers not to increase the dose because of cost. His A1c is usually upper normal  Recent history: He has had occasional low sugars at night and has moved his Amaryl to the morning for about 6 weeks. He thinks his blood sugars did not change in the morning but has not checked his blood sugar tomorrow months since he does not have monitor     Oral hypoglycemic drugs: Amaryl, Actos, metformin        Side effects from medications: None Proper timing of medications in relation to meals: Yes.          Monitors blood glucose: Once a day.    Glucometer:  Accu-Chek Blood Glucose readings not available for the last month, previously about 90 no sign in the morning Hypoglycemia frequency:  once about 6 weeks ago at night        Meals: 3 meals per day.          Physical activity: exercise: Off and on, usually trying to use an exercise bike or walk 3 days a week            Complications: are:   none  The last HbgA1c report is not available  Wt Readings from Last 3 Encounters:  06/20/13 180 lb 3.2 oz (81.738 kg)  07/01/11 183 lb (83.008 kg)  04/01/11 178 lb 9.6 oz (81.012 kg)       Medication List       This list is accurate as of: 06/20/13 10:10 AM.  Always use your most recent med list.               aliskiren 300 MG tablet  Commonly known as:  TEKTURNA  Take by mouth daily. 0.5 tab Once a Day     amLODipine 5 MG tablet  Commonly known as:  NORVASC  Take 5 mg by mouth daily.     DEXILANT 60 MG capsule  Generic drug:  dexlansoprazole  Take 60 mg by mouth as needed.     dipyridamole-aspirin 200-25 MG per 12 hr capsule  Commonly known as:  AGGRENOX  Take 1 capsule by mouth 2 (two) times daily.     doxazosin 8 MG tablet  Commonly known as:  CARDURA  Take 8 mg by mouth at bedtime.     DULERA IN  Inhale 2 puffs into the lungs daily. 2 puff     ezetimibe-simvastatin 10-20 MG per tablet  Commonly known as:  VYTORIN  Take 1 tablet by mouth at bedtime.     glimepiride 1 MG tablet  Commonly known as:  AMARYL  Take 1 mg by mouth daily before breakfast.     glucose blood test strip  1 each by Other route as needed. Use as instructed     hydrochlorothiazide 12.5 MG tablet  Commonly known as:  HYDRODIURIL  Take 12.5 mg by mouth daily.     labetalol 200 MG tablet  Commonly  known as:  NORMODYNE  Take 200 mg by mouth. 0.5 tab am and 1 tab pm     losartan 100 MG tablet  Commonly known as:  COZAAR  Take 100 mg by mouth daily.     METFORMIN HCL PO  Take by mouth. 1 tab in am, 3 tab in pm     pioglitazone 15 MG tablet  Commonly known as:  ACTOS  Take 15 mg by mouth daily.     VICTOZA 18 MG/3ML Soln injection  Generic drug:  Liraglutide  Inject into the skin.     zafirlukast 10 MG tablet  Commonly known as:  ACCOLATE  TAKE 1 TABLET AT BEDTIME        Allergies: No Known Allergies  Past Medical History  Diagnosis Date  . Coronary artery disease     Post PTCA and stenting of a Circimflex marginal artery in 1999  . Diabetes mellitus   . Hyperlipidemia     Hypercholesterolemia  . History of recurrent TIAs     DR. Sethi - started Aggranox  . Lymphocytic leukemia     Chronic  . Hypertension   . Ischemia     Recent abnormal stress Cardiolite study. He appears to have mild ischemia at the apex.  His followup heart catheterization revealed no significant coronary artery.    Past Surgical History  Procedure Laterality Date  . US echocardiography   06-09-2009    EF 60-65%  . Leg surgery      As a child  . Hemorroidectomy  [phone]    Family History  Problem Relation Age of Onset  . Hypertension Mother   . Hypertension Father   . Stroke Father   . Diabetes Father     Social History:  reports that he has never smoked. He does not have any smokeless tobacco history on file. He reports that he does not drink alcohol. His drug history is not on file.  Review of Systems:  Hypertension: He has had hypertension since 1991 and taking multiple drugs. Has not required a change in drugs for some time. Home systolic readings are 125-150 usually, no lightheadedness  Lipids: He has had diabetic dyslipidemia and now taking Lipitor and niacin. Previously was on 500 mg twice a day of niacin but his PCP told him to take it only once a day  History of CLL: Has not required any treatment for several years and also has had mild stable anemia      Examination:   BP 138/60  Pulse 60  Temp(Src) 98.4 F (36.9 C)  Resp 12  Ht 5\' 5"  (1.651 m)  Wt 180 lb 3.2 oz (81.738 kg)  BMI 29.99 kg/m2  SpO2 95%  Body mass index is 29.99 kg/(m^2).    ASSESSMENT/ PLAN::   Diabetes type 2   Blood glucose control was difficult to assess as he has not checked his readings in the mornings and A1c not available Currently not having any hypoglycemia even with changing his Amaryl to the morning Will give him a new monitor today and have him check blood sugars about once a day at various times  Hypertension: Well controlled, continue same regimen; check urine microalbumin  LIPIDS: Will check his lipid panel and lipoprotein profile  Micheal Rivers 06/20/2013, 10:10 AM

## 2013-06-21 ENCOUNTER — Other Ambulatory Visit: Payer: Self-pay | Admitting: *Deleted

## 2013-06-21 MED ORDER — GLUCOSE BLOOD VI STRP: ORAL_STRIP | Status: DC

## 2013-06-21 NOTE — Patient Instructions (Signed)
Please check blood sugars at least half the time about 2 hours after any meal and as directed on waking up. Please bring blood sugar monitor to each visit  No change in medications as yet

## 2013-06-22 LAB — LIPOPROTEIN ANALYSIS BY NMR
HDL Particle Number: 32.6 umol/L (ref >=30.5)
LDL Particle Number: 750 nmol/L (ref <1000)
LP-IR Score: <25 (ref <=45)

## 2013-07-05 ENCOUNTER — Other Ambulatory Visit: Payer: Self-pay

## 2013-10-22 ENCOUNTER — Ambulatory Visit (INDEPENDENT_AMBULATORY_CARE_PROVIDER_SITE_OTHER): Payer: Medicare HMO | Admitting: Endocrinology

## 2013-10-22 ENCOUNTER — Encounter: Payer: Self-pay | Admitting: Endocrinology

## 2013-10-22 ENCOUNTER — Other Ambulatory Visit (INDEPENDENT_AMBULATORY_CARE_PROVIDER_SITE_OTHER): Payer: Medicare HMO

## 2013-10-22 ENCOUNTER — Other Ambulatory Visit: Payer: Self-pay | Admitting: *Deleted

## 2013-10-22 VITALS — BP 128/60 | HR 60 | Temp 98.2°F | Resp 14 | Ht 65.0 in | Wt 180.2 lb

## 2013-10-22 DIAGNOSIS — E119 Type 2 diabetes mellitus without complications: Secondary | ICD-10-CM

## 2013-10-22 DIAGNOSIS — I10 Essential (primary) hypertension: Secondary | ICD-10-CM

## 2013-10-22 DIAGNOSIS — Z0000 Encounter for general adult medical examination without abnormal findings: Secondary | ICD-10-CM

## 2013-10-22 DIAGNOSIS — E785 Hyperlipidemia, unspecified: Secondary | ICD-10-CM

## 2013-10-22 DIAGNOSIS — Z856 Personal history of leukemia: Secondary | ICD-10-CM

## 2013-10-22 LAB — BASIC METABOLIC PANEL
BUN: 20 mg/dL (ref 6–23)
CHLORIDE: 99 meq/L (ref 96–112)
CO2: 28 mEq/L (ref 19–32)
Calcium: 9.6 mg/dL (ref 8.4–10.5)
Creatinine, Ser: 0.9 mg/dL (ref 0.4–1.5)
GFR: 92.99 mL/min (ref >60.00)
Glucose, Bld: 97 mg/dL (ref 70–99)
Potassium: 3.9 mEq/L (ref 3.5–5.1)
SODIUM: 138 meq/L (ref 135–145)

## 2013-10-22 LAB — HEMOGLOBIN A1C: Hgb A1c MFr Bld: 6.2 % (ref 4.6–6.5)

## 2013-10-22 MED ORDER — GLIMEPIRIDE 1 MG PO TABS: 1.0000 mg | ORAL_TABLET | Freq: Every day | ORAL | Status: DC

## 2013-10-22 NOTE — Progress Notes (Signed)
Patient ID: Micheal Rivers, male   DOB: [birth year], 79 y.o.   MRN: [id number]   Reason for Appointment: Diabetes follow-up   History of Present Illness   Diagnosis: Type 2 DIABETES MELITUS, date of diagnosis: 1992      Previous history:  Oral agents started in 1993; Glucophage added in 1997 and Actos in 2/05.  Had gradual weight gain with Actos and possibly had edema and now is taking only 15 mg.    Because of difficulty with weight loss he was also started on Victoza a few years ago. He seems to be doing fairly well with 0.6 mg and prefers not to increase the dose because of cost. His A1c is usually upper normal  Recent history: Still taking Amaryl in the morning which appears to be keeping his blood sugars fairly stable and reducing nocturnal hypoglycemia. Has been taking 1 mg recently, previously taking 0.5 mg and blood sugar was higher in the morning on 2/1 in 2/2, probably increased the dose at that time  Occasionally will have relatively high readings after his evening meal; also may have slightly higher reading fasting if eating popcorn or other snacks at night Has had one episode of hypoglycemia on 09/24/13 at 1:30 a.m. with glucose 52 Also compliant with Victoza, Actos and metformin without side effects.      Oral hypoglycemic drugs: Amaryl, Actos, metformin        Side effects from medications: None Proper timing of medications in relation to meals: Yes.          Monitors blood glucose:  twice a day   Glucometer: One Touch Blood Glucose readings  PREMEAL Breakfast Lunch Dinner Bedtime Overall  Glucose range:  99-144    94     median:  113      122    POST-MEAL PC Breakfast PC Lunch PC Dinner  Glucose range:  102-132   105-171   124-194   Mean/median:      Hypoglycemia: Recently none     Meals: 3 meals per day. Eggs only at breakfast Diet is usually good, mostly vegetarian and small portions with only one roti.Does not eat much protein at lunch or dinner        Physical  activity: exercise:   usually trying to use an exercise bike or walk 3 days a week            Complications: are:   none    Wt Readings from Last 3 Encounters:  10/22/13 180 lb 3.2 oz (81.738 kg)  06/20/13 180 lb 3.2 oz (81.738 kg)  07/01/11 183 lb (83.008 kg)    Lab Results  Component Value Date   HGBA1C 6.6* 06/20/2013   HGBA1C  Value: 5.9 (NOTE) The ADA recommends the following therapeutic goal for glycemic control related to Hgb A1c measurement: Goal of therapy: <6.5 Hgb A1c  Reference: American Diabetes Association: Clinical Practice Recommendations 2010, Diabetes Care, 2010, 33: (Suppl  1). 06/08/2009   HGBA1C  Value: 6.4 (NOTE)   The ADA recommends the following therapeutic goals for glycemic   control related to Hgb A1C measurement:   Goal of Therapy:   < 7.0% Hgb A1C   Action Suggested:  > 8.0% Hgb A1C   Ref:  Diabetes Care, 22, Suppl. 1, 1999* 12/02/2007   Lab Results  Component Value Date   MICROALBUR 0.5 06/20/2013   LDLCALC 59 06/20/2013   CREATININE 0.8 06/20/2013    PREVENTIVE Care:   Annual hemoccults:  yes   Lipid panel                             05/2013   Colonoscopy/sigmoidoscopy: ?2013  Flu vaccine Yearly  Zostavax:   2011   Pneumovax: 2014  Eye exams:  12/14   DRE:   2013     Diet: Usually low fat Exercise: Using exercise bike at home regularly Antiplatelet drugs: Aspirin and Plavix History of falls: Negative     Medication List       This list is accurate as of: 10/22/13 10:12 AM.  Always use your most recent med list.               albuterol (5 MG/ML) 0.5% nebulizer solution  Commonly known as:  PROVENTIL  Take 2.5 mg by nebulization every 6 (six) hours as needed for wheezing.     amLODipine 5 MG tablet  Commonly known as:  NORVASC  Take 5 mg by mouth daily.     aspirin 81 MG tablet  Take 81 mg by mouth daily.     atorvastatin 80 MG tablet  Commonly known as:  LIPITOR  Take 80 mg by mouth daily.     BIOFLEX Tabs  Take by  mouth.     clopidogrel 75 MG tablet  Commonly known as:  PLAVIX  Take 75 mg by mouth daily.     co-enzyme Q-10 30 MG capsule  Take 30 mg by mouth 3 (three) times daily.     DEXILANT 60 MG capsule  Generic drug:  dexlansoprazole  Take 60 mg by mouth as needed.     doxazosin 8 MG tablet  Commonly known as:  CARDURA  Take 8 mg by mouth at bedtime.     DULERA IN  Inhale 2 puffs into the lungs daily. 2 puff     furosemide 20 MG tablet  Commonly known as:  LASIX  Take 20 mg by mouth.     glimepiride 1 MG tablet  Commonly known as:  AMARYL  Take 1 mg by mouth daily before breakfast.     glucose blood test strip  Commonly known as:  ONETOUCH VERIO  Use as instructed to check blood sugars 3 times per day dx code 250.00     hydrALAZINE 50 MG tablet  Commonly known as:  APRESOLINE  Take 50 mg by mouth 3 (three) times daily.     iron polysaccharides 150 MG capsule  Commonly known as:  NIFEREX  Take 150 mg by mouth daily.     loratadine 10 MG tablet  Commonly known as:  CLARITIN  Take 10 mg by mouth daily.     METFORMIN HCL PO  Take by mouth. 1 tab in am, 3 tab in pm     metoprolol succinate 50 MG 24 hr tablet  Commonly known as:  TOPROL-XL  Take 50 mg by mouth daily. Take with or immediately following a meal.     niacin 500 MG tablet  Commonly known as:  SLO-NIACIN  Take 500 mg by mouth at bedtime.     pioglitazone 15 MG tablet  Commonly known as:  ACTOS  Take 15 mg by mouth daily.     PRESERVISION AREDS 2 PO  Take by mouth.     VICTOZA 18 MG/3ML Soln injection  Generic drug:  Liraglutide  Inject 0.6 mg into the skin.     vitamin B-12 1000 MCG tablet  Commonly known  as:  CYANOCOBALAMIN  Take 1,000 mcg by mouth daily.     Vitamin D3 5000 UNITS Tabs  Take by mouth.     zafirlukast 10 MG tablet  Commonly known as:  ACCOLATE  TAKE 1 TABLET AT BEDTIME        Allergies: No Known Allergies  Past Medical History  Diagnosis Date  . Coronary artery  disease     Post PTCA and stenting of a Circimflex marginal artery in 1999  . Diabetes mellitus   . Hyperlipidemia     Hypercholesterolemia  . History of recurrent TIAs     DR. Sethi - started Aggranox  . Lymphocytic leukemia     Chronic  . Hypertension   . Ischemia     Recent abnormal stress Cardiolite study. He appears to have mild ischemia at the apex.  His followup heart catheterization revealed no significant coronary artery.    Past Surgical History  Procedure Laterality Date  . US echocardiography  06-09-2009    EF 60-65%  . Leg surgery      As a child  . Hemorroidectomy  [phone]    Family History  Problem Relation Age of Onset  . Hypertension Mother   . Hypertension Father   . Stroke Father   . Diabetes Father     Social History:  reports that he has never smoked. He does not have any smokeless tobacco history on file. He reports that he does not drink alcohol. His drug history is not on file.  Review of Systems:  Hypertension: He has had hypertension since 1991 and taking multiple drugs. His blood pressure is usually  normal here in the office but relatively higher with other physicians. He tends to have mostly high readings at home and is not monitoring now. Readings at other physician's offices has been about 093-267 systolic. No lightheadedness on standing up Has not required a change in drugs for some time.   Lipids: He has had diabetic dyslipidemia with baseline LDL particle number of 1213 Currently taking 80 mg Lipitor and niacin. Previously was on 500 mg twice a day of niacin but his PCP told him to take it only once a day  Lab Results  Component Value Date   CHOL 122 06/20/2013   HDL 52.60 06/20/2013   LDLCALC 59 06/20/2013   TRIG 52.0 06/20/2013   CHOLHDL 2 06/20/2013    History of CLL presenting as lymphadenopathy and gradually increasing WBC countL: Has not required any treatment for several years and WBC is now 15,000. Previously treated with  Rituxan from Dr. Jana Hakim.  Chemotherapy stopped in 3/05. No unusual fatigue or night sweats  Hemoglobin is significantly improved with recent iron infusion, previously had used Aranesp.      Eyes: Has had exams every 6 months; no recent problems.  No unusual headaches.     ENT: No symptoms    Skin: No rash or lesions     Thyroid:  No cold or heat intolerance.          No chest pain on exertion.   Stent  in coronary artery was done in  ? 1/14              No palpitations.   Followed by cardiologist regularly.     Leg edema: This is well-controlled. He has been told to take Lasix 20 mg daily and also extra 20 mg 3 days a week     No leg pain in calves on walking.  He does get somewhat shortness of breath on significant exertion. Cough better recently, has been treated with multiple drugs for reactive airway disease and is still taking albuterol with nebulizer     Bowel habits:  Normal, normal stools.           Heartburn: only if eating spicy food.         Urinary stream: normal.  Nocturia: 3 times which is chronic.  No hesitancy or urgency.      No joint pains.      No numbness or tingling in hands or feet; he thinks he has mild weakness in left leg.         Examination:   BP 128/60  Pulse 60  Temp(Src) 98.2 F (36.8 C)  Resp 14  Ht 5\' 5"  (1.651 m)  Wt 180 lb 3.2 oz (81.738 kg)  BMI 29.99 kg/m2  SpO2 97%  Body mass index is 29.99 kg/(m^2).   Repeat blood pressure 126/52 and standing 126/50  GENERAL:  Has mild abdominal obesity.  No pallor, clubbing and no ankle edema.   Skin:  normal.   Multiple pigmented moles on trunk  EYES:  Externally normal.  Fundii:  normal discs and vessels bilaterally  ENT: .  Oral cavity, tongue, auricular canals & pharynx normal.  NECK: no cervical adenopathy or other mass  THYROID:  Not palpable.  CAROTIDS:  Normal character; no bruit.  HEART:  Normal S1 and S2; no click or murmur.  CHEST:  Lungs:  Vescicular breath sounds  heard equally.  No wheeze. Minimal left base crepitations  ABDOMEN:  No distention.  Liver and spleen not palpable.  No other mass or tenderness. No abnormal pulsation   RECTAL: Normal exam,  Prostate:  No nodules felt; only minimal smooth enlargement    NEUROLOGICAL:  Reflexes are absent bilaterally at ankles.  Vibration moderately decreased in toes bilaterally.   PERIPHERAL PULSES: As in foot exam   ASSESSMENT/ PLAN:   Diabetes type 2   Blood glucose control appears excellent with mostly normal readings at home and only occasional mild increase in postprandial or fasting readings Discussed with patient that with his duration of diabetes, age and concomitant medical issues we do not need to aggressively control his diabetes especially with tendency to overnight hypoglycemia with evening doses of Amaryl He will continue the same regimen for now and check more readings after evening meal He will try to continue exercise as much as possible and follow a low carbohydrate diet  Diabetic complications: None at present  Hypertension: Well controlled without orthostatic symptoms. However would like to reduce his doxazosin to half a tablet to avoid any orthostatic drop in blood pressure overnight when he is getting up to go to the bathroom several times; does not have prostate enlargement requiring this drug  LIPIDS: Has had diabetic dyslipidemia with increased LDL particle number mostly. Well controlled as of 05/2013, to have NMR lipoprotein panel on the next visit to check particle number. Meanwhile continue maximum dose atorvastatin  History of CAD: Asymptomatic now and followed by cardiologist.  History of asthma, symptomatically better now without any recent change in treatment. Advised that he can take his albuterol as needed only  Nocturia: This is chronic in etiology unclear. Does not have any significant BPH, consider urology consultation if worse  History of leg edema: Taking Lasix  for this from cardiologist  PREVENTIVE care:  He is up-to-date with all preventive screening and immunizations. Will have him do Hemoccults  today  Micheal Rivers 10/22/2013, 10:12 AM

## 2013-10-22 NOTE — Patient Instructions (Signed)
Reduce Doxazosin to 1/2  

## 2013-10-31 ENCOUNTER — Other Ambulatory Visit: Payer: Self-pay | Admitting: *Deleted

## 2013-10-31 ENCOUNTER — Telehealth: Payer: Self-pay | Admitting: Endocrinology

## 2013-10-31 MED ORDER — GLIMEPIRIDE 1 MG PO TABS: 1.0000 mg | ORAL_TABLET | Freq: Every day | ORAL | Status: AC

## 2013-10-31 NOTE — Telephone Encounter (Signed)
Pt calling regarding calling in the glipizide to his pharmacy. He contacted them and they do not have it yet.

## 2013-10-31 NOTE — Telephone Encounter (Signed)
rx was sent in on 10/22/13, patient says pharmacy never received it.  refaxed today, patient is aware

## 2014-01-31 ENCOUNTER — Other Ambulatory Visit: Payer: Self-pay | Admitting: *Deleted

## 2014-01-31 MED ORDER — PIOGLITAZONE HCL 15 MG PO TABS: 15.0000 mg | ORAL_TABLET | Freq: Every day | ORAL | Status: DC

## 2014-02-27 ENCOUNTER — Other Ambulatory Visit: Payer: Medicare HMO

## 2014-02-27 ENCOUNTER — Ambulatory Visit: Payer: Medicare HMO | Admitting: Endocrinology

## 2014-03-19 ENCOUNTER — Encounter: Payer: Self-pay | Admitting: Endocrinology

## 2014-03-19 ENCOUNTER — Other Ambulatory Visit: Payer: Self-pay | Admitting: *Deleted

## 2014-03-19 ENCOUNTER — Telehealth: Payer: Self-pay | Admitting: *Deleted

## 2014-03-19 ENCOUNTER — Other Ambulatory Visit (INDEPENDENT_AMBULATORY_CARE_PROVIDER_SITE_OTHER): Payer: Medicare HMO

## 2014-03-19 ENCOUNTER — Ambulatory Visit (INDEPENDENT_AMBULATORY_CARE_PROVIDER_SITE_OTHER): Payer: Medicare HMO | Admitting: Endocrinology

## 2014-03-19 VITALS — BP 134/64 | HR 61 | Temp 97.4°F | Resp 12 | Wt 176.0 lb

## 2014-03-19 DIAGNOSIS — E785 Hyperlipidemia, unspecified: Secondary | ICD-10-CM

## 2014-03-19 DIAGNOSIS — S335XXA Sprain of ligaments of lumbar spine, initial encounter: Secondary | ICD-10-CM

## 2014-03-19 DIAGNOSIS — R059 Cough, unspecified: Secondary | ICD-10-CM

## 2014-03-19 DIAGNOSIS — E119 Type 2 diabetes mellitus without complications: Secondary | ICD-10-CM

## 2014-03-19 DIAGNOSIS — Z856 Personal history of leukemia: Secondary | ICD-10-CM

## 2014-03-19 DIAGNOSIS — R05 Cough: Secondary | ICD-10-CM

## 2014-03-19 DIAGNOSIS — I10 Essential (primary) hypertension: Secondary | ICD-10-CM

## 2014-03-19 DIAGNOSIS — S39012A Strain of muscle, fascia and tendon of lower back, initial encounter: Secondary | ICD-10-CM

## 2014-03-19 LAB — LIPID PANEL
CHOL/HDL RATIO: 2
Cholesterol: 119 mg/dL (ref 0–200)
HDL: 47.9 mg/dL (ref >39.00)
LDL Cholesterol: 52 mg/dL (ref 0–99)
NONHDL: 71.1
Triglycerides: 97 mg/dL (ref 0.0–149.0)
VLDL: 19.4 mg/dL (ref 0.0–40.0)

## 2014-03-19 LAB — CBC WITH DIFFERENTIAL/PLATELET
Basophils Absolute: 0.1 10*3/uL (ref 0.0–0.1)
Basophils Relative: 0.3 % (ref 0.0–3.0)
EOS ABS: 0.1 10*3/uL (ref 0.0–0.7)
Eosinophils Relative: 0.4 % (ref 0.0–5.0)
HCT: 37.9 % — ABNORMAL LOW (ref 39.0–52.0)
Hemoglobin: 12.5 g/dL — ABNORMAL LOW (ref 13.0–17.0)
Lymphocytes Relative: 76.8 % — ABNORMAL HIGH (ref 12.0–46.0)
Lymphs Abs: 24.1 10*3/uL — ABNORMAL HIGH (ref 0.7–4.0)
MCHC: 32.9 g/dL (ref 30.0–36.0)
MCV: 92.7 fl (ref 78.0–100.0)
MONO ABS: 0.9 10*3/uL (ref 0.1–1.0)
Monocytes Relative: 3 % (ref 3.0–12.0)
Neutro Abs: 6.1 10*3/uL (ref 1.4–7.7)
Neutrophils Relative %: 19.5 % — ABNORMAL LOW (ref 43.0–77.0)
PLATELETS: 159 10*3/uL (ref 150.0–400.0)
RBC: 4.09 Mil/uL — ABNORMAL LOW (ref 4.22–5.81)
RDW: 13.8 % (ref 11.5–15.5)
WBC: 31.3 10*3/uL (ref 4.0–10.5)

## 2014-03-19 LAB — COMPREHENSIVE METABOLIC PANEL
ALBUMIN: 3.7 g/dL (ref 3.5–5.2)
ALK PHOS: 97 U/L (ref 39–117)
ALT: 20 U/L (ref 0–53)
AST: 21 U/L (ref 0–37)
BUN: 14 mg/dL (ref 6–23)
CALCIUM: 9.2 mg/dL (ref 8.4–10.5)
CO2: 31 mEq/L (ref 19–32)
Chloride: 94 mEq/L — ABNORMAL LOW (ref 96–112)
Creatinine, Ser: 0.8 mg/dL (ref 0.4–1.5)
GFR: 95.48 mL/min (ref >60.00)
GLUCOSE: 86 mg/dL (ref 70–99)
POTASSIUM: 4.3 meq/L (ref 3.5–5.1)
Sodium: 131 mEq/L — ABNORMAL LOW (ref 135–145)
Total Bilirubin: 0.5 mg/dL (ref 0.2–1.2)
Total Protein: 5.5 g/dL — ABNORMAL LOW (ref 6.0–8.3)

## 2014-03-19 LAB — MICROALBUMIN / CREATININE URINE RATIO
Creatinine,U: 24 mg/dL
Microalb Creat Ratio: 0.8 mg/g (ref 0.0–30.0)
Microalb, Ur: 0.2 mg/dL (ref 0.0–1.9)

## 2014-03-19 LAB — HEMOGLOBIN A1C: Hgb A1c MFr Bld: 6.1 % (ref 4.6–6.5)

## 2014-03-19 MED ORDER — GLUCOSE BLOOD VI STRP: ORAL_STRIP | Status: DC

## 2014-03-19 MED ORDER — ACCU-CHEK AVIVA PLUS W/DEVICE KIT: PACK | Status: DC

## 2014-03-19 MED ORDER — CYCLOBENZAPRINE HCL 5 MG PO TABS: 5.0000 mg | ORAL_TABLET | Freq: Three times a day (TID) | ORAL | Status: AC | PRN

## 2014-03-19 MED ORDER — ACCU-CHEK FASTCLIX LANCETS MISC: Status: DC

## 2014-03-19 MED ORDER — METHOCARBAMOL 500 MG PO TABS: 500.0000 mg | ORAL_TABLET | Freq: Three times a day (TID) | ORAL | Status: AC | PRN

## 2014-03-19 NOTE — Telephone Encounter (Signed)
Flexeril was called in as well, it needs a P.A as well, they told the patient it's not approved for patients his age.

## 2014-03-19 NOTE — Telephone Encounter (Signed)
Flexeril 5 mg up to 3 times a day as needed. Also can take extra strength Tylenol

## 2014-03-19 NOTE — Telephone Encounter (Signed)
Patient called today, he said the muscle relaxant you prescribed needs a P.A., is there something else you can send in.

## 2014-03-19 NOTE — Patient Instructions (Signed)
Please check blood sugars at least half the time about 2 hours after any meal and times per week on waking up. Please bring blood sugar monitor to each visit  Stop Glimeperide  Metformin 2 twice daily  May use Victoza 0.9mg  if sugars high

## 2014-03-19 NOTE — Telephone Encounter (Signed)
Critical lab, patients WBC is 31.3

## 2014-03-19 NOTE — Progress Notes (Signed)
Patient ID: Micheal Rivers, male   DOB: Mar 13, 1935, 79 y.o.   MRN: [id number]    Reason for Appointment: Medical follow-up   History of Present Illness   Problem 1:  Type 2 DIABETES MELITUS, date of diagnosis: 1992      Previous history:  Oral agents started in 1993; Glucophage added in 1997 and Actos in 2/05.  Had gradual weight gain with Actos and possibly had edema and now is taking only 15 mg.    Because of difficulty with weight loss he was also started on Victoza a few years ago. He seems to be doing fairly well with 0.6 mg and prefers not to increase the dose because of cost. His A1c is usually upper normal  Recent history:  He had episodes of hypoglycemia in June with readings as low as 42 which was at midnight and previously had readings a little later of 43 and 48 but he cannot recall details about these or frequency of hypoglycemia Only taking 0.5 mg Amaryl However he is taking 1500 mg of metformin in the evening and only 500 in the morning Taking 0.6 mg Victoza without side effects Did have high readings on 7/6 after lunch but this was before he went on prednisone for 10 days Checking blood sugar somewhat sporadically and mostly early morning Still taking Amaryl in the morning and blood sugars are minimally increased in the evenings before and after supper  Hypoglycemia: As above      Oral hypoglycemic drugs: Amaryl, Actos, metformin        Side effects from medications: None Proper timing of medications in relation to meals: Yes.          Monitors blood glucose:  twice a day   Glucometer: One Touch Blood Glucose readings  PREMEAL Breakfast Lunch Dinner Bedtime Overall  Glucose range: 89-162  ?   158   42-163    Mean/median: 114      98   POST-MEAL PC Breakfast PC Lunch PC Dinner  Glucose range:   231   131   Mean/median:         Meals: 3 meals per day. Eggs only at breakfast Diet is usually good, mostly vegetarian and small portions with only one roti. Does not  eat consistent protein at lunch or dinner        Physical activity: exercise:   usually trying to use an exercise bike or walk when he is not having other medical issues          Complications: are:   none    Wt Readings from Last 3 Encounters:  03/19/14 176 lb (79.833 kg)  10/22/13 180 lb 3.2 oz (81.738 kg)  06/20/13 180 lb 3.2 oz (81.738 kg)    Lab Results  Component Value Date   HGBA1C 6.2 10/22/2013   HGBA1C 6.6* 06/20/2013   HGBA1C  Value: 5.9 (NOTE) The ADA recommends the following therapeutic goal for glycemic control related to Hgb A1c measurement: Goal of therapy: <6.5 Hgb A1c  Reference: American Diabetes Association: Clinical Practice Recommendations 2010, Diabetes Care, 2010, 33: (Suppl  1). 06/08/2009   Lab Results  Component Value Date   MICROALBUR 0.5 06/20/2013   LDLCALC 59 06/20/2013   CREATININE 0.9 10/22/2013        Medication List       This list is accurate as of: 03/19/14  9:52 AM.  Always use your most recent med list.  albuterol (5 MG/ML) 0.5% nebulizer solution  Commonly known as:  PROVENTIL  Take 2.5 mg by nebulization every 6 (six) hours as needed for wheezing.     amLODipine 5 MG tablet  Commonly known as:  NORVASC  Take 5 mg by mouth daily.     aspirin 81 MG tablet  Take 81 mg by mouth daily.     atorvastatin 80 MG tablet  Commonly known as:  LIPITOR  Take 80 mg by mouth daily.     BIOFLEX Tabs  Take by mouth.     clopidogrel 75 MG tablet  Commonly known as:  PLAVIX  Take 75 mg by mouth daily.     co-enzyme Q-10 30 MG capsule  Take 30 mg by mouth 3 (three) times daily.     DEXILANT 60 MG capsule  Generic drug:  dexlansoprazole  Take 60 mg by mouth as needed.     doxazosin 8 MG tablet  Commonly known as:  CARDURA  Take 8 mg by mouth at bedtime.     DULERA IN  Inhale 2 puffs into the lungs daily. 2 puff     furosemide 20 MG tablet  Commonly known as:  LASIX  Take 20 mg by mouth.     glimepiride 1 MG  tablet  Commonly known as:  AMARYL  Take 1 tablet (1 mg total) by mouth daily before breakfast.     glucose blood test strip  Commonly known as:  ONETOUCH VERIO  Use as instructed to check blood sugars 3 times per day dx code 250.00     hydrALAZINE 50 MG tablet  Commonly known as:  APRESOLINE  Take 50 mg by mouth 3 (three) times daily.     iron polysaccharides 150 MG capsule  Commonly known as:  NIFEREX  Take 150 mg by mouth daily.     levofloxacin 750 MG tablet  Commonly known as:  LEVAQUIN  Take 750 mg by mouth daily.     loratadine 10 MG tablet  Commonly known as:  CLARITIN  Take 10 mg by mouth daily.     METFORMIN HCL PO  Take by mouth. 1 tab in am, 3 tab in pm     metoprolol succinate 50 MG 24 hr tablet  Commonly known as:  TOPROL-XL  Take 50 mg by mouth daily. Take with or immediately following a meal.     niacin 500 MG tablet  Commonly known as:  SLO-NIACIN  Take 500 mg by mouth at bedtime.     pioglitazone 15 MG tablet  Commonly known as:  ACTOS  Take 1 tablet (15 mg total) by mouth daily.     PRESERVISION AREDS 2 PO  Take by mouth.     VICTOZA 18 MG/3ML Soln injection  Generic drug:  Liraglutide  Inject 0.6 mg into the skin.     vitamin B-12 1000 MCG tablet  Commonly known as:  CYANOCOBALAMIN  Take 1,000 mcg by mouth daily.     Vitamin D3 5000 UNITS Tabs  Take by mouth.     zafirlukast 10 MG tablet  Commonly known as:  ACCOLATE  TAKE 1 TABLET AT BEDTIME        Allergies: No Known Allergies  Past Medical History  Diagnosis Date  . Coronary artery disease     Post PTCA and stenting of a Circimflex marginal artery in 1999  . Diabetes mellitus   . Hyperlipidemia     Hypercholesterolemia  . History of recurrent TIAs  DR. Leonie Rivers - started Aggranox  . Lymphocytic leukemia     Chronic  . Hypertension   . Ischemia     Recent abnormal stress Cardiolite study. He appears to have mild ischemia at the apex.  His followup heart catheterization  revealed no significant coronary artery.    Past Surgical History  Procedure Laterality Date  . US echocardiography  06-09-2009    EF 60-65%  . Leg surgery      As a child  . Hemorroidectomy  [phone]    Family History  Problem Relation Age of Onset  . Hypertension Mother   . Hypertension Father   . Stroke Father   . Diabetes Father     Social History:  reports that he has never smoked. He does not have any smokeless tobacco history on file. He reports that he does not drink alcohol. His drug history is not on file.  Review of Systems:  He is asking about low back pain since he fell down about 3 weeks ago after a spell of coughing. Still having some discomfort in his back and using a cane to walk; no radiation to the legs   Cough and expectoration: This started about 6 weeks ago and he has had a couple of rounds of antibiotics including Levaquin. This was given by the ER and he has seen his pulmonologist also. Currently having a few episodes of paroxysmal cough with little expectoration and no shortness of breath. Not taking any cough medication; he did get some better with prednisone given by pulmonologist which she finished yesterday  PREVENTIVE care: Overdue for Hemoccults  Hypertension: He has had hypertension since 1991 and taking multiple drugs. His blood pressure is usually  normal here in the office but relatively higher with other physicians. He tends to have mostly high readings at home and is not monitoring now. No lightheadedness on standing up  Had been told to reduce his doxazosin to half tablet in 2/15   Lipids: He has had diabetic dyslipidemia with baseline LDL particle number of 1213 Currently taking 80 mg Lipitor and niacin. Previously was on 500 mg twice a day of niacin but his PCP told him to take it only once a day  Lab Results  Component Value Date   CHOL 122 06/20/2013   HDL 52.60 06/20/2013   LDLCALC 59 06/20/2013   TRIG 52.0 06/20/2013   CHOLHDL 2  06/20/2013    History of CLL presenting as lymphadenopathy and gradually increasing WBC countL: Has not required any treatment for several years and WBC was last Over 20,000. Previously treated with Rituxan from Dr. Jana Hakim.  Chemotherapy stopped in 3/05. No unusual fatigue or night sweats  Hemoglobin is significantly improved with recent iron infusion, previously had used Aranesp.     History of leg edema: Taking Lasix for this from cardiologist    Examination:   BP 134/64  Pulse 61  Temp(Src) 97.4 F (36.3 C) (Oral)  Resp 12  Wt 176 lb (79.833 kg)  SpO2 97%  Body mass index is 29.29 kg/(m^2).   Decreased breath sounds on the left base posteriorly but no added sounds through the lung fields  His spine exam is normal without any abnormal curvature. No tenderness of the lumbar spine Has mild paraspinal muscle spasm on the right side in the lumbar area  ASSESSMENT/ PLAN:   Diabetes type 2   Blood glucose control appears  good but he is checking blood sugars somewhat sporadically He did have tendency to  late-night hypoglycemia last month without obvious cause Currently he is taking 1500 mg metformin in the evening and also the Amaryl is only 0.5 mg in the mornings Previously has had fairly good A1c results For now will change his metformin to 1 g twice a day and stop his glimepiride He will call if he has unusually high or low blood sugars Discussed that we can increase his dose up to 0.9 mg if he has high postprandial readings which he needs to start monitoring regularly. Showed him how to do this To resume walking when he can A1c done today He will try to follow a low carbohydrate diet with protein at each meal   Low back pain following a fall: Appears to be musculoskeletal some lumbar sprain, no tenderness of the vertebrae and will try to treat him with Tylenol and Flexeril for symptomatic relief  Cough/bronchitis/bronchiectasis: Managed by pulmonologist. Advised him to  start Mucinex DM for symptomatic relief and continue his inhalers Does not appear to have acute infection but results of recent x-rays not available for review Needs to continue followup with pulmonologist  Weight loss: Probably secondary to  recent pulmonary infection  Hypertension: Well controlled without  history of renal dysfunction or proteinuria   History of CLL: We will recheck CBC and forward a hematologist   [facility] 03/19/2014, 9:52 AM   Addendum: Flexeril denied by insurance. He will try topical Voltaren gel Also WBC significantly high and he will followup with hematologist   Appointment on 03/19/2014  Component Date Value Ref Range Status  . Cholesterol 03/19/2014 119  0 - 200 mg/dL Final   ATP III Classification       Desirable:  < 200 mg/dL               Borderline High:  200 - 239 mg/dL          High:  > = 240 mg/dL  . Triglycerides 03/19/2014 97.0  0.0 - 149.0 mg/dL Final   Normal:  <150 mg/dLBorderline High:  150 - 199 mg/dL  . HDL 03/19/2014 47.90  >39.00 mg/dL Final  . VLDL 03/19/2014 19.4  0.0 - 40.0 mg/dL Final  . LDL Cholesterol 03/19/2014 52  0 - 99 mg/dL Final  . Total CHOL/HDL Ratio 03/19/2014 2   Final                  Men          Women1/2 Average Risk     3.4          3.3Average Risk          5.0          4.42X Average Risk          9.6          7.13X Average Risk          15.0          11.0                      . NonHDL 03/19/2014 71.10   Final  . Hemoglobin A1C 03/19/2014 6.1  4.6 - 6.5 % Final   Glycemic Control Guidelines for People with Diabetes:Non Diabetic:  <6%Goal of Therapy: <7%Additional Action Suggested:  >8%   . Sodium 03/19/2014 131* 135 - 145 mEq/L Final  . Potassium 03/19/2014 4.3  3.5 - 5.1 mEq/L Final  . Chloride 03/19/2014 94* 96 - 112 mEq/L Final  . CO2 03/19/2014 31  19 - 32 mEq/L Final  . Glucose, Bld 03/19/2014 86  70 - 99 mg/dL Final  . BUN 03/19/2014 14  6 - 23 mg/dL Final  . Creatinine, Ser 03/19/2014 0.8  0.4 - 1.5 mg/dL  Final  . Total Bilirubin 03/19/2014 0.5  0.2 - 1.2 mg/dL Final  . Alkaline Phosphatase 03/19/2014 97  39 - 117 U/L Final  . AST 03/19/2014 21  0 - 37 U/L Final  . ALT 03/19/2014 20  0 - 53 U/L Final  . Total Protein 03/19/2014 5.5* 6.0 - 8.3 g/dL Final  . Albumin 03/19/2014 3.7  3.5 - 5.2 g/dL Final  . Calcium 03/19/2014 9.2  8.4 - 10.5 mg/dL Final  . GFR 03/19/2014 95.48  >60.00 mL/min Final  . LDL Particle Number 03/19/2014 745  <1000 nmol/L Final   Comment:                           Low                   < 1000                                                    Moderate         1000 - 1299                                                    Borderline-High  1300 - 1599                                                    High             1600 - 2000                                                    Very High             > 2000  . HDL Particle Number 03/19/2014 29.9* >=30.5 umol/L Final  . Small LDL Particle Number 03/19/2014 343  <=527 nmol/L Final  . LDL Size 03/19/2014 20.8  >20.5 nm Final   Comment:  ----------------------------------------------------------                                           ** INTERPRETATIVE INFORMATION**                                           PARTICLE CONCENTRATION AND SIZE                                              <--  Lower CVD Risk   Higher CVD Risk-->                            LDL AND HDL PARTICLES   Percentile in Reference Population                            HDL-P (total)        High     75th    50th    25th   Low                                                 >34.9    34.9    30.5    26.7   <26.7                            Small LDL-P          Low      25th    50th    75th   High                                                 <117     117     527     839    >839                            LDL Size   <-Large (Pattern A)->    <-Small (Pattern B)->                                              23.0    20.6           20.5      19.0                            ----------------------------------------------------------                          Small LDL-P and LDL Size are associated with CVD risk, but not after                          LDL-P is taken into account.                          These assays were developed and their performance characteristics                          determined by [employer]. These assays have not been cleared by the                          [place] Food and Drug Administration. The clinical utility of these  laboratory values have not been fully established.  Marland Kitchen LP-IR Score 03/19/2014 40  <=45 Final   Comment: INSULIN RESISTANCE MARKER                              <--Insulin Sensitive    Insulin Resistant-->                                     Percentile in Reference Population                          Insulin Resistance Score                          LP-IR Score   Low   25th   50th   75th   High                                        <27   27     45     63     >63                          LP-IR Score is inaccurate if patient is non-fasting.                          The LP-IR score is a laboratory developed index that has been                          associated with insulin resistance and diabetes risk and should be                          used as one component of a physician's clinical assessment. The                          LP-IR score listed above has not been cleared by the [place] Food and                          Drug Administration.  Jacquelyne Balint, Ur 03/19/2014 0.2  0.0 - 1.9 mg/dL Final  . Creatinine,U 03/19/2014 24.0   Final  . Microalb Creat Ratio 03/19/2014 0.8  0.0 - 30.0 mg/g Final  . WBC 03/19/2014 31.3 Repeated and verified X2.* 4.0 - 10.5 K/uL Final  . RBC 03/19/2014 4.09* 4.22 - 5.81 Mil/uL Final  . Hemoglobin 03/19/2014 12.5* 13.0 - 17.0 g/dL Final  . HCT 03/19/2014 37.9* 39.0 - 52.0 % Final  . MCV 03/19/2014 92.7  78.0 - 100.0 fl Final  . MCHC 03/19/2014 32.9  30.0 - 36.0  g/dL Final  . RDW 03/19/2014 13.8  11.5 - 15.5 % Final  . Platelets 03/19/2014 159.0  150.0 - 400.0 K/uL Final  . Neutrophils Relative % 03/19/2014 19.5* 43.0 - 77.0 % Final  . Lymphocytes Relative 03/19/2014 76.8 Repeated and verified X2.* 12.0 - 46.0 % Final  . Monocytes Relative 03/19/2014 3.0  3.0 - 12.0 % Final  . Eosinophils Relative 03/19/2014 0.4  0.0 -  5.0 % Final  . Basophils Relative 03/19/2014 0.3  0.0 - 3.0 % Final  . Neutro Abs 03/19/2014 6.1  1.4 - 7.7 K/uL Final  . Lymphs Abs 03/19/2014 24.1* 0.7 - 4.0 K/uL Final  . Monocytes Absolute 03/19/2014 0.9  0.1 - 1.0 K/uL Final  . Eosinophils Absolute 03/19/2014 0.1  0.0 - 0.7 K/uL Final  . Basophils Absolute 03/19/2014 0.1  0.0 - 0.1 K/uL Final

## 2014-03-19 NOTE — Telephone Encounter (Signed)
Please ask patient about his hematologist and fax to their office

## 2014-03-19 NOTE — Telephone Encounter (Signed)
Patient sees Dr. Candiss Norse at phone # [phone] and fax # [phone]

## 2014-03-20 ENCOUNTER — Other Ambulatory Visit: Payer: Self-pay | Admitting: *Deleted

## 2014-03-20 DIAGNOSIS — I959 Hypotension, unspecified: Secondary | ICD-10-CM

## 2014-03-20 LAB — LIPOPROTEIN ANALYSIS BY NMR
HDL Particle Number: 29.9 umol/L — ABNORMAL LOW (ref >=30.5)
LDL PARTICLE NUMBER: 745 nmol/L (ref <1000)
LDL Size: 20.8 nm (ref >20.5)
LP-IR SCORE: 40 (ref <=45)
Small LDL Particle Number: 343 nmol/L (ref <=527)

## 2014-03-20 MED ORDER — DICLOFENAC SODIUM 2 % TD SOLN: TRANSDERMAL | Status: AC

## 2014-03-20 MED ORDER — ACCU-CHEK AVIVA PLUS W/DEVICE KIT: PACK | Status: AC

## 2014-03-20 MED ORDER — ACCU-CHEK FASTCLIX LANCETS MISC: Status: AC

## 2014-03-20 MED ORDER — GLUCOSE BLOOD VI STRP: ORAL_STRIP | Status: AC

## 2014-03-20 NOTE — Telephone Encounter (Signed)
rx sent

## 2014-03-20 NOTE — Telephone Encounter (Signed)
Please send a prescription for diclofenac gel 2% to apply locally 3 times a day instead

## 2014-03-20 NOTE — Telephone Encounter (Signed)
Results faxed, will call patient and let him know.

## 2014-03-20 NOTE — Telephone Encounter (Signed)
Have the results been faxed? Please let patient know to see his hematologist

## 2014-06-14 ENCOUNTER — Other Ambulatory Visit: Payer: Self-pay

## 2014-08-09 ENCOUNTER — Other Ambulatory Visit: Payer: Self-pay | Admitting: Endocrinology

## 2014-08-16 ENCOUNTER — Encounter: Payer: Self-pay | Admitting: Endocrinology

## 2014-09-02 ENCOUNTER — Encounter: Payer: Self-pay | Admitting: Endocrinology

## 2014-09-03 ENCOUNTER — Other Ambulatory Visit: Payer: Self-pay | Admitting: Endocrinology

## 2017-03-28 ENCOUNTER — Ambulatory Visit
Admission: RE | Admit: 2017-03-28 | Discharge: 2017-03-28 | Disposition: A | Discharge status: Home / Self Care | Attending: Hematology & Oncology | Admitting: Hematology & Oncology

## 2017-03-28 ENCOUNTER — Ambulatory Visit
Admission: RE | Admit: 2017-03-28 | Discharge: 2017-03-28 | Disposition: A | Discharge status: Home / Self Care | Payer: MEDICARE

## 2017-03-28 ENCOUNTER — Ambulatory Visit: Admission: RE | Admit: 2017-03-28 | Discharge: 2017-03-28 | Disposition: A | Discharge status: Home / Self Care

## 2017-03-28 DIAGNOSIS — C9110 Chronic lymphocytic leukemia of B-cell type not having achieved remission: Principal | ICD-10-CM

## 2017-03-28 DIAGNOSIS — D508 Other iron deficiency anemias: Secondary | ICD-10-CM

## 2017-03-28 DIAGNOSIS — D801 Nonfamilial hypogammaglobulinemia: Principal | ICD-10-CM

## 2017-06-28 ENCOUNTER — Ambulatory Visit: Admission: RE | Admit: 2017-06-28 | Discharge: 2017-06-28 | Disposition: A | Discharge status: Home / Self Care

## 2017-06-28 ENCOUNTER — Ambulatory Visit
Admission: RE | Admit: 2017-06-28 | Discharge: 2017-06-28 | Disposition: A | Discharge status: Home / Self Care | Attending: Hematology & Oncology | Admitting: Hematology & Oncology

## 2017-06-28 DIAGNOSIS — C9110 Chronic lymphocytic leukemia of B-cell type not having achieved remission: Secondary | ICD-10-CM

## 2017-06-28 DIAGNOSIS — D508 Other iron deficiency anemias: Secondary | ICD-10-CM

## 2017-06-28 DIAGNOSIS — D801 Nonfamilial hypogammaglobulinemia: Principal | ICD-10-CM

## 2017-09-30 ENCOUNTER — Ambulatory Visit: Admit: 2017-09-30 | Discharge: 2017-09-30 | Payer: MEDICARE

## 2017-09-30 ENCOUNTER — Ambulatory Visit
Admit: 2017-09-30 | Discharge: 2017-09-30 | Payer: MEDICARE | Attending: Hematology & Oncology | Primary: Hematology & Oncology

## 2017-09-30 DIAGNOSIS — D509 Iron deficiency anemia, unspecified: Principal | ICD-10-CM

## 2017-09-30 DIAGNOSIS — D801 Nonfamilial hypogammaglobulinemia: Secondary | ICD-10-CM

## 2017-09-30 DIAGNOSIS — C9110 Chronic lymphocytic leukemia of B-cell type not having achieved remission: Principal | ICD-10-CM

## 2017-09-30 DIAGNOSIS — D508 Other iron deficiency anemias: Secondary | ICD-10-CM

## 2018-01-10 ENCOUNTER — Ambulatory Visit: Admit: 2018-01-10 | Discharge: 2018-01-10 | Payer: MEDICARE

## 2018-01-10 ENCOUNTER — Ambulatory Visit
Admit: 2018-01-10 | Discharge: 2018-01-10 | Payer: MEDICARE | Attending: Hematology & Oncology | Primary: Hematology & Oncology

## 2018-01-10 DIAGNOSIS — D801 Nonfamilial hypogammaglobulinemia: Secondary | ICD-10-CM

## 2018-01-10 DIAGNOSIS — D509 Iron deficiency anemia, unspecified: Secondary | ICD-10-CM

## 2018-01-10 DIAGNOSIS — C9110 Chronic lymphocytic leukemia of B-cell type not having achieved remission: Principal | ICD-10-CM

## 2018-04-11 ENCOUNTER — Ambulatory Visit
Admit: 2018-04-11 | Discharge: 2018-04-11 | Payer: MEDICARE | Attending: Hematology & Oncology | Primary: Hematology & Oncology

## 2018-04-11 ENCOUNTER — Ambulatory Visit: Admit: 2018-04-11 | Discharge: 2018-04-11 | Payer: MEDICARE

## 2018-04-11 DIAGNOSIS — C9110 Chronic lymphocytic leukemia of B-cell type not having achieved remission: Secondary | ICD-10-CM

## 2018-04-11 DIAGNOSIS — D801 Nonfamilial hypogammaglobulinemia: Principal | ICD-10-CM

## 2018-04-11 DIAGNOSIS — D508 Other iron deficiency anemias: Secondary | ICD-10-CM

## 2018-04-12 ENCOUNTER — Ambulatory Visit: Admit: 2018-04-12 | Discharge: 2018-04-13 | Payer: MEDICARE

## 2018-04-12 DIAGNOSIS — D801 Nonfamilial hypogammaglobulinemia: Secondary | ICD-10-CM

## 2018-04-12 DIAGNOSIS — D509 Iron deficiency anemia, unspecified: Principal | ICD-10-CM

## 2018-07-12 ENCOUNTER — Ambulatory Visit: Admit: 2018-07-12 | Discharge: 2018-07-12 | Payer: MEDICARE

## 2018-07-12 DIAGNOSIS — D508 Other iron deficiency anemias: Secondary | ICD-10-CM

## 2018-07-12 DIAGNOSIS — C9110 Chronic lymphocytic leukemia of B-cell type not having achieved remission: Principal | ICD-10-CM

## 2018-07-12 DIAGNOSIS — N289 Disorder of kidney and ureter, unspecified: Secondary | ICD-10-CM

## 2018-07-12 DIAGNOSIS — N179 Acute kidney failure, unspecified: Secondary | ICD-10-CM

## 2018-07-13 ENCOUNTER — Ambulatory Visit: Admit: 2018-07-13 | Discharge: 2018-07-13 | Payer: MEDICARE

## 2018-07-13 DIAGNOSIS — D509 Iron deficiency anemia, unspecified: Secondary | ICD-10-CM

## 2018-07-13 DIAGNOSIS — D801 Nonfamilial hypogammaglobulinemia: Principal | ICD-10-CM

## 2018-07-20 ENCOUNTER — Ambulatory Visit: Admit: 2018-07-20 | Discharge: 2018-07-21 | Payer: MEDICARE

## 2018-07-21 ENCOUNTER — Ambulatory Visit: Admit: 2018-07-21 | Discharge: 2018-07-22 | Payer: MEDICARE

## 2018-07-21 DIAGNOSIS — C9110 Chronic lymphocytic leukemia of B-cell type not having achieved remission: Principal | ICD-10-CM

## 2018-10-12 ENCOUNTER — Ambulatory Visit: Admit: 2018-10-12 | Discharge: 2018-10-12 | Payer: MEDICARE

## 2018-10-12 DIAGNOSIS — D801 Nonfamilial hypogammaglobulinemia: Secondary | ICD-10-CM

## 2018-10-12 DIAGNOSIS — C9110 Chronic lymphocytic leukemia of B-cell type not having achieved remission: Principal | ICD-10-CM

## 2018-10-13 ENCOUNTER — Ambulatory Visit: Admit: 2018-10-13 | Discharge: 2018-10-14 | Payer: MEDICARE

## 2018-10-13 DIAGNOSIS — D801 Nonfamilial hypogammaglobulinemia: Principal | ICD-10-CM

## 2018-10-13 DIAGNOSIS — D509 Iron deficiency anemia, unspecified: Secondary | ICD-10-CM

## 2019-01-11 ENCOUNTER — Other Ambulatory Visit: Admit: 2019-01-11 | Discharge: 2019-01-11 | Payer: MEDICARE

## 2019-01-11 ENCOUNTER — Telehealth: Admit: 2019-01-11 | Discharge: 2019-01-11 | Payer: MEDICARE

## 2019-01-11 DIAGNOSIS — C9110 Chronic lymphocytic leukemia of B-cell type not having achieved remission: Principal | ICD-10-CM

## 2019-01-15 ENCOUNTER — Ambulatory Visit: Admit: 2019-01-15 | Discharge: 2019-01-16 | Payer: MEDICARE

## 2019-01-15 DIAGNOSIS — D801 Nonfamilial hypogammaglobulinemia: Principal | ICD-10-CM

## 2019-01-15 DIAGNOSIS — D509 Iron deficiency anemia, unspecified: Secondary | ICD-10-CM

## 2019-04-13 ENCOUNTER — Ambulatory Visit: Admit: 2019-04-13 | Discharge: 2019-04-14 | Payer: MEDICARE | Attending: Adult Health | Primary: Adult Health

## 2019-04-13 ENCOUNTER — Ambulatory Visit: Admit: 2019-04-13 | Discharge: 2019-04-14 | Payer: MEDICARE

## 2019-04-13 DIAGNOSIS — D801 Nonfamilial hypogammaglobulinemia: Principal | ICD-10-CM

## 2019-04-13 DIAGNOSIS — D509 Iron deficiency anemia, unspecified: Secondary | ICD-10-CM

## 2019-04-13 DIAGNOSIS — C9110 Chronic lymphocytic leukemia of B-cell type not having achieved remission: Principal | ICD-10-CM

## 2019-04-13 DIAGNOSIS — D508 Other iron deficiency anemias: Secondary | ICD-10-CM

## 2019-06-05 DIAGNOSIS — C9110 Chronic lymphocytic leukemia of B-cell type not having achieved remission: Secondary | ICD-10-CM

## 2019-06-05 DIAGNOSIS — D801 Nonfamilial hypogammaglobulinemia: Secondary | ICD-10-CM

## 2019-06-05 MED ORDER — AMOXICILLIN 500 MG CAPSULE
ORAL_CAPSULE | Freq: Once | ORAL | 0 refills | 1.00000 days | Status: CP
Start: 2019-06-05 — End: 2019-06-05

## 2019-07-16 ENCOUNTER — Ambulatory Visit: Admit: 2019-07-16 | Discharge: 2019-07-16 | Payer: MEDICARE

## 2019-07-16 DIAGNOSIS — D509 Iron deficiency anemia, unspecified: Principal | ICD-10-CM

## 2019-07-16 DIAGNOSIS — D801 Nonfamilial hypogammaglobulinemia: Principal | ICD-10-CM

## 2019-07-16 DIAGNOSIS — C9110 Chronic lymphocytic leukemia of B-cell type not having achieved remission: Principal | ICD-10-CM

## 2019-07-19 DIAGNOSIS — D649 Anemia, unspecified: Principal | ICD-10-CM

## 2019-07-20 ENCOUNTER — Ambulatory Visit: Admit: 2019-07-20 | Discharge: 2019-07-21 | Payer: MEDICARE

## 2019-07-20 DIAGNOSIS — D649 Anemia, unspecified: Principal | ICD-10-CM

## 2019-07-20 DIAGNOSIS — C9110 Chronic lymphocytic leukemia of B-cell type not having achieved remission: Principal | ICD-10-CM

## 2019-07-20 DIAGNOSIS — D801 Nonfamilial hypogammaglobulinemia: Principal | ICD-10-CM

## 2019-07-23 DIAGNOSIS — D508 Other iron deficiency anemias: Principal | ICD-10-CM

## 2019-07-24 DIAGNOSIS — C9110 Chronic lymphocytic leukemia of B-cell type not having achieved remission: Principal | ICD-10-CM

## 2019-07-30 ENCOUNTER — Ambulatory Visit: Admit: 2019-07-30 | Discharge: 2019-07-31 | Payer: MEDICARE

## 2019-08-03 ENCOUNTER — Ambulatory Visit: Admit: 2019-08-03 | Discharge: 2019-08-04 | Payer: MEDICARE

## 2019-09-19 ENCOUNTER — Ambulatory Visit: Admit: 2019-09-19 | Discharge: 2019-09-20 | Payer: MEDICARE

## 2019-10-10 ENCOUNTER — Ambulatory Visit: Admit: 2019-10-10 | Discharge: 2019-10-11 | Payer: MEDICARE

## 2019-10-16 DIAGNOSIS — C9110 Chronic lymphocytic leukemia of B-cell type not having achieved remission: Principal | ICD-10-CM

## 2019-10-17 ENCOUNTER — Ambulatory Visit: Admit: 2019-10-17 | Discharge: 2019-10-17 | Payer: MEDICARE

## 2019-10-17 DIAGNOSIS — D801 Nonfamilial hypogammaglobulinemia: Principal | ICD-10-CM

## 2019-10-17 DIAGNOSIS — D509 Iron deficiency anemia, unspecified: Principal | ICD-10-CM

## 2019-10-17 DIAGNOSIS — D508 Other iron deficiency anemias: Principal | ICD-10-CM

## 2019-10-17 DIAGNOSIS — C9110 Chronic lymphocytic leukemia of B-cell type not having achieved remission: Principal | ICD-10-CM

## 2019-10-31 ENCOUNTER — Ambulatory Visit: Admit: 2019-10-31 | Discharge: 2019-10-31 | Payer: MEDICARE

## 2019-10-31 DIAGNOSIS — D508 Other iron deficiency anemias: Principal | ICD-10-CM

## 2019-10-31 DIAGNOSIS — D509 Iron deficiency anemia, unspecified: Principal | ICD-10-CM

## 2019-11-07 ENCOUNTER — Ambulatory Visit: Admit: 2019-11-07 | Discharge: 2019-11-07 | Payer: MEDICARE

## 2019-11-07 DIAGNOSIS — D508 Other iron deficiency anemias: Principal | ICD-10-CM

## 2019-11-07 DIAGNOSIS — D509 Iron deficiency anemia, unspecified: Principal | ICD-10-CM

## 2019-11-28 ENCOUNTER — Ambulatory Visit: Admit: 2019-11-28 | Discharge: 2019-11-28 | Payer: MEDICARE

## 2019-11-28 DIAGNOSIS — D801 Nonfamilial hypogammaglobulinemia: Principal | ICD-10-CM

## 2019-11-28 DIAGNOSIS — D508 Other iron deficiency anemias: Principal | ICD-10-CM

## 2020-01-14 ENCOUNTER — Ambulatory Visit: Admit: 2020-01-14 | Discharge: 2020-01-14 | Payer: MEDICARE

## 2020-01-14 DIAGNOSIS — D801 Nonfamilial hypogammaglobulinemia: Principal | ICD-10-CM

## 2020-01-14 DIAGNOSIS — C9110 Chronic lymphocytic leukemia of B-cell type not having achieved remission: Principal | ICD-10-CM

## 2020-02-25 ENCOUNTER — Ambulatory Visit: Admit: 2020-02-25 | Discharge: 2020-02-26 | Payer: MEDICARE

## 2020-02-25 DIAGNOSIS — D508 Other iron deficiency anemias: Principal | ICD-10-CM

## 2020-02-25 DIAGNOSIS — C9110 Chronic lymphocytic leukemia of B-cell type not having achieved remission: Principal | ICD-10-CM

## 2020-04-15 DIAGNOSIS — C9110 Chronic lymphocytic leukemia of B-cell type not having achieved remission: Principal | ICD-10-CM

## 2020-04-16 ENCOUNTER — Ambulatory Visit: Admit: 2020-04-16 | Discharge: 2020-04-17 | Payer: MEDICARE

## 2020-04-16 DIAGNOSIS — C9110 Chronic lymphocytic leukemia of B-cell type not having achieved remission: Principal | ICD-10-CM

## 2020-04-16 DIAGNOSIS — D801 Nonfamilial hypogammaglobulinemia: Principal | ICD-10-CM

## 2020-04-16 DIAGNOSIS — D508 Other iron deficiency anemias: Principal | ICD-10-CM

## 2020-04-18 ENCOUNTER — Ambulatory Visit: Admit: 2020-04-18 | Discharge: 2020-04-19 | Payer: MEDICARE

## 2020-04-18 DIAGNOSIS — D801 Nonfamilial hypogammaglobulinemia: Principal | ICD-10-CM

## 2020-04-18 DIAGNOSIS — C9110 Chronic lymphocytic leukemia of B-cell type not having achieved remission: Principal | ICD-10-CM

## 2020-04-18 DIAGNOSIS — D509 Iron deficiency anemia, unspecified: Principal | ICD-10-CM

## 2020-05-22 ENCOUNTER — Ambulatory Visit: Admit: 2020-05-22 | Discharge: 2020-05-22 | Payer: MEDICARE

## 2020-05-22 DIAGNOSIS — C9110 Chronic lymphocytic leukemia of B-cell type not having achieved remission: Principal | ICD-10-CM

## 2020-06-23 ENCOUNTER — Ambulatory Visit: Admit: 2020-06-23 | Discharge: 2020-06-24 | Payer: MEDICARE

## 2020-06-23 DIAGNOSIS — C9110 Chronic lymphocytic leukemia of B-cell type not having achieved remission: Principal | ICD-10-CM

## 2020-07-31 ENCOUNTER — Ambulatory Visit: Admit: 2020-07-31 | Discharge: 2020-07-31 | Payer: MEDICARE

## 2020-07-31 DIAGNOSIS — C9110 Chronic lymphocytic leukemia of B-cell type not having achieved remission: Principal | ICD-10-CM

## 2020-08-06 ENCOUNTER — Ambulatory Visit: Admit: 2020-08-06 | Discharge: 2020-08-07 | Payer: MEDICARE

## 2020-08-06 DIAGNOSIS — C9110 Chronic lymphocytic leukemia of B-cell type not having achieved remission: Principal | ICD-10-CM

## 2020-08-06 DIAGNOSIS — D801 Nonfamilial hypogammaglobulinemia: Principal | ICD-10-CM

## 2020-11-05 ENCOUNTER — Ambulatory Visit: Admit: 2020-11-05 | Discharge: 2020-11-06 | Payer: MEDICARE

## 2020-11-05 ENCOUNTER — Ambulatory Visit: Admit: 2020-11-05 | Discharge: 2020-11-05 | Payer: MEDICARE

## 2020-11-05 ENCOUNTER — Encounter: Admit: 2020-11-05 | Discharge: 2020-11-05 | Payer: MEDICARE

## 2020-11-05 DIAGNOSIS — D649 Anemia, unspecified: Principal | ICD-10-CM

## 2020-11-05 DIAGNOSIS — D801 Nonfamilial hypogammaglobulinemia: Principal | ICD-10-CM

## 2020-11-05 DIAGNOSIS — C9110 Chronic lymphocytic leukemia of B-cell type not having achieved remission: Principal | ICD-10-CM

## 2020-11-05 DIAGNOSIS — D509 Iron deficiency anemia, unspecified: Principal | ICD-10-CM

## 2020-11-07 ENCOUNTER — Institutional Professional Consult (permissible substitution): Admit: 2020-11-07 | Discharge: 2020-11-08 | Payer: MEDICARE

## 2020-11-07 DIAGNOSIS — D509 Iron deficiency anemia, unspecified: Principal | ICD-10-CM

## 2020-11-07 DIAGNOSIS — D649 Anemia, unspecified: Principal | ICD-10-CM

## 2020-11-07 DIAGNOSIS — D801 Nonfamilial hypogammaglobulinemia: Principal | ICD-10-CM

## 2020-11-07 DIAGNOSIS — C9110 Chronic lymphocytic leukemia of B-cell type not having achieved remission: Principal | ICD-10-CM

## 2020-11-12 ENCOUNTER — Ambulatory Visit: Admit: 2020-11-12 | Discharge: 2020-11-13

## 2020-11-14 ENCOUNTER — Ambulatory Visit: Admit: 2020-11-14 | Discharge: 2020-11-15 | Payer: MEDICARE

## 2020-11-27 DIAGNOSIS — D801 Nonfamilial hypogammaglobulinemia: Principal | ICD-10-CM

## 2020-11-27 DIAGNOSIS — C9110 Chronic lymphocytic leukemia of B-cell type not having achieved remission: Principal | ICD-10-CM

## 2020-11-27 DIAGNOSIS — D649 Anemia, unspecified: Principal | ICD-10-CM

## 2020-11-28 ENCOUNTER — Ambulatory Visit: Admit: 2020-11-28 | Discharge: 2020-11-28 | Payer: MEDICARE

## 2020-11-28 DIAGNOSIS — D801 Nonfamilial hypogammaglobulinemia: Principal | ICD-10-CM

## 2020-11-28 DIAGNOSIS — C9110 Chronic lymphocytic leukemia of B-cell type not having achieved remission: Principal | ICD-10-CM

## 2020-11-28 DIAGNOSIS — D649 Anemia, unspecified: Principal | ICD-10-CM

## 2020-11-28 DIAGNOSIS — D509 Iron deficiency anemia, unspecified: Principal | ICD-10-CM

## 2020-12-01 DIAGNOSIS — Z298 Encounter for other specified prophylactic measures: Principal | ICD-10-CM

## 2020-12-01 DIAGNOSIS — C9110 Chronic lymphocytic leukemia of B-cell type not having achieved remission: Principal | ICD-10-CM

## 2020-12-01 MED ORDER — ACALABRUTINIB 100 MG CAPSULE
ORAL_CAPSULE | Freq: Two times a day (BID) | ORAL | 3 refills | 30 days | Status: CP
Start: 2020-12-01 — End: ?
  Filled 2020-12-15: qty 60, 30d supply, fill #0

## 2020-12-02 DIAGNOSIS — C9110 Chronic lymphocytic leukemia of B-cell type not having achieved remission: Principal | ICD-10-CM

## 2020-12-02 MED ORDER — METFORMIN ER 500 MG TABLET,EXTENDED RELEASE 24 HR
0 days
Start: 2020-12-02 — End: ?

## 2020-12-04 ENCOUNTER — Ambulatory Visit: Admit: 2020-12-04 | Discharge: 2020-12-04 | Payer: MEDICARE

## 2020-12-04 DIAGNOSIS — D801 Nonfamilial hypogammaglobulinemia: Principal | ICD-10-CM

## 2020-12-04 DIAGNOSIS — C9110 Chronic lymphocytic leukemia of B-cell type not having achieved remission: Principal | ICD-10-CM

## 2020-12-04 DIAGNOSIS — Z298 Encounter for other specified prophylactic measures: Principal | ICD-10-CM

## 2020-12-05 NOTE — Unmapped (Signed)
[facility] Specialty Medication Onboarding    Specialty Medication: Calquence 100mg  tablets  Prior Authorization: Approved   Financial Assistance: No - patient doesn't qualify for additional assistance   Final Copay/Day Supply: $100 / 30 days     Insurance Restrictions: None     Notes to Pharmacist:     The triage team has completed the benefits investigation and has determined that the patient is able to fill this medication at [facility]. Please contact the patient to complete the onboarding or follow up with the prescribing physician as needed.

## 2020-12-08 NOTE — Unmapped (Unsigned)
[facility]   Patient Onboarding/Medication Counseling    Christian Abbott is a 86 y.o. male with CLL who I am counseling today on initiation of therapy.  I am speaking to the patient's family member, daughter Lissa Hoard.    Was a Nurse, learning disability used for this call? No    Verified patient's date of birth / HIPAA.    Specialty medication(s) to be sent: Hematology/Oncology: Calquence      Non-specialty medications/supplies to be sent: n/a      Medications not needed at this time: n/a         Calquence (acalabrutinib)    Medication & Administration     Dosage: Take one capsule (100mg ) 2 times a day    Administration:   ??? Take with or without food, approximately 12 hours apart.   ??? Swallow tablet whole with a full glass of water; do not open, break, or chew capsules.   ???   Adherence/Missed dose instructions: Take a missed dose as soon as you think about it. If it has been more than 3 hours since the missed dose, skip the missed dose and go back to your normal time.  Do not take 2 doses at the same time or extra doses.    Goals of Therapy     ??? To bring about and prolong progression free remission.  ??? To improve the patient's quality of life.    Side Effects & Monitoring Parameters     ??? Muscle pain, back pain, bone pain, joint pain, neck pain  ??? Headache  ??? Fatigue, loss of strength and energy  ??? Common cold symptoms  ??? Diarrhea  ??? Nausea, vomiting  ??? Abdominal pain  ??? Constipation    The following side effects should be reported to the provider:  ??? Signs of infection (fever >100.4, chills, mouth sores, sputum production)  ??? Signs of bleeding (vomiting or coughing up blood, blood that looks like coffee grounds, blood in the urine or black, red tarry stools, bruising that gets bigger without reason, any persistent or severe bleeding)  ??? Severe headache, dizziness or passing out  ??? Severe chest pain, fast or abnormal heartbeat, shortness of breath  ??? Weakness on 1 side of the body, trouble speaking or thinking, change in balance, drooping on one side of the face, or blurred eyesight.  ??? Signs of anaphylaxis (wheezing, chest tightness, swelling of face, lips, tongue or throat)    Monitoring Parameters:  ??? CBC (was monitored monthly in studies).   ??? Evaluate pregnancy status prior to use in females of reproductive potential.   ??? Atrial fibrillation and atrial flutter  ??? Signs/symptoms of bleeding (in patients receiving antiplatelet or anticoagulant therapies), infection, and secondary malignancies  ??? Adherence    Contraindications, Warnings, & Precautions     ??? Bone marrow suppression: Grade 3 or 4 cytopenias including neutropenia, anemia, and thrombocytopenia have occurred in patients with hematologic malignancies treated with acalabrutinib (as a single agent)  ??? Cardiovascular adverse effects: Atrial fibrillation and atrial flutter (any grade) occurred in a small percentage of patients with hematologic malignancies treated with acalabrutinib (as a single agent); grade 3 events were reported.  ??? Hemorrhage: Serious hemorrhagic events (some fatal) have been reported in patients with hematologic malignancies who received acalabrutinib.   ??? Infection: Serious bacterial, viral, or fungal infections (including fatal events and opportunistic infections) have occurred in patients with hematologic malignancies treated with acalabrutinib (as a single agent).  ??? Secondary malignancies: Second primary malignancies,  including non-skin carcinomas, have occurred in about one-tenth of patients with hematologic malignancies treated with acalabrutinib (as a single agent); the most frequent second primary malignancy was skin cancer.     Drug/Food Interactions     ??? Medication list reviewed in Epic. The patient was instructed to inform the care team before taking any new medications or supplements. patient instructed to stop Pantoprozole and Omeprazole.   ??? Avoid concomitant use with proton pump inhibitors  ??? Administer 2 hours prior to H2-receptor antagonists and separate from antacids by at least 2 hours.  ??? Administration with a high-fat, high-calorie meal results in Cmax decreased by 73% and Tmax delayed 1 to 2 hours.  ??? Avoid live vaccines  ??? No grapefruit / grapefruit juice or [place] oranges    Storage, Handling Precautions, & Disposal     ??? Store at room temperature in the original container (do not use a pillbox or store with other medications).   ??? Caregivers helping administer medication should wear gloves and wash hands immediately after.    ??? Keep the lid tightly closed. Keep out of the reach of children and pets.  ??? Do not flush down a toilet or pour down a drain unless instructed to do so.  Check with your local police department or fire station about drug take-back programs in your area.       Current Medications (including OTC/herbals), Comorbidities and Allergies     Current Outpatient Medications   Medication Sig Dispense Refill   ??? acalabrutinib (CALQUENCE) 100 mg capsule Take 1 capsule (100 mg total) by mouth Two (2) times a day. Do not break, open, or chew capsules. 60 capsule 3   ??? ACCU-CHEK AVIVA PLUS TEST STRP Strp      ??? ACCU-CHEK GUIDE ME GLUCOSE MTR Misc      ??? ACCU-CHEK SOFTCLIX LANCETS lancets      ??? albuterol 2.5 mg /3 mL (0.083 %) Nebu 3 mL, albuterol 5 mg/mL Nebu 0.5 mL Inhale 2.5 mg.     ??? albuterol 2.5 mg/0.5 mL nebulizer solution Inhale 2.5 mg.     ??? amLODIPine (NORVASC) 10 MG tablet Take 10 mg by mouth daily.      ??? amLODIPine-atorvastatin (CADUET) 10-10 mg per tablet      ??? atorvastatin (LIPITOR) 80 MG tablet Take 80 mg by mouth.     ??? atorvastatin (LIPITOR) 80 MG tablet      ??? BD ULTRA-FINE SHORT PEN NEEDLE 31 gauge x 5/16 (8 mm) Ndle AS DIRECTED ONCE DAILY 100 DAYS     ??? chlorhexidine (PERIDEX) 0.12 % solution RINSE 15 ML S ONCE DAILY AFTER BREAKFAST FOLLOWING BRUSHING AND FLOSSING     ??? cholecalciferol, vitamin D3, 5,000 unit Tab Take 1 tablet by mouth.     ??? clopidogrel (PLAVIX) 75 mg tablet Take 75 mg by mouth.     ??? co-enzyme Q-10 30 mg capsule Take 30 mg by mouth Three (3) times a day.     ??? coenzyme Q10 (CO Q-10) 300 mg cap      ??? cyanocobalamin, vitamin B-12, 1,000 mcg TbER Take 1 tablet by mouth.     ??? dexAMETHasone (DECADRON) 6 MG tablet TAKE 1 TABLET (6 MG TOTAL) BY MOUTH DAILY FOR 7 DAYS.     ??? doxazosin (CARDURA) 4 MG tablet Take 4 mg by mouth nightly.  1   ??? doxycycline (VIBRAMYCIN) 100 MG capsule      ??? ergocalciferol-1,250 mcg, 50,000 unit, (DRISDOL) 1,250 mcg (50,000  unit) capsule TAKE 1 CAPSULE BY MOUTH ONE TIME PER WEEK     ??? ergocalciferol-1,250 mcg, 50,000 unit, (DRISDOL) 1,250 mcg (50,000 unit) capsule Take 1 capsule by mouth once a week.     ??? ferrous sulfate 15 mg iron/1.5 mL Susp Take 1 tablet by mouth.     ??? furosemide (LASIX) 20 MG tablet Take 40 mg by mouth.     ??? furosemide (LASIX) 40 MG tablet      ??? hydrALAZINE (APRESOLINE) 50 MG tablet Take 50 mg by mouth Three (3) times a day.      ??? hydrALAZINE (APRESOLINE) 50 MG tablet      ??? ipratropium-albuterol (DUO-NEB) 0.5-2.5 mg/3 mL nebulizer GIVE 3 ML TWICE DAILY INHALATION 90 DAYS  3   ??? JENTADUETO 2.5-500 mg Tab      ??? levoFLOXacin (LEVAQUIN) 500 MG tablet Take 500 mg by mouth daily.     ??? liraglutide (VICTOZA 2-PAK) 0.6 mg/0.1 mL (18 mg/3 mL) injection      ??? losartan (COZAAR) 50 MG tablet Take 50 mg by mouth daily.  0   ??? losartan (COZAAR) 50 MG tablet      ??? metoprolol su-hydrochlorothiaz 50-12.5 mg Tb24      ??? metoprolol succinate (TOPROL-XL) 50 MG 24 hr tablet Take 50 mg by mouth.     ??? mometasone-formoterol (DULERA) 200-5 mcg/actuation HFAA Inhale 2 puffs two (2) times a day.     ??? omeprazole (PRILOSEC) 40 MG capsule Take 40 mg by mouth daily.     ??? pantoprazole (PROTONIX) 40 MG tablet Take 40 mg by mouth daily.     ??? sitaGLIPtin-metFORMIN (JANUMET) 50-1,000 mg per tablet Take 2 tablets by mouth daily at 0600.      ??? spironolactone (ALDACTONE) 25 MG tablet Take 1 tablet by mouth every other day.     ??? spironolactone (ALDACTONE) 25 MG tablet      ??? TRUE METRIX LEVEL 2 Soln      ??? VICTOZA 3-PAK 0.6 mg/0.1 mL (18 mg/3 mL) injection 0.2 ML ONCE A DAY SUBCUTANEOUS 90 DAYS  2   ??? vit C-bioflav-hesp-rutin-hb196 (BIOFLEX) 500-50-25-40 mg Tab Take by mouth.     ??? zafirlukast (ACCOLATE) 20 MG tablet Take 20 mg by mouth.       No current facility-administered medications for this visit.     Facility-Administered Medications Ordered in Other Visits   Medication Dose Route Frequency Provider Last Rate Last Admin   ??? ferric carboxymaltose 750 mg in sodium chloride (NS) 0.9 % 250 mL IVPB  750 mg Intravenous Once Domingo Dimes, MD           No Known Allergies    Patient Active Problem List   Diagnosis   ??? CLL (chronic lymphocytic leukemia) (CMS-HCC)   ??? Iron deficiency anemia   ??? Hypogammaglobulinemia (CMS-HCC)   ??? Anemia   ??? Need for prophylactic immunotherapy       Reviewed and up to date in Epic.    Appropriateness of Therapy     Acute infections noted within Epic:  No active infections  Patient reported infection: None    Is medication and dose appropriate based on diagnosis and infection status? Yes    Prescription has been clinically reviewed: Yes      Baseline Quality of Life Assessment      How many days over the past month did your condition  keep you from your normal activities? For example, brushing your teeth or getting up in  the morning. 0    Financial Information     Medication Assistance provided: Prior Authorization    Anticipated copay of $100 reviewed with patient. Verified delivery address.    Delivery Information     Scheduled delivery date: 12/15/20    Expected start date: 12/19/20    Medication will be delivered via Same Day Courier to the prescription address in [facility].  This shipment will not require a signature.      Explained the services we provide at [facility] and that each month we would call to set up refills.  Stressed importance of returning phone calls so that we could ensure they receive their medications in time each month.  Informed patient that we should be setting up refills 7-10 days prior to when they will run out of medication.  A pharmacist will reach out to perform a clinical assessment periodically.  Informed patient that a welcome packet, containing information about our pharmacy and other support services, a Notice of Privacy Practices, and a drug information handout will be sent.      Patient verbalized understanding of the above information as well as how to contact the pharmacy at [phone] option 4 with any questions/concerns.  The pharmacy is open Monday through Friday 8:30am-4:30pm.  A pharmacist is available 24/7 via pager to answer any clinical questions they may have.    Patient Specific Needs     - Does the patient have any physical, cognitive, or cultural barriers? No    - Patient prefers to have medications discussed with  Family Member     - Is the patient or caregiver able to read and understand education materials at a high school level or above? Yes    - Patient's primary language is  English     - Is the patient high risk? Yes, patient is taking oral chemotherapy. Appropriateness of therapy as been assessed    - Does the patient require a Care Management Plan? No     - Does the patient require physician intervention or other additional services (i.e. nutrition, smoking cessation, social work)? No      Florene Route [name] Specialty Pharmacist Specialty Pharmacist

## 2020-12-11 ENCOUNTER — Ambulatory Visit: Admit: 2020-12-11 | Discharge: 2020-12-12 | Payer: MEDICARE

## 2020-12-11 DIAGNOSIS — C9110 Chronic lymphocytic leukemia of B-cell type not having achieved remission: Principal | ICD-10-CM

## 2020-12-11 NOTE — Unmapped (Signed)
Oral Chemotherapy Education:    Christian Abbott is a 86 y.o. male who presents today for virtual Chemotherapy Patient Education session.  Patient has a co-learner, his daughter Lamar Laundry, present for the session. Patient states English is the preferred language and verbalizes no need for interpreter.  Verbalizes no cultural or religious beliefs that would influence treatment plan or education.  States that the preferred learning style is Reading and Listening.  No barriers to learning were identified. Current emotional state is calm. Verbalizes interest in learning about the proposed treatment plan.     Oral chemotherapy orders reviewed. Reviewed monographs on Acalabrutinib (Calquence) from [email or web address].  Discussed with patient the potential side effects including diarrhea, fatigue, leukopenia/neutropenia, thrombocytopenia, anemia and Muscle and Joint Pains/Aches.  Patient receiving Calquence (Acalabrutinib) from [facility]. Reviewed the side effect management packet as well.  Reinforced who and how to contact office both during business hours as well after hours and weekends for emergencies. [facility] provided to patient which includes contact information and additional resources such as Biochemist, clinical, social workers, and financial counselors, as well as locations of [facility] express care centers.  Patient made aware that clinical trials are available and patient's are screened, as appropriate, by the Research Team. Reviewed information regarding how to sign up for the [facility] My Chart patient portal.      Reviewed with patient:  - Dose, schedule and any dietary restriction  - Instructed to swallow the medication whole; not to crush, chew, or break tablets or capsules; and not to take any damaged tablets or capsules.  - If a dose is skipped or missed, resume the next dose at the regular scheduled time. Advised never to double up on the dose.  - Oral chemotherapy should be stored in a dry area at room temperature away from pets and children.    - Discussed safe handling, proper storage and disposal of unused medication.    - If caregiver handles the medication, gloves should be worn when handling.  - Advised not to dispose of unused medication in the trash, toilet or water supply.               Pre-treatment labs to be drawn 11/28/20.   Gait not observed.  Height/weight not obtained  Patient safe sex practices reviewed      Pain Score = 0.  Denies pain      Baseline Nutriscore = 1.  Appetite is reasonably good and has been increasing since he had COVID-19 in February    Distress Screening Score = number not obtained.  Patient has daughter and son who help to take care of the patient.  Patient's wife passed away from cancer.  Patient does need help cooking and the kids help him with that.     Smoking Status = Never Smoker.   Line Status= Oral.      Calendar with dose and scheduling provided.      Treatment Start Date: 12/19/20  First Follow up with provider care team: 01/02/21      Used teach back method to ensure learning took place.  Patient was able to verbalize potential toxicities, reasons to call the office immediately and how to contact our office. Direct contact information provided to the patient. Confirmed that provider explained patient's diagnosis and treatment plan and that it has been explained to patient's satisfaction. Confirmed that the provider explained alternative treatments and the risks and benefits.  Patient states that all of their  questions about the treatment plan have been answered and they have no further questions at this time.       Azyriah Nevins J Amala Petion

## 2021-01-01 DIAGNOSIS — D801 Nonfamilial hypogammaglobulinemia: Principal | ICD-10-CM

## 2021-01-01 DIAGNOSIS — D649 Anemia, unspecified: Principal | ICD-10-CM

## 2021-01-01 DIAGNOSIS — C9110 Chronic lymphocytic leukemia of B-cell type not having achieved remission: Principal | ICD-10-CM

## 2021-01-02 ENCOUNTER — Ambulatory Visit: Admit: 2021-01-02 | Discharge: 2021-01-02 | Payer: MEDICARE

## 2021-01-02 DIAGNOSIS — C9110 Chronic lymphocytic leukemia of B-cell type not having achieved remission: Principal | ICD-10-CM

## 2021-01-02 DIAGNOSIS — D649 Anemia, unspecified: Principal | ICD-10-CM

## 2021-01-02 DIAGNOSIS — D801 Nonfamilial hypogammaglobulinemia: Principal | ICD-10-CM

## 2021-01-02 LAB — COMPREHENSIVE METABOLIC PANEL
ALBUMIN: 3.1 g/dL — ABNORMAL LOW (ref 3.5–5.0)
ALKALINE PHOSPHATASE: 134 U/L — ABNORMAL HIGH (ref 46–116)
ALT (SGPT): 24 U/L (ref 12–78)
ANION GAP: 9 mmol/L (ref 3–11)
AST (SGOT): 16 U/L (ref 15–40)
BILIRUBIN TOTAL: 0.4 mg/dL (ref 0.2–1.0)
BLOOD UREA NITROGEN: 38 mg/dL — ABNORMAL HIGH (ref 8–20)
BUN / CREAT RATIO: 33
CALCIUM: 8.5 mg/dL (ref 8.5–10.1)
CHLORIDE: 105 mmol/L (ref 98–107)
CO2: 24.4 mmol/L (ref 21.0–32.0)
CREATININE: 1.14 mg/dL (ref 0.80–1.30)
EGFR CKD-EPI AA MALE: 68 mL/min/{1.73_m2}
EGFR CKD-EPI NON-AA MALE: 59 mL/min/{1.73_m2}
GLUCOSE RANDOM: 262 mg/dL — ABNORMAL HIGH (ref 70–179)
POTASSIUM: 4.5 mmol/L (ref 3.5–5.0)
PROTEIN TOTAL: 5.6 g/dL — ABNORMAL LOW (ref 6.0–8.0)
SODIUM: 138 mmol/L (ref 135–145)

## 2021-01-02 LAB — FERRITIN: FERRITIN: 241.8 ng/mL (ref 10.5–307.3)

## 2021-01-02 LAB — IRON & TIBC
IRON SATURATION: 21 % (ref 20–55)
IRON: 54 ug/dL (ref 50–150)
TOTAL IRON BINDING CAPACITY: 256 ug/dL (ref 250–450)

## 2021-01-02 LAB — CBC W/ AUTO DIFF
BASOPHILS ABSOLUTE COUNT: 0.1 10*9/L (ref 0.0–0.1)
BASOPHILS RELATIVE PERCENT: 0.1 %
EOSINOPHILS ABSOLUTE COUNT: 0.1 10*9/L (ref 0.0–0.7)
EOSINOPHILS RELATIVE PERCENT: 0.2 %
HEMATOCRIT: 28.6 % — ABNORMAL LOW (ref 38.0–50.0)
HEMOGLOBIN: 9 g/dL — ABNORMAL LOW (ref 13.5–17.5)
LYMPHOCYTES ABSOLUTE COUNT: 31.2 10*9/L — ABNORMAL HIGH (ref 0.7–4.0)
LYMPHOCYTES RELATIVE PERCENT: 85.5 %
MEAN CORPUSCULAR HEMOGLOBIN CONC: 31.6 g/dL (ref 30.0–36.0)
MEAN CORPUSCULAR HEMOGLOBIN: 27.8 pg (ref 26.0–34.0)
MEAN CORPUSCULAR VOLUME: 87.9 fL (ref 81.0–95.0)
MEAN PLATELET VOLUME: 7.9 fL (ref 7.0–10.0)
MONOCYTES ABSOLUTE COUNT: 0.5 10*9/L (ref 0.1–1.0)
MONOCYTES RELATIVE PERCENT: 1.4 %
NEUTROPHILS ABSOLUTE COUNT: 4.7 10*9/L (ref 1.7–7.7)
NEUTROPHILS RELATIVE PERCENT: 12.8 %
PLATELET COUNT: 101 10*9/L — ABNORMAL LOW (ref 150–450)
RED BLOOD CELL COUNT: 3.26 10*12/L — ABNORMAL LOW (ref 4.32–5.72)
RED CELL DISTRIBUTION WIDTH: 18.5 % — ABNORMAL HIGH (ref 12.0–15.0)
WBC ADJUSTED: 36.5 10*9/L — ABNORMAL HIGH (ref 3.5–10.5)

## 2021-01-02 LAB — RETICULOCYTES
RETICULOCYTE ABSOLUTE COUNT: 71.5 10*9/L (ref 23.0–80.0)
RETICULOCYTE COUNT PCT: 2.14 % — ABNORMAL HIGH (ref 0.50–1.70)

## 2021-01-02 LAB — SLIDE REVIEW

## 2021-01-02 LAB — IGG: GAMMAGLOBULIN; IGG: 280 mg/dL — ABNORMAL LOW (ref 646–2013)

## 2021-01-02 NOTE — Unmapped (Signed)
HEMATOLOGY ONCOLOGY CLINIC:  INTERVAL FOLLOW UP  NOTE    REFERRING PHYSICIAN:  Sidhu, Clinton Quant,*  PCP:  Mliss Fritz, MD    DATE OF ENCOUNTER:  01/02/2021    INTERVAL HISTORY:  Christian Abbott is an 86 y.o. male who comes today for continued follow-up of chronic lymphocytic leukemia.  Patient was recently admitted at [facility] for COVID-19 pneumonia from 10/06/2020???10/10/2020.  This was after he presented with fevers and shortness of breath he was treated with Decadron and remdesivir and a CT of his chest was consistent with multifocal pneumonia and showed significant mediastinal adenopathy.  Repeat CT chest without IV contrast on 10/16/2018 performed by Dr. Virgia Land revealed improving COVID-19 pneumonia and prominent mediastinal and hilar nodes which appear to be decreased from the prior exam and on 10/06/2020.  Given his advanced disease he was started on acalbrutinib 2 weeks ago.  So far tolerating it well. Feels more energetic. Less fatigue.         ONCOLOGY HISTORY  Oncology History Overview Note   86 y/o male with long-standing history of chronic lymphocytic leukemia initially diagnosed in 1998  Status post Rituxan based therapy for 8 weeks during that time  History of anemia treated with Procrit off and on  History of hypertension diabetes asthma hypercholesterolemia.  History of hospitalization in January 2014 for bilateral pneumonia [facility] for 3 weeks.     CLL (chronic lymphocytic leukemia) (CMS-HCC)   07/18/1997 Initial Diagnosis    CLL (chronic lymphocytic leukemia)           PAST MEDICAL HISTORY:     Patient Active Problem List   Diagnosis   ??? CLL (chronic lymphocytic leukemia) (CMS-HCC)   ??? Iron deficiency anemia   ??? Hypogammaglobulinemia (CMS-HCC)   ??? Anemia   ??? Need for prophylactic immunotherapy      has no past surgical history on file.    ALLERGIES:  has No Known Allergies.      MEDICATIONS: has a current medication list which includes the following prescription(s): acalabrutinib, accu-chek aviva plus test strp, accu-chek guide me glucose mtr, accu-chek softclix lancets, albuterol 2.5 mg /3 mL (0.083 %) Nebu 3 mL, albuterol 5 mg/mL Nebu 0.5 mL, albuterol, amlodipine, amlodipine-atorvastatin, atorvastatin, atorvastatin, bd ultra-fine short pen needle, chlorhexidine, cholecalciferol (vitamin d3-125 mcg (5,000 unit)), clopidogrel, co-enzyme q-10, co q-10, cyanocobalamin (vitamin b-12), dexamethasone, doxazosin, doxycycline, ergocalciferol-1,250 mcg (50,000 unit), ergocalciferol-1,250 mcg (50,000 unit), ferrous sulfate, furosemide, hydralazine, hydralazine, ipratropium-albuterol, jentadueto, levofloxacin, victoza 2-pak, liraglutide, losartan, losartan, metformin, metoprolol su-hydrochlorothiaz, metoprolol succinate, mometasone-formoterol, sitagliptin-metformin, spironolactone, spironolactone, true metrix level 2, victoza 3-pak, bioflex, zafirlukast, and furosemide, and the following Facility-Administered Medications: ferric carboxymaltose 750 mg in sodium chloride (NS) 0.9 % 250 mL IVPB.    FAMILY HISTORY / SOCIAL HISTORY:  Reviewed and updated as appropriate.    REVIEW OF SYSTEMS:  As per interval history.  All other systems reviewed and negative.    PHYSICAL EXAM    Vitals: BP 138/63  - Pulse 77  - Temp 36.9 ??C (98.4 ??F) (Temporal)  - Resp 16  - Ht 165.1 cm (5' 5)  - Wt 68.1 kg (150 lb 3.2 oz)  - SpO2 98%  - BMI 24.99 kg/m??   General:  Pleasant, no acute distress.    Pain:  Pain Evaluation:                                   Pain  Score (0 - 1):                                Pain Location:                                       Eyes:  Appears normal.   HENT:  Atraumatic.   Neck:  thyroid midline.  Lymphatics: Bilateral small cervical lymph nodes.  Supraclavicular axillary or inguinal lymphadenopathy noted   Cardiovascular:  No edema RRR  Respiratory:  Unlabored.  mildly diminished R base. No wheezing.  Mild crackles at the left base  Gastrointestinal: Does not appear distended.  Nontender. No palpable HSM.   Skin:  No rashes or subcutaneous nodules.    Musculoskeletal:  Gait is steady     Psychiatric:  Affect appropriate. Judgment and insight nl.  Neurologic:  Alert and oriented x3. Grossly non-focal.          RECENT LABS/PATHOLOGY:  Results for orders placed or performed in visit on 01/02/21   CBC w/ Differential   Result Value Ref Range    Results Verified by Slide Scan Slide Reviewed     WBC 36.5 (H) 3.5 - 10.5 10*9/L    RBC 3.26 (L) 4.32 - 5.72 10*12/L    HGB 9.0 (L) 13.5 - 17.5 g/dL    HCT 29.5 (L) 62.1 - 50.0 %    MCV 87.9 81.0 - 95.0 fL    MCH 27.8 26.0 - 34.0 pg    MCHC 31.6 30.0 - 36.0 g/dL    RDW 30.8 (H) 65.7 - 15.0 %    MPV 7.9 7.0 - 10.0 fL    Platelet 101 (L) 150 - 450 10*9/L    Neutrophils % 12.8 %    Lymphocytes % 85.5 %    Monocytes % 1.4 %    Eosinophils % 0.2 %    Basophils % 0.1 %    Absolute Neutrophils 4.7 1.7 - 7.7 10*9/L    Absolute Lymphocytes 31.2 (H) 0.7 - 4.0 10*9/L    Absolute Monocytes 0.5 0.1 - 1.0 10*9/L    Absolute Eosinophils 0.1 0.0 - 0.7 10*9/L    Absolute Basophils 0.1 0.0 - 0.1 10*9/L    Anisocytosis Slight (A) Not Present   Morphology Review   Result Value Ref Range    Rouleaux Present (A) Not Present     RADIOLOGY    11/14/2020  PET CT   IMPRESSION:  Patchy consolidative opacities within the lungs, increased from chest CT 10/16/2020, particularly in the lingula with areas FDG avid focus.   This is likely infectious/inflammatory.   Recommend follow-up chest CT in 3 months.  Multiple enlarged lymph nodes in the neck, chest, abdomen and pelvis without significant FDG avidity.   Splenomegaly, without significant FDG avidity        ASSESSMENT and PLAN:    1. Chronic lymphocytic leukemia  --Rai stage IV CLL  --CLL FISH panel shows 13 q. deletion 21.5%, IGHV mutated -good prognostic features  --11/05/2020: T p53 mutation negative.  Notch 1 -.  SF 3 B1 positive.  SF 3 B1 positivity indicates poor prognosis.  --Repeat CLL FISH and IGH V mutation status pending from today  -- 07/2019 DAT, haptoglobin, LDH normal???no hemolysis  --Indication for treatment advanced stage and history of thrombocytopenia in the past  --His recent improvement  in thrombocytopenia as well as reduction in lymphocytosis is likely result of his recent steroid exposure with dexamethasone and remdesivir when he was treated for COVID-19 pneumonia and 2 additional courses of prednisone he received thereafter with Dr.Vora.  -- He has been treated with Rituxan many years ago by Dr Thedore Mins, with good response in the 1990s. Treatment ndication at that time may have been frequent infections.  --I reviewed his recent PET scan.  The mediastinal hilar intra-abdominal lymph nodes are without significant avidity thereby unlikely to have Richter's transformation.  Some of the hilar mediastinal adenopathy may be also reactive to his recent COVID-19 pneumonia.  The patchy opacities within the lung appear to be slightly worse compared to 10/16/2020 and require 35-month follow-up CT chest which we will order due in 02/14/2021  --Today we discussed treatment of his CLL in detail.  He is a frail 86 year old with a ECOG performance status of 2/borderline 3.  His performance that is a somewhat worsened by his recent COVID-19 pneumonia and hospitalization.  -- initiated acalbrutinib 100 mg BID .   -- hb 9 plt 101 and leukocytosis noted. liekly medication related. Will CTM       PLAN:  -- continue Acalbrutinib 100 mg BID.     2.  Iron deficiency anemia-resolved.   -- He was noted to have iron deficiency in 2014 and has since received IV iron in the past and has been on oral iron 1 pill/day.   -- iron deficiency anemia/ACD in 07/2019 with hb down to 8.7 and ferritin 78, iron sat 12%  -- now s/o 1020 mg ferheme 07/2019 and early 10/2018 with hb improvement to 10.9 today.   --EGD and colonoscopy with Dr. Loreta Ave 01/2020 revealed bleeding gastric ulcer, smaller antral ulcer with old blood. .  He has been placed on a PPI by Dr. Loreta Ave.  --Repeating iron panel and ferritin, reticulocyte today.      3. Hypogammaglobulinemia   --History level continues to be low however he has not had any more infections since we started him on IVIG. Before we started him on IVIG he has had multiple hospitalizations for pneumonias.  --Therefore we will continue IVIG for now every 3 months as he is tolerated this well.  --received IVIG 11/05/20. Next dose due in 1 month at next visit       4. Health maintenance:  - he is due to for the 4th dose of COVID19 vaccine now.   -- Evusheld  Received 12/04/20   -- encouraged to schedule 4th dose of covid 9 vaccine now.       Elyse Hsu  01/02/2021, 8:38 AM      RTC in 1 month    CC:    Sidhu, Elpidio Anis, MD

## 2021-01-08 NOTE — Unmapped (Signed)
Christian Abbott [facility] Clinical Assessment & Refill Coordination Note    Christian Abbott, DOB: 11-26-1934  Phone: There are no phone numbers on file.    All above HIPAA information was verified with patient.     Was a Nurse, learning disability used for this call? No    Specialty Medication(s):   Hematology/Oncology: Calquence     Current Outpatient Medications   Medication Sig Dispense Refill   ??? acalabrutinib (CALQUENCE) 100 mg capsule Take 1 capsule (100 mg total) by mouth Two (2) times a day. Do not break, open, or chew capsules. 60 capsule 3   ??? ACCU-CHEK AVIVA PLUS TEST STRP Strp      ??? ACCU-CHEK GUIDE ME GLUCOSE MTR Misc      ??? ACCU-CHEK SOFTCLIX LANCETS lancets      ??? albuterol 2.5 mg /3 mL (0.083 %) Nebu 3 mL, albuterol 5 mg/mL Nebu 0.5 mL Inhale 2.5 mg.     ??? albuterol 2.5 mg/0.5 mL nebulizer solution Inhale 2.5 mg.     ??? amLODIPine (NORVASC) 10 MG tablet Take 10 mg by mouth daily.      ??? amLODIPine-atorvastatin (CADUET) 10-10 mg per tablet      ??? atorvastatin (LIPITOR) 80 MG tablet Take 80 mg by mouth.     ??? atorvastatin (LIPITOR) 80 MG tablet      ??? BD ULTRA-FINE SHORT PEN NEEDLE 31 gauge x 5/16 (8 mm) Ndle AS DIRECTED ONCE DAILY 100 DAYS     ??? chlorhexidine (PERIDEX) 0.12 % solution RINSE 15 ML S ONCE DAILY AFTER BREAKFAST FOLLOWING BRUSHING AND FLOSSING     ??? cholecalciferol, vitamin D3, 5,000 unit Tab Take 1 tablet by mouth.     ??? clopidogrel (PLAVIX) 75 mg tablet Take 75 mg by mouth.     ??? co-enzyme Q-10 30 mg capsule Take 30 mg by mouth Three (3) times a day.     ??? coenzyme Q10 (CO Q-10) 300 mg cap      ??? cyanocobalamin, vitamin B-12, 1,000 mcg TbER Take 1 tablet by mouth.     ??? dexAMETHasone (DECADRON) 6 MG tablet TAKE 1 TABLET (6 MG TOTAL) BY MOUTH DAILY FOR 7 DAYS.     ??? doxazosin (CARDURA) 4 MG tablet Take 4 mg by mouth nightly.  1   ??? doxycycline (VIBRAMYCIN) 100 MG capsule      ??? ergocalciferol-1,250 mcg, 50,000 unit, (DRISDOL) 1,250 mcg (50,000 unit) capsule TAKE 1 CAPSULE BY MOUTH ONE TIME PER WEEK     ??? ergocalciferol-1,250 mcg, 50,000 unit, (DRISDOL) 1,250 mcg (50,000 unit) capsule Take 1 capsule by mouth once a week.     ??? ferrous sulfate 15 mg iron/1.5 mL Susp Take 1 tablet by mouth.     ??? furosemide (LASIX) 20 MG tablet Take 40 mg by mouth.     ??? furosemide (LASIX) 40 MG tablet  (Patient not taking: Reported on 01/02/2021)     ??? hydrALAZINE (APRESOLINE) 50 MG tablet Take 50 mg by mouth Three (3) times a day.      ??? hydrALAZINE (APRESOLINE) 50 MG tablet      ??? ipratropium-albuterol (DUO-NEB) 0.5-2.5 mg/3 mL nebulizer GIVE 3 ML TWICE DAILY INHALATION 90 DAYS  3   ??? JENTADUETO 2.5-500 mg Tab      ??? levoFLOXacin (LEVAQUIN) 500 MG tablet Take 500 mg by mouth daily.     ??? liraglutide (VICTOZA 2-PAK) 0.6 mg/0.1 mL (18 mg/3 mL) injection      ??? LIRAGLUTIDE SUBQ Inject  1.8 mg under the skin.     ??? losartan (COZAAR) 50 MG tablet Take 50 mg by mouth daily.  0   ??? losartan (COZAAR) 50 MG tablet      ??? metFORMIN (GLUCOPHAGE-XR) 500 MG 24 hr tablet      ??? metoprolol su-hydrochlorothiaz 50-12.5 mg Tb24      ??? metoprolol succinate (TOPROL-XL) 50 MG 24 hr tablet Take 50 mg by mouth.     ??? mometasone-formoterol (DULERA) 200-5 mcg/actuation HFAA Inhale 2 puffs two (2) times a day.     ??? sitaGLIPtin-metFORMIN (JANUMET) 50-1,000 mg per tablet Take 2 tablets by mouth daily at 0600.      ??? spironolactone (ALDACTONE) 25 MG tablet Take 1 tablet by mouth every other day.     ??? spironolactone (ALDACTONE) 25 MG tablet      ??? TRUE METRIX LEVEL 2 Soln      ??? VICTOZA 3-PAK 0.6 mg/0.1 mL (18 mg/3 mL) injection 0.2 ML ONCE A DAY SUBCUTANEOUS 90 DAYS  2   ??? vit C-bioflav-hesp-rutin-hb196 (BIOFLEX) 500-50-25-40 mg Tab Take by mouth.     ??? zafirlukast (ACCOLATE) 20 MG tablet Take 20 mg by mouth.       No current facility-administered medications for this visit.     Facility-Administered Medications Ordered in Other Visits   Medication Dose Route Frequency Provider Last Rate Last Admin   ??? ferric carboxymaltose 750 mg in sodium chloride (NS) 0.9 % 250 mL IVPB  750 mg Intravenous Once Domingo Dimes, MD            Changes to medications: Tavian reports no changes at this time.    No Known Allergies    Changes to allergies: No    SPECIALTY MEDICATION ADHERENCE     Calquence 100 mg: 10 days of medicine on hand      Medication Adherence    Patient reported X missed doses in the last month: 0  Specialty Medication: Calquence 100mg           Specialty medication(s) dose(s) confirmed: Regimen is correct and unchanged.     Are there any concerns with adherence? No    Adherence counseling provided? Not needed    CLINICAL MANAGEMENT AND INTERVENTION      Clinical Benefit Assessment:    Do you feel the medicine is effective or helping your condition? Yes    Clinical Benefit counseling provided? Not needed    Adverse Effects Assessment:    Are you experiencing any side effects? No    Are you experiencing difficulty administering your medicine? No    Quality of Life Assessment:    How many days over the past month did your condition/medication  keep you from your normal activities? For example, brushing your teeth or getting up in the morning. 0    Have you discussed this with your provider? Not needed    Acute Infection Status:    Acute infections noted within Epic:  No active infections  Patient reported infection: None    Therapy Appropriateness:    Is therapy appropriate? Yes, therapy is appropriate and should be continued    DISEASE/MEDICATION-SPECIFIC INFORMATION      N/A    PATIENT SPECIFIC NEEDS     - Does the patient have any physical, cognitive, or cultural barriers? No    - Is the patient high risk? Yes, patient is taking oral chemotherapy. Appropriateness of therapy as been assessed    - Does the patient require a Care Management  Plan? No     - Does the patient require physician intervention or other additional services (i.e. nutrition, smoking cessation, social work)? No      SHIPPING     Specialty Medication(s) to be Shipped: Hematology/Oncology: Calquence    Other medication(s) to be shipped: No additional medications requested for fill at this time     Changes to insurance: No    Delivery Scheduled: Yes, Expected medication delivery date: 01/13/21.     Medication will be delivered via Same Day Courier to the confirmed prescription address in [facility].    The patient will receive a drug information handout for each medication shipped and additional FDA Medication Guides as required.  Verified that patient has previously received a Conservation officer, historic buildings and a Surveyor, mining.    The patient or caregiver noted above participated in the development of this care plan and knows that they can request review of or adjustments to the care plan at any time.      All of the patient's questions and concerns have been addressed.    Christian Abbott   [facility] Specialty Pharmacist

## 2021-01-13 MED FILL — CALQUENCE 100 MG CAPSULE: ORAL | 30 days supply | Qty: 60 | Fill #1

## 2021-01-20 MED ORDER — BD ALCOHOL SWABS
0 days
Start: 2021-01-20 — End: ?

## 2021-02-04 NOTE — Unmapped (Signed)
[facility] Refill Coordination Note    Specialty Medication(s) to be Shipped:   Hematology/Oncology: Calquence    Other medication(s) to be shipped: No additional medications requested for fill at this time     Christian Abbott, DOB: 1935/02/09  Phone: There are no phone numbers on file.      All above HIPAA information was verified with patient.     Was a Nurse, learning disability used for this call? No    Completed refill call assessment today to schedule patient's medication shipment from the [facility] [phone]).  All relevant notes have been reviewed.     Specialty medication(s) and dose(s) confirmed: Regimen is correct and unchanged.   Changes to medications: [name] reports no changes at this time.  Changes to insurance: No  New side effects reported not previously addressed with a pharmacist or physician: None reported  Questions for the pharmacist: No    Confirmed patient received a Conservation officer, historic buildings and a Surveyor, mining with first shipment. The patient will receive a drug information handout for each medication shipped and additional FDA Medication Guides as required.       DISEASE/MEDICATION-SPECIFIC INFORMATION        N/A    SPECIALTY MEDICATION ADHERENCE     Medication Adherence    Patient reported X missed doses in the last month: 0  Specialty Medication: Calquence 100mg   Patient is on additional specialty medications: No  Informant: child/children              Were doses missed due to medication being on hold? No    Calquence 100 mg: 10 days of medicine on hand     REFERRAL TO PHARMACIST     Referral to the pharmacist: Not needed      [facility]     Shipping address confirmed in Epic.     Delivery Scheduled: Yes, Expected medication delivery date: 02/11/21.     Medication will be delivered via Next Day Courier to the temporary address in Epic WAM.    [name]   [facility] Specialty Technician

## 2021-02-10 MED FILL — CALQUENCE 100 MG CAPSULE: ORAL | 30 days supply | Qty: 60 | Fill #2

## 2021-02-13 ENCOUNTER — Ambulatory Visit: Admit: 2021-02-13 | Discharge: 2021-02-14 | Payer: MEDICARE

## 2021-02-24 DIAGNOSIS — C9110 Chronic lymphocytic leukemia of B-cell type not having achieved remission: Principal | ICD-10-CM

## 2021-02-24 MED ORDER — ACALABRUTINIB 100 MG CAPSULE
ORAL_CAPSULE | Freq: Two times a day (BID) | ORAL | 5 refills | 30 days | Status: CP
Start: 2021-02-24 — End: ?
  Filled 2021-03-12: qty 60, 30d supply, fill #0

## 2021-02-24 NOTE — Unmapped (Signed)
Patient requires refills for Acalabrutinib 100 mg BID.  Please approve.

## 2021-02-26 ENCOUNTER — Ambulatory Visit: Admit: 2021-02-26 | Discharge: 2021-02-26 | Payer: MEDICARE

## 2021-02-26 DIAGNOSIS — C9110 Chronic lymphocytic leukemia of B-cell type not having achieved remission: Principal | ICD-10-CM

## 2021-02-26 DIAGNOSIS — U071 Pneumonia due to COVID-19 virus: Principal | ICD-10-CM

## 2021-02-26 DIAGNOSIS — D801 Nonfamilial hypogammaglobulinemia: Principal | ICD-10-CM

## 2021-02-26 DIAGNOSIS — J1282 Pneumonia due to COVID-19 virus: Principal | ICD-10-CM

## 2021-02-26 DIAGNOSIS — E871 Hypo-osmolality and hyponatremia: Principal | ICD-10-CM

## 2021-02-26 DIAGNOSIS — D509 Iron deficiency anemia, unspecified: Principal | ICD-10-CM

## 2021-02-26 LAB — COMPREHENSIVE METABOLIC PANEL
ALBUMIN: 3.2 g/dL — ABNORMAL LOW (ref 3.5–5.0)
ALKALINE PHOSPHATASE: 145 U/L — ABNORMAL HIGH (ref 46–116)
ALT (SGPT): 30 U/L (ref 12–78)
ANION GAP: 6 mmol/L (ref 3–11)
AST (SGOT): 16 U/L (ref 15–40)
BILIRUBIN TOTAL: 0.4 mg/dL (ref 0.2–1.0)
BLOOD UREA NITROGEN: 29 mg/dL — ABNORMAL HIGH (ref 8–20)
BUN / CREAT RATIO: 26
CALCIUM: 8.9 mg/dL (ref 8.5–10.1)
CHLORIDE: 93 mmol/L — ABNORMAL LOW (ref 98–107)
CO2: 26.1 mmol/L (ref 21.0–32.0)
CREATININE: 1.13 mg/dL (ref 0.80–1.30)
EGFR CKD-EPI (2021) MALE: 64 mL/min/{1.73_m2} (ref >=60)
GLUCOSE RANDOM: 128 mg/dL (ref 70–179)
POTASSIUM: 5 mmol/L (ref 3.5–5.0)
PROTEIN TOTAL: 5.7 g/dL — ABNORMAL LOW (ref 6.0–8.0)
SODIUM: 125 mmol/L — ABNORMAL LOW (ref 135–145)

## 2021-02-26 LAB — URINALYSIS
BACTERIA: NONE SEEN /HPF
BILIRUBIN UA: NEGATIVE
BLOOD UA: NEGATIVE
GLUCOSE UA: NEGATIVE
KETONES UA: NEGATIVE
LEUKOCYTE ESTERASE UA: NEGATIVE
NITRITE UA: NEGATIVE
PH UA: 5.5 (ref 5.0–8.0)
PROTEIN UA: NEGATIVE
RBC UA: 0 /HPF (ref 0–3)
SPECIFIC GRAVITY UA: 1.01 (ref 1.005–1.030)
SQUAMOUS EPITHELIAL: 1 /HPF (ref 0–5)
UROBILINOGEN UA: 0.2
WBC UA: 1 /HPF (ref 0–2)

## 2021-02-26 LAB — CBC W/ AUTO DIFF
BASOPHILS ABSOLUTE COUNT: 0.1 10*9/L (ref 0.0–0.1)
BASOPHILS RELATIVE PERCENT: 0.3 %
EOSINOPHILS ABSOLUTE COUNT: 0 10*9/L (ref 0.0–0.7)
EOSINOPHILS RELATIVE PERCENT: 0.1 %
HEMATOCRIT: 29.6 % — ABNORMAL LOW (ref 38.0–50.0)
HEMOGLOBIN: 9.8 g/dL — ABNORMAL LOW (ref 13.5–17.5)
LYMPHOCYTES ABSOLUTE COUNT: 17.8 10*9/L — ABNORMAL HIGH (ref 0.7–4.0)
LYMPHOCYTES RELATIVE PERCENT: 78.4 %
MEAN CORPUSCULAR HEMOGLOBIN CONC: 33.1 g/dL (ref 30.0–36.0)
MEAN CORPUSCULAR HEMOGLOBIN: 28.4 pg (ref 26.0–34.0)
MEAN CORPUSCULAR VOLUME: 85.7 fL (ref 81.0–95.0)
MEAN PLATELET VOLUME: 7.2 fL (ref 7.0–10.0)
MONOCYTES ABSOLUTE COUNT: 0.6 10*9/L (ref 0.1–1.0)
MONOCYTES RELATIVE PERCENT: 2.6 %
NEUTROPHILS ABSOLUTE COUNT: 4.2 10*9/L (ref 1.7–7.7)
NEUTROPHILS RELATIVE PERCENT: 18.6 %
PLATELET COUNT: 149 10*9/L — ABNORMAL LOW (ref 150–450)
RED BLOOD CELL COUNT: 3.46 10*12/L — ABNORMAL LOW (ref 4.32–5.72)
RED CELL DISTRIBUTION WIDTH: 13.6 % (ref 12.0–15.0)
WBC ADJUSTED: 22.7 10*9/L — ABNORMAL HIGH (ref 3.5–10.5)

## 2021-02-26 LAB — LACTATE DEHYDROGENASE: LACTATE DEHYDROGENASE: 137 U/L (ref 81–234)

## 2021-02-26 LAB — IGG: GAMMAGLOBULIN; IGG: 113 mg/dL — ABNORMAL LOW (ref 646–2013)

## 2021-02-26 LAB — OSMOLALITY, SERUM: OSMOLALITY MEASURED: 262 mosm/kg — ABNORMAL LOW (ref 275–300)

## 2021-02-26 MED ADMIN — sodium chloride (NS) 0.9 % infusion: 20 mL/h | INTRAVENOUS | @ 15:00:00 | Stop: 2021-02-26

## 2021-02-26 MED ADMIN — diphenhydrAMINE (BENADRYL) capsule/tablet 25 mg: 25 mg | ORAL | @ 14:00:00 | Stop: 2021-02-26

## 2021-02-26 MED ADMIN — acetaminophen (TYLENOL) tablet 650 mg: 650 mg | ORAL | @ 14:00:00 | Stop: 2021-02-26

## 2021-02-26 MED ADMIN — immun glob G(IgG)-pro-IgA 0-50 (PRIVIGEN) 10 % intravenous solution 25 g: 25 g | INTRAVENOUS | @ 15:00:00 | Stop: 2021-02-26

## 2021-02-26 NOTE — Unmapped (Signed)
Patient here for IVIG. Patient had labs drawn and was seen by provider prior to infusion visit. See MAR for premedications given. PIV place with brisk blood return. Infusion completed, tolerated well, VSS throughout visit. Patient was able to tolerate titration of drug per therapy plan/protocol, all the way to the maximum rate, without incident. Patient provided blankets, drinks, and snacks as needed throughout infusion. Comfort continually assessed by staff. PIV flushed and removed per [facility] protocol. Patient discharged in stable condition, ambulatory with cane. Return to clinic information provided.

## 2021-03-01 NOTE — Unmapped (Signed)
HEMATOLOGY ONCOLOGY CLINIC:  INTERVAL FOLLOW UP  NOTE    REFERRING PHYSICIAN:  Sidhu, Clinton Quant,*  PCP:  Mliss Fritz, MD    DATE OF ENCOUNTER:  02/26/2021    INTERVAL HISTORY:  Christian Abbott is an 86 y.o. male who comes today for continued follow-up of chronic lymphocytic leukemia.  Patient was admitted at [facility] for COVID-19 pneumonia from 10/06/2020-10/10/2020.  This was after he presented with fevers and shortness of breath he was treated with Decadron and remdesivir and a CT of his chest was consistent with multifocal pneumonia and showed significant mediastinal adenopathy.  Repeat CT chest without IV contrast on 10/16/2018 performed by Dr. Ace Gins revealed improving COVID-19 pneumonia and prominent mediastinal and hilar nodes which appear to be decreased from the prior exam and on 10/06/2020.  Given his advanced disease he was started on acalbrutinib 12/19/20 weeks ago.      On 02/13/21 he had a follow up CT chest with contrast which noted a mixed response in the pulmonary infiltrates with improvemen tin the RUL and left sided infiltrates but worsening intermittently the LLL infiltrates. However the adenopathy in the chest and a abdomen have improved.     In the last 4 weeks has had more fatigue. No night sweats, weight loss fevers. Mild cough improving. Mild priductive cough with yellowing greenish sputum on occasion. No sob, fevers.          ONCOLOGY HISTORY  Oncology History Overview Note   86 y/o male with long-standing history of chronic lymphocytic leukemia initially diagnosed in 1998  Status post Rituxan based therapy for 8 weeks during that time  History of anemia treated with Procrit off and on  History of hypertension diabetes asthma hypercholesterolemia.  History of hospitalization in January 2014 for bilateral pneumonia [facility] for 3 weeks.     CLL (chronic lymphocytic leukemia) (CMS-HCC)   07/18/1997 Initial Diagnosis    CLL (chronic lymphocytic leukemia)           PAST MEDICAL HISTORY:     Patient Active Problem List   Diagnosis   ??? CLL (chronic lymphocytic leukemia) (CMS-HCC)   ??? Iron deficiency anemia   ??? Hypogammaglobulinemia (CMS-HCC)   ??? Anemia   ??? Need for prophylactic immunotherapy      has no past surgical history on file.    ALLERGIES:  has No Known Allergies.      MEDICATIONS: has a current medication list which includes the following prescription(s): acalabrutinib, accu-chek aviva plus test strp, accu-chek guide me glucose mtr, accu-chek softclix lancets, albuterol 2.5 mg /3 mL (0.083 %) Nebu 3 mL, albuterol 5 mg/mL Nebu 0.5 mL, albuterol, amlodipine, amlodipine-atorvastatin, atorvastatin, atorvastatin, bd ultra-fine short pen needle, chlorhexidine, cholecalciferol (vitamin d3-125 mcg (5,000 unit)), clopidogrel, co-enzyme q-10, co q-10, cyanocobalamin (vitamin b-12), dexamethasone, doxazosin, doxycycline, ergocalciferol-1,250 mcg (50,000 unit), ergocalciferol-1,250 mcg (50,000 unit), ferrous sulfate, furosemide, furosemide, hydralazine, hydralazine, ipratropium-albuterol, jentadueto, levofloxacin, victoza 2-pak, liraglutide, losartan, losartan, metformin, metoprolol su-hydrochlorothiaz, metoprolol succinate, mometasone-formoterol, sitagliptin-metformin, spironolactone, spironolactone, true metrix level 2, victoza 3-pak, bioflex, zafirlukast, and bd alcohol swabs, and the following Facility-Administered Medications: ferric carboxymaltose 750 mg in sodium chloride (NS) 0.9 % 250 mL IVPB.    FAMILY HISTORY / SOCIAL HISTORY:  Reviewed and updated as appropriate.    REVIEW OF SYSTEMS:  As per interval history.  All other systems reviewed and negative.    PHYSICAL EXAM    Vitals: BP 146/86  - Pulse 67  - Temp 36.7 ??C (98 ??F) (Temporal)  -  Resp 16  - Ht 165.1 cm (5' 5)  - Wt 68.2 kg (150 lb 6.4 oz)  - SpO2 99%  - BMI 25.03 kg/m??   General:  Pleasant, no acute distress.    Pain:  Pain Evaluation:                                   Pain Score (0 - 1):                                Pain Location:                                       Eyes:  Appears normal.   HENT:  Atraumatic.   Neck:  thyroid midline.  Lymphatics: Bilateral small cervical lymph nodes.  Supraclavicular axillary or inguinal lymphadenopathy noted   Cardiovascular:  No edema RRR  Respiratory:  Unlabored.  Good air entry b/l. No wheezing.  Mild crackles at the left base  Gastrointestinal: Does not appear distended.  Nontender. No palpable HSM.   Skin:  No rashes or subcutaneous nodules.    Musculoskeletal:  Gait is steady     Psychiatric:  Affect appropriate. Judgment and insight nl.  Neurologic:  Alert and oriented x3. Grossly non-focal.              RECENT LABS/PATHOLOGY:  Results for orders placed or performed in visit on 02/26/21   IgG   Result Value Ref Range    Total IgG 113 (L) 646 - 2,013 mg/dL   Lactate dehydrogenase   Result Value Ref Range    LDH 137 81 - 234 U/L   Comprehensive Metabolic Panel   Result Value Ref Range    Sodium 125 (L) 135 - 145 mmol/L    Potassium 5.0 3.5 - 5.0 mmol/L    Chloride 93 (L) 98 - 107 mmol/L    CO2 26.1 21.0 - 32.0 mmol/L    Anion Gap 6 3 - 11 mmol/L    BUN 29 (H) 8 - 20 mg/dL    Creatinine 1.61 0.96 - 1.30 mg/dL    BUN/Creatinine Ratio 26     eGFR CKD-EPI (2021) Male 64 >=60 mL/min/1.68m2    Glucose 128 70 - 179 mg/dL    Calcium 8.9 8.5 - 04.5 mg/dL    Albumin 3.2 (L) 3.5 - 5.0 g/dL    Total Protein 5.7 (L) 6.0 - 8.0 g/dL    Total Bilirubin 0.4 0.2 - 1.0 mg/dL    AST 16 15 - 40 U/L    ALT 30 12 - 78 U/L    Alkaline Phosphatase 145 (H) 46 - 116 U/L   Osmolality, Serum   Result Value Ref Range    Osmolality Meas 262 (L) 275 - 300 mOsm/kg   Urinalysis   Result Value Ref Range    Color, UA Yellow     Clarity, UA Clear     Specific Gravity, UA 1.010 1.005 - 1.030    pH, UA 5.5 5.0 - 8.0    Leukocyte Esterase, UA Negative Negative    Nitrite, UA Negative Negative    Protein, UA Negative Negative    Glucose, UA Negative Negative    Ketones, UA Negative Negative    Urobilinogen, UA 0.2 mg/dL 0.2 -  2.0 mg/dL    Bilirubin, UA Negative Negative    Blood, UA Negative Negative    RBC, UA 0 0 - 3 /HPF    WBC, UA 1 0 - 2 /HPF    Squam Epithel, UA 1 0 - 5 /HPF    Bacteria, UA None Seen None Seen /HPF   CBC w/ Differential   Result Value Ref Range    Results Verified by Slide Scan Slide Reviewed     WBC 22.7 (H) 3.5 - 10.5 10*9/L    RBC 3.46 (L) 4.32 - 5.72 10*12/L    HGB 9.8 (L) 13.5 - 17.5 g/dL    HCT 28.4 (L) 13.2 - 50.0 %    MCV 85.7 81.0 - 95.0 fL    MCH 28.4 26.0 - 34.0 pg    MCHC 33.1 30.0 - 36.0 g/dL    RDW 44.0 10.2 - 72.5 %    MPV 7.2 7.0 - 10.0 fL    Platelet 149 (L) 150 - 450 10*9/L    Neutrophil Left Shift Not Present Not Present    Neutrophils % 18.6 %    Lymphocytes % 78.4 %    Monocytes % 2.6 %    Eosinophils % 0.1 %    Basophils % 0.3 %    Absolute Neutrophils 4.2 1.7 - 7.7 10*9/L    Absolute Lymphocytes 17.8 (H) 0.7 - 4.0 10*9/L    Absolute Monocytes 0.6 0.1 - 1.0 10*9/L    Absolute Eosinophils 0.0 0.0 - 0.7 10*9/L    Absolute Basophils 0.1 0.0 - 0.1 10*9/L     RADIOLOGY    11/14/2020  PET CT   IMPRESSION:  Patchy consolidative opacities within the lungs, increased from chest CT 10/16/2020, particularly in the lingula with areas FDG avid focus.   This is likely infectious/inflammatory.   Recommend follow-up chest CT in 3 months.  Multiple enlarged lymph nodes in the neck, chest, abdomen and pelvis without significant FDG avidity.   Splenomegaly, without significant FDG avidity      02/13/21 CT chest:      1.  Mixed response of pulmonary infiltrates to interval therapy as detailed above.  The hypermetabolic apicoposterior infiltrate seen previously in the RIGHT upper lobe has largely resolved as have the nodular infiltrates seen previously in the inferior lingula while hypermetabolic infiltrate seen previously in the perihilar and peripheral RIGHT lower lobe is worse.  Follow-up CT the chest is recommended in 3 months.     2.  Interval improvement in mediastinal, bilateral hilar, gastrohepatic ligament, and splenic hilar adenopathy.  The spleen is also slightly smaller.     3.  Stable probably benign 1.7 cm LEFT adrenal mass.  Attention to this on follow-up imaging is indicated.     4.  Moderate to advanced atherosclerosis without aneurysm or dissection.  Multivessel coronary artery disease.  Chronic occlusion of the celiac artery.      ASSESSMENT and PLAN:    # Chronic lymphocytic leukemia  --Rai stage IV CLL  --CLL FISH panel shows 13 q. deletion 21.5%, IGHV mutated -good prognostic features  --11/05/2020: T p53 mutation negative.  Notch 1 -.  SF 3 B1 positive.  SF 3 B1 positivity indicates poor prognosis.  -- 07/2019 DAT, haptoglobin, LDH normal-no hemolysis  --Indication for treatment advanced stage and history of thrombocytopenia .  -- He has been treated with Rituxan many years ago by Dr Thedore Mins, with good response in the 1990s. Treatment ndication at that time may have been frequent  infections.  --I reviewed his recent PET scan.  The mediastinal hilar intra-abdominal lymph nodes are without significant avidity thereby unlikely to have Richter's transformation.  Some of the hilar mediastinal adenopathy may be also reactive to his recent COVID-19 pneumonia.  The patchy opacities within the lung appear to be slightly worse compared to 10/16/2020  -- repeat CT chest 02/13/21 noted above. Overall improvement in infiltrates except for LLL. improvemen tin LAD may be c/w response to Acalbrutinib vs improvement in reactive ALD from prior COVID.   -- started acabrutinib 12/19/20  -- hb stable, platelet improved and ALC declining appropriately from recent expected increase shortly after starting therapy.     PLAN:  -- continue Acalbrutinib 100 mg BID.   -- follow up with Dr Ace Gins regarding LLL infiltrates. Will discuss with Dr Ace Gins regarding whether a bronch is required. However given this may be resudial effects of COVID, will likely observe for now and repeat CT chest in 3 months.     #hyponatremia:  Na 125 today. 01/02/21 Na 138  Hyponatremic in the past in the 130s.   -- likely related to lasix.   -- UA normal.   -- Serum osm low.   -- ordered urine NA and urine osm but inadvertently not sent   -- ordered nephrology referral.   -- recommended follow up with PCP to repeat Na in 1 week.  Patient and grandson aware to call PCP to repeat BMP in 1 week.         #  Iron deficiency anemia-resolved.   -- He was noted to have iron deficiency in 2014 and has since received IV iron in the past and has been on oral iron 1 pill/day.   -- iron deficiency anemia/ACD in 07/2019 with hb down to 8.7 and ferritin 78, iron sat 12%  -- now s/o 1020 mg ferheme 07/2019 and early 10/2018 with hb improvement to 10.9 today.   --EGD and colonoscopy with Dr. Loreta Ave 01/2020 revealed bleeding gastric ulcer, smaller antral ulcer with old blood. .  He has been placed on a PPI by Dr. Loreta Ave.    #. Hypogammaglobulinemia   --History level continues to be low however he has not had any more infections since we started him on IVIG. Before we started him on IVIG he has had multiple hospitalizations for pneumonias.  --Therefore we will continue IVIG for now every 3 months as he is tolerated this well.  --received IVIG 11/05/20. Next dose today.       #. Health maintenance:  -- Evusheld  Received 12/04/20     Elyse Hsu, MD  [facility]      RTC in 1 month    CC:    [place], Elpidio Anis, MD

## 2021-03-06 NOTE — Unmapped (Signed)
[facility] Refill Coordination Note    Specialty Medication(s) to be Shipped:   Hematology/Oncology: Calquence     Other medication(s) to be shipped: No additional medications requested for fill at this time     Christian Abbott, DOB: 1935-03-21  Phone: There are no phone numbers on file.      All above HIPAA information was verified with patient's daughter     Was a Nurse, learning disability used for this call? No    Completed refill call assessment today to schedule patient's medication shipment from the [facility] [phone]).  All relevant notes have been reviewed.     Specialty medication(s) and dose(s) confirmed: Regimen is correct and unchanged.   Changes to medications: Kyros reports no changes at this time.  Changes to insurance: No  New side effects reported not previously addressed with a pharmacist or physician: None reported  Questions for the pharmacist: No    Confirmed patient received a Conservation officer, historic buildings and a Surveyor, mining with first shipment. The patient will receive a drug information handout for each medication shipped and additional FDA Medication Guides as required.       DISEASE/MEDICATION-SPECIFIC INFORMATION        N/A    SPECIALTY MEDICATION ADHERENCE     Medication Adherence    Patient reported X missed doses in the last month: 0  Specialty Medication: Calquence 100mg   Patient is on additional specialty medications: No  Informant: child/children              Were doses missed due to medication being on hold? No    Calquence 100 mg: 14 days of medicine on hand        REFERRAL TO PHARMACIST     Referral to the pharmacist: Not needed      [facility]     Shipping address confirmed in Epic.     Delivery Scheduled: Yes, Expected medication delivery date: 03/13/21.     Medication will be delivered via Next Day Courier to the temporary address in Epic WAM.    Christian Abbott   [facility] Specialty Technician

## 2021-03-27 NOTE — Unmapped (Signed)
Oral Chemotherapy: Calquence (Acalabrutinib)   Dose: 100 mg  Frequency: BID    Specialty Pharmacy: [facility]     Financial Assistance: None    Last Refill Date: 02/24/21  Number of refills sent: 5 with 30 day supply    Other Pertinent Info: None    PA Dates:08/30/20-08/29/21

## 2021-04-07 NOTE — Unmapped (Signed)
[facility] Refill Coordination Note    Specialty Medication(s) to be Shipped:   Hematology/Oncology: Calquence    Other medication(s) to be shipped: No additional medications requested for fill at this time     Christian Abbott, DOB: [birth year]  Phone: There are no phone numbers on file.      All above HIPAA information was verified with patient's daughter     Was a Nurse, learning disability used for this call? No    Completed refill call assessment today to schedule patient's medication shipment from the [facility] [phone]).  All relevant notes have been reviewed.     Specialty medication(s) and dose(s) confirmed: Regimen is correct and unchanged.   Changes to medications: [name] reports no changes at this time.  Changes to insurance: No  New side effects reported not previously addressed with a pharmacist or physician: None reported  Questions for the pharmacist: No    Confirmed patient received a Conservation officer, historic buildings and a Surveyor, mining with first shipment. The patient will receive a drug information handout for each medication shipped and additional FDA Medication Guides as required.       DISEASE/MEDICATION-SPECIFIC INFORMATION        N/A    SPECIALTY MEDICATION ADHERENCE     Medication Adherence    Patient reported X missed doses in the last month: 0  Specialty Medication: Calquence 100mg   Patient is on additional specialty medications: No  Informant: child/children              Were doses missed due to medication being on hold? No    Calquence 100 mg: 12 days of medicine on hand       REFERRAL TO PHARMACIST     Referral to the pharmacist: Not needed      [name]     Shipping address confirmed in Epic.     Delivery Scheduled: Yes, Expected medication delivery date: 04/15/21.     Medication will be delivered via Next Day Courier to the prescription address in Epic WAM.    [name]   [facility] Specialty Technician

## 2021-04-09 NOTE — Unmapped (Signed)
Amb Referral to Nephrology routed to [facility] Nephrology scheduling pool via epic.

## 2021-04-14 MED FILL — CALQUENCE 100 MG CAPSULE: ORAL | 30 days supply | Qty: 60 | Fill #1

## 2021-04-15 DIAGNOSIS — C9110 Chronic lymphocytic leukemia of B-cell type not having achieved remission: Principal | ICD-10-CM

## 2021-04-15 DIAGNOSIS — D649 Anemia, unspecified: Principal | ICD-10-CM

## 2021-04-16 ENCOUNTER — Ambulatory Visit: Admit: 2021-04-16 | Discharge: 2021-04-17 | Payer: MEDICARE

## 2021-04-16 DIAGNOSIS — E871 Hypo-osmolality and hyponatremia: Principal | ICD-10-CM

## 2021-04-16 DIAGNOSIS — C9110 Chronic lymphocytic leukemia of B-cell type not having achieved remission: Principal | ICD-10-CM

## 2021-04-16 DIAGNOSIS — D801 Nonfamilial hypogammaglobulinemia: Principal | ICD-10-CM

## 2021-04-16 DIAGNOSIS — D508 Other iron deficiency anemias: Principal | ICD-10-CM

## 2021-04-16 DIAGNOSIS — D649 Anemia, unspecified: Principal | ICD-10-CM

## 2021-04-16 LAB — CBC W/ AUTO DIFF
BASOPHILS ABSOLUTE COUNT: 0 10*9/L (ref 0.0–0.1)
BASOPHILS RELATIVE PERCENT: 0.2 %
EOSINOPHILS ABSOLUTE COUNT: 0 10*9/L (ref 0.0–0.7)
EOSINOPHILS RELATIVE PERCENT: 0.3 %
HEMATOCRIT: 30.2 % — ABNORMAL LOW (ref 38.0–50.0)
HEMOGLOBIN: 10 g/dL — ABNORMAL LOW (ref 13.5–17.5)
LYMPHOCYTES ABSOLUTE COUNT: 9.9 10*9/L — ABNORMAL HIGH (ref 0.7–4.0)
LYMPHOCYTES RELATIVE PERCENT: 77.4 %
MEAN CORPUSCULAR HEMOGLOBIN CONC: 32.9 g/dL (ref 30.0–36.0)
MEAN CORPUSCULAR HEMOGLOBIN: 28.4 pg (ref 26.0–34.0)
MEAN CORPUSCULAR VOLUME: 86.1 fL (ref 81.0–95.0)
MEAN PLATELET VOLUME: 7.6 fL (ref 7.0–10.0)
MONOCYTES ABSOLUTE COUNT: 0.7 10*9/L (ref 0.1–1.0)
MONOCYTES RELATIVE PERCENT: 5.3 %
NEUTROPHILS ABSOLUTE COUNT: 2.2 10*9/L (ref 1.7–7.7)
NEUTROPHILS RELATIVE PERCENT: 16.8 %
PLATELET COUNT: 144 10*9/L — ABNORMAL LOW (ref 150–450)
RED BLOOD CELL COUNT: 3.51 10*12/L — ABNORMAL LOW (ref 4.32–5.72)
RED CELL DISTRIBUTION WIDTH: 16.1 % — ABNORMAL HIGH (ref 12.0–15.0)
WBC ADJUSTED: 12.8 10*9/L — ABNORMAL HIGH (ref 3.5–10.5)

## 2021-04-16 LAB — FERRITIN: FERRITIN: 146.5 ng/mL (ref 10.5–307.3)

## 2021-04-16 LAB — IRON & TIBC
IRON SATURATION: 16 % — ABNORMAL LOW (ref 20–55)
IRON: 41 ug/dL — ABNORMAL LOW (ref 50–150)
TOTAL IRON BINDING CAPACITY: 253 ug/dL (ref 250–450)

## 2021-04-16 LAB — COMPREHENSIVE METABOLIC PANEL
ALBUMIN: 3.4 g/dL — ABNORMAL LOW (ref 3.5–5.0)
ALKALINE PHOSPHATASE: 143 U/L — ABNORMAL HIGH (ref 46–116)
ALT (SGPT): 31 U/L (ref 12–78)
ANION GAP: 9 mmol/L (ref 3–11)
AST (SGOT): 16 U/L (ref 15–40)
BILIRUBIN TOTAL: 0.3 mg/dL (ref 0.2–1.0)
BLOOD UREA NITROGEN: 47 mg/dL — ABNORMAL HIGH (ref 8–20)
BUN / CREAT RATIO: 37
CALCIUM: 8.8 mg/dL (ref 8.5–10.1)
CHLORIDE: 102 mmol/L (ref 98–107)
CO2: 25.5 mmol/L (ref 21.0–32.0)
CREATININE: 1.26 mg/dL (ref 0.80–1.30)
EGFR CKD-EPI (2021) MALE: 56 mL/min/{1.73_m2} — ABNORMAL LOW (ref >=60)
GLUCOSE RANDOM: 137 mg/dL (ref 70–179)
POTASSIUM: 4.7 mmol/L (ref 3.5–5.0)
PROTEIN TOTAL: 5.7 g/dL — ABNORMAL LOW (ref 6.0–8.0)
SODIUM: 136 mmol/L (ref 135–145)

## 2021-04-16 LAB — SLIDE REVIEW

## 2021-04-16 LAB — LACTATE DEHYDROGENASE: LACTATE DEHYDROGENASE: 142 U/L (ref 81–234)

## 2021-04-16 NOTE — Unmapped (Signed)
HEMATOLOGY ONCOLOGY CLINIC:  INTERVAL FOLLOW UP  NOTE    REFERRING PHYSICIAN:  Sidhu, Clinton Quant,*  PCP:  Mliss Fritz, MD    DATE OF ENCOUNTER:  04/16/2021    INTERVAL HISTORY:  Christian Abbott is an 86 y.o. male who comes today for continued follow-up of chronic lymphocytic leukemia.  Patient was admitted at [facility] for COVID-19 pneumonia from 10/06/2020-10/10/2020.  This was after he presented with fevers and shortness of breath he was treated with Decadron and remdesivir and a CT of his chest was consistent with multifocal pneumonia and showed significant mediastinal adenopathy.  Repeat CT chest without IV contrast on 10/16/2018 performed by Dr. Ace Gins revealed improving COVID-19 pneumonia and prominent mediastinal and hilar nodes which appear to be decreased from the prior exam and on 10/06/2020.  Given his advanced disease he was started on acalbrutinib 12/19/20.      On 02/13/21 he had a follow up CT chest with contrast which noted a mixed response in the pulmonary infiltrates with improvemen tin the RUL and left sided infiltrates but worsening intermittently the LLL infiltrates. However the adenopathy in the chest and a abdomen have improved.     Patient is scheduled for repeat CT chest on 05/14/2021.  Transfer text reports ongoing fatigue although feels energy levels have improved since about 6 weeks ago.    ONCOLOGY HISTORY  Oncology History Overview Note   86 y/o male with long-standing history of chronic lymphocytic leukemia initially diagnosed in 1998  Status post Rituxan based therapy for 8 weeks during that time  History of anemia treated with Procrit off and on  History of hypertension diabetes asthma hypercholesterolemia.  History of hospitalization in January 2014 for bilateral pneumonia [facility] for 3 weeks.     CLL (chronic lymphocytic leukemia) (CMS-HCC)   07/18/1997 Initial Diagnosis    CLL (chronic lymphocytic leukemia)           PAST MEDICAL HISTORY:     Patient Active Problem List   Diagnosis   ??? CLL (chronic lymphocytic leukemia) (CMS-HCC)   ??? Iron deficiency anemia   ??? Hypogammaglobulinemia (CMS-HCC)   ??? Anemia   ??? Need for prophylactic immunotherapy      has no past surgical history on file.    ALLERGIES:  has No Known Allergies.      MEDICATIONS: has a current medication list which includes the following prescription(s): acalabrutinib, accu-chek aviva plus test strp, accu-chek guide me glucose mtr, accu-chek softclix lancets, albuterol 2.5 mg /3 mL (0.083 %) Nebu 3 mL, albuterol 5 mg/mL Nebu 0.5 mL, albuterol, amlodipine, amlodipine-atorvastatin, atorvastatin, atorvastatin, bd alcohol swabs, bd ultra-fine short pen needle, chlorhexidine, cholecalciferol (vitamin d3-125 mcg (5,000 unit)), clopidogrel, cyanocobalamin (vitamin b-12), dexamethasone, doxazosin, ergocalciferol-1,250 mcg (50,000 unit), ergocalciferol-1,250 mcg (50,000 unit), ferrous sulfate, furosemide, hydralazine, hydralazine, ipratropium-albuterol, jentadueto, levofloxacin, victoza 2-pak, liraglutide, losartan, losartan, metformin, metoprolol su-hydrochlorothiaz, metoprolol succinate, mometasone-formoterol, sitagliptin-metformin, spironolactone, spironolactone, true metrix level 2, victoza 3-pak, zafirlukast, co-enzyme q-10, co q-10, doxycycline, furosemide, and bioflex, and the following Facility-Administered Medications: ferric carboxymaltose 750 mg in sodium chloride (NS) 0.9 % 250 mL IVPB.    FAMILY HISTORY / SOCIAL HISTORY:  Reviewed and updated as appropriate.    REVIEW OF SYSTEMS:  As per interval history.  All other systems reviewed and negative.    PHYSICAL EXAM    Vitals: BP 133/64  - Pulse 70  - Temp 36.8 ??C (98.2 ??F) (Temporal)  - Resp 16  - Ht 165.1 cm (5' 5)  - Wt  68.7 kg (151 lb 8 oz)  - SpO2 98%  - BMI 25.21 kg/m??   General:  Pleasant, no acute distress.    Pain:  Pain Evaluation:                                   Pain Score (0 - 1):                                Pain Location: Eyes:  Appears normal.   HENT:  Atraumatic.   Neck:  thyroid midline.  Lymphatics: Bilateral small cervical lymph nodes.  Supraclavicular axillary or inguinal lymphadenopathy noted   Cardiovascular:  No edema RRR  Respiratory:  Unlabored.  Good air entry b/l. No wheezing.  Mild crackles at the left base  Gastrointestinal: Does not appear distended.  Nontender. No palpable HSM.   Skin:  No rashes or subcutaneous nodules.    Musculoskeletal:  Gait is steady     Psychiatric:  Affect appropriate. Judgment and insight nl.  Neurologic:  Alert and oriented x3. Grossly non-focal.              RECENT LABS/PATHOLOGY:  Results for orders placed or performed in visit on 04/16/21   CBC w/ Differential   Result Value Ref Range    WBC 12.8 (H) 3.5 - 10.5 10*9/L    RBC 3.51 (L) 4.32 - 5.72 10*12/L    HGB 10.0 (L) 13.5 - 17.5 g/dL    HCT 16.1 (L) 09.6 - 50.0 %    MCV 86.1 81.0 - 95.0 fL    MCH 28.4 26.0 - 34.0 pg    MCHC 32.9 30.0 - 36.0 g/dL    RDW 04.5 (H) 40.9 - 15.0 %    MPV 7.6 7.0 - 10.0 fL    Platelet 144 (L) 150 - 450 10*9/L     RADIOLOGY    11/14/2020  PET CT   IMPRESSION:  Patchy consolidative opacities within the lungs, increased from chest CT 10/16/2020, particularly in the lingula with areas FDG avid focus.   This is likely infectious/inflammatory.   Recommend follow-up chest CT in 3 months.  Multiple enlarged lymph nodes in the neck, chest, abdomen and pelvis without significant FDG avidity.   Splenomegaly, without significant FDG avidity      02/13/21 CT chest:      1.  Mixed response of pulmonary infiltrates to interval therapy as detailed above.  The hypermetabolic apicoposterior infiltrate seen previously in the RIGHT upper lobe has largely resolved as have the nodular infiltrates seen previously in the inferior lingula while hypermetabolic infiltrate seen previously in the perihilar and peripheral RIGHT lower lobe is worse.  Follow-up CT the chest is recommended in 3 months.     2.  Interval improvement in mediastinal, bilateral hilar, gastrohepatic ligament, and splenic hilar adenopathy.  The spleen is also slightly smaller.     3.  Stable probably benign 1.7 cm LEFT adrenal mass.  Attention to this on follow-up imaging is indicated.     4.  Moderate to advanced atherosclerosis without aneurysm or dissection.  Multivessel coronary artery disease.  Chronic occlusion of the celiac artery.      ASSESSMENT and PLAN:    # Chronic lymphocytic leukemia  --Rai stage IV CLL  --CLL FISH panel shows 13 q. deletion 21.5%, IGHV mutated -good prognostic features  --11/05/2020: T  p53 mutation negative.  Notch 1 -.  SF 3 B1 positive.  SF 3 B1 positivity indicates poor prognosis.  -- 07/2019 DAT, haptoglobin, LDH normal-no hemolysis  --Indication for treatment advanced stage and history of thrombocytopenia .  -- He has been treated with Rituxan many years ago by Dr Thedore Mins, with good response in the 1990s. Treatment ndication at that time may have been frequent infections.  --I reviewed his recent PET scan.  The mediastinal hilar intra-abdominal lymph nodes are without significant avidity thereby unlikely to have Richter's transformation.  Some of the hilar mediastinal adenopathy may be also reactive to his recent COVID-19 pneumonia.  The patchy opacities within the lung appear to be slightly worse compared to 10/16/2020  -- repeat CT chest 02/13/21 noted above. Overall improvement in infiltrates except for LLL. improvemen tin LAD may be c/w response to Acalbrutinib vs improvement in reactive ALD from prior COVID.   -- started acabrutinib 12/19/20  -- hb stable, platelet improved and ALC declining appropriately     PLAN:  -- continue Acalbrutinib 100 mg BID.   --repeating CT chest 1 month for follow up of  Pulmonary infiltrates.        #  Iron deficiency anemia-resolved.   -- He was noted to have iron deficiency in 2014 and has since received IV iron in the past and has been on oral iron 1 pill/day.   -- iron deficiency anemia/ACD in 07/2019 with hb down to 8.7 and ferritin 78, iron sat 12%  -- now s/o 1020 mg ferheme 07/2019 and early 10/2018 with hb improvement to 10.9 today.   --EGD and colonoscopy with Dr. Loreta Ave 01/2020 revealed bleeding gastric ulcer, smaller antral ulcer with old blood. .  He has been placed on a PPI by Dr. Loreta Ave.    #. Hypogammaglobulinemia   --History level continues to be low however he has not had any more infections since we started him on IVIG. Before we started him on IVIG he has had multiple hospitalizations for pneumonias.  --Therefore we will continue IVIG for now every 3 months as he is tolerated this well.  --received IVIG  01/2021  - next dose in 04/2021 in 1 month       #. Health maintenance:  -- Evusheld  Received 12/04/20     Elyse Hsu, MD  [facility]      RTC in 1 month    CC:    [place], Elpidio Anis, MD

## 2021-04-16 NOTE — Unmapped (Signed)
Your labs from today:     Ref. Range 04/16/2021 15:20   WBC Latest Ref Range: 3.5 - 10.5 10*9/L 12.8 (H)   RBC Latest Ref Range: 4.32 - 5.72 10*12/L 3.51 (L)   HGB Latest Ref Range: 13.5 - 17.5 g/dL 16.1 (L)   HCT Latest Ref Range: 38.0 - 50.0 % 30.2 (L)   MCV Latest Ref Range: 81.0 - 95.0 fL 86.1   MCH Latest Ref Range: 26.0 - 34.0 pg 28.4   MCHC Latest Ref Range: 30.0 - 36.0 g/dL 09.6   RDW Latest Ref Range: 12.0 - 15.0 % 16.1 (H)   MPV Latest Ref Range: 7.0 - 10.0 fL 7.6   Platelet Latest Ref Range: 150 - 450 10*9/L 144 (L)   Neutrophils % Latest Units: % 16.8   Lymphocytes % Latest Units: % 77.4   Monocytes % Latest Units: % 5.3   Eosinophils % Latest Units: % 0.3   Basophils % Latest Units: % 0.2   Absolute Neutrophils Latest Ref Range: 1.7 - 7.7 10*9/L 2.2   Absolute Lymphocytes Latest Ref Range: 0.7 - 4.0 10*9/L 9.9 (H)   Absolute Monocytes  Latest Ref Range: 0.1 - 1.0 10*9/L 0.7   Absolute Eosinophils Latest Ref Range: 0.0 - 0.7 10*9/L 0.0   Absolute Basophils  Latest Ref Range: 0.0 - 0.1 10*9/L 0.0   Rouleaux Latest Ref Range: Not Present  Present (A)   Burr Cells Latest Ref Range: Not Present  Present (A)   Sodium Latest Ref Range: 135 - 145 mmol/L 136   Potassium Latest Ref Range: 3.5 - 5.0 mmol/L 4.7   Chloride Latest Ref Range: 98 - 107 mmol/L 102   CO2 Latest Ref Range: 21.0 - 32.0 mmol/L 25.5   Bun Latest Ref Range: 8 - 20 mg/dL 47 (H)   Creatinine Latest Ref Range: 0.80 - 1.30 mg/dL 0.45   BUN/Creatinine Ratio Unknown 37   eGFR CKD-EPI (2021) Male Latest Ref Range: >=60 mL/min/1.34m2 56 (L)   Anion Gap Latest Ref Range: 3 - 11 mmol/L 9   Glucose Latest Ref Range: 70 - 179 mg/dL 409   Calcium Latest Ref Range: 8.5 - 10.1 mg/dL 8.8   Albumin Latest Ref Range: 3.5 - 5.0 g/dL 3.4 (L)   Total Protein Latest Ref Range: 6.0 - 8.0 g/dL 5.7 (L)   Total Bilirubin Latest Ref Range: 0.2 - 1.0 mg/dL 0.3   AST Latest Ref Range: 15 - 40 U/L 16   ALT Latest Ref Range: 12 - 78 U/L 31   Alkaline Phosphatase Latest Ref Range: 46 - 116 U/L 143 (H)   Iron Latest Ref Range: 50 - 150 ug/dL 41 (L)   TIBC Latest Ref Range: 250 - 450 ug/dL 811   Iron Saturation (%) Latest Ref Range: 20 - 55 % 16 (L)   LDH Latest Ref Range: 81 - 234 U/L 142

## 2021-04-22 NOTE — Unmapped (Signed)
Called pt's daughter  to confirm appointments and she stated Flagg is having issues with heartburn.  Endocrinologist advised taking Pepcid daily but she's afraid it will interfere with the Calquence.  She was also asking about Sucralfate suspension to see if that was something he could take. Please call Lissa Hoard to advise.

## 2021-04-23 NOTE — Unmapped (Signed)
Patient's daughter called and asked about which medications are ok for heartburn while on acalabrutinib.      Called and spoke to the patient, Christian Abbott, and explained that he could take famotidine (Pepcid) 2 hours after taking his acalabrutinib.      Patient repeated this back to me and understood how to take the two medications to avoid the interaction.      Janell Quiet, PharmD

## 2021-05-08 NOTE — Unmapped (Signed)
[facility] Refill Coordination Note    Specialty Medication(s) to be Shipped:   Hematology/Oncology: Calquence    Other medication(s) to be shipped: No additional medications requested for fill at this time     Christian Abbott, DOB: 03/29/1935  Phone: There are no phone numbers on file.      All above HIPAA information was verified with patient's family member, daughter.     Was a Nurse, learning disability used for this call? No    Completed refill call assessment today to schedule patient's medication shipment from the [facility] [phone]).  All relevant notes have been reviewed.     Specialty medication(s) and dose(s) confirmed: Regimen is correct and unchanged.   Changes to medications: Undrea reports no changes at this time.  Changes to insurance: No  New side effects reported not previously addressed with a pharmacist or physician: None reported  Questions for the pharmacist: No    Confirmed patient received a Conservation officer, historic buildings and a Surveyor, mining with first shipment. The patient will receive a drug information handout for each medication shipped and additional FDA Medication Guides as required.       DISEASE/MEDICATION-SPECIFIC INFORMATION        N/A    SPECIALTY MEDICATION ADHERENCE     Medication Adherence    Patient reported X missed doses in the last month: 0  Specialty Medication: Calquence 100mg   Patient is on additional specialty medications: No  Patient is on more than two specialty medications: No  Any gaps in refill history greater than 2 weeks in the last 3 months: no  Demonstrates understanding of importance of adherence: yes  Informant: child/children  Reliability of informant: reliable  Provider-estimated medication adherence level: good  Patient is at risk for Non-Adherence: No  Reasons for non-adherence: no problems identified              Were doses missed due to medication being on hold? No    Calaquence 100 mg: 10 days of medicine on hand REFERRAL TO PHARMACIST     Referral to the pharmacist: Not needed      [facility]     Shipping address confirmed in Epic.     Delivery Scheduled: Yes, Expected medication delivery date: 09/14.     Medication will be delivered via Next Day Courier to the prescription address in Epic WAM.    Antonietta Barcelona   [facility] Specialty Technician

## 2021-05-11 NOTE — Unmapped (Signed)
Pt daughter has called on several occasions asking abut heartburn meds and calquence.  She has been counceled by [facility] concerning this and still has questions.    Routed RJ

## 2021-05-12 MED ORDER — SUCRALFATE 100 MG/ML ORAL SUSPENSION
Freq: Two times a day (BID) | ORAL | 0 refills | 21 days | Status: CP
Start: 2021-05-12 — End: 2021-06-11

## 2021-05-12 MED FILL — CALQUENCE 100 MG CAPSULE: ORAL | 30 days supply | Qty: 60 | Fill #2

## 2021-05-13 NOTE — Unmapped (Signed)
I spoke with patient and daughter today. They informed me that for the lastthree weeks he has had signficiant issues with GERD at night . They called  And spoke with pharmacist on 8/26 and pharmacist recommended famotidine BID  2 hr after taking acalbrutinib.   He then called back multiple tiems and spoke with the team hear and heard that he should try sucralfate but these calls wee not communicated with me. Cannot be on PPI due to decreased absorption for acalrutinib.     I recommended trying sucralfate  Liquid suspension taking 2 hours after acalbrutinib  In the morning and evening,   BID.  He will also use Tums, Peptosimol as needed also 2 hours after taking acalbrutinib.     daughter will let us know if this is not helping.     Elyse Hsu, MD  [facility]

## 2021-05-14 ENCOUNTER — Ambulatory Visit: Admit: 2021-05-14 | Discharge: 2021-05-15 | Payer: MEDICARE

## 2021-05-21 ENCOUNTER — Ambulatory Visit: Admit: 2021-05-21 | Discharge: 2021-05-22 | Payer: MEDICARE

## 2021-05-21 LAB — CBC W/ AUTO DIFF
BASOPHILS ABSOLUTE COUNT: 0 10*9/L (ref 0.0–0.1)
BASOPHILS RELATIVE PERCENT: 0.3 %
EOSINOPHILS ABSOLUTE COUNT: 0.1 10*9/L (ref 0.0–0.7)
EOSINOPHILS RELATIVE PERCENT: 0.7 %
HEMATOCRIT: 27.4 % — ABNORMAL LOW (ref 38.0–50.0)
HEMOGLOBIN: 9.1 g/dL — ABNORMAL LOW (ref 13.5–17.5)
LYMPHOCYTES ABSOLUTE COUNT: 7.3 10*9/L — ABNORMAL HIGH (ref 0.7–4.0)
LYMPHOCYTES RELATIVE PERCENT: 65 %
MEAN CORPUSCULAR HEMOGLOBIN CONC: 33.3 g/dL (ref 30.0–36.0)
MEAN CORPUSCULAR HEMOGLOBIN: 28.2 pg (ref 26.0–34.0)
MEAN CORPUSCULAR VOLUME: 84.8 fL (ref 81.0–95.0)
MEAN PLATELET VOLUME: 7.7 fL (ref 7.0–10.0)
MONOCYTES ABSOLUTE COUNT: 0.6 10*9/L (ref 0.1–1.0)
MONOCYTES RELATIVE PERCENT: 5.4 %
NEUTROPHILS ABSOLUTE COUNT: 3.2 10*9/L (ref 1.7–7.7)
NEUTROPHILS RELATIVE PERCENT: 28.6 %
PLATELET COUNT: 120 10*9/L — ABNORMAL LOW (ref 150–450)
RED BLOOD CELL COUNT: 3.23 10*12/L — ABNORMAL LOW (ref 4.32–5.72)
RED CELL DISTRIBUTION WIDTH: 14.7 % (ref 12.0–15.0)
WBC ADJUSTED: 11.2 10*9/L — ABNORMAL HIGH (ref 3.5–10.5)

## 2021-05-21 LAB — COMPREHENSIVE METABOLIC PANEL
ALBUMIN: 3.3 g/dL — ABNORMAL LOW (ref 3.5–5.0)
ALKALINE PHOSPHATASE: 135 U/L — ABNORMAL HIGH (ref 46–116)
ALT (SGPT): 24 U/L (ref 12–78)
ANION GAP: 9 mmol/L (ref 3–11)
AST (SGOT): 15 U/L (ref 15–40)
BILIRUBIN TOTAL: 0.4 mg/dL (ref 0.2–1.0)
BLOOD UREA NITROGEN: 38 mg/dL — ABNORMAL HIGH (ref 8–20)
BUN / CREAT RATIO: 28
CALCIUM: 8.8 mg/dL (ref 8.5–10.1)
CHLORIDE: 94 mmol/L — ABNORMAL LOW (ref 98–107)
CO2: 22.8 mmol/L (ref 21.0–32.0)
CREATININE: 1.35 mg/dL — ABNORMAL HIGH (ref 0.80–1.30)
EGFR CKD-EPI (2021) MALE: 52 mL/min/{1.73_m2} — ABNORMAL LOW (ref >=60)
GLUCOSE RANDOM: 132 mg/dL (ref 70–179)
POTASSIUM: 4.9 mmol/L (ref 3.5–5.0)
PROTEIN TOTAL: 5.6 g/dL — ABNORMAL LOW (ref 6.0–8.0)
SODIUM: 126 mmol/L — ABNORMAL LOW (ref 135–145)

## 2021-05-21 LAB — IRON & TIBC
IRON SATURATION: 13 % — ABNORMAL LOW (ref 20–55)
IRON: 36 ug/dL — ABNORMAL LOW (ref 50–150)
TOTAL IRON BINDING CAPACITY: 268 ug/dL (ref 250–450)

## 2021-05-21 LAB — FERRITIN: FERRITIN: 124.9 ng/mL (ref 10.5–307.3)

## 2021-05-21 MED ORDER — SUCRALFATE 100 MG/ML ORAL SUSPENSION
Freq: Two times a day (BID) | ORAL | 5 refills | 21.00000 days | Status: CP
Start: 2021-05-21 — End: 2021-06-20

## 2021-05-22 LAB — IGG: GAMMAGLOBULIN; IGG: 186 mg/dL — ABNORMAL LOW (ref 646–2013)

## 2021-05-22 NOTE — Unmapped (Signed)
Your 2 recent labs from last month and today:     Ref. Range 04/16/2021 15:20 05/21/2021 15:31   WBC Latest Ref Range: 3.5 - 10.5 10*9/L 12.8 (H) 11.2 (H)   RBC Latest Ref Range: 4.32 - 5.72 10*12/L 3.51 (L) 3.23 (L)   HGB Latest Ref Range: 13.5 - 17.5 g/dL 09.8 (L) 9.1 (L)   HCT Latest Ref Range: 38.0 - 50.0 % 30.2 (L) 27.4 (L)   MCV Latest Ref Range: 81.0 - 95.0 fL 86.1 84.8   MCH Latest Ref Range: 26.0 - 34.0 pg 28.4 28.2   MCHC Latest Ref Range: 30.0 - 36.0 g/dL 11.9 14.7   RDW Latest Ref Range: 12.0 - 15.0 % 16.1 (H) 14.7   MPV Latest Ref Range: 7.0 - 10.0 fL 7.6 7.7   Platelet Latest Ref Range: 150 - 450 10*9/L 144 (L) 120 (L)   Neutrophils % Latest Units: % 16.8 28.6   Lymphocytes % Latest Units: % 77.4 65.0   Monocytes % Latest Units: % 5.3 5.4   Eosinophils % Latest Units: % 0.3 0.7   Basophils % Latest Units: % 0.2 0.3   Absolute Neutrophils Latest Ref Range: 1.7 - 7.7 10*9/L 2.2 3.2   Absolute Lymphocytes Latest Ref Range: 0.7 - 4.0 10*9/L 9.9 (H) 7.3 (H)   Absolute Monocytes  Latest Ref Range: 0.1 - 1.0 10*9/L 0.7 0.6   Absolute Eosinophils Latest Ref Range: 0.0 - 0.7 10*9/L 0.0 0.1   Absolute Basophils  Latest Ref Range: 0.0 - 0.1 10*9/L 0.0 0.0   Rouleaux Latest Ref Range: Not Present  Present (A)    Burr Cells Latest Ref Range: Not Present  Present (A)    Sodium Latest Ref Range: 135 - 145 mmol/L 136 126 (L)   Potassium Latest Ref Range: 3.5 - 5.0 mmol/L 4.7 4.9   Chloride Latest Ref Range: 98 - 107 mmol/L 102 94 (L)   CO2 Latest Ref Range: 21.0 - 32.0 mmol/L 25.5 22.8   Bun Latest Ref Range: 8 - 20 mg/dL 47 (H) 38 (H)   Creatinine Latest Ref Range: 0.80 - 1.30 mg/dL 8.29 5.62 (H)   BUN/Creatinine Ratio Unknown 37 28   eGFR CKD-EPI (2021) Male Latest Ref Range: >=60 mL/min/1.66m2 56 (L) 52 (L)   Anion Gap Latest Ref Range: 3 - 11 mmol/L 9 9   Glucose Latest Ref Range: 70 - 179 mg/dL 130 865   Calcium Latest Ref Range: 8.5 - 10.1 mg/dL 8.8 8.8   Albumin Latest Ref Range: 3.5 - 5.0 g/dL 3.4 (L) 3.3 (L) Total Protein Latest Ref Range: 6.0 - 8.0 g/dL 5.7 (L) 5.6 (L)   Total Bilirubin Latest Ref Range: 0.2 - 1.0 mg/dL 0.3 0.4   AST Latest Ref Range: 15 - 40 U/L 16 15   ALT Latest Ref Range: 12 - 78 U/L 31 24   Alkaline Phosphatase Latest Ref Range: 46 - 116 U/L 143 (H) 135 (H)   Iron Latest Ref Range: 50 - 150 ug/dL 41 (L) 36 (L)   TIBC Latest Ref Range: 250 - 450 ug/dL 784 696   Iron Saturation (%) Latest Ref Range: 20 - 55 % 16 (L) 13 (L)   Ferritin Latest Ref Range: 10.5 - 307.3 ng/mL 146.5    LDH Latest Ref Range: 81 - 234 U/L 142

## 2021-05-24 NOTE — Unmapped (Signed)
HEMATOLOGY ONCOLOGY CLINIC:  INTERVAL FOLLOW UP  NOTE    REFERRING PHYSICIAN:  Sidhu, Clinton Quant,*  PCP:  Mliss Fritz, MD    DATE OF ENCOUNTER:  05/21/2021    INTERVAL HISTORY:  Christian Abbott is an 86 y.o. male who comes today for continued follow-up of chronic lymphocytic leukemia.  Patient was admitted at [facility] for COVID-19 pneumonia from 10/06/2020-10/10/2020.  This was after he presented with fevers and shortness of breath he was treated with Decadron and remdesivir and a CT of his chest was consistent with multifocal pneumonia and showed significant mediastinal adenopathy.  Repeat CT chest without IV contrast on 10/16/2018 performed by Dr. Ace Gins revealed improving COVID-19 pneumonia and prominent mediastinal and hilar nodes which appear to be decreased from the prior exam and on 10/06/2020.  Given his advanced disease he was started on acalbrutinib 12/19/20.      On 02/13/21 he had a follow up CT chest with contrast which noted a mixed response in the pulmonary infiltrates with improvemen tin the RUL and left sided infiltrates but worsening intermittently the LLL infiltrates. However the adenopathy in the chest and a abdomen have improved.     Patient underwent a CT chest on 05/14/2021 noted below.     Report ongoing fatigue . Gradually worsening. No CP, Cough, sob. No new LAD, abd distension, unintentional weight loss.      in the last 2 months he has had significant issues with heart burn as he had stopped his PPI. We recently started him on Sucralfate suspension BID which he takes ~ 3 hr after the mornign and evening dose of acalbrutinib. Reflux and abdominal pain at night has resolved since using sucralfate.           ONCOLOGY HISTORY  Oncology History Overview Note   86 y/o male with long-standing history of chronic lymphocytic leukemia initially diagnosed in 1998  Status post Rituxan based therapy for 8 weeks during that time  History of anemia treated with Procrit off and on  History of hypertension diabetes asthma hypercholesterolemia.  History of hospitalization in January 2014 for bilateral pneumonia [facility] for 3 weeks.     CLL (chronic lymphocytic leukemia) (CMS-HCC)   07/18/1997 Initial Diagnosis    CLL (chronic lymphocytic leukemia)           PAST MEDICAL HISTORY:     Patient Active Problem List   Diagnosis   ??? CLL (chronic lymphocytic leukemia) (CMS-HCC)   ??? Iron deficiency anemia   ??? Hypogammaglobulinemia (CMS-HCC)   ??? Anemia   ??? Need for prophylactic immunotherapy      has no past surgical history on file.    ALLERGIES:  has No Known Allergies.      MEDICATIONS: has a current medication list which includes the following prescription(s): acalabrutinib, accu-chek aviva plus test strp, accu-chek guide me glucose mtr, accu-chek softclix lancets, albuterol 2.5 mg /3 mL (0.083 %) Nebu 3 mL, albuterol 5 mg/mL Nebu 0.5 mL, albuterol, amlodipine, amlodipine-atorvastatin, atorvastatin, atorvastatin, bd alcohol swabs, bd ultra-fine short pen needle, chlorhexidine, cholecalciferol (vitamin d3-125 mcg (5,000 unit)), clopidogrel, co-enzyme q-10, co q-10, cyanocobalamin (vitamin b-12), doxazosin, doxycycline, ergocalciferol-1,250 mcg (50,000 unit), ergocalciferol-1,250 mcg (50,000 unit), ferrous sulfate, furosemide, furosemide, hydralazine, hydralazine, ipratropium-albuterol, jentadueto, victoza 2-pak, liraglutide, losartan, losartan, metformin, metoprolol su-hydrochlorothiaz, metoprolol succinate, spironolactone, spironolactone, true metrix level 2, victoza 3-pak, bioflex, zafirlukast, dexamethasone, mometasone-formoterol, sitagliptin-metformin, and sucralfate, and the following Facility-Administered Medications: ferric carboxymaltose 750 mg in sodium chloride (NS) 0.9 % 250 mL IVPB.  FAMILY HISTORY / SOCIAL HISTORY:  Reviewed and updated as appropriate.    REVIEW OF SYSTEMS:  As per interval history.  All other systems reviewed and negative.    PHYSICAL EXAM    Vitals: BP 148/66  - Pulse 64  - Temp 36.7 ??C (98 ??F) (Temporal)  - Resp 18  - Ht 165.1 cm (5' 5)  - Wt 71.2 kg (157 lb)  - SpO2 99%  - BMI 26.13 kg/m??   General:  Pleasant, no acute distress.    Pain:  Pain Evaluation:                                   Pain Score (0 - 1):                                Pain Location:                                       Eyes:  Appears normal.   HENT:  Atraumatic.   Neck:  thyroid midline.  Lymphatics: Bilateral small cervical lymph nodes.  Supraclavicular axillary or inguinal lymphadenopathy noted   Cardiovascular:  No edema RRR  Respiratory:  Unlabored.  Good air entry b/l. No wheezing.  Mild crackles at the left base  Gastrointestinal: Does not appear distended.  Nontender. No palpable HSM.   Skin:  No rashes or subcutaneous nodules.    Musculoskeletal:  Gait is steady     Psychiatric:  Affect appropriate. Judgment and insight nl.  Neurologic:  Alert and oriented x3. Grossly non-focal.              RECENT LABS/PATHOLOGY:  Results for orders placed or performed in visit on 05/21/21   Iron Level and TIBC   Result Value Ref Range    Iron 36 (L) 50 - 150 ug/dL    TIBC 914 782 - 956 ug/dL    Iron Saturation (%) 13 (L) 20 - 55 %   Ferritin   Result Value Ref Range    Ferritin 124.9 10.5 - 307.3 ng/mL   Comprehensive Metabolic Panel   Result Value Ref Range    Sodium 126 (L) 135 - 145 mmol/L    Potassium 4.9 3.5 - 5.0 mmol/L    Chloride 94 (L) 98 - 107 mmol/L    CO2 22.8 21.0 - 32.0 mmol/L    Anion Gap 9 3 - 11 mmol/L    BUN 38 (H) 8 - 20 mg/dL    Creatinine 2.13 (H) 0.80 - 1.30 mg/dL    BUN/Creatinine Ratio 28     eGFR CKD-EPI (2021) Male 52 (L) >=60 mL/min/1.41m2    Glucose 132 70 - 179 mg/dL    Calcium 8.8 8.5 - 08.6 mg/dL    Albumin 3.3 (L) 3.5 - 5.0 g/dL    Total Protein 5.6 (L) 6.0 - 8.0 g/dL    Total Bilirubin 0.4 0.2 - 1.0 mg/dL    AST 15 15 - 40 U/L    ALT 24 12 - 78 U/L    Alkaline Phosphatase 135 (H) 46 - 116 U/L   IgG   Result Value Ref Range    Total IgG 186 (L) 646 - 2,013 mg/dL   CBC w/ Differential   Result Value Ref Range  WBC 11.2 (H) 3.5 - 10.5 10*9/L    RBC 3.23 (L) 4.32 - 5.72 10*12/L    HGB 9.1 (L) 13.5 - 17.5 g/dL    HCT 29.5 (L) 62.1 - 50.0 %    MCV 84.8 81.0 - 95.0 fL    MCH 28.2 26.0 - 34.0 pg    MCHC 33.3 30.0 - 36.0 g/dL    RDW 30.8 65.7 - 84.6 %    MPV 7.7 7.0 - 10.0 fL    Platelet 120 (L) 150 - 450 10*9/L    Neutrophils % 28.6 %    Lymphocytes % 65.0 %    Monocytes % 5.4 %    Eosinophils % 0.7 %    Basophils % 0.3 %    Absolute Neutrophils 3.2 1.7 - 7.7 10*9/L    Absolute Lymphocytes 7.3 (H) 0.7 - 4.0 10*9/L    Absolute Monocytes 0.6 0.1 - 1.0 10*9/L    Absolute Eosinophils 0.1 0.0 - 0.7 10*9/L    Absolute Basophils 0.0 0.0 - 0.1 10*9/L     RADIOLOGY    11/14/2020  PET CT   Patchy consolidative opacities within the lungs, increased from chest CT 10/16/2020, particularly in the lingula with areas FDG avid focus.   This is likely infectious/inflammatory.   Recommend follow-up chest CT in 3 months.  Multiple enlarged lymph nodes in the neck, chest, abdomen and pelvis without significant FDG avidity.   Splenomegaly, without significant FDG avidity      02/13/21 CT chest:  1.  Mixed response of pulmonary infiltrates to interval therapy as detailed above.  The hypermetabolic apicoposterior infiltrate seen previously in the RIGHT upper lobe has largely resolved as have the nodular infiltrates seen previously in the inferior lingula while hypermetabolic infiltrate seen previously in the perihilar and peripheral RIGHT lower lobe is worse.  Follow-up CT the chest is recommended in 3 months.     2.  Interval improvement in mediastinal, bilateral hilar, gastrohepatic ligament, and splenic hilar adenopathy.  The spleen is also slightly smaller.     3.  Stable probably benign 1.7 cm LEFT adrenal mass.  Attention to this on follow-up imaging is indicated.     4.  Moderate to advanced atherosclerosis without aneurysm or dissection.  Multivessel coronary artery disease.  Chronic occlusion of the celiac artery.      05/14/21 CT Chest   1.  Fluctuating pulmonary opacities compared to the prior exams.  Compared to June 2022, these are improved in the perihilar right upper and right lower lobe as well as the dependent left lung base.  They are mildly worse in the right middle lobe, anterolateral right lower lobe,, lingula, and left lower lobe superior segment.  2.  0.4 cm right upper lobe nodule is new compared to June 2022.  Additional discrete small lung nodules are unchanged since February 2022.  Continued attention on follow-up.  3.  Mediastinal, hilar, and gastrohepatic lymphadenopathy is stable to minimally decreased compared to June 2022.  Spleen size is stable.    ASSESSMENT and PLAN:    # Chronic lymphocytic leukemia  --Rai stage IV CLL  --CLL FISH panel shows 13 q. deletion 21.5%, IGHV mutated -good prognostic features  --11/05/2020: T p53 mutation negative.  Notch 1 -.  SF 3 B1 positive.  SF 3 B1 positivity indicates poor prognosis.  -- 07/2019 DAT, haptoglobin, LDH normal-no hemolysis  --Indication for treatment advanced stage and history of thrombocytopenia .  -- He has been treated with Rituxan many years  ago by Dr Thedore Mins, with good response in the 1990s. Treatment ndication at that time may have been frequent infections.  --I reviewed his recent PET scan.  The mediastinal hilar intra-abdominal lymph nodes are without significant avidity thereby unlikely to have Richter's transformation.  Some of the hilar mediastinal adenopathy may be also reactive to his recent COVID-19 pneumonia.  The patchy opacities within the lung appear to be slightly worse compared to 10/16/2020  -- repeat CT chest 02/13/21 noted above. Overall improvement in infiltrates except for LLL. improvemen tin LAD may be c/w response to Acalbrutinib vs improvement in reactive ALD from prior COVID.   -- started acabrutinib 12/19/20  -- hb stable, platelet improved and ALC declining appropriately   -- patient reports ongoing and gradually worsening fatigue.     PLAN:  -- continue Acalbrutinib 100 mg BID.   -- RTC in 1 mo as scheduled for IVIG infusion and MD visit.     # patchy lung opacities  -- most recent CT chest from last wek showed a mixed response with regardin the GGOs. He has no infectious or repsiratory symptoms. Suspect these may be expected changes seen after Covid   -- he is scheduled to meet with her pulmonologist Dr Ace Gins in a few weeks.   -- I have ordered a follow up Ct chest in 3 months.         #  Iron deficiency anemia-resolved.   -- He was noted to have iron deficiency in 2014 and has since received IV iron in the past and has been on oral iron 1 pill/day.   -- iron deficiency anemia/ACD in 07/2019 with hb down to 8.7 and ferritin 78, iron sat 12%  -- now s/o 1020 mg ferheme 07/2019 and early 10/2018 with hb improvement to 10.9 today.   --EGD and colonoscopy with Dr. Loreta Ave 01/2020 revealed bleeding gastric ulcer, smaller antral ulcer with old blood. .  He has been placed on a PPI by Dr. Loreta Ave.    #. Hypogammaglobulinemia   --History level continues to be low however he has not had any more infections since we started him on IVIG. Before we started him on IVIG he has had multiple hospitalizations for pneumonias.  --Therefore we will continue IVIG for now every 3 months as he is tolerated this well.  --received IVIG  01/2021  - next dose in 04/2021 in 1 month       # Health maintenance:  -- Evusheld  Received 12/04/20     Elyse Hsu, MD  [facility]      RTC in 1 month    CC:    [place], Elpidio Anis, MD

## 2021-05-28 ENCOUNTER — Ambulatory Visit: Admit: 2021-05-28 | Discharge: 2021-05-29 | Payer: MEDICARE

## 2021-05-28 DIAGNOSIS — D509 Iron deficiency anemia, unspecified: Principal | ICD-10-CM

## 2021-05-28 DIAGNOSIS — D801 Nonfamilial hypogammaglobulinemia: Principal | ICD-10-CM

## 2021-05-28 DIAGNOSIS — C9110 Chronic lymphocytic leukemia of B-cell type not having achieved remission: Principal | ICD-10-CM

## 2021-05-28 MED ADMIN — immun glob G(IgG)-pro-IgA 0-50 (PRIVIGEN) 10 % intravenous solution 25 g: .4 g/kg | INTRAVENOUS | @ 14:00:00 | Stop: 2021-05-28

## 2021-05-28 MED ADMIN — sodium chloride (NS) 0.9 % infusion: 20 mL/h | INTRAVENOUS | @ 13:00:00 | Stop: 2021-05-28

## 2021-05-28 MED ADMIN — diphenhydrAMINE (BENADRYL) capsule/tablet 25 mg: 25 mg | ORAL | @ 13:00:00 | Stop: 2021-05-28

## 2021-05-28 MED ADMIN — acetaminophen (TYLENOL) tablet 650 mg: 650 mg | ORAL | @ 13:00:00 | Stop: 2021-05-28

## 2021-05-28 NOTE — Unmapped (Signed)
Infusion completed, tolerated well, VSS.  Patient provided blankets, drinks, and snacks as needed throughout infusion.  Comfort continually assessed by staff.  PIV de-accessed per [place] protocol. Patient discharged in stable condition, ambulatory with cane.  Dr. Jomarie Longs acknowledged for patient that it is appropriate to receive covid booster along with a flu vaccine. Return to clinic information provided.

## 2021-06-03 NOTE — Unmapped (Signed)
[facility] Clinical Assessment & Refill Coordination Note    Christian Abbott, DOB: 1935-04-07  Phone: There are no phone numbers on file.    All above HIPAA information was verified with patient.     Was a Nurse, learning disability used for this call? No    Specialty Medication(s):   Hematology/Oncology: Calquence     Current Outpatient Medications   Medication Sig Dispense Refill   ??? acalabrutinib (CALQUENCE) 100 mg capsule Take 1 capsule (100 mg total) by mouth Two (2) times a day. Do not break, open, or chew capsules. 60 capsule 5   ??? ACCU-CHEK AVIVA PLUS TEST STRP Strp      ??? ACCU-CHEK GUIDE ME GLUCOSE MTR Misc      ??? ACCU-CHEK SOFTCLIX LANCETS lancets      ??? albuterol 2.5 mg /3 mL (0.083 %) Nebu 3 mL, albuterol 5 mg/mL Nebu 0.5 mL Inhale 2.5 mg.     ??? albuterol 2.5 mg/0.5 mL nebulizer solution Inhale 2.5 mg.     ??? amLODIPine (NORVASC) 10 MG tablet Take 10 mg by mouth daily.      ??? amLODIPine-atorvastatin (CADUET) 10-10 mg per tablet      ??? atorvastatin (LIPITOR) 80 MG tablet Take 80 mg by mouth.     ??? atorvastatin (LIPITOR) 80 MG tablet      ??? BD ALCOHOL SWABS PadM      ??? BD ULTRA-FINE SHORT PEN NEEDLE 31 gauge x 5/16 (8 mm) Ndle AS DIRECTED ONCE DAILY 100 DAYS     ??? chlorhexidine (PERIDEX) 0.12 % solution RINSE 15 ML S ONCE DAILY AFTER BREAKFAST FOLLOWING BRUSHING AND FLOSSING     ??? cholecalciferol, vitamin D3, 5,000 unit Tab Take 1 tablet by mouth.     ??? clopidogrel (PLAVIX) 75 mg tablet Take 75 mg by mouth.     ??? co-enzyme Q-10 30 mg capsule Take 30 mg by mouth Three (3) times a day.     ??? coenzyme Q10 (CO Q-10) 300 mg cap      ??? cyanocobalamin, vitamin B-12, 1,000 mcg TbER Take 1 tablet by mouth.     ??? dexAMETHasone (DECADRON) 6 MG tablet TAKE 1 TABLET (6 MG TOTAL) BY MOUTH DAILY FOR 7 DAYS. (Patient not taking: Reported on 05/21/2021)     ??? doxazosin (CARDURA) 4 MG tablet Take 4 mg by mouth nightly.  1   ??? doxycycline (VIBRAMYCIN) 100 MG capsule      ??? ergocalciferol-1,250 mcg, 50,000 unit, (DRISDOL) 1,250 mcg (50,000 unit) capsule TAKE 1 CAPSULE BY MOUTH ONE TIME PER WEEK     ??? ergocalciferol-1,250 mcg, 50,000 unit, (DRISDOL) 1,250 mcg (50,000 unit) capsule Take 1 capsule by mouth once a week.     ??? ferrous sulfate 15 mg iron/1.5 mL Susp Take 1 tablet by mouth.     ??? furosemide (LASIX) 20 MG tablet Take 40 mg by mouth.     ??? furosemide (LASIX) 40 MG tablet Monday & Wednesday     ??? hydrALAZINE (APRESOLINE) 50 MG tablet Take 50 mg by mouth Three (3) times a day.      ??? hydrALAZINE (APRESOLINE) 50 MG tablet      ??? ipratropium-albuterol (DUO-NEB) 0.5-2.5 mg/3 mL nebulizer GIVE 3 ML TWICE DAILY INHALATION 90 DAYS  3   ??? JENTADUETO 2.5-500 mg Tab      ??? liraglutide (VICTOZA 2-PAK) 0.6 mg/0.1 mL (18 mg/3 mL) injection      ??? LIRAGLUTIDE SUBQ Inject 1.8 mg under the  skin.     ??? losartan (COZAAR) 50 MG tablet Take 50 mg by mouth daily.  0   ??? losartan (COZAAR) 50 MG tablet      ??? metFORMIN (GLUCOPHAGE-XR) 500 MG 24 hr tablet      ??? metoprolol su-hydrochlorothiaz 50-12.5 mg Tb24      ??? metoprolol succinate (TOPROL-XL) 50 MG 24 hr tablet Take 50 mg by mouth.     ??? mometasone-formoterol 200-5 mcg/actuation HFAA Inhale 2 puffs two (2) times a day. (Patient not taking: Reported on 05/21/2021)     ??? sitaGLIPtin-metFORMIN (JANUMET) 50-1,000 mg per tablet Take 2 tablets by mouth daily at 0600.  (Patient not taking: Reported on 05/21/2021)     ??? spironolactone (ALDACTONE) 25 MG tablet Take 1 tablet by mouth every other day.     ??? spironolactone (ALDACTONE) 25 MG tablet      ??? sucralfate (CARAFATE) 100 mg/mL suspension Take 10 mL (1 g total) by mouth Two (2) times a day. Take 3 hours after taking Acalbrutinib 420 mL 5   ??? TRUE METRIX LEVEL 2 Soln      ??? VICTOZA 3-PAK 0.6 mg/0.1 mL (18 mg/3 mL) injection 1.8 mg.  2   ??? vit C-bioflav-hesp-rutin-hb196 (BIOFLEX) 500-50-25-40 mg Tab Take by mouth.     ??? zafirlukast (ACCOLATE) 20 MG tablet Take 20 mg by mouth.       No current facility-administered medications for this visit.     Facility-Administered Medications Ordered in Other Visits   Medication Dose Route Frequency Provider Last Rate Last Admin   ??? ferric carboxymaltose 750 mg in sodium chloride (NS) 0.9 % 250 mL IVPB  750 mg Intravenous Once Domingo Dimes, MD            Changes to medications: [name] reports no changes at this time.    No Known Allergies    Changes to allergies: No    SPECIALTY MEDICATION ADHERENCE     Calquence 100 mg: 14 days of medicine on hand     Medication Adherence    Patient reported X missed doses in the last month: 0  Specialty Medication: Calquence 100mg           Specialty medication(s) dose(s) confirmed: Regimen is correct and unchanged.     Are there any concerns with adherence? No    Adherence counseling provided? Not needed    CLINICAL MANAGEMENT AND INTERVENTION      Clinical Benefit Assessment:    Do you feel the medicine is effective or helping your condition? Yes    Clinical Benefit counseling provided? Not needed    Adverse Effects Assessment:    Are you experiencing any side effects? No    Are you experiencing difficulty administering your medicine? No    Quality of Life Assessment:    Quality of Life      Oncology  1. What impact has your specialty medication had on the reduction of your daily pain or discomfort level?: None  2. On a scale of 1-10, how would you rate your ability to manage side effects associated with your specialty medication? (1=no issues, 10 = unable to take medication due to side effects): 1            How many days over the past month did your condition/medication  keep you from your normal activities? For example, brushing your teeth or getting up in the morning. 0    Have you discussed this with your provider? Not needed  Acute Infection Status:    Acute infections noted within Epic:  No active infections  Patient reported infection: None    Therapy Appropriateness:    Is therapy appropriate and patient progressing towards therapeutic goals? Yes, therapy is appropriate and should be continued    DISEASE/MEDICATION-SPECIFIC INFORMATION      N/A    PATIENT SPECIFIC NEEDS     - Does the patient have any physical, cognitive, or cultural barriers? No    - Is the patient high risk? Yes, patient is taking oral chemotherapy. Appropriateness of therapy as been assessed    - Does the patient require a Care Management Plan? No     - Does the patient require physician intervention or other additional services (i.e. nutrition, smoking cessation, social work)? No      SHIPPING     Specialty Medication(s) to be Shipped:   Hematology/Oncology: Calquence    Other medication(s) to be shipped: No additional medications requested for fill at this time     Changes to insurance: No    Delivery Scheduled: Yes, Expected medication delivery date: 06/12/21.     Medication will be delivered via Next Day Courier to the confirmed prescription address in [facility].    The patient will receive a drug information handout for each medication shipped and additional FDA Medication Guides as required.  Verified that patient has previously received a Conservation officer, historic buildings and a Surveyor, mining.    The patient or caregiver noted above participated in the development of this care plan and knows that they can request review of or adjustments to the care plan at any time.      All of the patient's questions and concerns have been addressed.    Rollen Sox   [name] Specialty Pharmacist

## 2021-06-11 MED FILL — CALQUENCE 100 MG CAPSULE: ORAL | 30 days supply | Qty: 60 | Fill #3

## 2021-06-19 DIAGNOSIS — D649 Anemia, unspecified: Principal | ICD-10-CM

## 2021-06-19 DIAGNOSIS — C9110 Chronic lymphocytic leukemia of B-cell type not having achieved remission: Principal | ICD-10-CM

## 2021-06-22 ENCOUNTER — Ambulatory Visit: Admit: 2021-06-22 | Discharge: 2021-06-23 | Payer: MEDICARE

## 2021-06-22 DIAGNOSIS — D649 Anemia, unspecified: Principal | ICD-10-CM

## 2021-06-22 DIAGNOSIS — C9110 Chronic lymphocytic leukemia of B-cell type not having achieved remission: Principal | ICD-10-CM

## 2021-06-22 DIAGNOSIS — Z298 Encounter for other specified prophylactic measures: Principal | ICD-10-CM

## 2021-06-22 DIAGNOSIS — D801 Nonfamilial hypogammaglobulinemia: Principal | ICD-10-CM

## 2021-06-22 LAB — COMPREHENSIVE METABOLIC PANEL
ALBUMIN: 3.4 g/dL — ABNORMAL LOW (ref 3.5–5.0)
ALKALINE PHOSPHATASE: 135 U/L — ABNORMAL HIGH (ref 46–116)
ALT (SGPT): 17 U/L (ref 12–78)
ANION GAP: 6 mmol/L (ref 3–11)
AST (SGOT): 14 U/L — ABNORMAL LOW (ref 15–40)
BILIRUBIN TOTAL: 0.2 mg/dL (ref 0.2–1.0)
BLOOD UREA NITROGEN: 50 mg/dL — ABNORMAL HIGH (ref 8–20)
BUN / CREAT RATIO: 34
CALCIUM: 8.8 mg/dL (ref 8.5–10.1)
CHLORIDE: 100 mmol/L (ref 98–107)
CO2: 26.9 mmol/L (ref 21.0–32.0)
CREATININE: 1.49 mg/dL — ABNORMAL HIGH (ref 0.80–1.30)
EGFR CKD-EPI (2021) MALE: 46 mL/min/{1.73_m2} — ABNORMAL LOW (ref >=60)
GLUCOSE RANDOM: 169 mg/dL (ref 70–179)
POTASSIUM: 4.2 mmol/L (ref 3.5–5.0)
PROTEIN TOTAL: 5.9 g/dL — ABNORMAL LOW (ref 6.0–8.0)
SODIUM: 133 mmol/L — ABNORMAL LOW (ref 135–145)

## 2021-06-22 LAB — IRON & TIBC
IRON SATURATION: 17 % — ABNORMAL LOW (ref 20–55)
IRON: 46 ug/dL — ABNORMAL LOW (ref 50–150)
TOTAL IRON BINDING CAPACITY: 270 ug/dL (ref 250–450)

## 2021-06-22 LAB — CBC W/ AUTO DIFF
BASOPHILS ABSOLUTE COUNT: 0 10*9/L (ref 0.0–0.1)
BASOPHILS RELATIVE PERCENT: 0.4 %
EOSINOPHILS ABSOLUTE COUNT: 0.1 10*9/L (ref 0.0–0.7)
EOSINOPHILS RELATIVE PERCENT: 0.6 %
HEMATOCRIT: 32.7 % — ABNORMAL LOW (ref 38.0–50.0)
HEMOGLOBIN: 10.7 g/dL — ABNORMAL LOW (ref 13.5–17.5)
LYMPHOCYTES ABSOLUTE COUNT: 6.7 10*9/L — ABNORMAL HIGH (ref 0.7–4.0)
LYMPHOCYTES RELATIVE PERCENT: 64.4 %
MEAN CORPUSCULAR HEMOGLOBIN CONC: 32.6 g/dL (ref 30.0–36.0)
MEAN CORPUSCULAR HEMOGLOBIN: 27.7 pg (ref 26.0–34.0)
MEAN CORPUSCULAR VOLUME: 85 fL (ref 81.0–95.0)
MEAN PLATELET VOLUME: 7.6 fL (ref 7.0–10.0)
MONOCYTES ABSOLUTE COUNT: 0.6 10*9/L (ref 0.1–1.0)
MONOCYTES RELATIVE PERCENT: 5.5 %
NEUTROPHILS ABSOLUTE COUNT: 3 10*9/L (ref 1.7–7.7)
NEUTROPHILS RELATIVE PERCENT: 29.1 %
PLATELET COUNT: 122 10*9/L — ABNORMAL LOW (ref 150–450)
RED BLOOD CELL COUNT: 3.85 10*12/L — ABNORMAL LOW (ref 4.32–5.72)
RED CELL DISTRIBUTION WIDTH: 13.7 % (ref 12.0–15.0)
WBC ADJUSTED: 10.3 10*9/L (ref 3.5–10.5)

## 2021-06-22 NOTE — Unmapped (Signed)
HEMATOLOGY ONCOLOGY CLINIC:  INTERVAL FOLLOW UP  NOTE    REFERRING PHYSICIAN:  Sidhu, Clinton Quant,*  PCP:  Mliss Fritz, MD    DATE OF ENCOUNTER:  06/22/2021    INTERVAL HISTORY:  Christian Abbott is an 86 y.o. male who comes today for continued follow-up of chronic lymphocytic leukemia.  Patient was admitted at [facility] for COVID-19 pneumonia from 10/06/2020-10/10/2020.  This was after he presented with fevers and shortness of breath he was treated with Decadron and remdesivir and a CT of his chest was consistent with multifocal pneumonia and showed significant mediastinal adenopathy.      Repeat CT chest without IV contrast on 10/16/2020 performed by Dr. Ace Gins revealed improving COVID-19 pneumonia and prominent mediastinal and hilar nodes which appear to be decreased from the prior exam and on 10/06/2020.      Given his advanced disease he was started on acalbrutinib 12/19/20.       On 02/13/21 he had a follow up CT chest with contrast which noted a mixed response in the pulmonary infiltrates with improvemen tin the RUL and left sided infiltrates but worsening intermittently the LLL infiltrates. However the adenopathy in the chest and a abdomen have improved.     Patient underwent a CT chest on 05/14/2021 noted below.     Patient was seen approximately 1 month ago on 05/21/2021.  He followed up with his cardiologist due to increasing shortness of breath in early October 2022.  On 06/02/2021 chest x-ray showed similar enlargement of the cardio pericardial silhouette and prominence of the central pulmonary vasculature which was suggestive of possible pulmonary vascular congestion.  A proBNP level at that time was elevated 658.  His Lasix dose was increased to 40 mg p.o. daily.  06/20/2019 2 repeat labs showed a slightly increased creatinine of 1.56.  BNP had come down to 271.  Clinically he was feeling better and reported improvement in his shortness of breath after diuresing with 40 mg Lasix daily.  Since then he has been alternating doses of 40 mg and 20 mg of Lasix daily with Lasix given 20 mg on Monday Wednesday Friday and 40 mg on Tuesday Thursday Saturday Sunday.  Daughter states that he had a cardiac stress test done recently.  Results are pending.  He had a call out to his cardiologist to discuss how to manage his Lasix currently.      ONCOLOGY HISTORY  Oncology History Overview Note   86 y/o male with long-standing history of chronic lymphocytic leukemia initially diagnosed in 1998  Status post Rituxan based therapy for 8 weeks during that time  History of anemia treated with Procrit off and on  History of hypertension diabetes asthma hypercholesterolemia.  History of hospitalization in January 2014 for bilateral pneumonia [facility] for 3 weeks.     CLL (chronic lymphocytic leukemia) (CMS-HCC)   07/18/1997 Initial Diagnosis    CLL (chronic lymphocytic leukemia)           PAST MEDICAL HISTORY:     Patient Active Problem List   Diagnosis   ??? CLL (chronic lymphocytic leukemia) (CMS-HCC)   ??? Iron deficiency anemia   ??? Hypogammaglobulinemia (CMS-HCC)   ??? Anemia   ??? Need for prophylactic immunotherapy      has no past surgical history on file.    ALLERGIES:  has No Known Allergies.      MEDICATIONS: has a current medication list which includes the following prescription(s): acalabrutinib, accu-chek aviva plus test strp, accu-chek guide  me glucose mtr, accu-chek softclix lancets, albuterol 2.5 mg /3 mL (0.083 %) Nebu 3 mL, albuterol 5 mg/mL Nebu 0.5 mL, albuterol, amlodipine, amlodipine-atorvastatin, atorvastatin, atorvastatin, bd alcohol swabs, bd ultra-fine short pen needle, chlorhexidine, cholecalciferol (vitamin d3-125 mcg (5,000 unit)), clopidogrel, co-enzyme q-10, co q-10, cyanocobalamin (vitamin b-12), dexamethasone, doxazosin, doxycycline, ergocalciferol-1,250 mcg (50,000 unit), ergocalciferol-1,250 mcg (50,000 unit), famotidine, ferrous sulfate, furosemide, furosemide, glucosamine/chondr su a sod, hydralazine, hydralazine, ipratropium-albuterol, jentadueto, victoza 2-pak, liraglutide, losartan, losartan, metformin, metoprolol su-hydrochlorothiaz, metoprolol succinate, mometasone-formoterol, sitagliptin-metformin, spironolactone, spironolactone, sucralfate, true metrix level 2, victoza 3-pak, bioflex, and zafirlukast, and the following Facility-Administered Medications: ferric carboxymaltose 750 mg in sodium chloride (NS) 0.9 % 250 mL IVPB.    FAMILY HISTORY / SOCIAL HISTORY:  Reviewed and updated as appropriate.    REVIEW OF SYSTEMS:  As per interval history.  All other systems reviewed and negative.    PHYSICAL EXAM    Vitals: BP 148/65  - Pulse 56  - Temp 37 ??C (98.6 ??F) (Temporal)  - Resp 16  - Ht 165.1 cm (5' 5)  - Wt 67.9 kg (149 lb 12.8 oz)  - SpO2 99%  - BMI 24.93 kg/m??   General:  Pleasant, no acute distress.    Pain:  Pain Evaluation:                                   Pain Score (0 - 1):                                Pain Location:                                       Eyes:  Appears normal.   HENT:  Atraumatic.   Neck:  thyroid midline.  Lymphatics: Bilateral small cervical lymph nodes.  No supraclavicular axillary or inguinal lymphadenopathy noted   Cardiovascular:  No edema RRR  Respiratory:  Unlabored.  Good air entry b/l. No wheezing.  Mild crackles bilateral bases.  Gastrointestinal: Does not appear distended.  Nontender. No palpable HSM.   Skin:  No rashes or subcutaneous nodules.    Musculoskeletal:  Gait is steady     Psychiatric:  Affect appropriate. Judgment and insight nl.  Neurologic:  Alert and oriented x3. Grossly non-focal.              RECENT LABS/PATHOLOGY:  Results for orders placed or performed in visit on 06/22/21   Iron & TIBC   Result Value Ref Range    Iron 46 (L) 50 - 150 ug/dL    TIBC 161 096 - 045 ug/dL    Iron Saturation (%) 17 (L) 20 - 55 %   Comprehensive Metabolic Panel   Result Value Ref Range    Sodium 133 (L) 135 - 145 mmol/L    Potassium 4.2 3.5 - 5.0 mmol/L Chloride 100 98 - 107 mmol/L    CO2 26.9 21.0 - 32.0 mmol/L    Anion Gap 6 3 - 11 mmol/L    BUN 50 (H) 8 - 20 mg/dL    Creatinine 4.09 (H) 0.80 - 1.30 mg/dL    BUN/Creatinine Ratio 34     eGFR CKD-EPI (2021) Male 46 (L) >=60 mL/min/1.13m2    Glucose 169 70 - 179 mg/dL  Calcium 8.8 8.5 - 10.1 mg/dL    Albumin 3.4 (L) 3.5 - 5.0 g/dL    Total Protein 5.9 (L) 6.0 - 8.0 g/dL    Total Bilirubin 0.2 0.2 - 1.0 mg/dL    AST 14 (L) 15 - 40 U/L    ALT 17 12 - 78 U/L    Alkaline Phosphatase 135 (H) 46 - 116 U/L   CBC w/ Differential   Result Value Ref Range    WBC 10.3 3.5 - 10.5 10*9/L    RBC 3.85 (L) 4.32 - 5.72 10*12/L    HGB 10.7 (L) 13.5 - 17.5 g/dL    HCT 16.1 (L) 09.6 - 50.0 %    MCV 85.0 81.0 - 95.0 fL    MCH 27.7 26.0 - 34.0 pg    MCHC 32.6 30.0 - 36.0 g/dL    RDW 04.5 40.9 - 81.1 %    MPV 7.6 7.0 - 10.0 fL    Platelet 122 (L) 150 - 450 10*9/L    Neutrophils % 29.1 %    Lymphocytes % 64.4 %    Monocytes % 5.5 %    Eosinophils % 0.6 %    Basophils % 0.4 %    Absolute Neutrophils 3.0 1.7 - 7.7 10*9/L    Absolute Lymphocytes 6.7 (H) 0.7 - 4.0 10*9/L    Absolute Monocytes 0.6 0.1 - 1.0 10*9/L    Absolute Eosinophils 0.1 0.0 - 0.7 10*9/L    Absolute Basophils 0.0 0.0 - 0.1 10*9/L     RADIOLOGY    11/14/2020  PET CT   Patchy consolidative opacities within the lungs, increased from chest CT 10/16/2020, particularly in the lingula with areas FDG avid focus.   This is likely infectious/inflammatory.   Recommend follow-up chest CT in 3 months.  Multiple enlarged lymph nodes in the neck, chest, abdomen and pelvis without significant FDG avidity.   Splenomegaly, without significant FDG avidity      02/13/21 CT chest:  1.  Mixed response of pulmonary infiltrates to interval therapy as detailed above.  The hypermetabolic apicoposterior infiltrate seen previously in the RIGHT upper lobe has largely resolved as have the nodular infiltrates seen previously in the inferior lingula while hypermetabolic infiltrate seen previously in the perihilar and peripheral RIGHT lower lobe is worse.  Follow-up CT the chest is recommended in 3 months.     2.  Interval improvement in mediastinal, bilateral hilar, gastrohepatic ligament, and splenic hilar adenopathy.  The spleen is also slightly smaller.     3.  Stable probably benign 1.7 cm LEFT adrenal mass.  Attention to this on follow-up imaging is indicated.     4.  Moderate to advanced atherosclerosis without aneurysm or dissection.  Multivessel coronary artery disease.  Chronic occlusion of the celiac artery.      05/14/21 CT Chest   1.  Fluctuating pulmonary opacities compared to the prior exams.  Compared to June 2022, these are improved in the perihilar right upper and right lower lobe as well as the dependent left lung base.  They are mildly worse in the right middle lobe, anterolateral right lower lobe,, lingula, and left lower lobe superior segment.  2.  0.4 cm right upper lobe nodule is new compared to June 2022.  Additional discrete small lung nodules are unchanged since February 2022.  Continued attention on follow-up.  3.  Mediastinal, hilar, and gastrohepatic lymphadenopathy is stable to minimally decreased compared to June 2022.  Spleen size is stable.    ASSESSMENT  and PLAN:    # Chronic lymphocytic leukemia  --Rai stage IV CLL  --CLL FISH panel shows 13 q. deletion 21.5%, IGHV mutated -good prognostic features  --11/05/2020: T p53 mutation negative.  Notch 1 -.  SF 3 B1 positive.  SF 3 B1 positivity indicates poor prognosis.  -- 07/2019 DAT, haptoglobin, LDH normal-no hemolysis  --Indication for treatment advanced stage and history of thrombocytopenia .  -- He has been treated with Rituxan many years ago by Dr Thedore Mins, with good response in the 1990s. Treatment ndication at that time may have been frequent infections.  --I reviewed his recent PET scan.  The mediastinal hilar intra-abdominal lymph nodes are without significant avidity thereby unlikely to have Richter's transformation.  Some of the hilar mediastinal adenopathy may be also reactive to his recent COVID-19 pneumonia.  The patchy opacities within the lung appear to be slightly worse compared to 10/16/2020  -- repeat CT chest 02/13/21 noted above. Overall improvement in infiltrates except for LLL. improvemen tin LAD may be c/w response to Acalbrutinib vs improvement in reactive ALD from prior COVID.   -- started acabrutinib 12/19/20  -- hb improved slightly to 10.7.  This may be partially related to hemoconcentration from diuresis, platelet stable at 122 transfer ALC continues to improve appropriately  --  chronic fatigue appears to be stable transfer text    PLAN:  -- continue Acalbrutinib 100 mg BID.       # patchy lung opacities  -- most recent CT chest from 05/16/2021 mixed response with regardin the GGOs.  He continues to have no infectious symptoms.  Patient is we would expect after his COVID diagnosis.  He followed up with Dr. Virgia Land recently.  Findings on his CT chest are felt to represent volume overload as noted by a recent elevation in proBNP on 06/02/2021.  No pulmonary intervention was recommended by Dr. Virgia Land at this time.  -- I have ordered a follow up Ct chest in 3 months to ensure no progression of groundglass opacities.  CT chest without contrast does not show progressive groundglass opacities will need further CT chest surveillance.         #  Iron deficiency anemia-resolved.   -- He was noted to have iron deficiency in 2014 and has since received IV iron in the past and has been on oral iron 1 pill/day.   -- iron deficiency anemia/ACD in 07/2019 with hb down to 8.7 and ferritin 78, iron sat 12%  -- now s/o 1020 mg ferheme 07/2019 and early 10/2018 with hb improvement to 10.9 today.   --EGD and colonoscopy with Dr. Loreta Ave 01/2020 revealed bleeding gastric ulcer, smaller antral ulcer with old blood. .  Currently being managed with sucralfate.    #. Hypogammaglobulinemia   --History level continues to be low however he has not had any more infections since we started him on IVIG. Before we started him on IVIG he has had multiple hospitalizations for pneumonias.  --Therefore we will continue IVIG for now every 3 months as he is tolerated this well.  --received IVIG  05/28/2021  - next dose in 08/27/2021 in ~  2 month. He has asked if this appointment can be moved to mid Jan since his daughter will be out of town until then. We will plan to schedule this with a follow up with me in mid Jan.       # Health maintenance:  --  Evusheld  Received 12/04/20   -- bivalent booster  06/01/21  --  flu shot 06/01/21  -- He is overdue for his q6b month evusheld  Dose - ordered and requested appointment       # diastolic CHF with volume overload  -- followed closely by Cardiology. Currently on lasix.    -- he has many questions about dosing of lasix and monitoring   -- I have asked him and daughter to address these questions with his cardiologist.         DISPO:  RTC asap for next dose of Evusheld.   RTC in 1 month for MD visit and labs.   RTC in [facility] Jan 2023 for MD visit labs and IVIG.     Elyse Hsu, MD  [facility]        CC:    [place], [place] Caleb Popp, MD

## 2021-06-23 LAB — FERRITIN: FERRITIN: 122.5 ng/mL (ref 10.5–307.3)

## 2021-06-24 LAB — VITAMIN B12: VITAMIN B-12: 2825 pg/mL — ABNORMAL HIGH (ref 211–911)

## 2021-06-24 LAB — FOLATE: FOLATE: >48 ng/mL

## 2021-06-29 DIAGNOSIS — C9110 Chronic lymphocytic leukemia of B-cell type not having achieved remission: Principal | ICD-10-CM

## 2021-06-29 MED ORDER — ACALABRUTINIB MALEATE 100 MG TABLET
ORAL_TABLET | Freq: Two times a day (BID) | ORAL | 5 refills | 30 days | Status: CP
Start: 2021-06-29 — End: ?
  Filled 2021-08-13: qty 60, 30d supply, fill #0

## 2021-06-30 DIAGNOSIS — Z298 Encounter for other specified prophylactic measures: Principal | ICD-10-CM

## 2021-06-30 DIAGNOSIS — C9110 Chronic lymphocytic leukemia of B-cell type not having achieved remission: Principal | ICD-10-CM

## 2021-06-30 DIAGNOSIS — D801 Nonfamilial hypogammaglobulinemia: Principal | ICD-10-CM

## 2021-07-01 ENCOUNTER — Ambulatory Visit: Admit: 2021-07-01 | Discharge: 2021-07-02 | Payer: MEDICARE

## 2021-07-01 DIAGNOSIS — Z298 Encounter for other specified prophylactic measures: Principal | ICD-10-CM

## 2021-07-01 DIAGNOSIS — D801 Nonfamilial hypogammaglobulinemia: Principal | ICD-10-CM

## 2021-07-01 DIAGNOSIS — C9110 Chronic lymphocytic leukemia of B-cell type not having achieved remission: Principal | ICD-10-CM

## 2021-07-01 MED ADMIN — diphenhydrAMINE (BENADRYL) capsule/tablet 25 mg: 25 mg | ORAL | @ 19:00:00 | Stop: 2021-07-01

## 2021-07-01 MED ADMIN — tixagevimab-cilgavimab 300 mg/3 mL- 300 mg/3 mL injection 6 mL: 6 mL | INTRAMUSCULAR | @ 19:00:00 | Stop: 2021-07-01

## 2021-07-01 MED ADMIN — acetaminophen (TYLENOL) tablet 650 mg: 650 mg | ORAL | @ 19:00:00 | Stop: 2021-07-01

## 2021-07-01 NOTE — Unmapped (Signed)
Patient arrived to the clinic.  Confirmed with Alaster Ferrelli and reports that they have not had any recent exposure to an individual infected with or having symptoms concerning for COVID-19 infection and reports that they have not experienced any symptoms concerning for COVID-19 infection.  Reviewed process for medication administration and post administration monitoring and they agree to proceed.  Provided with a copy Fact Sheet for Patients, Parents And Caregivers Emergency Use Authorization (EUA) of EVUSHELD??? (tixagevimab co-packaged with cilgavimab) for Coronavirus Disease 2019 (COVID-19) and the link has also been attached to the patient instructions in the AVS / on MyChart.    Pt received injections without issue or complaint.  Site free of bruising and discoloration. Pt observed for 1 hour post injections. Pt aware of upcoming appointments, ambulated with cane, with steady gait to lobby and was discharged on own recognizance    Christian Abbott  07/01/2021, 3:20 PM

## 2021-07-01 NOTE — Unmapped (Signed)
During your appointment today you received EVUSHELD, which is an investigational medicine used in certain high-risk adults and adolescents for pre-exposure prophylaxis that can help protect the you from coronavirus disease 2019 (COVID-19) before you have been exposed to the virus.      After your COVID Therapeutics with Evusheld, if you have any additional questions or experience minor symptoms (listed below) please contact your primary care provider or your specialty provider that discussed and referred you for this therapy. If feel that you need to be evaluated and your primary care or specialty provider are not able to see you, you can go to your local urgent care facility for evaluation and treatment.      For severe symptoms such as shortness of breath or chest pain - PLEASE DIAL 911.     Possible reactions have been observed after the administration of EVUSHELD.  Signs and symptoms of potential administration related reactions may include:   Fever, chills, nausea, headache, shortness of breath, low or high blood pressure, rapid or slow heart rate, chest discomfort or pain, weakness, feeling tired, swelling of your lips, face, or throat, rash including hives, itching, muscle aches, dizziness and sweating.      There are potential side effects from getting a medicine by intramuscular injection and may include pain, bruising of the skin, soreness, swelling, and possible bleeding or infection at the injection site.  Please not that it is common to have some mild discomfort or bruising at the injection site.  You may use a cold compress to help with the soreness and a heating pad can be helpful with the bruising.  The soreness and bruising may take a few days to go away.  If you notice any severe pain, redness, drainage, numbness or tingling around the injection site, please contact your primary care provider.    Contact your healthcare provider or get medical help right away if you get any severe symptoms of cardiac events, which may include pain, pressure, or discomfort in the chest, arms, neck, back, stomach or jaw, as well as shortness of breath, extreme weakness that is new, feeling sick (nausea), confusion, or new or worsening swelling in your ankles or lower legs.    How do you report side effects with EVUSHELD?   Tell your healthcare provider right away if you have any side effects that bother you or do not go away.     Or you may report side effects to FDA MedWatch at [employer] or call 1-800-FDA-1088.    During your appointment you were provided with a copy of:  Fact Sheet for Patients, Parents And Caregivers Emergency Use Authorization (EUA) of EVUSHELD??? (tixagevimab co-packaged with cilgavimab) for Coronavirus Disease 2019 (COVID-19) during your appointment which can also be accessed at the following link: https://dennis.info/    Thank you for choosing [facility].

## 2021-07-01 NOTE — Unmapped (Signed)
Clinical Assessment Needed For: Formulation Change  Medication: Calquence 100mg  tablets   Last Fill Date/Day Supply: 06-11-21 / 30  Copay $100  Was previous dose already scheduled to fill: No    Notes to Pharmacist:

## 2021-07-03 NOTE — Unmapped (Signed)
[facility] Refill Coordination Note    Specialty Medication(s) to be Shipped:   Hematology/Oncology: Calquence    Other medication(s) to be shipped: No additional medications requested for fill at this time     Christian Abbott, DOB: Sep 12, 1934  Phone: There are no phone numbers on file.      All above HIPAA information was verified with patient's daughter     Was a Nurse, learning disability used for this call? No    Completed refill call assessment today to schedule patient's medication shipment from the [facility] [phone]).  All relevant notes have been reviewed.     Specialty medication(s) and dose(s) confirmed: Regimen is correct and unchanged.   Changes to medications: Christian Abbott reports no changes at this time.  Changes to insurance: No  New side effects reported not previously addressed with a pharmacist or physician: None reported  Questions for the pharmacist: No    Confirmed patient received a Conservation officer, historic buildings and a Surveyor, mining with first shipment. The patient will receive a drug information handout for each medication shipped and additional FDA Medication Guides as required.       DISEASE/MEDICATION-SPECIFIC INFORMATION        N/A    SPECIALTY MEDICATION ADHERENCE     Medication Adherence    Patient reported X missed doses in the last month: 0  Specialty Medication: Calquence 100mg   Patient is on additional specialty medications: No  Informant: child/children              Were doses missed due to medication being on hold? No    Calaquence 100 mg: 12 days of medicine on hand         REFERRAL TO PHARMACIST     Referral to the pharmacist: Not needed      [facility]     Shipping address confirmed in Epic.     Delivery Scheduled: Yes, Expected medication delivery date: 07/13/21.     Medication will be delivered via Same Day Courier to the prescription address in Epic WAM.    [name]   [facility] Specialty Technician

## 2021-07-13 MED FILL — CALQUENCE 100 MG CAPSULE: ORAL | 30 days supply | Qty: 60 | Fill #4

## 2021-07-17 DIAGNOSIS — D649 Anemia, unspecified: Principal | ICD-10-CM

## 2021-07-17 DIAGNOSIS — C9110 Chronic lymphocytic leukemia of B-cell type not having achieved remission: Principal | ICD-10-CM

## 2021-07-20 ENCOUNTER — Ambulatory Visit: Admit: 2021-07-20 | Discharge: 2021-07-20 | Payer: MEDICARE

## 2021-07-20 DIAGNOSIS — C9110 Chronic lymphocytic leukemia of B-cell type not having achieved remission: Principal | ICD-10-CM

## 2021-07-20 DIAGNOSIS — D649 Anemia, unspecified: Principal | ICD-10-CM

## 2021-07-20 DIAGNOSIS — R0602 Shortness of breath: Principal | ICD-10-CM

## 2021-07-20 LAB — CBC W/ AUTO DIFF
BASOPHILS ABSOLUTE COUNT: 0 10*9/L (ref 0.0–0.1)
BASOPHILS RELATIVE PERCENT: 0.4 %
EOSINOPHILS ABSOLUTE COUNT: 0.1 10*9/L (ref 0.0–0.5)
EOSINOPHILS RELATIVE PERCENT: 0.9 %
HEMATOCRIT: 28.3 % — ABNORMAL LOW (ref 39.0–48.0)
HEMOGLOBIN: 9.1 g/dL — ABNORMAL LOW (ref 12.9–16.5)
LYMPHOCYTES ABSOLUTE COUNT: 4.6 10*9/L — ABNORMAL HIGH (ref 1.1–3.6)
LYMPHOCYTES RELATIVE PERCENT: 60.4 %
MEAN CORPUSCULAR HEMOGLOBIN CONC: 32.2 g/dL (ref 32.0–36.0)
MEAN CORPUSCULAR HEMOGLOBIN: 27.5 pg (ref 25.9–32.4)
MEAN CORPUSCULAR VOLUME: 85.4 fL (ref 77.6–95.7)
MEAN PLATELET VOLUME: 8.4 fL (ref 6.8–10.7)
MONOCYTES ABSOLUTE COUNT: 0.4 10*9/L (ref 0.3–0.8)
MONOCYTES RELATIVE PERCENT: 5.5 %
NEUTROPHILS ABSOLUTE COUNT: 2.5 10*9/L (ref 1.8–7.8)
NEUTROPHILS RELATIVE PERCENT: 32.8 %
PLATELET COUNT: 94 10*9/L — ABNORMAL LOW (ref 150–450)
RED BLOOD CELL COUNT: 3.32 10*12/L — ABNORMAL LOW (ref 4.26–5.60)
RED CELL DISTRIBUTION WIDTH: 15.9 % — ABNORMAL HIGH (ref 12.2–15.2)
WBC ADJUSTED: 7.6 10*9/L (ref 3.6–11.2)

## 2021-07-20 LAB — COMPREHENSIVE METABOLIC PANEL
ALBUMIN: 3.2 g/dL — ABNORMAL LOW (ref 3.5–5.0)
ALKALINE PHOSPHATASE: 129 U/L — ABNORMAL HIGH (ref 46–116)
ALT (SGPT): 25 U/L (ref 12–78)
ANION GAP: 6 mmol/L (ref 3–11)
AST (SGOT): 14 U/L — ABNORMAL LOW (ref 15–40)
BILIRUBIN TOTAL: 0.3 mg/dL (ref 0.2–1.0)
BLOOD UREA NITROGEN: 36 mg/dL — ABNORMAL HIGH (ref 8–20)
BUN / CREAT RATIO: 27
CALCIUM: 8.8 mg/dL (ref 8.5–10.1)
CHLORIDE: 108 mmol/L — ABNORMAL HIGH (ref 98–107)
CO2: 26.9 mmol/L (ref 21.0–32.0)
CREATININE: 1.32 mg/dL — ABNORMAL HIGH (ref 0.80–1.30)
EGFR CKD-EPI (2021) MALE: 53 mL/min/{1.73_m2} — ABNORMAL LOW (ref >=60)
GLUCOSE RANDOM: 151 mg/dL (ref 70–179)
POTASSIUM: 5.1 mmol/L — ABNORMAL HIGH (ref 3.5–5.0)
PROTEIN TOTAL: 5.7 g/dL — ABNORMAL LOW (ref 6.0–8.0)
SODIUM: 141 mmol/L (ref 135–145)

## 2021-07-20 LAB — LACTATE DEHYDROGENASE: LACTATE DEHYDROGENASE: 120 U/L (ref 81–234)

## 2021-07-20 LAB — IRON & TIBC
IRON SATURATION: 15 % — ABNORMAL LOW (ref 20–55)
IRON: 36 ug/dL — ABNORMAL LOW (ref 50–150)
TOTAL IRON BINDING CAPACITY: 241 ug/dL — ABNORMAL LOW (ref 250–450)

## 2021-07-20 LAB — FERRITIN: FERRITIN: 158 ng/mL (ref 10.5–307.3)

## 2021-07-20 LAB — PRO-BNP: PRO-BNP: 4737 pg/mL — ABNORMAL HIGH (ref 0.0–450.0)

## 2021-07-20 NOTE — Unmapped (Signed)
HEMATOLOGY ONCOLOGY CLINIC:  INTERVAL FOLLOW UP  NOTE    REFERRING PHYSICIAN:  Sidhu, Clinton Quant,*  PCP:  Mliss Fritz, MD    DATE OF ENCOUNTER:  07/20/2021    INTERVAL HISTORY:  Christian Abbott is an 86 y.o. male who comes today for continued follow-up of chronic lymphocytic leukemia.  Patient was admitted at [facility] for COVID-19 pneumonia from 10/06/2020-10/10/2020.  This was after he presented with fevers and shortness of breath he was treated with Decadron and remdesivir and a CT of his chest was consistent with multifocal pneumonia and showed significant mediastinal adenopathy.      Repeat CT chest without IV contrast on 10/16/2020 performed by Dr. Ace Gins revealed improving COVID-19 pneumonia and prominent mediastinal and hilar nodes which appear to be decreased from the prior exam and on 10/06/2020.      Given his advanced disease he was started on acalbrutinib 12/19/20.       On 02/13/21 he had a follow up CT chest with contrast which noted a mixed response in the pulmonary infiltrates with improvemen tin the RUL and left sided infiltrates but worsening intermittently the LLL infiltrates. However the adenopathy in the chest and a abdomen have improved.      He followed up with his cardiologist due to increasing shortness of breath in early October 2022.  On 06/02/2021 chest x-ray showed similar enlargement of the cardio pericardial silhouette and prominence of the central pulmonary vasculature which was suggestive of possible pulmonary vascular congestion.  A BNP level at that time was elevated 658. He was started on lasix and  Dose is actively being managed by cardiology.      today reports ongoing  fatigue. No SOB at rest, CP, new cough, night sweats weight loss. contines on acalbrutinib.         ONCOLOGY HISTORY  Oncology History Overview Note   86 y/o male with long-standing history of chronic lymphocytic leukemia initially diagnosed in 1998  Status post Rituxan based therapy for 8 weeks during that time  History of anemia treated with Procrit off and on  History of hypertension diabetes asthma hypercholesterolemia.  History of hospitalization in January 2014 for bilateral pneumonia [facility] for 3 weeks.     CLL (chronic lymphocytic leukemia) (CMS-HCC)   07/18/1997 Initial Diagnosis    CLL (chronic lymphocytic leukemia)           PAST MEDICAL HISTORY:     Patient Active Problem List   Diagnosis   ??? CLL (chronic lymphocytic leukemia) (CMS-HCC)   ??? Iron deficiency anemia   ??? Hypogammaglobulinemia (CMS-HCC)   ??? Anemia   ??? Need for prophylactic immunotherapy      has no past surgical history on file.    ALLERGIES:  has No Known Allergies.      MEDICATIONS: has a current medication list which includes the following prescription(s): acalabrutinib, acalabrutinib, accu-chek aviva plus test strp, accu-chek guide me glucose mtr, accu-chek softclix lancets, albuterol 2.5 mg /3 mL (0.083 %) Nebu 3 mL, albuterol 5 mg/mL Nebu 0.5 mL, albuterol, amlodipine, amlodipine-atorvastatin, atorvastatin, atorvastatin, bd alcohol swabs, bd ultra-fine short pen needle, chlorhexidine, cholecalciferol (vitamin d3-125 mcg (5,000 unit)), clopidogrel, co-enzyme q-10, co q-10, cyanocobalamin (vitamin b-12), dexamethasone, doxazosin, doxycycline, ergocalciferol-1,250 mcg (50,000 unit), ergocalciferol-1,250 mcg (50,000 unit), famotidine, ferrous sulfate, furosemide, furosemide, glucosamine/chondr su a sod, hydralazine, hydralazine, ipratropium-albuterol, jentadueto, victoza 2-pak, liraglutide, losartan, losartan, metformin, metoprolol su-hydrochlorothiaz, metoprolol succinate, mometasone-formoterol, sitagliptin-metformin, spironolactone, spironolactone, sucralfate, true metrix level 2, victoza 3-pak, bioflex, and zafirlukast,  and the following Facility-Administered Medications: ferric carboxymaltose 750 mg in sodium chloride (NS) 0.9 % 250 mL IVPB.    FAMILY HISTORY / SOCIAL HISTORY:  Reviewed and updated as appropriate.    REVIEW OF SYSTEMS:  As per interval history.  All other systems reviewed and negative.    PHYSICAL EXAM    Vitals: There were no vitals taken for this visit.  General:  Pleasant, no acute distress.    Pain:  Pain Evaluation:                                   Pain Score (0 - 1):                                Pain Location:                                       Eyes:  Appears normal.   HENT:  Atraumatic.   Neck:  thyroid midline.  Lymphatics: Bilateral small cervical lymph nodes.  No supraclavicular axillary or inguinal lymphadenopathy noted   Cardiovascular:  No edema RRR  Respiratory:  Unlabored.  Good air entry b/l. No wheezing.  Mild crackles bilateral bases.  Gastrointestinal: Does not appear distended.  Nontender. No palpable HSM.   Skin:  No rashes or subcutaneous nodules.    Musculoskeletal:  Gait is steady     Psychiatric:  Affect appropriate. Judgment and insight nl.  Neurologic:  Alert and oriented x3. Grossly non-focal.              RECENT LABS/PATHOLOGY:  Results for orders placed or performed in visit on 06/22/21   Iron & TIBC   Result Value Ref Range    Iron 46 (L) 50 - 150 ug/dL    TIBC 161 096 - 045 ug/dL    Iron Saturation (%) 17 (L) 20 - 55 %   Ferritin   Result Value Ref Range    Ferritin 122.5 10.5 - 307.3 ng/mL   Comprehensive Metabolic Panel   Result Value Ref Range    Sodium 133 (L) 135 - 145 mmol/L    Potassium 4.2 3.5 - 5.0 mmol/L    Chloride 100 98 - 107 mmol/L    CO2 26.9 21.0 - 32.0 mmol/L    Anion Gap 6 3 - 11 mmol/L    BUN 50 (H) 8 - 20 mg/dL    Creatinine 4.09 (H) 0.80 - 1.30 mg/dL    BUN/Creatinine Ratio 34     eGFR CKD-EPI (2021) Male 46 (L) >=60 mL/min/1.1m2    Glucose 169 70 - 179 mg/dL    Calcium 8.8 8.5 - 81.1 mg/dL    Albumin 3.4 (L) 3.5 - 5.0 g/dL    Total Protein 5.9 (L) 6.0 - 8.0 g/dL    Total Bilirubin 0.2 0.2 - 1.0 mg/dL    AST 14 (L) 15 - 40 U/L    ALT 17 12 - 78 U/L    Alkaline Phosphatase 135 (H) 46 - 116 U/L   Vitamin B12 Level   Result Value Ref Range    Vitamin B-12 2,825 (H) 211 - 911 pg/ml   Folate Level   Result Value Ref Range    Folate >48.0 ng/mL   CBC w/ Differential   Result  Value Ref Range    WBC 10.3 3.5 - 10.5 10*9/L    RBC 3.85 (L) 4.32 - 5.72 10*12/L    HGB 10.7 (L) 13.5 - 17.5 g/dL    HCT 16.1 (L) 09.6 - 50.0 %    MCV 85.0 81.0 - 95.0 fL    MCH 27.7 26.0 - 34.0 pg    MCHC 32.6 30.0 - 36.0 g/dL    RDW 04.5 40.9 - 81.1 %    MPV 7.6 7.0 - 10.0 fL    Platelet 122 (L) 150 - 450 10*9/L    Neutrophils % 29.1 %    Lymphocytes % 64.4 %    Monocytes % 5.5 %    Eosinophils % 0.6 %    Basophils % 0.4 %    Absolute Neutrophils 3.0 1.7 - 7.7 10*9/L    Absolute Lymphocytes 6.7 (H) 0.7 - 4.0 10*9/L    Absolute Monocytes 0.6 0.1 - 1.0 10*9/L    Absolute Eosinophils 0.1 0.0 - 0.7 10*9/L    Absolute Basophils 0.0 0.0 - 0.1 10*9/L     RADIOLOGY    11/14/2020  PET CT   Patchy consolidative opacities within the lungs, increased from chest CT 10/16/2020, particularly in the lingula with areas FDG avid focus.   This is likely infectious/inflammatory.   Recommend follow-up chest CT in 3 months.  Multiple enlarged lymph nodes in the neck, chest, abdomen and pelvis without significant FDG avidity.   Splenomegaly, without significant FDG avidity      02/13/21 CT chest:  1.  Mixed response of pulmonary infiltrates to interval therapy as detailed above.  The hypermetabolic apicoposterior infiltrate seen previously in the RIGHT upper lobe has largely resolved as have the nodular infiltrates seen previously in the inferior lingula while hypermetabolic infiltrate seen previously in the perihilar and peripheral RIGHT lower lobe is worse.  Follow-up CT the chest is recommended in 3 months.     2.  Interval improvement in mediastinal, bilateral hilar, gastrohepatic ligament, and splenic hilar adenopathy.  The spleen is also slightly smaller.     3.  Stable probably benign 1.7 cm LEFT adrenal mass.  Attention to this on follow-up imaging is indicated.     4.  Moderate to advanced atherosclerosis without aneurysm or dissection.  Multivessel coronary artery disease.  Chronic occlusion of the celiac artery.      05/14/21 CT Chest   1.  Fluctuating pulmonary opacities compared to the prior exams.  Compared to June 2022, these are improved in the perihilar right upper and right lower lobe as well as the dependent left lung base.  They are mildly worse in the right middle lobe, anterolateral right lower lobe,, lingula, and left lower lobe superior segment.  2.  0.4 cm right upper lobe nodule is new compared to June 2022.  Additional discrete small lung nodules are unchanged since February 2022.  Continued attention on follow-up.  3.  Mediastinal, hilar, and gastrohepatic lymphadenopathy is stable to minimally decreased compared to June 2022.  Spleen size is stable.    ASSESSMENT and PLAN:    # Chronic lymphocytic leukemia  --Rai stage IV CLL  --CLL FISH panel shows 13 q. deletion 21.5%, IGHV mutated -good prognostic features  --11/05/2020: T p53 mutation negative.  Notch 1 -.  SF 3 B1 positive.  SF 3 B1 positivity indicates poor prognosis.  -- 07/2019 DAT, haptoglobin, LDH normal-no hemolysis  --Indication for treatment advanced stage and history of thrombocytopenia .  -- He has been  treated with Rituxan many years ago by Dr Thedore Mins, with good response in the 1990s. Treatment ndication at that time may have been frequent infections.  --I reviewed his recent PET scan.  The mediastinal hilar intra-abdominal lymph nodes are without significant avidity thereby unlikely to have Richter's transformation.  Some of the hilar mediastinal adenopathy may be also reactive to his recent COVID-19 pneumonia.  The patchy opacities within the lung appear to be slightly worse compared to 10/16/2020  -- repeat CT chest 02/13/21 noted above. Overall improvement in infiltrates except for LLL. improvemen tin LAD may be c/w response to Acalbrutinib vs improvement in reactive ALD from prior COVID.   -- started acabrutinib 12/19/20  -- today hb 9.1. pl 94 ALC 4.6.   --  chronic fatigue appears to be stable      PLAN:  -- continue Acalbrutinib 100 mg BID.   -- repeat labs and follow in 4 weeks.         # patchy lung opacities  -- most recent CT chest from 05/16/2021 mixed response with regardin the GGOs.  He continues to have no infectious symptoms.  Patient is we would expect after his COVID diagnosis.  He followed up with Dr. Virgia Land recently.  Findings on his CT chest are felt to represent volume overload as noted by a recent elevation in proBNP on 06/02/2021.  No pulmonary intervention was recommended by Dr. Virgia Land at this time.  -- I have ordered a follow up Ct chest in 3 months to ensure no progression of groundglass opacities.   Scheduled 08/13/21      #  Iron deficiency anemia-resolved.   -- He was noted to have iron deficiency in 2014 and has since received IV iron in the past and has been on oral iron 1 pill/day.   -- iron deficiency anemia/ACD in 07/2019 with hb down to 8.7 and ferritin 78, iron sat 12%  -- now s/o 1020 mg ferheme 07/2019 and early 10/2018 with hb improvement to 10.9 today.   --EGD and colonoscopy with Dr. Loreta Ave 01/2020 revealed bleeding gastric ulcer, smaller antral ulcer with old blood. .  Currently being managed with sucralfate.    #. Hypogammaglobulinemia   --History level continues to be low however he has not had any more infections since we started him on IVIG. Before we started him on IVIG he has had multiple hospitalizations for pneumonias.  --Therefore we will continue IVIG for now every 3 months as he is tolerated this well.  --received IVIG  05/28/2021  - next dose in 08/27/2021 in ~  2 month. He has asked if this appointment can be moved to mid Jan since his daughter will be out of town until then. We will plan to schedule this with a follow up with me in mid Jan.       # Health maintenance:  --  Evusheld  Received 12/04/20   -- bivalent booster  06/01/21  -- flu shot 06/01/21  --evusheld  #2 07/01/21 - next due on 12/29/20.           # diastolic CHF with volume overload  -- followed closely by Cardiology. Currently on lasix.    -- he has many questions about dosing of lasix and monitoring   -- I have asked him and daughter to address these questions with his cardiologist.   -- at his request I have ordered a pro-BNP.         DISPO:  RTC in 1 month for  MD visit and labs.   RTC in [facility] Jan 2023 for MD visit labs and IVIG.     Elyse Hsu, MD  [facility]        CC:    [place], [place] Caleb Popp, MD

## 2021-07-22 LAB — B-TYPE NATRIURETIC PEPTIDE: B-TYPE NATRIURETIC PEPTIDE: 428 pg/mL — ABNORMAL HIGH (ref <=100)

## 2021-08-06 NOTE — Unmapped (Signed)
[facility] Refill Coordination Note    Specialty Medication(s) to be Shipped:   Hematology/Oncology: Calquence    Other medication(s) to be shipped: No additional medications requested for fill at this time     Christian Abbott, DOB: 27-Jan-1935  Phone: There are no phone numbers on file.      All above HIPAA information was verified with patient.     Was a Nurse, learning disability used for this call? No    Completed refill call assessment today to schedule patient's medication shipment from the [facility] [phone]).  All relevant notes have been reviewed.     Specialty medication(s) and dose(s) confirmed: Regimen is correct and unchanged.   Changes to medications: Christian Abbott reports no changes at this time.  Changes to insurance: No  New side effects reported not previously addressed with a pharmacist or physician: None reported  Questions for the pharmacist: No    Confirmed patient received a Conservation officer, historic buildings and a Surveyor, mining with first shipment. The patient will receive a drug information handout for each medication shipped and additional FDA Medication Guides as required.       DISEASE/MEDICATION-SPECIFIC INFORMATION        N/A    SPECIALTY MEDICATION ADHERENCE     Medication Adherence    Patient reported X missed doses in the last month: 0  Specialty Medication: Calquence 100mg  tablets  Patient is on additional specialty medications: No  Informant: child/children              Were doses missed due to medication being on hold? No    Calquence 100 mg: 12 days of medicine on hand     REFERRAL TO PHARMACIST     Referral to the pharmacist: Not needed      [facility]     Shipping address confirmed in Epic.     Delivery Scheduled: Yes, Expected medication delivery date: 08/14/21.     Medication will be delivered via Next Day Courier to the prescription address in Epic WAM.    Christian Abbott   [facility] Specialty Technician

## 2021-08-13 ENCOUNTER — Ambulatory Visit: Admit: 2021-08-13 | Discharge: 2021-08-14 | Payer: MEDICARE

## 2021-08-19 DIAGNOSIS — C9110 Chronic lymphocytic leukemia of B-cell type not having achieved remission: Principal | ICD-10-CM

## 2021-08-20 ENCOUNTER — Ambulatory Visit: Admit: 2021-08-20 | Discharge: 2021-08-20 | Payer: MEDICARE

## 2021-08-20 DIAGNOSIS — C9110 Chronic lymphocytic leukemia of B-cell type not having achieved remission: Principal | ICD-10-CM

## 2021-08-20 LAB — CBC W/ AUTO DIFF
BASOPHILS ABSOLUTE COUNT: 0 10*9/L (ref 0.0–0.1)
BASOPHILS RELATIVE PERCENT: 0.4 %
EOSINOPHILS ABSOLUTE COUNT: 0.1 10*9/L (ref 0.0–0.5)
EOSINOPHILS RELATIVE PERCENT: 0.6 %
HEMATOCRIT: 31.6 % — ABNORMAL LOW (ref 39.0–48.0)
HEMOGLOBIN: 10.1 g/dL — ABNORMAL LOW (ref 12.9–16.5)
LYMPHOCYTES ABSOLUTE COUNT: 5.6 10*9/L — ABNORMAL HIGH (ref 1.1–3.6)
LYMPHOCYTES RELATIVE PERCENT: 55.2 %
MEAN CORPUSCULAR HEMOGLOBIN CONC: 31.9 g/dL — ABNORMAL LOW (ref 32.0–36.0)
MEAN CORPUSCULAR HEMOGLOBIN: 27 pg (ref 25.9–32.4)
MEAN CORPUSCULAR VOLUME: 84.7 fL (ref 77.6–95.7)
MEAN PLATELET VOLUME: 7.9 fL (ref 6.8–10.7)
MONOCYTES ABSOLUTE COUNT: 0.6 10*9/L (ref 0.3–0.8)
MONOCYTES RELATIVE PERCENT: 6.4 %
NEUTROPHILS ABSOLUTE COUNT: 3.8 10*9/L (ref 1.8–7.8)
NEUTROPHILS RELATIVE PERCENT: 37.4 %
PLATELET COUNT: 132 10*9/L — ABNORMAL LOW (ref 150–450)
RED BLOOD CELL COUNT: 3.73 10*12/L — ABNORMAL LOW (ref 4.26–5.60)
RED CELL DISTRIBUTION WIDTH: 15.6 % — ABNORMAL HIGH (ref 12.2–15.2)
WBC ADJUSTED: 10.2 10*9/L (ref 3.6–11.2)

## 2021-08-20 LAB — COMPREHENSIVE METABOLIC PANEL
ALBUMIN: 3.6 g/dL (ref 3.5–5.0)
ALKALINE PHOSPHATASE: 140 U/L — ABNORMAL HIGH (ref 46–116)
ALT (SGPT): 21 U/L (ref 12–78)
ANION GAP: 10 mmol/L (ref 3–11)
AST (SGOT): 16 U/L (ref 15–40)
BILIRUBIN TOTAL: 0.4 mg/dL (ref 0.2–1.0)
BLOOD UREA NITROGEN: 54 mg/dL — ABNORMAL HIGH (ref 8–20)
BUN / CREAT RATIO: 31
CALCIUM: 9.1 mg/dL (ref 8.5–10.1)
CHLORIDE: 106 mmol/L (ref 98–107)
CO2: 27.3 mmol/L (ref 21.0–32.0)
CREATININE: 1.73 mg/dL — ABNORMAL HIGH (ref 0.80–1.30)
EGFR CKD-EPI (2021) MALE: 38 mL/min/{1.73_m2} — ABNORMAL LOW (ref >=60)
GLUCOSE RANDOM: 127 mg/dL (ref 70–179)
POTASSIUM: 4.5 mmol/L (ref 3.5–5.0)
PROTEIN TOTAL: 6 g/dL (ref 6.0–8.0)
SODIUM: 143 mmol/L (ref 135–145)

## 2021-08-20 LAB — FERRITIN: FERRITIN: 124 ng/mL (ref 10.5–307.3)

## 2021-08-20 LAB — IRON & TIBC
IRON SATURATION: 11 % — ABNORMAL LOW (ref 20–55)
IRON: 32 ug/dL — ABNORMAL LOW (ref 50–150)
TOTAL IRON BINDING CAPACITY: 297 ug/dL (ref 250–450)

## 2021-08-20 LAB — SLIDE REVIEW

## 2021-08-21 NOTE — Unmapped (Unsigned)
HEMATOLOGY ONCOLOGY CLINIC:  INTERVAL FOLLOW UP  NOTE    REFERRING PHYSICIAN:  Elyse Hsu, MD  PCP:  Mliss Fritz, MD    DATE OF ENCOUNTER:  08/20/2021    INTERVAL HISTORY:  Christian Abbott is an 86 y.o. male who comes today for continued follow-up of chronic lymphocytic leukemia.  Patient was admitted at [facility] for COVID-19 pneumonia from 10/06/2020-10/10/2020.  This was after he presented with fevers and shortness of breath he was treated with Decadron and remdesivir and a CT of his chest was consistent with multifocal pneumonia and showed significant mediastinal adenopathy.      Repeat CT chest without IV contrast on 10/16/2020 performed by Dr. Ace Gins revealed improving COVID-19 pneumonia and prominent mediastinal and hilar nodes which appear to be decreased from the prior exam and on 10/06/2020.      Given his advanced disease he was started on acalbrutinib 12/19/20.       On 02/13/21 he had a follow up CT chest with contrast which noted a mixed response in the pulmonary infiltrates with improvemen tin the RUL and left sided infiltrates but worsening intermittently the LLL infiltrates. However the adenopathy in the chest and a abdomen have improved.      He followed up with his cardiologist due to increasing shortness of breath in early October 2022.  On 06/02/2021 chest x-ray showed similar enlargement of the cardio pericardial silhouette and prominence of the central pulmonary vasculature which was suggestive of possible pulmonary vascular congestion.  A BNP level at that time was elevated 658. He was started on lasix and  Dose is actively being managed by cardiology.      today reports ongoing  fatigue. No SOB at rest, CP, new cough, night sweats weight loss. contines on acalbrutinib.         ONCOLOGY HISTORY  Oncology History Overview Note   86 y/o male with long-standing history of chronic lymphocytic leukemia initially diagnosed in 1998  Status post Rituxan based therapy for 8 weeks during that time  History of anemia treated with Procrit off and on  History of hypertension diabetes asthma hypercholesterolemia.  History of hospitalization in January 2014 for bilateral pneumonia [facility] for 3 weeks.     CLL (chronic lymphocytic leukemia) (CMS-HCC)   07/18/1997 Initial Diagnosis    CLL (chronic lymphocytic leukemia)           PAST MEDICAL HISTORY:     Patient Active Problem List   Diagnosis   ??? CLL (chronic lymphocytic leukemia) (CMS-HCC)   ??? Iron deficiency anemia   ??? Hypogammaglobulinemia (CMS-HCC)   ??? Anemia   ??? Need for prophylactic immunotherapy      has no past surgical history on file.    ALLERGIES:  has No Known Allergies.      MEDICATIONS: has a current medication list which includes the following prescription(s): acalabrutinib, acalabrutinib, accu-chek aviva plus test strp, accu-chek guide me glucose mtr, accu-chek softclix lancets, albuterol 2.5 mg /3 mL (0.083 %) Nebu 3 mL, albuterol 5 mg/mL Nebu 0.5 mL, albuterol, atorvastatin, atorvastatin, bd alcohol swabs, bd ultra-fine short pen needle, bumetanide, chlorhexidine, cholecalciferol (vitamin d3-125 mcg (5,000 unit)), clopidogrel, co-enzyme q-10, co q-10, cyanocobalamin (vitamin b-12), dexamethasone, doxazosin, doxycycline, empagliflozin, ergocalciferol-1,250 mcg (50,000 unit), ergocalciferol-1,250 mcg (50,000 unit), famotidine, ferrous sulfate, glucosamine/chondr su a sod, hydralazine, hydralazine, ipratropium-albuterol, jentadueto, victoza 2-pak, liraglutide, losartan, losartan, magnesium oxide, metformin, metoprolol su-hydrochlorothiaz, metoprolol succinate, mometasone-formoterol, omeprazole, sitagliptin-metformin, spironolactone, spironolactone, sucralfate, true metrix level 2, victoza 3-pak, bioflex, zafirlukast,  amlodipine, amlodipine-atorvastatin, furosemide, and furosemide, and the following Facility-Administered Medications: ferric carboxymaltose 750 mg in sodium chloride (NS) 0.9 % 250 mL IVPB.    FAMILY HISTORY / SOCIAL HISTORY:  Reviewed and updated as appropriate.    REVIEW OF SYSTEMS:  As per interval history.  All other systems reviewed and negative.    PHYSICAL EXAM    Vitals: BP 147/66  - Pulse 67  - Temp 36.7 ??C (98 ??F) (Temporal)  - Resp 16  - Ht 165.1 cm (5' 5)  - Wt 68.8 kg (151 lb 10.8 oz)  - SpO2 98%  - BMI 25.24 kg/m??   General:  Pleasant, no acute distress.    Pain:  Pain Evaluation:                                   Pain Score (0 - 1):                                Pain Location:                                       Eyes:  Appears normal.   HENT:  Atraumatic.   Neck:  thyroid midline.  Lymphatics: Bilateral small cervical lymph nodes.  No supraclavicular axillary or inguinal lymphadenopathy noted   Cardiovascular:  No edema RRR  Respiratory:  Unlabored.  Good air entry b/l. No wheezing.  Mild crackles bilateral bases.  Gastrointestinal: Does not appear distended.  Nontender. No palpable HSM.   Skin:  No rashes or subcutaneous nodules.    Musculoskeletal:  Gait is steady     Psychiatric:  Affect appropriate. Judgment and insight nl.  Neurologic:  Alert and oriented x3. Grossly non-focal.              RECENT LABS/PATHOLOGY:  Results for orders placed or performed in visit on 08/20/21   Iron & TIBC   Result Value Ref Range    Iron 32 (L) 50 - 150 ug/dL    TIBC 161 096 - 045 ug/dL    Iron Saturation (%) 11 (L) 20 - 55 %   Ferritin   Result Value Ref Range    Ferritin 124.0 10.5 - 307.3 ng/mL   Comprehensive Metabolic Panel   Result Value Ref Range    Sodium 143 135 - 145 mmol/L    Potassium 4.5 3.5 - 5.0 mmol/L    Chloride 106 98 - 107 mmol/L    CO2 27.3 21.0 - 32.0 mmol/L    Anion Gap 10 3 - 11 mmol/L    BUN 54 (H) 8 - 20 mg/dL    Creatinine 4.09 (H) 0.80 - 1.30 mg/dL    BUN/Creatinine Ratio 31     eGFR CKD-EPI (2021) Male 38 (L) >=60 mL/min/1.39m2    Glucose 127 70 - 179 mg/dL    Calcium 9.1 8.5 - 81.1 mg/dL    Albumin 3.6 3.5 - 5.0 g/dL    Total Protein 6.0 6.0 - 8.0 g/dL    Total Bilirubin 0.4 0.2 - 1.0 mg/dL AST 16 15 - 40 U/L    ALT 21 12 - 78 U/L    Alkaline Phosphatase 140 (H) 46 - 116 U/L   CBC w/ Differential   Result Value Ref Range  Results Verified by Slide Scan Slide Reviewed     WBC 10.2 3.6 - 11.2 10*9/L    RBC 3.73 (L) 4.26 - 5.60 10*12/L    HGB 10.1 (L) 12.9 - 16.5 g/dL    HCT 69.6 (L) 29.5 - 48.0 %    MCV 84.7 77.6 - 95.7 fL    MCH 27.0 25.9 - 32.4 pg    MCHC 31.9 (L) 32.0 - 36.0 g/dL    RDW 28.4 (H) 13.2 - 15.2 %    MPV 7.9 6.8 - 10.7 fL    Platelet 132 (L) 150 - 450 10*9/L    Neutrophils % 37.4 %    Lymphocytes % 55.2 %    Monocytes % 6.4 %    Eosinophils % 0.6 %    Basophils % 0.4 %    Absolute Neutrophils 3.8 1.8 - 7.8 10*9/L    Absolute Lymphocytes 5.6 (H) 1.1 - 3.6 10*9/L    Absolute Monocytes 0.6 0.3 - 0.8 10*9/L    Absolute Eosinophils 0.1 0.0 - 0.5 10*9/L    Absolute Basophils 0.0 0.0 - 0.1 10*9/L   Morphology Review   Result Value Ref Range    Rouleaux       RADIOLOGY    11/14/2020  PET CT   Patchy consolidative opacities within the lungs, increased from chest CT 10/16/2020, particularly in the lingula with areas FDG avid focus.   This is likely infectious/inflammatory.   Recommend follow-up chest CT in 3 months.  Multiple enlarged lymph nodes in the neck, chest, abdomen and pelvis without significant FDG avidity.   Splenomegaly, without significant FDG avidity      02/13/21 CT chest:  1.  Mixed response of pulmonary infiltrates to interval therapy as detailed above.  The hypermetabolic apicoposterior infiltrate seen previously in the RIGHT upper lobe has largely resolved as have the nodular infiltrates seen previously in the inferior lingula while hypermetabolic infiltrate seen previously in the perihilar and peripheral RIGHT lower lobe is worse.  Follow-up CT the chest is recommended in 3 months.     2.  Interval improvement in mediastinal, bilateral hilar, gastrohepatic ligament, and splenic hilar adenopathy.  The spleen is also slightly smaller.     3.  Stable probably benign 1.7 cm LEFT adrenal mass.  Attention to this on follow-up imaging is indicated.     4.  Moderate to advanced atherosclerosis without aneurysm or dissection.  Multivessel coronary artery disease.  Chronic occlusion of the celiac artery.      05/14/21 CT Chest   1.  Fluctuating pulmonary opacities compared to the prior exams.  Compared to June 2022, these are improved in the perihilar right upper and right lower lobe as well as the dependent left lung base.  They are mildly worse in the right middle lobe, anterolateral right lower lobe,, lingula, and left lower lobe superior segment.  2.  0.4 cm right upper lobe nodule is new compared to June 2022.  Additional discrete small lung nodules are unchanged since February 2022.  Continued attention on follow-up.  3.  Mediastinal, hilar, and gastrohepatic lymphadenopathy is stable to minimally decreased compared to June 2022.  Spleen size is stable.    ASSESSMENT and PLAN:    # Chronic lymphocytic leukemia  --Rai stage IV CLL  --CLL FISH panel shows 13 q. deletion 21.5%, IGHV mutated -good prognostic features  --11/05/2020: T p53 mutation negative.  Notch 1 -.  SF 3 B1 positive.  SF 3 B1 positivity indicates poor prognosis.  --  07/2019 DAT, haptoglobin, LDH normal-no hemolysis  --Indication for treatment advanced stage and history of thrombocytopenia .  -- He has been treated with Rituxan many years ago by Dr Thedore Mins, with good response in the 1990s. Treatment ndication at that time may have been frequent infections.  --I reviewed his recent PET scan.  The mediastinal hilar intra-abdominal lymph nodes are without significant avidity thereby unlikely to have Richter's transformation.  Some of the hilar mediastinal adenopathy may be also reactive to his recent COVID-19 pneumonia.  The patchy opacities within the lung appear to be slightly worse compared to 10/16/2020  -- repeat CT chest 02/13/21 noted above. Overall improvement in infiltrates except for LLL. improvemen tin LAD may be c/w response to Acalbrutinib vs improvement in reactive ALD from prior COVID.   -- started acabrutinib 12/19/20  -- today hb 9.1. pl 94 ALC 4.6.   --  chronic fatigue appears to be stable      PLAN:  -- continue Acalbrutinib 100 mg BID.   -- repeat labs and follow in 4 weeks.         # patchy lung opacities  -- most recent CT chest from 05/16/2021 mixed response with regardin the GGOs.  He continues to have no infectious symptoms.  Patient is we would expect after his COVID diagnosis.  He followed up with Dr. Virgia Land recently.  Findings on his CT chest are felt to represent volume overload as noted by a recent elevation in proBNP on 06/02/2021.  No pulmonary intervention was recommended by Dr. Virgia Land at this time.  -- I have ordered a follow up Ct chest in 3 months to ensure no progression of groundglass opacities.   Scheduled 08/13/21      #  Iron deficiency anemia-resolved.   -- He was noted to have iron deficiency in 2014 and has since received IV iron in the past and has been on oral iron 1 pill/day.   -- iron deficiency anemia/ACD in 07/2019 with hb down to 8.7 and ferritin 78, iron sat 12%  -- now s/o 1020 mg ferheme 07/2019 and early 10/2018 with hb improvement to 10.9 today.   --EGD and colonoscopy with Dr. Loreta Ave 01/2020 revealed bleeding gastric ulcer, smaller antral ulcer with old blood. .  Currently being managed with sucralfate.    #. Hypogammaglobulinemia   --History level continues to be low however he has not had any more infections since we started him on IVIG. Before we started him on IVIG he has had multiple hospitalizations for pneumonias.  --Therefore we will continue IVIG for now every 3 months as he is tolerated this well.  --received IVIG  05/28/2021  - next dose in 08/27/2021 in ~  2 month. He has asked if this appointment can be moved to mid Jan since his daughter will be out of town until then. We will plan to schedule this with a follow up with me in mid Jan.       # Health maintenance:  --  Evusheld Received 12/04/20   -- bivalent booster  06/01/21  -- flu shot 06/01/21  --evusheld  #2 07/01/21 - next due on 12/29/20.           # diastolic CHF with volume overload  -- followed closely by Cardiology. Currently on lasix.    -- he has many questions about dosing of lasix and monitoring   -- I have asked him and daughter to address these questions with his cardiologist.   -- at his  request I have ordered a pro-BNP.         DISPO:  RTC in 1 month for MD visit and labs.   RTC in [facility] Jan 2023 for MD visit labs and IVIG.     Elyse Hsu, MD  [facility]        CC:    Elyse Hsu, MD   Mliss Fritz, MD

## 2021-09-04 NOTE — Unmapped (Signed)
[facility] Refill Coordination Note    Specialty Medication(s) to be Shipped:   Hematology/Oncology: Calquence 100mg     Other medication(s) to be shipped: No additional medications requested for fill at this time     Christian Abbott, DOB: 1934-12-16  Phone: There are no phone numbers on file.      All above HIPAA information was verified with patient.     Was a Nurse, learning disability used for this call? No    Completed refill call assessment today to schedule patient's medication shipment from the [facility] [phone]).  All relevant notes have been reviewed.     Specialty medication(s) and dose(s) confirmed: Regimen is correct and unchanged.   Changes to medications: Christian Abbott reports no changes at this time.  Changes to insurance: No  New side effects reported not previously addressed with a pharmacist or physician: None reported  Questions for the pharmacist: No    Confirmed patient received a Conservation officer, historic buildings and a Surveyor, mining with first shipment. The patient will receive a drug information handout for each medication shipped and additional FDA Medication Guides as required.       DISEASE/MEDICATION-SPECIFIC INFORMATION        N/A    SPECIALTY MEDICATION ADHERENCE     Medication Adherence    Patient reported X missed doses in the last month: 0  Specialty Medication: CALQUENCE 100 mg tablet (acalabrutinib)  Patient is on additional specialty medications: No  Informant: patient              Were doses missed due to medication being on hold? No    Calquence 100 mg: 10 days of medicine on hand     REFERRAL TO PHARMACIST     Referral to the pharmacist: Not needed      [facility]     Shipping address confirmed in Epic.     Delivery Scheduled: Yes, Expected medication delivery date: 09/08/21.     Medication will be delivered via Next Day Courier to the temporary address in Epic WAM.    Christian Abbott   [facility] Specialty Technician

## 2021-09-07 MED FILL — CALQUENCE (ACALABRUTINIB MALEATE) 100 MG TABLET: ORAL | 30 days supply | Qty: 60 | Fill #1

## 2021-09-15 DIAGNOSIS — D801 Nonfamilial hypogammaglobulinemia: Principal | ICD-10-CM

## 2021-09-15 DIAGNOSIS — D509 Iron deficiency anemia, unspecified: Principal | ICD-10-CM

## 2021-09-15 DIAGNOSIS — C9110 Chronic lymphocytic leukemia of B-cell type not having achieved remission: Principal | ICD-10-CM

## 2021-09-16 ENCOUNTER — Ambulatory Visit: Admit: 2021-09-16 | Discharge: 2021-09-16 | Payer: MEDICARE

## 2021-09-16 DIAGNOSIS — D801 Nonfamilial hypogammaglobulinemia: Principal | ICD-10-CM

## 2021-09-16 DIAGNOSIS — D509 Iron deficiency anemia, unspecified: Principal | ICD-10-CM

## 2021-09-16 DIAGNOSIS — C9110 Chronic lymphocytic leukemia of B-cell type not having achieved remission: Principal | ICD-10-CM

## 2021-09-16 LAB — CBC W/ AUTO DIFF
BASOPHILS ABSOLUTE COUNT: 0 10*9/L (ref 0.0–0.1)
BASOPHILS RELATIVE PERCENT: 0.6 %
EOSINOPHILS ABSOLUTE COUNT: 0 10*9/L (ref 0.0–0.5)
EOSINOPHILS RELATIVE PERCENT: 0.7 %
HEMATOCRIT: 30.6 % — ABNORMAL LOW (ref 39.0–48.0)
HEMOGLOBIN: 9.7 g/dL — ABNORMAL LOW (ref 12.9–16.5)
LYMPHOCYTES ABSOLUTE COUNT: 4.5 10*9/L — ABNORMAL HIGH (ref 1.1–3.6)
LYMPHOCYTES RELATIVE PERCENT: 65.7 %
MEAN CORPUSCULAR HEMOGLOBIN CONC: 31.6 g/dL — ABNORMAL LOW (ref 32.0–36.0)
MEAN CORPUSCULAR HEMOGLOBIN: 26.8 pg (ref 25.9–32.4)
MEAN CORPUSCULAR VOLUME: 84.8 fL (ref 77.6–95.7)
MEAN PLATELET VOLUME: 7.8 fL (ref 6.8–10.7)
MONOCYTES ABSOLUTE COUNT: 0.5 10*9/L (ref 0.3–0.8)
MONOCYTES RELATIVE PERCENT: 6.7 %
NEUTROPHILS ABSOLUTE COUNT: 1.8 10*9/L (ref 1.8–7.8)
NEUTROPHILS RELATIVE PERCENT: 26.3 %
PLATELET COUNT: 127 10*9/L — ABNORMAL LOW (ref 150–450)
RED BLOOD CELL COUNT: 3.61 10*12/L — ABNORMAL LOW (ref 4.26–5.60)
RED CELL DISTRIBUTION WIDTH: 15 % (ref 12.2–15.2)
WBC ADJUSTED: 6.9 10*9/L (ref 3.6–11.2)

## 2021-09-16 LAB — SLIDE REVIEW

## 2021-09-16 LAB — COMPREHENSIVE METABOLIC PANEL
ALBUMIN: 3.5 g/dL (ref 3.5–5.0)
ALKALINE PHOSPHATASE: 125 U/L — ABNORMAL HIGH (ref 46–116)
ALT (SGPT): 22 U/L (ref 12–78)
ANION GAP: 8 mmol/L (ref 3–11)
AST (SGOT): 15 U/L (ref 15–40)
BILIRUBIN TOTAL: 0.4 mg/dL (ref 0.2–1.0)
BLOOD UREA NITROGEN: 58 mg/dL — ABNORMAL HIGH (ref 8–20)
BUN / CREAT RATIO: 31
CALCIUM: 9.1 mg/dL (ref 8.5–10.1)
CHLORIDE: 104 mmol/L (ref 98–107)
CO2: 28.6 mmol/L (ref 21.0–32.0)
CREATININE: 1.89 mg/dL — ABNORMAL HIGH (ref 0.80–1.30)
EGFR CKD-EPI (2021) MALE: 35 mL/min/{1.73_m2} — ABNORMAL LOW (ref >=60)
GLUCOSE RANDOM: 214 mg/dL — ABNORMAL HIGH (ref 70–179)
POTASSIUM: 3.9 mmol/L (ref 3.5–5.0)
PROTEIN TOTAL: 5.8 g/dL — ABNORMAL LOW (ref 6.0–8.0)
SODIUM: 141 mmol/L (ref 135–145)

## 2021-09-16 LAB — IRON & TIBC
IRON SATURATION: 11 % — ABNORMAL LOW (ref 20–55)
IRON: 32 ug/dL — ABNORMAL LOW (ref 50–150)
TOTAL IRON BINDING CAPACITY: 286 ug/dL (ref 250–450)

## 2021-09-16 LAB — IGG: GAMMAGLOBULIN; IGG: 178 mg/dL — ABNORMAL LOW (ref 646–2013)

## 2021-09-16 LAB — FERRITIN: FERRITIN: 102 ng/mL (ref 10.5–307.3)

## 2021-09-16 MED ADMIN — acetaminophen (TYLENOL) tablet 650 mg: 650 mg | ORAL | @ 17:00:00 | Stop: 2021-09-16

## 2021-09-16 MED ADMIN — diphenhydrAMINE (BENADRYL) capsule/tablet 25 mg: 25 mg | ORAL | @ 17:00:00 | Stop: 2021-09-16

## 2021-09-16 MED ADMIN — sodium chloride (NS) 0.9 % infusion: 20 mL/h | INTRAVENOUS | @ 17:00:00 | Stop: 2021-09-16

## 2021-09-16 MED ADMIN — immun glob G(IgG)-pro-IgA 0-50 (PRIVIGEN) 10 % intravenous solution 25 g: .4 g/kg | INTRAVENOUS | @ 18:00:00 | Stop: 2021-09-16

## 2021-09-16 NOTE — Unmapped (Signed)
Latest Reference Range & Units 09/16/21 10:59   Slide Scan  Slide Reviewed   WBC 3.6 - 11.2 10*9/L 6.9   RBC 4.26 - 5.60 10*12/L 3.61 (L)   HGB 12.9 - 16.5 g/dL 9.7 (L)   HCT 16.1 - 09.6 % 30.6 (L)   MCV 77.6 - 95.7 fL 84.8   MCH 25.9 - 32.4 pg 26.8   MCHC 32.0 - 36.0 g/dL 04.5 (L)   RDW 40.9 - 81.1 % 15.0   MPV 6.8 - 10.7 fL 7.8   Platelet 150 - 450 10*9/L 127 (L)   Neutrophils % % 26.3   Lymphocytes % % 65.7   Monocytes % % 6.7   Eosinophils % % 0.7   Basophils % % 0.6   Absolute Neutrophils 1.8 - 7.8 10*9/L 1.8   Absolute Lymphocytes 1.1 - 3.6 10*9/L 4.5 (H)   Absolute Monocytes  0.3 - 0.8 10*9/L 0.5   Absolute Eosinophils 0.0 - 0.5 10*9/L 0.0   Absolute Basophils  0.0 - 0.1 10*9/L 0.0   Rouleaux Not Present  Present !   Sodium 135 - 145 mmol/L 141   Potassium 3.5 - 5.0 mmol/L 3.9   Chloride 98 - 107 mmol/L 104   CO2 21.0 - 32.0 mmol/L 28.6   Bun 8 - 20 mg/dL 58 (H)   Creatinine 9.14 - 1.30 mg/dL 7.82 (H)   BUN/Creatinine Ratio  31   eGFR CKD-EPI (2021) Male >=60 mL/min/1.11m2 35 (L)   Anion Gap 3 - 11 mmol/L 8   Glucose 70 - 179 mg/dL 956 (H)   Calcium 8.5 - 10.1 mg/dL 9.1   Albumin 3.5 - 5.0 g/dL 3.5   Total Protein 6.0 - 8.0 g/dL 5.8 (L)   Total Bilirubin 0.2 - 1.0 mg/dL 0.4   AST 15 - 40 U/L 15   ALT 12 - 78 U/L 22   Alkaline Phosphatase 46 - 116 U/L 125 (H)   Iron 50 - 150 ug/dL 32 (L)   TIBC 213 - 086 ug/dL 578   Iron Saturation (%) 20 - 55 % 11 (L)   Ferritin 10.5 - 307.3 ng/mL 102.0   (L): Data is abnormally low  (H): Data is abnormally high  !: Data is abnormal

## 2021-09-16 NOTE — Unmapped (Signed)
IVIG Infusion completed, tolerated well, VSS.  Patient provided blankets, drinks, and snacks as needed throughout infusion.  Comfort continually assessed by staff.  PIV de-accessed per [place] protocol. Patient discharged in stable condition, ambulatory.  Return to clinic information provided.

## 2021-09-18 NOTE — Unmapped (Signed)
HEMATOLOGY ONCOLOGY CLINIC:  INTERVAL FOLLOW UP  NOTE    REFERRING PHYSICIAN:  Sidhu, Clinton Quant,*  PCP:  Mliss Fritz, MD    DATE OF ENCOUNTER:  09/16/2021    INTERVAL HISTORY:  Christian Abbott is an 87 y.o. male who comes today for continued follow-up of chronic lymphocytic leukemia.  Patient was admitted at [facility] for COVID-19 pneumonia from 10/06/2020-10/10/2020.  This was after he presented with fevers and shortness of breath he was treated with Decadron and remdesivir and a CT of his chest was consistent with multifocal pneumonia and showed significant mediastinal adenopathy.      Repeat CT chest without IV contrast on 10/16/2020 performed by Dr. Ace Gins revealed improving COVID-19 pneumonia and prominent mediastinal and hilar nodes which appear to be decreased from the prior exam and on 10/06/2020.      Given his advanced disease he was started on acalbrutinib 12/19/20.       On 02/13/21 he had a follow up CT chest with contrast which noted a mixed response in the pulmonary infiltrates with improvemen tin the RUL and left sided infiltrates but worsening intermittently the LLL infiltrates. However the adenopathy in the chest and a abdomen have improved.      He followed up with his cardiologist due to increasing shortness of breath in early October 2022.  On 06/02/2021 chest x-ray showed similar enlargement of the cardio pericardial silhouette and prominence of the central pulmonary vasculature which was suggestive of possible pulmonary vascular congestion.  A BNP level at that time was elevated 658. He was started on lasix and  Dose is actively being managed by cardiology.     On 08/13/21 he had a follow up CT chest which showed improved aeration and stable to improved mediastinal LAD.     He is following closely with cardiology to manage his fluid status with lasix while trying to avoid prerenal renal injury frm volume depletion.  He is now on bumex.     Reports fatigue hs been gradually worsening. Feels that his lower extremity swelling is improved. No B symptoms. Accompanied today by his very supportive daughter.       ONCOLOGY HISTORY  Oncology History Overview Note   87 y/o male with long-standing history of chronic lymphocytic leukemia initially diagnosed in 1998  Status post Rituxan based therapy for 8 weeks during that time  History of anemia treated with Procrit off and on  History of hypertension diabetes asthma hypercholesterolemia.  History of hospitalization in January 2014 for bilateral pneumonia [facility] for 3 weeks.     CLL (chronic lymphocytic leukemia) (CMS-HCC)   07/18/1997 Initial Diagnosis    CLL (chronic lymphocytic leukemia)           PAST MEDICAL HISTORY:     Patient Active Problem List   Diagnosis   ??? CLL (chronic lymphocytic leukemia) (CMS-HCC)   ??? Iron deficiency anemia   ??? Hypogammaglobulinemia (CMS-HCC)   ??? Anemia   ??? Need for prophylactic immunotherapy      has no past surgical history on file.    ALLERGIES:  has No Known Allergies.      MEDICATIONS: has a current medication list which includes the following prescription(s): acalabrutinib, acalabrutinib, accu-chek aviva plus test strp, accu-chek guide me glucose mtr, accu-chek softclix lancets, albuterol 2.5 mg /3 mL (0.083 %) Nebu 3 mL, albuterol 5 mg/mL Nebu 0.5 mL, albuterol, amlodipine, amlodipine-atorvastatin, atorvastatin, atorvastatin, bd alcohol swabs, bd ultra-fine short pen needle, bumetanide, chlorhexidine, cholecalciferol (vitamin d3-125  mcg (5,000 unit)), clopidogrel, co-enzyme q-10, co q-10, cyanocobalamin (vitamin b-12), dexamethasone, doxazosin, doxycycline, empagliflozin, ergocalciferol-1,250 mcg (50,000 unit), ergocalciferol-1,250 mcg (50,000 unit), famotidine, ferrous sulfate, furosemide, furosemide, glucosamine/chondr su a sod, hydralazine, hydralazine, ipratropium-albuterol, jentadueto, victoza 2-pak, liraglutide, losartan, losartan, magnesium oxide, metformin, metoprolol su-hydrochlorothiaz, metoprolol succinate, mometasone-formoterol, omeprazole, sitagliptin-metformin, spironolactone, spironolactone, sucralfate, true metrix level 2, victoza 3-pak, bioflex, and zafirlukast, and the following Facility-Administered Medications: ferric carboxymaltose 750 mg in sodium chloride (NS) 0.9 % 250 mL IVPB.    FAMILY HISTORY / SOCIAL HISTORY:  Reviewed and updated as appropriate.    REVIEW OF SYSTEMS:  As per interval history.  All other systems reviewed and negative.    PHYSICAL EXAM    Vitals: There were no vitals taken for this visit.  General:  Pleasant, no acute distress.    Pain:  Pain Evaluation:                                   Pain Score (0 - 1):                                Pain Location:                                       Eyes:  Appears normal.   HENT:  Atraumatic.   Neck:  thyroid midline.  Lymphatics: Bilateral small cervical lymph nodes.  No supraclavicular axillary or inguinal lymphadenopathy noted   Cardiovascular:   RRR, trace edema b/l ankles.   Respiratory:  Unlabored.  Good air entry b/l. No wheezing.  CTABL   Gastrointestinal: Does not appear distended.  Nontender. No palpable HSM.   Skin:  No rashes or subcutaneous nodules.    Musculoskeletal:  Gait is steady     Psychiatric:  Affect appropriate. Judgment and insight nl.  Neurologic:  Alert and oriented x3. Grossly non-focal.              RECENT LABS/PATHOLOGY:  Results for orders placed or performed in visit on 09/16/21   Comprehensive Metabolic Panel   Result Value Ref Range    Sodium 141 135 - 145 mmol/L    Potassium 3.9 3.5 - 5.0 mmol/L    Chloride 104 98 - 107 mmol/L    CO2 28.6 21.0 - 32.0 mmol/L    Anion Gap 8 3 - 11 mmol/L    BUN 58 (H) 8 - 20 mg/dL    Creatinine 1.61 (H) 0.80 - 1.30 mg/dL    BUN/Creatinine Ratio 31     eGFR CKD-EPI (2021) Male 35 (L) >=60 mL/min/1.88m2    Glucose 214 (H) 70 - 179 mg/dL    Calcium 9.1 8.5 - 09.6 mg/dL    Albumin 3.5 3.5 - 5.0 g/dL    Total Protein 5.8 (L) 6.0 - 8.0 g/dL    Total Bilirubin 0.4 0.2 - 1.0 mg/dL    AST 15 15 - 40 U/L    ALT 22 12 - 78 U/L    Alkaline Phosphatase 125 (H) 46 - 116 U/L   Iron & TIBC   Result Value Ref Range    Iron 32 (L) 50 - 150 ug/dL    TIBC 045 409 - 811 ug/dL    Iron Saturation (%) 11 (L) 20 - 55 %  Ferritin   Result Value Ref Range    Ferritin 102.0 10.5 - 307.3 ng/mL   IgG   Result Value Ref Range    Total IgG 178 (L) 646 - 2,013 mg/dL   CBC w/ Differential   Result Value Ref Range    Results Verified by Slide Scan Slide Reviewed     WBC 6.9 3.6 - 11.2 10*9/L    RBC 3.61 (L) 4.26 - 5.60 10*12/L    HGB 9.7 (L) 12.9 - 16.5 g/dL    HCT 16.1 (L) 09.6 - 48.0 %    MCV 84.8 77.6 - 95.7 fL    MCH 26.8 25.9 - 32.4 pg    MCHC 31.6 (L) 32.0 - 36.0 g/dL    RDW 04.5 40.9 - 81.1 %    MPV 7.8 6.8 - 10.7 fL    Platelet 127 (L) 150 - 450 10*9/L    Neutrophils % 26.3 %    Lymphocytes % 65.7 %    Monocytes % 6.7 %    Eosinophils % 0.7 %    Basophils % 0.6 %    Absolute Neutrophils 1.8 1.8 - 7.8 10*9/L    Absolute Lymphocytes 4.5 (H) 1.1 - 3.6 10*9/L    Absolute Monocytes 0.5 0.3 - 0.8 10*9/L    Absolute Eosinophils 0.0 0.0 - 0.5 10*9/L    Absolute Basophils 0.0 0.0 - 0.1 10*9/L   Morphology Review   Result Value Ref Range    Rouleaux Present (A) Not Present     RADIOLOGY    11/14/2020  PET CT   Patchy consolidative opacities within the lungs, increased from chest CT 10/16/2020, particularly in the lingula with areas FDG avid focus.   This is likely infectious/inflammatory.   Recommend follow-up chest CT in 3 months.  Multiple enlarged lymph nodes in the neck, chest, abdomen and pelvis without significant FDG avidity.   Splenomegaly, without significant FDG avidity      02/13/21 CT chest:  1.  Mixed response of pulmonary infiltrates to interval therapy as detailed above.  The hypermetabolic apicoposterior infiltrate seen previously in the RIGHT upper lobe has largely resolved as have the nodular infiltrates seen previously in the inferior lingula while hypermetabolic infiltrate seen previously in the perihilar and peripheral RIGHT lower lobe is worse.  Follow-up CT the chest is recommended in 3 months.     2.  Interval improvement in mediastinal, bilateral hilar, gastrohepatic ligament, and splenic hilar adenopathy.  The spleen is also slightly smaller.     3.  Stable probably benign 1.7 cm LEFT adrenal mass.  Attention to this on follow-up imaging is indicated.     4.  Moderate to advanced atherosclerosis without aneurysm or dissection.  Multivessel coronary artery disease.  Chronic occlusion of the celiac artery.      05/14/21 CT Chest   1.  Fluctuating pulmonary opacities compared to the prior exams.  Compared to June 2022, these are improved in the perihilar right upper and right lower lobe as well as the dependent left lung base.  They are mildly worse in the right middle lobe, anterolateral right lower lobe,, lingula, and left lower lobe superior segment.  2.  0.4 cm right upper lobe nodule is new compared to June 2022.  Additional discrete small lung nodules are unchanged since February 2022.  Continued attention on follow-up.  3.  Mediastinal, hilar, and gastrohepatic lymphadenopathy is stable to minimally decreased compared to June 2022.  Spleen size is stable.    08/13/21 CT  chest   IMPRESSION:  1. Overall improved aeration in the bilateral lungs, suggesting improved infectious or inflammatory pneumonitis since prior examination. Persistent linear and subpleural reticular opacities bilaterally, suggestive of residual disease.  2. Scattered micronodules bilaterally, unchanged since recent prior examinations. No new, or enlarging nodules are noted. Recommend follow-up examination in 6-12 months based on Fleischner Society criteria.  3. Mediastinal and hilar lymph nodes appear relatively unchanged to slightly improved in the interval. No new or enlarging adenopathy.    ASSESSMENT and PLAN:    # Chronic lymphocytic leukemia  --Rai stage IV CLL  --CLL FISH panel shows 13 q. deletion 21.5%, IGHV mutated -good prognostic features  --11/05/2020: T p53 mutation negative.  Notch 1 -.  SF 3 B1 positive.  SF 3 B1 positivity indicates poor prognosis.  -- 07/2019 DAT, haptoglobin, LDH normal-no hemolysis  --Indication for treatment advanced stage and history of thrombocytopenia .  -- He has been treated with Rituxan many years ago by Dr Thedore Mins, with good response in the 1990s. Treatment ndication at that time may have been frequent infections.  --I reviewed his recent PET scan.  The mediastinal hilar intra-abdominal lymph nodes are without significant avidity thereby unlikely to have Richter's transformation.  Some of the hilar mediastinal adenopathy may be also reactive to his recent COVID-19 pneumonia.  The patchy opacities within the lung appear to be slightly worse compared to 10/16/2020  -- repeat CT chest 02/13/21 noted above. Overall improvement in infiltrates except for LLL. improvemen tin LAD may be c/w response to Acalbrutinib vs improvement in reactive ALD from prior COVID.   -- started acabrutinib 12/19/20  -- today hb 9.7. plt 127 , ALC 4.5.  ALC continue to gradually decline. No e/o  POD.   --  chronic fatigue appears to be stable to slight worse in the last month.     PLAN:  -- continue Acalbrutinib 100 mg BID.   -- repeat labs and follow in 4 weeks.         # patchy lung opacities  -- most recent CT chest from 05/16/2021 mixed response with regardin the GGOs.  He continues to have no infectious symptoms.  Patient is we would expect after his COVID diagnosis.  He followed up with Dr. Virgia Land recently.  Findings on his CT chest are felt to represent volume overload as noted by a recent elevation in proBNP on 06/02/2021.  No pulmonary intervention was recommended by Dr. Virgia Land at this time.  --follow up CT chest 08/13/21 showed overall improved aeration. Small indeterminate micronodules at stbale and per radiology require 6-12 month follow up . We will continue to monitor cliically and repet CT chest in  In 07/2022 - or sooner if clinically indicated.    -- he will continue following with Dr Ace Gins.     #  Iron deficiency anemia-resolved.   -- He was noted to have iron deficiency in 2014 and has since received IV iron in the past and has been on oral iron 1 pill/day.   -- iron deficiency anemia/ACD in 07/2019 with hb down to 8.7 and ferritin 78, iron sat 12%  -- now s/o 1020 mg ferheme 07/2019 and early 10/2018 with hb improvement to 10.9 today.   --EGD and colonoscopy with Dr. Loreta Ave 01/2020 revealed bleeding gastric ulcer, smaller antral ulcer with old blood. .  Currently being managed with sucralfate.    # Hypogammaglobulinemia   --History level continues to be low however he has not had any more infections  since we started him on IVIG. Before we started him on IVIG he has had multiple hospitalizations for pneumonias.  --Therefore we will continue IVIG for now every 3 months as he is tolerated this well.  --received IVIG  05/28/2021  -  IgG ordered today - remains low.   -- Receiving IVIG today - delayed as daughter was out of town.       # Health maintenance:  --  Evusheld  Received 12/04/20   -- bivalent booster  06/01/21  -- flu shot 06/01/21  --evusheld  #2 07/01/21.       # diastolic CHF with volume overload  -- followed closely by Cardiology.recently switched to bumex.   -- diuresis has bee associated with some degree of renal dysfunction in the last 5-6 months which could be contributing to his anemia.    -- he will continue following closely with Cardiology and is going to talk with cardiology about consider a nephrology consult.           DISPO:  RTC in 1 month for MD visit     .     Elyse Hsu, MD  [facility]        CC:    [place], [place] Caleb Popp, MD

## 2021-09-30 NOTE — Unmapped (Signed)
[facility] Refill Coordination Note    Specialty Medication(s) to be Shipped:   Hematology/Oncology: Calquence 100mg     Other medication(s) to be shipped: No additional medications requested for fill at this time     Christian Abbott, DOB: Oct 04, 1934  Phone: There are no phone numbers on file.      All above HIPAA information was verified with patient.     Was a Nurse, learning disability used for this call? No    Completed refill call assessment today to schedule patient's medication shipment from the [facility] [phone]).  All relevant notes have been reviewed.     Specialty medication(s) and dose(s) confirmed: Regimen is correct and unchanged.   Changes to medications: Dail reports no changes at this time.  Changes to insurance: No  New side effects reported not previously addressed with a pharmacist or physician: None reported  Questions for the pharmacist: No    Confirmed patient received a Conservation officer, historic buildings and a Surveyor, mining with first shipment. The patient will receive a drug information handout for each medication shipped and additional FDA Medication Guides as required.       DISEASE/MEDICATION-SPECIFIC INFORMATION        N/A    SPECIALTY MEDICATION ADHERENCE     Medication Adherence    Patient reported X missed doses in the last month: 0  Specialty Medication: Calquence 100mg   Patient is on additional specialty medications: No  Informant: patient              Were doses missed due to medication being on hold? No    Calquence 100 mg: 12 days of medicine on hand     REFERRAL TO PHARMACIST     Referral to the pharmacist: Not needed      [facility]     Shipping address confirmed in Epic.     Delivery Scheduled: Yes, Expected medication delivery date: 10/07/21.     Medication will be delivered via Next Day Courier to the prescription address in Epic WAM.    [name]   [facility] Specialty Technician

## 2021-10-06 MED FILL — CALQUENCE (ACALABRUTINIB MALEATE) 100 MG TABLET: ORAL | 30 days supply | Qty: 60 | Fill #2

## 2021-10-20 DIAGNOSIS — C9110 Chronic lymphocytic leukemia of B-cell type not having achieved remission: Principal | ICD-10-CM

## 2021-10-22 ENCOUNTER — Ambulatory Visit: Admit: 2021-10-22 | Discharge: 2021-10-22 | Payer: MEDICARE

## 2021-10-22 DIAGNOSIS — C9110 Chronic lymphocytic leukemia of B-cell type not having achieved remission: Principal | ICD-10-CM

## 2021-10-22 LAB — COMPREHENSIVE METABOLIC PANEL
ALBUMIN: 3.5 g/dL (ref 3.5–5.0)
ALKALINE PHOSPHATASE: 118 U/L — ABNORMAL HIGH (ref 46–116)
ALT (SGPT): 19 U/L (ref 12–78)
ANION GAP: 7 mmol/L (ref 3–11)
AST (SGOT): 17 U/L (ref 15–40)
BILIRUBIN TOTAL: 0.3 mg/dL (ref 0.2–1.0)
BLOOD UREA NITROGEN: 44 mg/dL — ABNORMAL HIGH (ref 8–20)
BUN / CREAT RATIO: 24
CALCIUM: 8.6 mg/dL (ref 8.5–10.1)
CHLORIDE: 105 mmol/L (ref 98–107)
CO2: 30.2 mmol/L (ref 21.0–32.0)
CREATININE: 1.84 mg/dL — ABNORMAL HIGH (ref 0.80–1.30)
EGFR CKD-EPI (2021) MALE: 36 mL/min/{1.73_m2} — ABNORMAL LOW (ref >=60)
GLUCOSE RANDOM: 198 mg/dL — ABNORMAL HIGH (ref 70–179)
POTASSIUM: 3.7 mmol/L (ref 3.5–5.0)
PROTEIN TOTAL: 6.1 g/dL (ref 6.0–8.0)
SODIUM: 142 mmol/L (ref 135–145)

## 2021-10-22 LAB — CBC W/ AUTO DIFF
BASOPHILS ABSOLUTE COUNT: 0 10*9/L (ref 0.0–0.1)
BASOPHILS RELATIVE PERCENT: 0.5 %
EOSINOPHILS ABSOLUTE COUNT: 0.1 10*9/L (ref 0.0–0.5)
EOSINOPHILS RELATIVE PERCENT: 0.6 %
HEMATOCRIT: 31.6 % — ABNORMAL LOW (ref 39.0–48.0)
HEMOGLOBIN: 10.1 g/dL — ABNORMAL LOW (ref 12.9–16.5)
LYMPHOCYTES ABSOLUTE COUNT: 3.8 10*9/L — ABNORMAL HIGH (ref 1.1–3.6)
LYMPHOCYTES RELATIVE PERCENT: 46 %
MEAN CORPUSCULAR HEMOGLOBIN CONC: 31.8 g/dL — ABNORMAL LOW (ref 32.0–36.0)
MEAN CORPUSCULAR HEMOGLOBIN: 26.2 pg (ref 25.9–32.4)
MEAN CORPUSCULAR VOLUME: 82.3 fL (ref 77.6–95.7)
MEAN PLATELET VOLUME: 7.8 fL (ref 6.8–10.7)
MONOCYTES ABSOLUTE COUNT: 0.6 10*9/L (ref 0.3–0.8)
MONOCYTES RELATIVE PERCENT: 6.7 %
NEUTROPHILS ABSOLUTE COUNT: 3.8 10*9/L (ref 1.8–7.8)
NEUTROPHILS RELATIVE PERCENT: 46.2 %
PLATELET COUNT: 130 10*9/L — ABNORMAL LOW (ref 150–450)
RED BLOOD CELL COUNT: 3.84 10*12/L — ABNORMAL LOW (ref 4.26–5.60)
RED CELL DISTRIBUTION WIDTH: 15 % (ref 12.2–15.2)
WBC ADJUSTED: 8.2 10*9/L (ref 3.6–11.2)

## 2021-10-22 NOTE — Unmapped (Signed)
Latest Reference Range & Units 10/22/21 13:06   WBC 3.6 - 11.2 10*9/L 8.2   RBC 4.26 - 5.60 10*12/L 3.84 (L)   HGB 12.9 - 16.5 g/dL 60.4 (L)   HCT 54.0 - 98.1 % 31.6 (L)   MCV 77.6 - 95.7 fL 82.3   MCH 25.9 - 32.4 pg 26.2   MCHC 32.0 - 36.0 g/dL 19.1 (L)   RDW 47.8 - 29.5 % 15.0   MPV 6.8 - 10.7 fL 7.8   Platelet 150 - 450 10*9/L 130 (L)   Neutrophils % % 46.2   Lymphocytes % % 46.0   Monocytes % % 6.7   Eosinophils % % 0.6   Basophils % % 0.5   Absolute Neutrophils 1.8 - 7.8 10*9/L 3.8   Absolute Lymphocytes 1.1 - 3.6 10*9/L 3.8 (H)   Absolute Monocytes  0.3 - 0.8 10*9/L 0.6   Absolute Eosinophils 0.0 - 0.5 10*9/L 0.1   Absolute Basophils  0.0 - 0.1 10*9/L 0.0   Sodium 135 - 145 mmol/L 142   Potassium 3.5 - 5.0 mmol/L 3.7   Chloride 98 - 107 mmol/L 105   CO2 21.0 - 32.0 mmol/L 30.2   Bun 8 - 20 mg/dL 44 (H)   Creatinine 6.21 - 1.30 mg/dL 3.08 (H)   BUN/Creatinine Ratio  24   eGFR CKD-EPI (2021) Male >=60 mL/min/1.54m2 36 (L)   Anion Gap 3 - 11 mmol/L 7   Glucose 70 - 179 mg/dL 657 (H)   Calcium 8.5 - 10.1 mg/dL 8.6   Albumin 3.5 - 5.0 g/dL 3.5   Total Protein 6.0 - 8.0 g/dL 6.1   Total Bilirubin 0.2 - 1.0 mg/dL 0.3   AST 15 - 40 U/L 17   ALT 12 - 78 U/L 19   Alkaline Phosphatase 46 - 116 U/L 118 (H)   (L): Data is abnormally low  (H): Data is abnormally high

## 2021-10-22 NOTE — Unmapped (Signed)
HEMATOLOGY ONCOLOGY CLINIC:  INTERVAL FOLLOW UP  NOTE    REFERRING PHYSICIAN:  Elyse Hsu, MD  PCP:  Mliss Fritz, MD    DATE OF ENCOUNTER:  10/22/2021    INTERVAL HISTORY:  Christian Abbott is an 87 y.o. male who comes today for continued follow-up of chronic lymphocytic leukemia.  Patient was admitted at [facility] for COVID-19 pneumonia from 10/06/2020-10/10/2020.  This was after he presented with fevers and shortness of breath he was treated with Decadron and remdesivir and a CT of his chest was consistent with multifocal pneumonia and showed significant mediastinal adenopathy.      Repeat CT chest without IV contrast on 10/16/2020 performed by Dr. Ace Gins revealed improving COVID-19 pneumonia and prominent mediastinal and hilar nodes which appear to be decreased from the prior exam and on 10/06/2020.      Given his advanced disease he was started on acalbrutinib 12/19/20.       On 02/13/21 he had a follow up CT chest with contrast which noted a mixed response in the pulmonary infiltrates with improvemen tin the RUL and left sided infiltrates but worsening intermittently the LLL infiltrates. However the adenopathy in the chest and a abdomen have improved.      He followed up with his cardiologist due to increasing shortness of breath in early October 2022.  On 06/02/2021 chest x-ray showed similar enlargement of the cardio pericardial silhouette and prominence of the central pulmonary vasculature which was suggestive of possible pulmonary vascular congestion.  A BNP level at that time was elevated 658. He was started on lasix and  Dose is actively being managed by cardiology.     On 08/13/21 he had a follow up CT chest which showed improved aeration and stable to improved mediastinal LAD.     He is following closely with cardiology to manage his fluid status with lasix while trying to avoid prerenal renal injury frm volume depletion.  He is now on bumex.     Reports fatigue hs been gradually worsening. No B symptoms. Accompanied today by his very supportive daughter.  Reports that he has been getting slower at walking and not able to walk for long distances without stopping to rest.         ONCOLOGY HISTORY  Oncology History Overview Note   87 y/o male with long-standing history of chronic lymphocytic leukemia initially diagnosed in 1998  Status post Rituxan based therapy for 8 weeks during that time  History of anemia treated with Procrit off and on  History of hypertension diabetes asthma hypercholesterolemia.  History of hospitalization in January 2014 for bilateral pneumonia [facility] for 3 weeks.     CLL (chronic lymphocytic leukemia) (CMS-HCC)   07/18/1997 Initial Diagnosis    CLL (chronic lymphocytic leukemia)           PAST MEDICAL HISTORY:     Patient Active Problem List   Diagnosis   ??? CLL (chronic lymphocytic leukemia) (CMS-HCC)   ??? Iron deficiency anemia   ??? Hypogammaglobulinemia (CMS-HCC)   ??? Anemia   ??? Need for prophylactic immunotherapy      has no past surgical history on file.    ALLERGIES:  has No Known Allergies.      MEDICATIONS: has a current medication list which includes the following prescription(s): acalabrutinib, acalabrutinib, accu-chek aviva plus test strp, accu-chek guide me glucose mtr, accu-chek softclix lancets, albuterol 2.5 mg /3 mL (0.083 %) Nebu 3 mL, albuterol 5 mg/mL Nebu 0.5 mL, albuterol, amlodipine,  amlodipine-atorvastatin, atorvastatin, atorvastatin, bd alcohol swabs, bd ultra-fine short pen needle, bumetanide, chlorhexidine, cholecalciferol (vitamin d3-125 mcg (5,000 unit)), clopidogrel, co-enzyme q-10, co q-10, cyanocobalamin (vitamin b-12), dexamethasone, doxazosin, doxycycline, empagliflozin, ergocalciferol-1,250 mcg (50,000 unit), famotidine, ferrous sulfate, furosemide, furosemide, glucosamine/chondr su a sod, hydralazine, hydralazine, ipratropium-albuterol, jentadueto, victoza 2-pak, liraglutide, losartan, losartan, magnesium oxide, metoprolol su-hydrochlorothiaz, metoprolol succinate, mometasone-formoterol, omeprazole, sitagliptin-metformin, spironolactone, spironolactone, sucralfate, true metrix level 2, victoza 3-pak, bioflex, zafirlukast, and metformin, and the following Facility-Administered Medications: ferric carboxymaltose 750 mg in sodium chloride (NS) 0.9 % 250 mL IVPB.    FAMILY HISTORY / SOCIAL HISTORY:  Reviewed and updated as appropriate.    REVIEW OF SYSTEMS:  As per interval history.  All other systems reviewed and negative.    PHYSICAL EXAM    Vitals: BP 150/63  - Pulse 65  - Temp 36.7 ??C (98 ??F) (Temporal)  - Resp 14  - Ht 165.1 cm (5' 5)  - Wt 67.6 kg (149 lb 0.5 oz)  - SpO2 99%  - BMI 24.80 kg/m??   General:  Pleasant, no acute distress.    Pain:  Pain Evaluation:                                   Pain Score (0 - 1):                                Pain Location:                                       Eyes:  Appears normal.   HENT:  Atraumatic.   Neck:  thyroid midline.  Lymphatics: Bilateral small cervical lymph nodes.  No supraclavicular axillary or inguinal lymphadenopathy noted   Cardiovascular:   RRR, trace edema b/l ankles.   Respiratory:  Unlabored.  Good air entry b/l. No wheezing.  CTABL   Gastrointestinal: Does not appear distended.  Nontender. No palpable HSM.   Skin:  No rashes or subcutaneous nodules.    Musculoskeletal:  Gait is steady     Psychiatric:  Affect appropriate. Judgment and insight nl.  Neurologic:  Alert and oriented x3. Grossly non-focal.              RECENT LABS/PATHOLOGY:  Results for orders placed or performed in visit on 10/22/21   Comprehensive Metabolic Panel   Result Value Ref Range    Sodium 142 135 - 145 mmol/L    Potassium 3.7 3.5 - 5.0 mmol/L    Chloride 105 98 - 107 mmol/L    CO2 30.2 21.0 - 32.0 mmol/L    Anion Gap 7 3 - 11 mmol/L    BUN 44 (H) 8 - 20 mg/dL    Creatinine 1.61 (H) 0.80 - 1.30 mg/dL    BUN/Creatinine Ratio 24     eGFR CKD-EPI (2021) Male 36 (L) >=60 mL/min/1.32m2    Glucose 198 (H) 70 - 179 mg/dL    Calcium 8.6 8.5 - 09.6 mg/dL    Albumin 3.5 3.5 - 5.0 g/dL    Total Protein 6.1 6.0 - 8.0 g/dL    Total Bilirubin 0.3 0.2 - 1.0 mg/dL    AST 17 15 - 40 U/L    ALT 19 12 - 78 U/L    Alkaline Phosphatase 118 (H)  46 - 116 U/L   CBC w/ Differential   Result Value Ref Range    WBC 8.2 3.6 - 11.2 10*9/L    RBC 3.84 (L) 4.26 - 5.60 10*12/L    HGB 10.1 (L) 12.9 - 16.5 g/dL    HCT 16.1 (L) 09.6 - 48.0 %    MCV 82.3 77.6 - 95.7 fL    MCH 26.2 25.9 - 32.4 pg    MCHC 31.8 (L) 32.0 - 36.0 g/dL    RDW 04.5 40.9 - 81.1 %    MPV 7.8 6.8 - 10.7 fL    Platelet 130 (L) 150 - 450 10*9/L    Neutrophils % 46.2 %    Lymphocytes % 46.0 %    Monocytes % 6.7 %    Eosinophils % 0.6 %    Basophils % 0.5 %    Absolute Neutrophils 3.8 1.8 - 7.8 10*9/L    Absolute Lymphocytes 3.8 (H) 1.1 - 3.6 10*9/L    Absolute Monocytes 0.6 0.3 - 0.8 10*9/L    Absolute Eosinophils 0.1 0.0 - 0.5 10*9/L    Absolute Basophils 0.0 0.0 - 0.1 10*9/L     RADIOLOGY    11/14/2020  PET CT   Patchy consolidative opacities within the lungs, increased from chest CT 10/16/2020, particularly in the lingula with areas FDG avid focus.   This is likely infectious/inflammatory.   Recommend follow-up chest CT in 3 months.  Multiple enlarged lymph nodes in the neck, chest, abdomen and pelvis without significant FDG avidity.   Splenomegaly, without significant FDG avidity      02/13/21 CT chest:  1.  Mixed response of pulmonary infiltrates to interval therapy as detailed above.  The hypermetabolic apicoposterior infiltrate seen previously in the RIGHT upper lobe has largely resolved as have the nodular infiltrates seen previously in the inferior lingula while hypermetabolic infiltrate seen previously in the perihilar and peripheral RIGHT lower lobe is worse.  Follow-up CT the chest is recommended in 3 months.     2.  Interval improvement in mediastinal, bilateral hilar, gastrohepatic ligament, and splenic hilar adenopathy.  The spleen is also slightly smaller.     3. Stable probably benign 1.7 cm LEFT adrenal mass.  Attention to this on follow-up imaging is indicated.     4.  Moderate to advanced atherosclerosis without aneurysm or dissection.  Multivessel coronary artery disease.  Chronic occlusion of the celiac artery.      05/14/21 CT Chest   1.  Fluctuating pulmonary opacities compared to the prior exams.  Compared to June 2022, these are improved in the perihilar right upper and right lower lobe as well as the dependent left lung base.  They are mildly worse in the right middle lobe, anterolateral right lower lobe,, lingula, and left lower lobe superior segment.  2.  0.4 cm right upper lobe nodule is new compared to June 2022.  Additional discrete small lung nodules are unchanged since February 2022.  Continued attention on follow-up.  3.  Mediastinal, hilar, and gastrohepatic lymphadenopathy is stable to minimally decreased compared to June 2022.  Spleen size is stable.    08/13/21 CT chest   IMPRESSION:  1. Overall improved aeration in the bilateral lungs, suggesting improved infectious or inflammatory pneumonitis since prior examination. Persistent linear and subpleural reticular opacities bilaterally, suggestive of residual disease.  2. Scattered micronodules bilaterally, unchanged since recent prior examinations. No new, or enlarging nodules are noted. Recommend follow-up examination in 6-12 months based on Fleischner Society criteria.  3. Mediastinal and hilar lymph nodes appear relatively unchanged to slightly improved in the interval. No new or enlarging adenopathy.    ASSESSMENT and PLAN:    # Chronic lymphocytic leukemia  --Rai stage IV CLL  --CLL FISH panel shows 13 q. deletion 21.5%, IGHV mutated -good prognostic features  --11/05/2020: T p53 mutation negative.  Notch 1 -.  SF 3 B1 positive.  SF 3 B1 positivity indicates poor prognosis.  -- 07/2019 DAT, haptoglobin, LDH normal-no hemolysis  --Indication for treatment advanced stage and history of thrombocytopenia .  -- He has been treated with Rituxan many years ago by Dr Thedore Mins, with good response in the 1990s. Treatment ndication at that time may have been frequent infections.  --I reviewed his recent PET scan.  The mediastinal hilar intra-abdominal lymph nodes are without significant avidity thereby unlikely to have Richter's transformation.  Some of the hilar mediastinal adenopathy may be also reactive to his recent COVID-19 pneumonia.  The patchy opacities within the lung appear to be slightly worse compared to 10/16/2020  -- repeat CT chest 02/13/21 noted above. Overall improvement in infiltrates except for LLL. improvemen tin LAD may be c/w response to Acalbrutinib vs improvement in reactive ALD from prior COVID.   -- started acabrutinib 12/19/20  --  Reviewed cbc CMP and LDH today.   ALC continue to gradually decline. No e/o  POD.   --  chronic fatigue appears to be stable to slight worse in the last month.     PLAN:  -- continue Acalbrutinib 100 mg BID.   -- repeat labs and follow in 4 weeks.         # patchy lung opacities  -- most recent CT chest from 05/16/2021 mixed response with regardin the GGOs.  He continues to have no infectious symptoms.  Patient is we would expect after his COVID diagnosis.  He followed up with Dr. Virgia Land recently.  Findings on his CT chest are felt to represent volume overload as noted by a recent elevation in proBNP on 06/02/2021.  No pulmonary intervention was recommended by Dr. Virgia Land at this time.  --follow up CT chest 08/13/21 showed overall improved aeration. Small indeterminate micronodules at stbale and per radiology require 6-12 month follow up . We will continue to monitor cliically and repet CT chest in  In 07/2022 - or sooner if clinically indicated.    -- he will continue following with Dr Ace Gins.     #  Iron deficiency anemia-resolved.   -- He was noted to have iron deficiency in 2014 and has since received IV iron in the past and has been on oral iron 1 pill/day.   -- iron deficiency anemia/ACD in 07/2019 with hb down to 8.7 and ferritin 78, iron sat 12%  -- now s/o 1020 mg ferheme 07/2019 and early 10/2018 with hb improvement to 10.9 today.   --EGD and colonoscopy with Dr. Loreta Ave 01/2020 revealed bleeding gastric ulcer, smaller antral ulcer with old blood. .  Currently being managed with sucralfate.    # Hypogammaglobulinemia   --History level continues to be low however he has not had any more infections since we started him on IVIG. Before we started him on IVIG he has had multiple hospitalizations for pneumonias.  --Therefore we will continue IVIG for now every 3 months as he is tolerated this well.  --received IVIG  In 08/2021   -- Receiving IVIG in 2 months .       # Health maintenance:  --  Evusheld  Received 12/04/20   -- bivalent booster  06/01/21  -- flu shot 06/01/21  --evusheld  #2 07/01/21.       # diastolic CHF with volume overload  -- followed closely by Cardiology.recently switched to bumex.   -- diuresis has bee associated with some degree of renal dysfunction in the last 5-6 months which could be contributing to his anemia.    -- he will continue following closely with Cardiology and is going to talk with cardiology about consider a nephrology consult.           DISPO:  RTC in 1 month for MD visit     .     Elyse Hsu, MD  [facility]        CC:    Elyse Hsu, MD   Mliss Fritz, MD

## 2021-10-29 NOTE — Unmapped (Signed)
[facility] Clinical Assessment & Refill Coordination Note    Christian Abbott, DOB: 1934/11/29  Phone: There are no phone numbers on file.    All above HIPAA information was verified with patient.     Was a Nurse, learning disability used for this call? No    Specialty Medication(s):   Hematology/Oncology: Calquence     Current Outpatient Medications   Medication Sig Dispense Refill   ??? acalabrutinib (CALQUENCE) 100 mg capsule Take 1 capsule (100 mg total) by mouth Two (2) times a day. Do not break, open, or chew capsules. 60 capsule 5   ??? acalabrutinib (CALQUENCE) 100 mg tablet Take 1 tablet (100 mg total) by mouth Two (2) times a day. Swallow whole with water. Do not chew, crush, dissolve, or cut tablets. 60 tablet 5   ??? ACCU-CHEK AVIVA PLUS TEST STRP Strp      ??? ACCU-CHEK GUIDE ME GLUCOSE MTR Misc      ??? ACCU-CHEK SOFTCLIX LANCETS lancets      ??? albuterol 2.5 mg /3 mL (0.083 %) Nebu 3 mL, albuterol 5 mg/mL Nebu 0.5 mL Inhale 2.5 mg.     ??? albuterol 2.5 mg/0.5 mL nebulizer solution Inhale 2.5 mg.     ??? amLODIPine (NORVASC) 10 MG tablet Take 10 mg by mouth daily.     ??? amLODIPine-atorvastatin (CADUET) 10-10 mg per tablet      ??? atorvastatin (LIPITOR) 80 MG tablet Take 80 mg by mouth.     ??? atorvastatin (LIPITOR) 80 MG tablet      ??? BD ALCOHOL SWABS PadM      ??? BD ULTRA-FINE SHORT PEN NEEDLE 31 gauge x 5/16 (8 mm) Ndle AS DIRECTED ONCE DAILY 100 DAYS     ??? bumetanide (BUMEX) 1 MG tablet Take 1 tablet by mouth daily.     ??? chlorhexidine (PERIDEX) 0.12 % solution RINSE 15 ML S ONCE DAILY AFTER BREAKFAST FOLLOWING BRUSHING AND FLOSSING     ??? cholecalciferol, vitamin D3, 5,000 unit Tab Take 1 tablet by mouth.     ??? clopidogrel (PLAVIX) 75 mg tablet Take 75 mg by mouth.     ??? co-enzyme Q-10 30 mg capsule Take 30 mg by mouth Three (3) times a day.     ??? coenzyme Q10 (CO Q-10) 300 mg cap      ??? cyanocobalamin, vitamin B-12, 1,000 mcg TbER Take 1 tablet by mouth.     ??? dexAMETHasone (DECADRON) 6 MG tablet      ??? doxazosin (CARDURA) 4 MG tablet Take 4 mg by mouth nightly.  1   ??? doxycycline (VIBRAMYCIN) 100 MG capsule      ??? empagliflozin (JARDIANCE) 10 mg tablet Take 1 tablet by mouth daily.     ??? ergocalciferol-1,250 mcg, 50,000 unit, (DRISDOL) 1,250 mcg (50,000 unit) capsule TAKE 1 CAPSULE BY MOUTH ONE TIME PER WEEK     ??? famotidine (PEPCID) 40 MG tablet Take 40 mg by mouth.     ??? ferrous sulfate 15 mg iron/1.5 mL Susp Take 1 tablet by mouth.     ??? furosemide (LASIX) 20 MG tablet Take 40 mg by mouth.     ??? furosemide (LASIX) 40 MG tablet Monday & Wednesday     ??? glucosamine/chondr su A sod (OSTEO BI-FLEX ORAL) Take by mouth.     ??? hydrALAZINE (APRESOLINE) 50 MG tablet Take 50 mg by mouth Three (3) times a day.      ??? hydrALAZINE (APRESOLINE) 50 MG tablet      ???  ipratropium-albuterol (DUO-NEB) 0.5-2.5 mg/3 mL nebulizer GIVE 3 ML TWICE DAILY INHALATION 90 DAYS  3   ??? JENTADUETO 2.5-500 mg Tab      ??? liraglutide (VICTOZA 2-PAK) 0.6 mg/0.1 mL (18 mg/3 mL) injection      ??? LIRAGLUTIDE SUBQ Inject 1.8 mg under the skin.     ??? losartan (COZAAR) 50 MG tablet Take 50 mg by mouth daily.  0   ??? losartan (COZAAR) 50 MG tablet      ??? magnesium oxide (MAG-OX) 400 mg (241.3 mg elemental magnesium) tablet Take 1 tablet by mouth daily.     ??? metFORMIN (GLUCOPHAGE-XR) 500 MG 24 hr tablet  (Patient not taking: Reported on 10/22/2021)     ??? metoprolol su-hydrochlorothiaz 50-12.5 mg Tb24      ??? metoprolol succinate (TOPROL-XL) 50 MG 24 hr tablet Take 50 mg by mouth.     ??? mometasone-formoterol 200-5 mcg/actuation HFAA Inhale 2 puffs two (2) times a day.     ??? omeprazole (PRILOSEC) 40 MG capsule 1 cap(s) orally once a day for 90 days     ??? sitaGLIPtin-metFORMIN (JANUMET) 50-1,000 mg per tablet Take 2 tablets by mouth daily at 0600.     ??? spironolactone (ALDACTONE) 25 MG tablet Take 1 tablet by mouth every other day.     ??? spironolactone (ALDACTONE) 25 MG tablet      ??? SUCRALFATE ORAL Take 1 g by mouth.     ??? TRUE METRIX LEVEL 2 Soln      ??? VICTOZA 3-PAK 0.6 mg/0.1 mL (18 mg/3 mL) injection 1.8 mg.  2   ??? vit C-bioflav-hesp-rutin-hb196 (BIOFLEX) 500-50-25-40 mg Tab Take by mouth.     ??? zafirlukast (ACCOLATE) 20 MG tablet Take 20 mg by mouth.       No current facility-administered medications for this visit.     Facility-Administered Medications Ordered in Other Visits   Medication Dose Route Frequency Provider Last Rate Last Admin   ??? ferric carboxymaltose 750 mg in sodium chloride (NS) 0.9 % 250 mL IVPB  750 mg Intravenous Once Domingo Dimes, MD            Changes to medications: Christian Abbott reports no changes at this time.    No Known Allergies    Changes to allergies: No    SPECIALTY MEDICATION ADHERENCE     Calquence 100 mg: 10 days of medicine on hand     Medication Adherence    Patient reported X missed doses in the last month: 0  Specialty Medication: Calquence 100mg           Specialty medication(s) dose(s) confirmed: Regimen is correct and unchanged.     Are there any concerns with adherence? No    Adherence counseling provided? Not needed    CLINICAL MANAGEMENT AND INTERVENTION      Clinical Benefit Assessment:    Do you feel the medicine is effective or helping your condition? Yes    Clinical Benefit counseling provided? Not needed    Adverse Effects Assessment:    Are you experiencing any side effects? No    Are you experiencing difficulty administering your medicine? No    Quality of Life Assessment:    Quality of Life      Oncology  1. What impact has your specialty medication had on the reduction of your daily pain or discomfort level?: None  2. On a scale of 1-10, how would you rate your ability to manage side effects associated with your specialty medication? (  1=no issues, 10 = unable to take medication due to side effects): 1            How many days over the past month did your condition/medication  keep you from your normal activities? For example, brushing your teeth or getting up in the morning. 0    Have you discussed this with your provider? Not needed    Acute Infection Status:    Acute infections noted within Epic:  No active infections  Patient reported infection: None    Therapy Appropriateness:    Is therapy appropriate and patient progressing towards therapeutic goals? Yes, therapy is appropriate and should be continued    DISEASE/MEDICATION-SPECIFIC INFORMATION      N/A    PATIENT SPECIFIC NEEDS     - Does the patient have any physical, cognitive, or cultural barriers? No    - Is the patient high risk? Yes, patient is taking oral chemotherapy. Appropriateness of therapy as been assessed    - Does the patient require a Care Management Plan? No         SHIPPING     Specialty Medication(s) to be Shipped:   Hematology/Oncology: Calquence    Other medication(s) to be shipped: No additional medications requested for fill at this time     Changes to insurance: No    Delivery Scheduled: Yes, Expected medication delivery date: 11/05/21.     Medication will be delivered via Next Day Courier to the confirmed prescription address in [facility].    The patient will receive a drug information handout for each medication shipped and additional FDA Medication Guides as required.  Verified that patient has previously received a Conservation officer, historic buildings and a Surveyor, mining.    The patient or caregiver noted above participated in the development of this care plan and knows that they can request review of or adjustments to the care plan at any time.      All of the patient's questions and concerns have been addressed.    Christian Abbott   [facility] Specialty Pharmacist

## 2021-10-31 MED ORDER — SUCRALFATE 100 MG/ML ORAL SUSPENSION
Freq: Two times a day (BID) | ORAL | 5 refills | 0 days
Start: 2021-10-31 — End: ?

## 2021-11-02 MED ORDER — SUCRALFATE 100 MG/ML ORAL SUSPENSION
Freq: Two times a day (BID) | ORAL | 5 refills | 21 days | Status: CP
Start: 2021-11-02 — End: 2021-12-02

## 2021-11-02 NOTE — Unmapped (Signed)
Received Carafate refill request but no mention in your note if still on from 10/22/21.    Please refill if appropriate.

## 2021-11-13 DIAGNOSIS — C9110 Chronic lymphocytic leukemia of B-cell type not having achieved remission: Principal | ICD-10-CM

## 2021-11-13 MED FILL — CALQUENCE (ACALABRUTINIB MALEATE) 100 MG TABLET: ORAL | 30 days supply | Qty: 60 | Fill #3

## 2021-11-13 NOTE — Unmapped (Signed)
[facility] Refill Coordination Note    Specialty Medication(s) to be Shipped:   Hematology/Oncology: Calquence    Other medication(s) to be shipped: No additional medications requested for fill at this time     Christian Abbott, DOB: 31-Jul-1935  Phone: There are no phone numbers on file.      All above HIPAA information was verified with patient's family member, Daughter  - Christian Abbott  .     Was a Nurse, learning disability used for this call? No    Completed refill call assessment today to schedule patient's medication shipment from the [facility] [phone]).  All relevant notes have been reviewed.     Specialty medication(s) and dose(s) confirmed: Regimen is correct and unchanged.   Changes to medications: Christian Abbott reports starting the following medications: Jardiance  25mg   - take 1  cap PO  daily  ,  Glipizide  5mg  -  take  1 tab PO  daily,    Changes to insurance: No  New side effects reported not previously addressed with a pharmacist or physician: None reported  Questions for the pharmacist: No    Confirmed patient received a Conservation officer, historic buildings and a Surveyor, mining with first shipment. The patient will receive a drug information handout for each medication shipped and additional FDA Medication Guides as required.       DISEASE/MEDICATION-SPECIFIC INFORMATION        N/A    SPECIALTY MEDICATION ADHERENCE     Medication Adherence    Patient reported X missed doses in the last month: 0  Specialty Medication: CALQUENCE 100 mg  Patient is on additional specialty medications: No  Patient is on more than two specialty medications: No  Any gaps in refill history greater than 2 weeks in the last 3 months: no  Demonstrates understanding of importance of adherence: yes              Were doses missed due to medication being on hold? No     CALQUENCE 100 mg -2 days of medicine on hand        REFERRAL TO PHARMACIST     Referral to the pharmacist: Not needed      [facility]     Shipping address confirmed in Epic.     Delivery Scheduled: Yes, Expected medication delivery date: 11/14/21.     Medication will be delivered via Next Day Courier to the prescription address in Epic [facility].    Christian Abbott   [facility] Specialty Technician

## 2021-11-16 ENCOUNTER — Ambulatory Visit: Admit: 2021-11-16 | Discharge: 2021-11-16 | Payer: MEDICARE

## 2021-11-16 DIAGNOSIS — C9110 Chronic lymphocytic leukemia of B-cell type not having achieved remission: Principal | ICD-10-CM

## 2021-11-16 LAB — COMPREHENSIVE METABOLIC PANEL
ALBUMIN: 3.3 g/dL — ABNORMAL LOW (ref 3.5–5.0)
ALKALINE PHOSPHATASE: 128 U/L — ABNORMAL HIGH (ref 46–116)
ALT (SGPT): 19 U/L (ref 12–78)
ANION GAP: 6 mmol/L (ref 3–11)
AST (SGOT): 15 U/L (ref 15–40)
BILIRUBIN TOTAL: 0.3 mg/dL (ref 0.2–1.0)
BLOOD UREA NITROGEN: 39 mg/dL — ABNORMAL HIGH (ref 8–20)
BUN / CREAT RATIO: 22
CALCIUM: 8.7 mg/dL (ref 8.5–10.1)
CHLORIDE: 103 mmol/L (ref 98–107)
CO2: 31.5 mmol/L (ref 21.0–32.0)
CREATININE: 1.79 mg/dL — ABNORMAL HIGH (ref 0.80–1.30)
EGFR CKD-EPI (2021) MALE: 37 mL/min/{1.73_m2} — ABNORMAL LOW (ref >=60)
GLUCOSE RANDOM: 262 mg/dL — ABNORMAL HIGH (ref 70–179)
POTASSIUM: 4.7 mmol/L (ref 3.5–5.0)
PROTEIN TOTAL: 5.9 g/dL — ABNORMAL LOW (ref 6.0–8.0)
SODIUM: 140 mmol/L (ref 135–145)

## 2021-11-16 LAB — CBC W/ AUTO DIFF
BASOPHILS ABSOLUTE COUNT: 0 10*9/L (ref 0.0–0.1)
BASOPHILS RELATIVE PERCENT: 0.3 %
EOSINOPHILS ABSOLUTE COUNT: 0.1 10*9/L (ref 0.0–0.5)
EOSINOPHILS RELATIVE PERCENT: 0.8 %
HEMATOCRIT: 29.5 % — ABNORMAL LOW (ref 39.0–48.0)
HEMOGLOBIN: 9.4 g/dL — ABNORMAL LOW (ref 12.9–16.5)
LYMPHOCYTES ABSOLUTE COUNT: 4.4 10*9/L — ABNORMAL HIGH (ref 1.1–3.6)
LYMPHOCYTES RELATIVE PERCENT: 47.9 %
MEAN CORPUSCULAR HEMOGLOBIN CONC: 31.8 g/dL — ABNORMAL LOW (ref 32.0–36.0)
MEAN CORPUSCULAR HEMOGLOBIN: 25.6 pg — ABNORMAL LOW (ref 25.9–32.4)
MEAN CORPUSCULAR VOLUME: 80.5 fL (ref 77.6–95.7)
MEAN PLATELET VOLUME: 8 fL (ref 6.8–10.7)
MONOCYTES ABSOLUTE COUNT: 0.6 10*9/L (ref 0.3–0.8)
MONOCYTES RELATIVE PERCENT: 6 %
NEUTROPHILS ABSOLUTE COUNT: 4.1 10*9/L (ref 1.8–7.8)
NEUTROPHILS RELATIVE PERCENT: 45 %
PLATELET COUNT: 133 10*9/L — ABNORMAL LOW (ref 150–450)
RED BLOOD CELL COUNT: 3.67 10*12/L — ABNORMAL LOW (ref 4.26–5.60)
RED CELL DISTRIBUTION WIDTH: 15.5 % — ABNORMAL HIGH (ref 12.2–15.2)
WBC ADJUSTED: 9.2 10*9/L (ref 3.6–11.2)

## 2021-11-16 LAB — IRON & TIBC
IRON SATURATION: 11 % — ABNORMAL LOW (ref 20–55)
IRON: 33 ug/dL — ABNORMAL LOW (ref 50–150)
TOTAL IRON BINDING CAPACITY: 302 ug/dL (ref 250–450)

## 2021-11-16 LAB — FERRITIN: FERRITIN: 56 ng/mL (ref 10.5–307.3)

## 2021-11-17 NOTE — Unmapped (Signed)
HEMATOLOGY ONCOLOGY CLINIC:  INTERVAL FOLLOW UP  NOTE    REFERRING PHYSICIAN:  Elyse Hsu, MD  PCP:  Mliss Fritz, MD    DATE OF ENCOUNTER:  11/16/2021    INTERVAL HISTORY:  Christian Abbott is an 87 y.o. male who comes today for continued follow-up of chronic lymphocytic leukemia.     Given his advanced disease he was started on acalbrutinib 12/19/20.       On 02/13/21 he had a follow up CT chest with contrast after ihis diagnosis of COVID in 09/2020,  which noted a mixed response in the pulmonary infiltrates with improvemen tin the RUL and left sided infiltrates but worsening intermittently the LLL infiltrates. However the adenopathy in the chest and a abdomen have improved.     He followed up with his cardiologist due to increasing shortness of breath in early October 2022.  On 06/02/2021 chest x-ray showed similar enlargement of the cardio pericardial silhouette and prominence of the central pulmonary vasculature which was suggestive of possible pulmonary vascular congestion.  A BNP level at that time was elevated 658. He was started on lasix and  Dose is actively being managed by cardiology.     On 08/13/21 he had a follow up CT chest which showed improved aeration and stable to improved mediastinal LAD.    In the last month he was started on Jardiance. And reports feeling  Better. Over the last 3-4 weeks patient and daughter report less fatigue.     No B symptoms. Accompanied today by his very supportive daughter.         ONCOLOGY HISTORY  Oncology History Overview Note   87 y/o male with long-standing history of chronic lymphocytic leukemia initially diagnosed in 1998  Status post Rituxan based therapy for 8 weeks during that time  History of anemia treated with Procrit off and on  History of hypertension diabetes asthma hypercholesterolemia.  History of hospitalization in January 2014 for bilateral pneumonia [facility] for 3 weeks.     CLL (chronic lymphocytic leukemia) (CMS-HCC)   07/18/1997 Initial Diagnosis    CLL (chronic lymphocytic leukemia)           PAST MEDICAL HISTORY:     Patient Active Problem List   Diagnosis   ??? CLL (chronic lymphocytic leukemia) (CMS-HCC)   ??? Iron deficiency anemia   ??? Hypogammaglobulinemia (CMS-HCC)   ??? Anemia   ??? Need for prophylactic immunotherapy      has no past surgical history on file.    ALLERGIES:  has No Known Allergies.      MEDICATIONS: has a current medication list which includes the following prescription(s): acalabrutinib, accu-chek aviva plus test strp, accu-chek guide me glucose mtr, accu-chek softclix lancets, albuterol 2.5 mg /3 mL (0.083 %) Nebu 3 mL, albuterol 5 mg/mL Nebu 0.5 mL, albuterol, atorvastatin, atorvastatin, bd alcohol swabs, bd ultra-fine short pen needle, bumetanide, chlorhexidine, cholecalciferol (vitamin d3-125 mcg (5,000 unit)), clopidogrel, co-enzyme q-10, cyanocobalamin (vitamin b-12), doxazosin, empagliflozin, ergocalciferol-1,250 mcg (50,000 unit), famotidine, furosemide, hydralazine, hydralazine, ipratropium-albuterol, jentadueto, victoza 2-pak, liraglutide, losartan, losartan, magnesium oxide, metoprolol su-hydrochlorothiaz, metoprolol succinate, mometasone-formoterol, sitagliptin-metformin, spironolactone, spironolactone, sucralfate, sucralfate, true metrix level 2, victoza 3-pak, bioflex, acalabrutinib, amlodipine, amlodipine-atorvastatin, co q-10, dexamethasone, doxycycline, ferrous sulfate, furosemide, glucosamine/chondr su a sod, metformin, omeprazole, and zafirlukast, and the following Facility-Administered Medications: ferric carboxymaltose 750 mg in sodium chloride (NS) 0.9 % 250 mL IVPB.    FAMILY HISTORY / SOCIAL HISTORY:  Reviewed and updated as appropriate.    REVIEW  OF SYSTEMS:  As per interval history.  All other systems reviewed and negative.    PHYSICAL EXAM    Vitals: BP 121/60  - Pulse 64  - Temp 36.2 ??C (97.2 ??F) (Temporal)  - Resp 14  - Ht 165.1 cm (5' 5)  - Wt 68 kg (149 lb 14.6 oz)  - SpO2 98%  - BMI 24.95 kg/m??   General:  Pleasant, no acute distress.    Pain:  Pain Evaluation:                                   Pain Score (0 - 1):                                Pain Location:                                       Eyes:  Appears normal.   HENT:  Atraumatic.   Neck:  thyroid midline.  Lymphatics: Bilateral small cervical lymph nodes.  No supraclavicular axillary or inguinal lymphadenopathy   Cardiovascular:   RRR, trace edema b/l ankles.   Respiratory:  Unlabored.  Good air entry b/l. No wheezing.  CTABL   Gastrointestinal: Does not appear distended.  Nontender. No palpable HSM.   Skin:  No rashes or subcutaneous nodules.    Musculoskeletal:  Gait is steady     Psychiatric:  Affect appropriate. Judgment and insight nl.  Neurologic:  Alert and oriented x3. Grossly non-focal.              RECENT LABS/PATHOLOGY:  Results for orders placed or performed in visit on 11/16/21   Comprehensive Metabolic Panel   Result Value Ref Range    Sodium 140 135 - 145 mmol/L    Potassium 4.7 3.5 - 5.0 mmol/L    Chloride 103 98 - 107 mmol/L    CO2 31.5 21.0 - 32.0 mmol/L    Anion Gap 6 3 - 11 mmol/L    BUN 39 (H) 8 - 20 mg/dL    Creatinine 1.61 (H) 0.80 - 1.30 mg/dL    BUN/Creatinine Ratio 22     eGFR CKD-EPI (2021) Male 37 (L) >=60 mL/min/1.36m2    Glucose 262 (H) 70 - 179 mg/dL    Calcium 8.7 8.5 - 09.6 mg/dL    Albumin 3.3 (L) 3.5 - 5.0 g/dL    Total Protein 5.9 (L) 6.0 - 8.0 g/dL    Total Bilirubin 0.3 0.2 - 1.0 mg/dL    AST 15 15 - 40 U/L    ALT 19 12 - 78 U/L    Alkaline Phosphatase 128 (H) 46 - 116 U/L   Ferritin   Result Value Ref Range    Ferritin 56.0 10.5 - 307.3 ng/mL   Iron & TIBC   Result Value Ref Range    Iron 33 (L) 50 - 150 ug/dL    TIBC 045 409 - 811 ug/dL    Iron Saturation (%) 11 (L) 20 - 55 %   CBC w/ Differential   Result Value Ref Range    WBC 9.2 3.6 - 11.2 10*9/L    RBC 3.67 (L) 4.26 - 5.60 10*12/L    HGB 9.4 (L) 12.9 - 16.5 g/dL    HCT 91.4 (L)  39.0 - 48.0 %    MCV 80.5 77.6 - 95.7 fL    MCH 25.6 (L) 25.9 - 32.4 pg    MCHC 31.8 (L) 32.0 - 36.0 g/dL    RDW 09.8 (H) 11.9 - 15.2 %    MPV 8.0 6.8 - 10.7 fL    Platelet 133 (L) 150 - 450 10*9/L    Neutrophils % 45.0 %    Lymphocytes % 47.9 %    Monocytes % 6.0 %    Eosinophils % 0.8 %    Basophils % 0.3 %    Absolute Neutrophils 4.1 1.8 - 7.8 10*9/L    Absolute Lymphocytes 4.4 (H) 1.1 - 3.6 10*9/L    Absolute Monocytes 0.6 0.3 - 0.8 10*9/L    Absolute Eosinophils 0.1 0.0 - 0.5 10*9/L    Absolute Basophils 0.0 0.0 - 0.1 10*9/L     RADIOLOGY    11/14/2020  PET CT   Patchy consolidative opacities within the lungs, increased from chest CT 10/16/2020, particularly in the lingula with areas FDG avid focus.   This is likely infectious/inflammatory.   Recommend follow-up chest CT in 3 months.  Multiple enlarged lymph nodes in the neck, chest, abdomen and pelvis without significant FDG avidity.   Splenomegaly, without significant FDG avidity      02/13/21 CT chest:  1.  Mixed response of pulmonary infiltrates to interval therapy as detailed above.  The hypermetabolic apicoposterior infiltrate seen previously in the RIGHT upper lobe has largely resolved as have the nodular infiltrates seen previously in the inferior lingula while hypermetabolic infiltrate seen previously in the perihilar and peripheral RIGHT lower lobe is worse.  Follow-up CT the chest is recommended in 3 months.     2.  Interval improvement in mediastinal, bilateral hilar, gastrohepatic ligament, and splenic hilar adenopathy.  The spleen is also slightly smaller.     3.  Stable probably benign 1.7 cm LEFT adrenal mass.  Attention to this on follow-up imaging is indicated.     4.  Moderate to advanced atherosclerosis without aneurysm or dissection.  Multivessel coronary artery disease.  Chronic occlusion of the celiac artery.      05/14/21 CT Chest   1.  Fluctuating pulmonary opacities compared to the prior exams.  Compared to June 2022, these are improved in the perihilar right upper and right lower lobe as well as the dependent left lung base.  They are mildly worse in the right middle lobe, anterolateral right lower lobe,, lingula, and left lower lobe superior segment.  2.  0.4 cm right upper lobe nodule is new compared to June 2022.  Additional discrete small lung nodules are unchanged since February 2022.  Continued attention on follow-up.  3.  Mediastinal, hilar, and gastrohepatic lymphadenopathy is stable to minimally decreased compared to June 2022.  Spleen size is stable.    08/13/21 CT chest   IMPRESSION:  1. Overall improved aeration in the bilateral lungs, suggesting improved infectious or inflammatory pneumonitis since prior examination. Persistent linear and subpleural reticular opacities bilaterally, suggestive of residual disease.  2. Scattered micronodules bilaterally, unchanged since recent prior examinations. No new, or enlarging nodules are noted. Recommend follow-up examination in 6-12 months based on Fleischner Society criteria.  3. Mediastinal and hilar lymph nodes appear relatively unchanged to slightly improved in the interval. No new or enlarging adenopathy.    ASSESSMENT and PLAN:    # Chronic lymphocytic leukemia  --Rai stage IV CLL  --CLL FISH panel shows 13 q. deletion 21.5%,  IGHV mutated -good prognostic features  --11/05/2020: T p53 mutation negative.  NOTCH1 negative.  SF3B1 positive.  SF3B1 positivity indicates poor prognosis.  -- 07/2019 DAT, haptoglobin, LDH normal-no hemolysis  --Indication for treatment advanced stage and history of thrombocytopenia .  -- He has been treated with Rituxan many years ago by Dr Thedore Mins, with good response in the 1990s. Treatment indication at that time may have been frequent infections.  -- started acabrutinib 12/19/20  -- Reviewed cbc CMP and LDH today.  ALC stable at 4.4. No e/o  POD.   --  chronic fatigue appears to be stable to slight worse in the last month.     PLAN:  -- continue Acalbrutinib 100 mg BID.   -- repeat labs and follow in 4 weeks      # Patchy lung opacities  -- most recent CT chest from 05/16/2021 mixed response with regardin the GGOs.  He continues to have no infectious symptoms.  Patient is we would expect after his COVID diagnosis.  He followed up with Dr. Virgia Land recently.  Findings on his CT chest are felt to represent volume overload as noted by a recent elevation in proBNP on 06/02/2021.  No pulmonary intervention was recommended by Dr. Virgia Land at this time.  --follow up CT chest 08/13/21 showed overall improved aeration. Small indeterminate micronodules at stbale and per radiology require 6-12 month follow up . We will continue to monitor cliically and repet CT chest in  In 07/2022 - or sooner if clinically indicated.    -- he will continue following with Dr Ace Gins.     #  Iron deficiency anemia-resolved.   -- He was noted to have iron deficiency in 2014 and has since received IV iron in the past and has been on oral iron 1 pill/day.   -- iron deficiency anemia/ACD in 07/2019 with hb down to 8.7 and ferritin 78, iron sat 12%  -- now s/o 1020 mg ferheme 07/2019 and early 10/2018 with hb improvement to 10.9 today.   --EGD and colonoscopy with Dr. Loreta Ave 01/2020 revealed bleeding gastric ulcer, smaller antral ulcer with old blood. .  Currently being managed with sucralfate.    # Hypogammaglobulinemia   --History level continues to be low however he has not had any more infections since we started him on IVIG. Before we started him on IVIG he has had multiple hospitalizations for pneumonias.  --Therefore we will continue IVIG for now every 3 months as he is tolerated this well.  --received IVIG  In 08/2021   -- Receiving IVIG  In 1 month.        # Health maintenance:  --  Evusheld  Received 12/04/20   -- bivalent booster  06/01/21  -- flu shot 06/01/21  --evusheld  #2 07/01/21.       # diastolic CHF with volume overload  -- followed closely by Cardiology.recently switched to bumex.   -- diuresis has bee associated with some degree of renal dysfunction in the last 5-6 months which could be contributing to his anemia.    -- he will continue following closely with Cardiology and is going to talk with cardiology about consider a nephrology consult.           DISPO:  RTC in 1 month for MD visit     .     I have independently reviewed notes from prior visits, results from labs and imaging.  The current plan of care requires intensive monitoring for toxicity due to  use of antineoplastic treatments.  Medical decision making was of high complexity due to to ongoing risk of morbidity/mortality from antineoplastic treatment.      Elyse Hsu, MD  [facility]

## 2021-11-28 DIAGNOSIS — D801 Nonfamilial hypogammaglobulinemia: Principal | ICD-10-CM

## 2021-12-03 NOTE — Unmapped (Signed)
[facility] Refill Coordination Note    Specialty Medication(s) to be Shipped:   Hematology/Oncology: Calquence    Other medication(s) to be shipped: No additional medications requested for fill at this time     Christian Abbott, DOB: 03/15/1935  Phone: There are no phone numbers on file.      All above HIPAA information was verified with patient's family member, daughter.     Was a Nurse, learning disability used for this call? No    Completed refill call assessment today to schedule patient's medication shipment from the [facility] [phone]).  All relevant notes have been reviewed.     Specialty medication(s) and dose(s) confirmed: Regimen is correct and unchanged.   Changes to medications: [name] reports stopping the following medications: Metformin and Starting taking Glipizide and Jardiance.  Changes to insurance: No  New side effects reported not previously addressed with a pharmacist or physician: None reported  Questions for the pharmacist: No    Confirmed patient received a Conservation officer, historic buildings and a Surveyor, mining with first shipment. The patient will receive a drug information handout for each medication shipped and additional FDA Medication Guides as required.       DISEASE/MEDICATION-SPECIFIC INFORMATION        N/A    SPECIALTY MEDICATION ADHERENCE     Medication Adherence    Patient reported X missed doses in the last month: 0  Specialty Medication: CALQUENCE 100 mg tablet  Patient is on additional specialty medications: No              Were doses missed due to medication being on hold? No    Calquence 100 mg: 7 days of medicine on hand        REFERRAL TO PHARMACIST     Referral to the pharmacist: Not needed      [facility]     Shipping address confirmed in Epic.     Delivery Scheduled: Yes, Expected medication delivery date: 12/08/21.     Medication will be delivered via Next Day Courier to the prescription address in Epic WAM.    Unk Lightning   [facility] Specialty Technician

## 2021-12-07 MED FILL — CALQUENCE (ACALABRUTINIB MALEATE) 100 MG TABLET: ORAL | 30 days supply | Qty: 60 | Fill #4

## 2021-12-18 ENCOUNTER — Ambulatory Visit: Admit: 2021-12-18 | Discharge: 2021-12-19 | Payer: MEDICARE

## 2021-12-18 DIAGNOSIS — C9110 Chronic lymphocytic leukemia of B-cell type not having achieved remission: Principal | ICD-10-CM

## 2021-12-18 DIAGNOSIS — D801 Nonfamilial hypogammaglobulinemia: Principal | ICD-10-CM

## 2021-12-18 DIAGNOSIS — D649 Anemia, unspecified: Principal | ICD-10-CM

## 2021-12-18 DIAGNOSIS — D509 Iron deficiency anemia, unspecified: Principal | ICD-10-CM

## 2021-12-18 LAB — CBC W/ AUTO DIFF
BASOPHILS ABSOLUTE COUNT: 0 10*9/L (ref 0.0–0.1)
BASOPHILS RELATIVE PERCENT: 0.6 %
EOSINOPHILS ABSOLUTE COUNT: 0.1 10*9/L (ref 0.0–0.5)
EOSINOPHILS RELATIVE PERCENT: 0.6 %
HEMATOCRIT: 27.8 % — ABNORMAL LOW (ref 39.0–48.0)
HEMOGLOBIN: 8.7 g/dL — ABNORMAL LOW (ref 12.9–16.5)
LYMPHOCYTES ABSOLUTE COUNT: 3.8 10*9/L — ABNORMAL HIGH (ref 1.1–3.6)
LYMPHOCYTES RELATIVE PERCENT: 46.3 %
MEAN CORPUSCULAR HEMOGLOBIN CONC: 31.5 g/dL — ABNORMAL LOW (ref 32.0–36.0)
MEAN CORPUSCULAR HEMOGLOBIN: 25.2 pg — ABNORMAL LOW (ref 25.9–32.4)
MEAN CORPUSCULAR VOLUME: 80 fL (ref 77.6–95.7)
MEAN PLATELET VOLUME: 8.1 fL (ref 6.8–10.7)
MONOCYTES ABSOLUTE COUNT: 0.6 10*9/L (ref 0.3–0.8)
MONOCYTES RELATIVE PERCENT: 7.4 %
NEUTROPHILS ABSOLUTE COUNT: 3.7 10*9/L (ref 1.8–7.8)
NEUTROPHILS RELATIVE PERCENT: 45.1 %
PLATELET COUNT: 117 10*9/L — ABNORMAL LOW (ref 150–450)
RED BLOOD CELL COUNT: 3.47 10*12/L — ABNORMAL LOW (ref 4.26–5.60)
RED CELL DISTRIBUTION WIDTH: 16 % — ABNORMAL HIGH (ref 12.2–15.2)
WBC ADJUSTED: 8.2 10*9/L (ref 3.6–11.2)

## 2021-12-18 LAB — COMPREHENSIVE METABOLIC PANEL
ALBUMIN: 3.3 g/dL — ABNORMAL LOW (ref 3.5–5.0)
ALKALINE PHOSPHATASE: 130 U/L — ABNORMAL HIGH (ref 46–116)
ALT (SGPT): 26 U/L (ref 12–78)
ANION GAP: 9 mmol/L (ref 3–11)
AST (SGOT): 14 U/L — ABNORMAL LOW (ref 15–40)
BILIRUBIN TOTAL: 0.4 mg/dL (ref 0.2–1.0)
BLOOD UREA NITROGEN: 61 mg/dL — ABNORMAL HIGH (ref 8–20)
BUN / CREAT RATIO: 25
CALCIUM: 8.4 mg/dL — ABNORMAL LOW (ref 8.5–10.1)
CHLORIDE: 105 mmol/L (ref 98–107)
CO2: 27.2 mmol/L (ref 21.0–32.0)
CREATININE: 2.42 mg/dL — ABNORMAL HIGH (ref 0.80–1.30)
EGFR CKD-EPI (2021) MALE: 26 mL/min/{1.73_m2} — ABNORMAL LOW (ref >=60)
GLUCOSE RANDOM: 292 mg/dL — ABNORMAL HIGH (ref 70–179)
POTASSIUM: 4.8 mmol/L (ref 3.5–5.0)
PROTEIN TOTAL: 5.7 g/dL — ABNORMAL LOW (ref 6.0–8.0)
SODIUM: 141 mmol/L (ref 135–145)

## 2021-12-18 LAB — IGG: GAMMAGLOBULIN; IGG: 260 mg/dL — ABNORMAL LOW (ref 646–2013)

## 2021-12-18 LAB — IRON & TIBC
IRON SATURATION: 11 % — ABNORMAL LOW (ref 20–55)
IRON: 33 ug/dL — ABNORMAL LOW (ref 50–150)
TOTAL IRON BINDING CAPACITY: 294 ug/dL (ref 250–450)

## 2021-12-18 LAB — LACTATE DEHYDROGENASE: LACTATE DEHYDROGENASE: 158 U/L (ref 81–234)

## 2021-12-18 LAB — FERRITIN: FERRITIN: 34 ng/mL (ref 10.5–307.3)

## 2021-12-18 MED ADMIN — immun glob G(IgG)-pro-IgA 0-50 (PRIVIGEN) 10 % intravenous solution 20 g: 20 g | INTRAVENOUS | @ 14:00:00 | Stop: 2021-12-18

## 2021-12-18 MED ADMIN — immun glob G(IgG)-pro-IgA 0-50 (PRIVIGEN) 10 % intravenous solution 5 g: 5 g | INTRAVENOUS | @ 14:00:00 | Stop: 2021-12-18

## 2021-12-18 MED ADMIN — diphenhydrAMINE (BENADRYL) capsule/tablet 25 mg: 25 mg | ORAL | @ 14:00:00 | Stop: 2021-12-18

## 2021-12-18 MED ADMIN — acetaminophen (TYLENOL) tablet 650 mg: 650 mg | ORAL | @ 14:00:00 | Stop: 2021-12-18

## 2021-12-18 NOTE — Unmapped (Signed)
HEMATOLOGY ONCOLOGY CLINIC:  INTERVAL FOLLOW UP  NOTE    REFERRING PHYSICIAN:  Elyse Hsu, MD  PCP:  Mliss Fritz, MD    DATE OF ENCOUNTER:  12/18/2021    INTERVAL HISTORY:  Christian Abbott is an 87 y.o. male who comes today for continued follow-up of chronic lymphocytic leukemia.     Given his advanced disease he was started on acalbrutinib 12/19/20.     He followed up with his cardiologist due to increasing shortness of breath in early October 2022.  On 06/02/2021 chest x-ray showed similar enlargement of the cardio pericardial silhouette and prominence of the central pulmonary vasculature which was suggestive of possible pulmonary vascular congestion.  A BNP level at that time was elevated 658. He was started on lasix and  Dose is actively being managed by cardiology.      On 08/13/21 he had a follow up CT chest which showed improved aeration and stable to improved mediastinal LAD.    He was recently started on Jardiance and is being followed by nephrology.      He is here today for a routine follow-up visit and to receive his IVIG which is being given every 3 months.     Seems to be tolerating his acalabrutinib fairly well.  Does report gradually worsening fatigue over the last several months   Although he previously mentioned that after starting Jardiance approximately 2 months ago his fatigue did slightly improve.    He is Slightly more anemic today with a hemoglobin of 8.7 from 9.4 last month.    Denies constipation diarrhea frankly bloody or melenic stools.      No B symptoms.         ONCOLOGY HISTORY  Oncology History Overview Note   87 y/o male with long-standing history of chronic lymphocytic leukemia initially diagnosed in 1998  Status post Rituxan based therapy for 8 weeks during that time  History of anemia treated with Procrit off and on  History of hypertension diabetes asthma hypercholesterolemia.  History of hospitalization in January 2014 for bilateral pneumonia [facility] for 3 weeks.       CLL (chronic lymphocytic leukemia) (CMS-HCC)   07/18/1997 Initial Diagnosis    CLL (chronic lymphocytic leukemia)             PAST MEDICAL HISTORY:     Patient Active Problem List   Diagnosis    CLL (chronic lymphocytic leukemia) (CMS-HCC)    Iron deficiency anemia    Hypogammaglobulinemia (CMS-HCC)    Anemia    Need for prophylactic immunotherapy      has no past surgical history on file.    ALLERGIES:  has No Known Allergies.      MEDICATIONS: has a current medication list which includes the following prescription(s): acalabrutinib, accu-chek aviva plus test strp, accu-chek guide me glucose mtr, accu-chek softclix lancets, amlodipine, amlodipine-atorvastatin, atorvastatin, bumetanide, chlorhexidine, cholecalciferol (vitamin d3-125 mcg (5,000 unit)), clopidogrel, cyanocobalamin (vitamin b-12), dexamethasone, doxazosin, famotidine, furosemide, glipizide, glipizide, glucosamine/chondr su a sod, hydralazine, ipratropium-albuterol, jardiance, jentadueto, victoza 2-pak, liraglutide, losartan, magnesium oxide, metoprolol succinate, mometasone-formoterol, spironolactone, sucralfate, true metrix level 2, victoza 3-pak, bioflex, zafirlukast, acalabrutinib, albuterol 2.5 mg /3 mL (0.083 %) Nebu 3 mL, albuterol 5 mg/mL Nebu 0.5 mL, albuterol, and ferrous sulfate, and the following Facility-Administered Medications: ferric carboxymaltose 750 mg in sodium chloride (NS) 0.9 % 250 mL IVPB, immun glob g(igg)-pro-iga 0-50 **FOLLOWED BY** immun glob g(igg)-pro-iga 0-50.    FAMILY HISTORY / SOCIAL HISTORY:  Reviewed and  updated as appropriate.    REVIEW OF SYSTEMS:  As per interval history.  All other systems reviewed and negative.    PHYSICAL EXAM    Vitals: BP 132/60  - Pulse 67  - Temp 36.5 ??C (97.7 ??F) (Temporal)  - Resp 16  - Ht 165.1 cm (5' 5)  - Wt 69.5 kg (153 lb 3.2 oz)  - SpO2 99%  - BMI 25.49 kg/m??   General:  Pleasant, no acute distress.    Pain:  Pain Evaluation:                                   Pain Score (0 - 1):                                Pain Location:                                       Eyes:  Appears normal.   HENT:  Atraumatic.   Neck:  thyroid midline.  Lymphatics: Bilateral small cervical lymph nodes.  No supraclavicular axillary or inguinal lymphadenopathy   Cardiovascular:   RRR, trace edema b/l ankles.   Respiratory:  Unlabored.  Good air entry b/l. No wheezing.  CTABL   Gastrointestinal: Does not appear distended.  Nontender. No palpable HSM.   Skin:  No rashes or subcutaneous nodules.    Musculoskeletal:  Gait is steady     Psychiatric:  Affect appropriate. Judgment and insight nl.  Neurologic:  Alert and oriented x3. Grossly non-focal.              RECENT LABS/PATHOLOGY:  Results for orders placed or performed in visit on 12/18/21   Comprehensive Metabolic Panel   Result Value Ref Range    Sodium 141 135 - 145 mmol/L    Potassium 4.8 3.5 - 5.0 mmol/L    Chloride 105 98 - 107 mmol/L    CO2 27.2 21.0 - 32.0 mmol/L    Anion Gap 9 3 - 11 mmol/L    BUN 61 (H) 8 - 20 mg/dL    Creatinine 9.60 (H) 0.80 - 1.30 mg/dL    BUN/Creatinine Ratio 25     eGFR CKD-EPI (2021) Male 26 (L) >=60 mL/min/1.21m2    Glucose 292 (H) 70 - 179 mg/dL    Calcium 8.4 (L) 8.5 - 10.1 mg/dL    Albumin 3.3 (L) 3.5 - 5.0 g/dL    Total Protein 5.7 (L) 6.0 - 8.0 g/dL    Total Bilirubin 0.4 0.2 - 1.0 mg/dL    AST 14 (L) 15 - 40 U/L    ALT 26 12 - 78 U/L    Alkaline Phosphatase 130 (H) 46 - 116 U/L   Lactate dehydrogenase   Result Value Ref Range    LDH 158 81 - 234 U/L   CBC w/ Differential   Result Value Ref Range    WBC 8.2 3.6 - 11.2 10*9/L    RBC 3.47 (L) 4.26 - 5.60 10*12/L    HGB 8.7 (L) 12.9 - 16.5 g/dL    HCT 45.4 (L) 09.8 - 48.0 %    MCV 80.0 77.6 - 95.7 fL    MCH 25.2 (L) 25.9 - 32.4 pg    MCHC 31.5 (L) 32.0 - 36.0  g/dL    RDW 16.1 (H) 09.6 - 15.2 %    MPV 8.1 6.8 - 10.7 fL    Platelet 117 (L) 150 - 450 10*9/L    Neutrophils % 45.1 %    Lymphocytes % 46.3 %    Monocytes % 7.4 %    Eosinophils % 0.6 %    Basophils % 0.6 % Absolute Neutrophils 3.7 1.8 - 7.8 10*9/L    Absolute Lymphocytes 3.8 (H) 1.1 - 3.6 10*9/L    Absolute Monocytes 0.6 0.3 - 0.8 10*9/L    Absolute Eosinophils 0.1 0.0 - 0.5 10*9/L    Absolute Basophils 0.0 0.0 - 0.1 10*9/L    Hypochromasia Slight (A) Not Present     RADIOLOGY    11/14/2020  PET CT   Patchy consolidative opacities within the lungs, increased from chest CT 10/16/2020, particularly in the lingula with areas FDG avid focus.   This is likely infectious/inflammatory.   Recommend follow-up chest CT in 3 months.  Multiple enlarged lymph nodes in the neck, chest, abdomen and pelvis without significant FDG avidity.   Splenomegaly, without significant FDG avidity      02/13/21 CT chest:  1.  Mixed response of pulmonary infiltrates to interval therapy as detailed above.  The hypermetabolic apicoposterior infiltrate seen previously in the RIGHT upper lobe has largely resolved as have the nodular infiltrates seen previously in the inferior lingula while hypermetabolic infiltrate seen previously in the perihilar and peripheral RIGHT lower lobe is worse.  Follow-up CT the chest is recommended in 3 months.     2.  Interval improvement in mediastinal, bilateral hilar, gastrohepatic ligament, and splenic hilar adenopathy.  The spleen is also slightly smaller.     3.  Stable probably benign 1.7 cm LEFT adrenal mass.  Attention to this on follow-up imaging is indicated.     4.  Moderate to advanced atherosclerosis without aneurysm or dissection.  Multivessel coronary artery disease.  Chronic occlusion of the celiac artery.      05/14/21 CT Chest   1.  Fluctuating pulmonary opacities compared to the prior exams.  Compared to June 2022, these are improved in the perihilar right upper and right lower lobe as well as the dependent left lung base.  They are mildly worse in the right middle lobe, anterolateral right lower lobe,, lingula, and left lower lobe superior segment.  2.  0.4 cm right upper lobe nodule is new compared to June 2022.  Additional discrete small lung nodules are unchanged since February 2022.  Continued attention on follow-up.  3.  Mediastinal, hilar, and gastrohepatic lymphadenopathy is stable to minimally decreased compared to June 2022.  Spleen size is stable.    08/13/21 CT chest   IMPRESSION:  1. Overall improved aeration in the bilateral lungs, suggesting improved infectious or inflammatory pneumonitis since prior examination. Persistent linear and subpleural reticular opacities bilaterally, suggestive of residual disease.  2. Scattered micronodules bilaterally, unchanged since recent prior examinations. No new, or enlarging nodules are noted. Recommend follow-up examination in 6-12 months based on Fleischner Society criteria.  3. Mediastinal and hilar lymph nodes appear relatively unchanged to slightly improved in the interval. No new or enlarging adenopathy.    ASSESSMENT and PLAN:    # Chronic lymphocytic leukemia  --Rai stage IV CLL  --CLL FISH panel shows 13 q. deletion 21.5%, IGHV mutated -good prognostic features  --11/05/2020: TP53 mutation negative.  NOTCH1 negative.  SF3B1 positive.  SF3B1 positivity indicates poor prognosis.  -- 07/2019 DAT, haptoglobin,  LDH normal-no hemolysis  --Indication for treatment:  advanced stage and history of thrombocytopenia  -- He has been treated with Rituxan many years ago by Dr Thedore Mins, with good response in the 1990s. Treatment indication at that time may have been frequent infections.  -- started acabrutinib 12/19/20  -- Reviewed cbc CMP and LDH today.  ALC stable and down trending. No e/o  POD.   --  chronic fatigue appears to be stable to slight worse in the last month.     PLAN:  -- continue Acalbrutinib 100 mg BID.   -- I have recommended follow-up labs and appointment to see me in 4 weeks with patient wants to push this out to 6 weeks        # Patchy lung opacities  -- most recent CT chest from 05/16/2021 mixed response with regardin the GGOs.  He continues to have no infectious symptoms.  Patient is we would expect after his COVID diagnosis.  He followed up with Dr. Virgia Land recently.  Findings on his CT chest are felt to represent volume overload as noted by a recent elevation in proBNP on 06/02/2021.  No pulmonary intervention was recommended by Dr. Virgia Land at this time.  --follow up CT chest 08/13/21 showed overall improved aeration. Small indeterminate micronodules at stbale and per radiology require 6-12 month follow up . We will continue to monitor cliically and repet CT chest in  In 07/2022 - or sooner if clinically indicated.    -- he will continue following with Dr Ace Gins.     #History of iron deficiency anemia  -- He was noted to have iron deficiency in 2014 and has since received IV iron in the past and has been on oral iron 1 pill/day.   -- iron deficiency anemia/ACD in 07/2019 with hb down to 8.7 and ferritin 78, iron sat 12%  -- now s/p 1020 mg ferheme 07/2019 and early 10/2018 with hb improvement to 10.9 today.   --EGD and colonoscopy with Dr. Loreta Ave 01/2020 revealed bleeding gastric ulcer, smaller antral ulcer with old blood. .  Currently being managed with sucralfate.  -- Today hemoglobin is 8.7.  Last month his ferritin was 50 but has been trending downward.  Iron saturation 11%.  We will repeat iron panel and ferritin today if ferritin is less than 50 will order additional IV iron.  If no evidence of iron deficiency based on iron panel and ferritin then we discussed consideration of ESA's as anemia of CKD could be contributing to his anemia.    # Hypogammaglobulinemia   --History level continues to be low however he has not had any more infections since we started him on IVIG. Before we started him on IVIG he has had multiple hospitalizations for pneumonias.  --Therefore we will continue IVIG for now every 3 months as he is tolerated this well.  --received IVIG  In 08/2021   -- He will receive IVIG today.  Next appointment will be scheduled at his next visit.  To be done ~ 03/18/22         # Health maintenance:  --  Evusheld  Received 12/04/20   -- bivalent booster  06/01/21  -- flu shot 06/01/21  --evusheld  #2 07/01/21.       # diastolic CHF with volume overload  -- followed closely by Cardiology.recently switched to bumex.   -- diuresis has bee associated with some degree of renal dysfunction in the last 5-6 months which could be contributing to his anemia.    --  he will continue following closely with Cardiology and is going to talk with cardiology about consider a nephrology consult.     # CKD Stage IV  -- Following closely with [facility].  -- Today his creatinine is trended upwards to 2.42.  Remains nonoliguric  -- Previous baseline creatinine has been around 1.7-1.89.  -- I recommended that he follow-up with his nephrologist given the trend up in his creatinine.  Potassium normal.  -- Spoke to patient's daughter on the phone today and she will contact nephrology.        DISPO:  RTC in 6 weeks for labs and MD visit.    .     I have independently reviewed notes from prior visits, results from labs and imaging.  The current plan of care requires intensive monitoring for toxicity due to use of antineoplastic treatments.  Medical decision making was of high complexity due to to ongoing risk of morbidity/mortality from antineoplastic treatment.      Elyse Hsu, MD  [facility]

## 2021-12-18 NOTE — Unmapped (Signed)
Infusion completed, tolerated well, VSS.  Patient provided blankets, drinks, and snacks as needed throughout infusion.  Comfort continually assessed by staff.  PIV de-accessed per [place] protocol. Patient discharged in stable condition, ambulatory.  Return to clinic information provided.

## 2021-12-29 NOTE — Unmapped (Signed)
[facility] Refill Coordination Note    Specialty Medication(s) to be Shipped:   Hematology/Oncology: Calquence 100mg     Other medication(s) to be shipped: No additional medications requested for fill at this time     Christian Abbott, DOB: 12-01-1934  Phone: [phone] (home)       All above HIPAA information was verified with patient's daughter     Was a Nurse, learning disability used for this call? No    Completed refill call assessment today to schedule patient's medication shipment from the [facility] [phone]).  All relevant notes have been reviewed.     Specialty medication(s) and dose(s) confirmed: Regimen is correct and unchanged.   Changes to medications: Christian Abbott reports no changes at this time.  Changes to insurance: No  New side effects reported not previously addressed with a pharmacist or physician: None reported  Questions for the pharmacist: No    Confirmed patient received a Conservation officer, historic buildings and a Surveyor, mining with first shipment. The patient will receive a drug information handout for each medication shipped and additional FDA Medication Guides as required.       DISEASE/MEDICATION-SPECIFIC INFORMATION        N/A    SPECIALTY MEDICATION ADHERENCE     Medication Adherence    Patient reported X missed doses in the last month: 0  Specialty Medication: CALQUENCE 100 mg tablet (acalabrutinib)  Patient is on additional specialty medications: No  Informant: child/children              Were doses missed due to medication being on hold? No    Calquence 100 mg: 15 days of medicine on hand        REFERRAL TO PHARMACIST     Referral to the pharmacist: Not needed      [facility]     Shipping address confirmed in Epic.     Delivery Scheduled: Yes, Expected medication delivery date: 01/11/22.     Medication will be delivered via Same Day Courier to the prescription address in Epic WAM.    Christian Abbott   [facility] Specialty Technician

## 2022-01-11 MED FILL — CALQUENCE (ACALABRUTINIB MALEATE) 100 MG TABLET: ORAL | 30 days supply | Qty: 60 | Fill #5

## 2022-01-21 NOTE — Unmapped (Signed)
Patient's daughter called to let provider know that patient had his first physical with new PCP, Dr. Michae Kava and that lab work from today will be faxed over. She was concerned that Hgb dropped from 8.7 to 8.6 and wanted to know if provider needs to address that prior to patient's appt on 02/01/22. Care manager explained that while the overall Hgb is low, the slight decrease can be addressed at patient's follow up appt. Daughter verbalized understanding.

## 2022-01-28 DIAGNOSIS — C9110 Chronic lymphocytic leukemia of B-cell type not having achieved remission: Principal | ICD-10-CM

## 2022-02-01 ENCOUNTER — Ambulatory Visit: Admit: 2022-02-01 | Discharge: 2022-02-01 | Payer: MEDICARE

## 2022-02-01 DIAGNOSIS — D509 Iron deficiency anemia, unspecified: Principal | ICD-10-CM

## 2022-02-01 DIAGNOSIS — D649 Anemia, unspecified: Principal | ICD-10-CM

## 2022-02-01 DIAGNOSIS — D508 Other iron deficiency anemias: Principal | ICD-10-CM

## 2022-02-01 DIAGNOSIS — C9110 Chronic lymphocytic leukemia of B-cell type not having achieved remission: Principal | ICD-10-CM

## 2022-02-01 LAB — CBC W/ AUTO DIFF
BASOPHILS ABSOLUTE COUNT: 0.1 10*9/L (ref 0.0–0.1)
BASOPHILS RELATIVE PERCENT: 0.8 %
EOSINOPHILS ABSOLUTE COUNT: 0.1 10*9/L (ref 0.0–0.5)
EOSINOPHILS RELATIVE PERCENT: 0.8 %
HEMATOCRIT: 26.4 % — ABNORMAL LOW (ref 39.0–48.0)
HEMOGLOBIN: 8.3 g/dL — ABNORMAL LOW (ref 12.9–16.5)
LYMPHOCYTES ABSOLUTE COUNT: 3.7 10*9/L — ABNORMAL HIGH (ref 1.1–3.6)
LYMPHOCYTES RELATIVE PERCENT: 53.8 %
MEAN CORPUSCULAR HEMOGLOBIN CONC: 31.2 g/dL — ABNORMAL LOW (ref 32.0–36.0)
MEAN CORPUSCULAR HEMOGLOBIN: 23 pg — ABNORMAL LOW (ref 25.9–32.4)
MEAN CORPUSCULAR VOLUME: 73.6 fL — ABNORMAL LOW (ref 77.6–95.7)
MEAN PLATELET VOLUME: 7.9 fL (ref 6.8–10.7)
MONOCYTES ABSOLUTE COUNT: 0.5 10*9/L (ref 0.3–0.8)
MONOCYTES RELATIVE PERCENT: 7.7 %
NEUTROPHILS ABSOLUTE COUNT: 2.5 10*9/L (ref 1.8–7.8)
NEUTROPHILS RELATIVE PERCENT: 36.9 %
PLATELET COUNT: 117 10*9/L — ABNORMAL LOW (ref 150–450)
RED BLOOD CELL COUNT: 3.59 10*12/L — ABNORMAL LOW (ref 4.26–5.60)
RED CELL DISTRIBUTION WIDTH: 15.2 % (ref 12.2–15.2)
WBC ADJUSTED: 6.8 10*9/L (ref 3.6–11.2)

## 2022-02-01 LAB — COMPREHENSIVE METABOLIC PANEL
ALBUMIN: 3.3 g/dL — ABNORMAL LOW (ref 3.5–5.0)
ALKALINE PHOSPHATASE: 127 U/L — ABNORMAL HIGH (ref 46–116)
ALT (SGPT): 24 U/L (ref 12–78)
ANION GAP: 9 mmol/L (ref 3–11)
AST (SGOT): 15 U/L (ref 15–40)
BILIRUBIN TOTAL: 0.4 mg/dL (ref 0.2–1.0)
BLOOD UREA NITROGEN: 45 mg/dL — ABNORMAL HIGH (ref 8–20)
BUN / CREAT RATIO: 26
CALCIUM: 8.8 mg/dL (ref 8.5–10.1)
CHLORIDE: 103 mmol/L (ref 98–107)
CO2: 30.2 mmol/L (ref 21.0–32.0)
CREATININE: 1.76 mg/dL — ABNORMAL HIGH (ref 0.80–1.30)
EGFR CKD-EPI (2021) MALE: 38 mL/min/{1.73_m2} — ABNORMAL LOW (ref >=60)
GLUCOSE RANDOM: 226 mg/dL — ABNORMAL HIGH (ref 70–179)
POTASSIUM: 3.7 mmol/L (ref 3.5–5.0)
PROTEIN TOTAL: 6.1 g/dL (ref 6.0–8.0)
SODIUM: 142 mmol/L (ref 135–145)

## 2022-02-01 LAB — RETICULOCYTES
RETICULOCYTE ABSOLUTE COUNT: 88.1 10*9/L — ABNORMAL HIGH (ref 23.0–80.0)
RETICULOCYTE COUNT PCT: 2.4 % — ABNORMAL HIGH (ref 0.50–1.70)

## 2022-02-01 LAB — IRON & TIBC
IRON SATURATION: 8 % — ABNORMAL LOW (ref 20–55)
IRON: 25 ug/dL — ABNORMAL LOW (ref 50–150)
TOTAL IRON BINDING CAPACITY: 330 ug/dL (ref 250–450)

## 2022-02-01 LAB — FOLATE: FOLATE: >48 ng/mL

## 2022-02-01 LAB — VITAMIN B12: VITAMIN B-12: 402 pg/mL (ref 211–911)

## 2022-02-01 LAB — FERRITIN: FERRITIN: 17 ng/mL (ref 10.5–307.3)

## 2022-02-02 LAB — VITAMIN D 25 HYDROXY: VITAMIN D, TOTAL (25OH): 73.7 ng/mL (ref 20.0–80.0)

## 2022-02-02 NOTE — Unmapped (Signed)
Letter to [facility] Adv sent to Christian Abbott regarding request for approval of Feraheme.

## 2022-02-03 DIAGNOSIS — C9110 Chronic lymphocytic leukemia of B-cell type not having achieved remission: Principal | ICD-10-CM

## 2022-02-03 MED ORDER — ACALABRUTINIB MALEATE 100 MG TABLET
ORAL_TABLET | Freq: Two times a day (BID) | ORAL | 5 refills | 30 days | Status: CP
Start: 2022-02-03 — End: ?
  Filled 2022-02-15: qty 60, 30d supply, fill #0

## 2022-02-03 NOTE — Unmapped (Signed)
HEMATOLOGY ONCOLOGY CLINIC:  INTERVAL FOLLOW UP  NOTE    REFERRING PHYSICIAN:  Elyse Hsu, MD  PCP:  No primary care provider on file.    DATE OF ENCOUNTER:  02/01/2022    INTERVAL HISTORY:  Christian Abbott is an 87 y.o. male who comes today for continued follow-up of chronic lymphocytic leukemia.     Given his advanced disease he was started on acalbrutinib 12/19/20.     He followed up with his cardiologist due to increasing shortness of breath in early October 2022.  On 06/02/2021 chest x-ray showed similar enlargement of the cardio pericardial silhouette and prominence of the central pulmonary vasculature which was suggestive of possible pulmonary vascular congestion.  A BNP level at that time was elevated 658. He was started on lasix and  Dose is actively being managed by cardiology.      On 08/13/21 he had a follow up CT chest which showed improved aeration and stable to improved mediastinal LAD.    He was recently started on Jardiance and is being followed by nephrology.      He is here today for a routine follow-up visit and to receive his IVIG which is being given every 3 months.     Seems to be tolerating his acalabrutinib fairly well.  Does report gradually worsening fatigue over the last several months       He is Slightly more anemic today with a hemoglobin of 8.3 from 8.7 last month.    Denies constipation diarrhea frankly bloody or melenic stools.    No B symptoms.         ONCOLOGY HISTORY  Oncology History Overview Note   87 y/o male with long-standing history of chronic lymphocytic leukemia initially diagnosed in 1998  Status post Rituxan based therapy for 8 weeks during that time  History of anemia treated with Procrit off and on  History of hypertension diabetes asthma hypercholesterolemia.  History of hospitalization in January 2014 for bilateral pneumonia [facility] for 3 weeks.       CLL (chronic lymphocytic leukemia) (CMS-HCC)   07/18/1997 Initial Diagnosis    CLL (chronic lymphocytic leukemia)             PAST MEDICAL HISTORY:     Patient Active Problem List   Diagnosis    CLL (chronic lymphocytic leukemia) (CMS-HCC)    Iron deficiency anemia    Hypogammaglobulinemia (CMS-HCC)    Anemia    Need for prophylactic immunotherapy      has no past surgical history on file.    ALLERGIES:  has No Known Allergies.      MEDICATIONS: has a current medication list which includes the following prescription(s): acalabrutinib, acalabrutinib, accu-chek aviva plus test strp, accu-chek guide me glucose mtr, accu-chek softclix lancets, albuterol 2.5 mg /3 mL (0.083 %) Nebu 3 mL, albuterol 5 mg/mL Nebu 0.5 mL, albuterol, amlodipine, amlodipine-atorvastatin, atorvastatin, bumetanide, chlorhexidine, cholecalciferol (vitamin d3-125 mcg (5,000 unit)), clopidogrel, cyanocobalamin (vitamin b-12), dexamethasone, doxazosin, famotidine, ferrous sulfate, furosemide, glipizide, glipizide, glucosamine/chondr su a sod, hydralazine, ipratropium-albuterol, jardiance, jentadueto, victoza 2-pak, liraglutide, losartan, magnesium oxide, metoprolol succinate, mometasone-formoterol, spironolactone, sucralfate, true metrix level 2, victoza 3-pak, bioflex, and zafirlukast, and the following Facility-Administered Medications: ferric carboxymaltose 750 mg in sodium chloride (NS) 0.9 % 250 mL IVPB.    FAMILY HISTORY / SOCIAL HISTORY:  Reviewed and updated as appropriate.    REVIEW OF SYSTEMS:  As per interval history.  All other systems reviewed and negative.    PHYSICAL EXAM  Vitals: BP 134/63  - Pulse 63  - Temp 36.3 ??C (97.3 ??F) (Temporal)  - Ht 165.1 cm (5' 5)  - Wt 70.1 kg (154 lb 8 oz)  - SpO2 98%  - BMI 25.71 kg/m??   General:  Pleasant, no acute distress.    Pain:  Pain Evaluation:                                   Pain Score (0 - 1):                                Pain Location:                                       Eyes:  Appears normal.   HENT:  Atraumatic.   Neck:  thyroid midline.  Lymphatics: Bilateral small cervical lymph nodes.  No supraclavicular axillary or inguinal lymphadenopathy   Cardiovascular:   RRR, trace edema b/l ankles.   Respiratory:  Unlabored.  Good air entry b/l. No wheezing.  CTABL   Gastrointestinal: Does not appear distended.  Nontender. No palpable HSM.   Skin:  No rashes or subcutaneous nodules.    Musculoskeletal:  Gait is steady     Psychiatric:  Affect appropriate. Judgment and insight nl.  Neurologic:  Alert and oriented x3. Grossly non-focal.              RECENT LABS/PATHOLOGY:  Results for orders placed or performed in visit on 02/01/22   Comprehensive Metabolic Panel   Result Value Ref Range    Sodium 142 135 - 145 mmol/L    Potassium 3.7 3.5 - 5.0 mmol/L    Chloride 103 98 - 107 mmol/L    CO2 30.2 21.0 - 32.0 mmol/L    Anion Gap 9 3 - 11 mmol/L    BUN 45 (H) 8 - 20 mg/dL    Creatinine 5.40 (H) 0.80 - 1.30 mg/dL    BUN/Creatinine Ratio 26     eGFR CKD-EPI (2021) Male 38 (L) >=60 mL/min/1.46m2    Glucose 226 (H) 70 - 179 mg/dL    Calcium 8.8 8.5 - 98.1 mg/dL    Albumin 3.3 (L) 3.5 - 5.0 g/dL    Total Protein 6.1 6.0 - 8.0 g/dL    Total Bilirubin 0.4 0.2 - 1.0 mg/dL    AST 15 15 - 40 U/L    ALT 24 12 - 78 U/L    Alkaline Phosphatase 127 (H) 46 - 116 U/L   Iron & TIBC   Result Value Ref Range    Iron 25 (L) 50 - 150 ug/dL    TIBC 191 478 - 295 ug/dL    Iron Saturation (%) 8 (L) 20 - 55 %   Ferritin   Result Value Ref Range    Ferritin 17.0 10.5 - 307.3 ng/mL   Reticulocytes   Result Value Ref Range    Reticulocyte Auto % 2.40 (H) 0.50 - 1.70 %    Absolute Auto Reticulocyte 88.1 (H) 23.0 - 80.0 10*9/L   Vitamin D 25 Hydroxy (25OH D2 + D3)   Result Value Ref Range    Vitamin D Total (25OH) 73.7 20.0 - 80.0 ng/mL   Folate Level   Result Value Ref Range  Folate >48.0 ng/mL   Vitamin B12 Level   Result Value Ref Range    Vitamin B-12 402 211 - 911 pg/ml   CBC w/ Differential   Result Value Ref Range    WBC 6.8 3.6 - 11.2 10*9/L    RBC 3.59 (L) 4.26 - 5.60 10*12/L    HGB 8.3 (L) 12.9 - 16.5 g/dL HCT 16.1 (L) 09.6 - 04.5 %    MCV 73.6 (L) 77.6 - 95.7 fL    MCH 23.0 (L) 25.9 - 32.4 pg    MCHC 31.2 (L) 32.0 - 36.0 g/dL    RDW 40.9 81.1 - 91.4 %    MPV 7.9 6.8 - 10.7 fL    Platelet 117 (L) 150 - 450 10*9/L    Neutrophils % 36.9 %    Lymphocytes % 53.8 %    Monocytes % 7.7 %    Eosinophils % 0.8 %    Basophils % 0.8 %    Absolute Neutrophils 2.5 1.8 - 7.8 10*9/L    Absolute Lymphocytes 3.7 (H) 1.1 - 3.6 10*9/L    Absolute Monocytes 0.5 0.3 - 0.8 10*9/L    Absolute Eosinophils 0.1 0.0 - 0.5 10*9/L    Absolute Basophils 0.1 0.0 - 0.1 10*9/L    Microcytosis Slight (A) Not Present    Hypochromasia Marked (A) Not Present     RADIOLOGY    11/14/2020  PET CT   Patchy consolidative opacities within the lungs, increased from chest CT 10/16/2020, particularly in the lingula with areas FDG avid focus.   This is likely infectious/inflammatory.   Recommend follow-up chest CT in 3 months.  Multiple enlarged lymph nodes in the neck, chest, abdomen and pelvis without significant FDG avidity.   Splenomegaly, without significant FDG avidity      02/13/21 CT chest:  1.  Mixed response of pulmonary infiltrates to interval therapy as detailed above.  The hypermetabolic apicoposterior infiltrate seen previously in the RIGHT upper lobe has largely resolved as have the nodular infiltrates seen previously in the inferior lingula while hypermetabolic infiltrate seen previously in the perihilar and peripheral RIGHT lower lobe is worse.  Follow-up CT the chest is recommended in 3 months.     2.  Interval improvement in mediastinal, bilateral hilar, gastrohepatic ligament, and splenic hilar adenopathy.  The spleen is also slightly smaller.     3.  Stable probably benign 1.7 cm LEFT adrenal mass.  Attention to this on follow-up imaging is indicated.     4.  Moderate to advanced atherosclerosis without aneurysm or dissection.  Multivessel coronary artery disease.  Chronic occlusion of the celiac artery.      05/14/21 CT Chest   1.  Fluctuating pulmonary opacities compared to the prior exams.  Compared to June 2022, these are improved in the perihilar right upper and right lower lobe as well as the dependent left lung base.  They are mildly worse in the right middle lobe, anterolateral right lower lobe,, lingula, and left lower lobe superior segment.  2.  0.4 cm right upper lobe nodule is new compared to June 2022.  Additional discrete small lung nodules are unchanged since February 2022.  Continued attention on follow-up.  3.  Mediastinal, hilar, and gastrohepatic lymphadenopathy is stable to minimally decreased compared to June 2022.  Spleen size is stable.    08/13/21 CT chest   IMPRESSION:  1. Overall improved aeration in the bilateral lungs, suggesting improved infectious or inflammatory pneumonitis since prior examination. Persistent linear and subpleural  reticular opacities bilaterally, suggestive of residual disease.  2. Scattered micronodules bilaterally, unchanged since recent prior examinations. No new, or enlarging nodules are noted. Recommend follow-up examination in 6-12 months based on Fleischner Society criteria.  3. Mediastinal and hilar lymph nodes appear relatively unchanged to slightly improved in the interval. No new or enlarging adenopathy.    ASSESSMENT and PLAN:    # Chronic lymphocytic leukemia  --Rai stage IV CLL  --CLL FISH panel shows 13 q. deletion 21.5%, IGHV mutated -good prognostic features  --11/05/2020: TP53 mutation negative.  NOTCH1 negative.  SF3B1 positive.  SF3B1 positivity indicates poor prognosis.  -- 07/2019 DAT, haptoglobin, LDH normal-no hemolysis  --Indication for treatment:  advanced stage and history of thrombocytopenia  -- He has been treated with Rituxan many years ago by Dr Thedore Mins, with good response in the 1990s. Treatment indication at that time may have been frequent infections.  -- started acabrutinib 12/19/20  -- Reviewed cbc CMP and LDH today.  ALC stable and down trending. No e/o  POD.   --  chronic fatigue appears to be stable to slight worse in the last month.     PLAN:  -- continue Acalbrutinib 100 mg BID.   -- I have recommended follow-up labs and appointment to see me in 4 weeks         # Patchy lung opacities  -- most recent CT chest from 05/16/2021 mixed response with regardin the GGOs.  He continues to have no infectious symptoms.  Patient is we would expect after his COVID diagnosis.  He followed up with Dr. Virgia Land recently.  Findings on his CT chest are felt to represent volume overload as noted by a recent elevation in proBNP on 06/02/2021.  No pulmonary intervention was recommended by Dr. Virgia Land at this time.  --follow up CT chest 08/13/21 showed overall improved aeration. Small indeterminate micronodules at stbale and per radiology require 6-12 month follow up . We will continue to monitor cliically and repet CT chest in  In 07/2022 - or sooner if clinically indicated.    -- he will continue following with Dr Ace Gins.     #Iron deficiency anemia  -- He was noted to have iron deficiency in 2014 and has since received IV iron in the past and has been on oral iron 1 pill/day.   -- iron deficiency anemia/ACD in 07/2019 with hb down to 8.7 and ferritin 78, iron sat 12%  -- now s/p 1020 mg ferheme 07/2019 and early 10/2018 with hb improvement to 10.9 today.   --EGD and colonoscopy with Dr. Loreta Ave 01/2020 revealed bleeding gastric ulcer, smaller antral ulcer with old blood. .  Currently being managed with sucralfate.  -- hb down to 8.3 today.   -- iron panel and feritin c/with IV iron. Ordered feraheme x 2 doses 1 week apart.     # Hypogammaglobulinemia   --History level continues to be low however he has not had any more infections since we started him on IVIG. Before we started him on IVIG he has had multiple hospitalizations for pneumonias.  --Therefore we will continue IVIG for now every 3 months as he is tolerated this well.  --  next IVIG schedule for  early August.          # Health maintenance:  --  Evusheld Received 12/04/20   -- bivalent booster  06/01/21  -- flu shot 06/01/21  --evusheld  #2 07/01/21.       # diastolic CHF with volume overload  --  followed closely by Cardiology.recently switched to bumex.   -- diuresis has bee associated with some degree of renal dysfunction in the last 5-6 months which could be contributing to his anemia.    -- he will continue following closely with Cardiology and is going to talk with cardiology about consider a nephrology consult.     # CKD Stage IV  -- Following closely with [facility].  -- Today his creatinine is trended upwards to 2.42.  Remains nonoliguric  -- Previous baseline creatinine has been around 1.7-1.89.  -- I recommended that he follow-up with his nephrologist given the trend up in his creatinine.  Potassium normal.  -- Spoke to patient's daughter on the phone today and she will contact nephrology.        DISPO:  RTC in 4-5 weeks for MD visit.     .     I have independently reviewed notes from prior visits, results from labs and imaging.  The current plan of care requires intensive monitoring for toxicity due to use of antineoplastic treatments.  Medical decision making was of high complexity due to to ongoing risk of morbidity/mortality from antineoplastic treatment.      Elyse Hsu, MD  [facility]

## 2022-02-04 NOTE — Unmapped (Signed)
[facility] Refill Coordination Note    Specialty Medication(s) to be Shipped:   Hematology/Oncology: Calquence 100mg     Other medication(s) to be shipped: No additional medications requested for fill at this time     Christian Abbott, DOB: [birth year]  Phone: [phone] (home)       All above HIPAA information was verified with patient's daughter     Was a Nurse, learning disability used for this call? No    Completed refill call assessment today to schedule patient's medication shipment from the [facility] [phone]).  All relevant notes have been reviewed.     Specialty medication(s) and dose(s) confirmed: Regimen is correct and unchanged.   Changes to medications: Christian Abbott reports no changes at this time.  Changes to insurance: No  New side effects reported not previously addressed with a pharmacist or physician: None reported  Questions for the pharmacist: No    Confirmed patient received a Conservation officer, historic buildings and a Surveyor, mining with first shipment. The patient will receive a drug information handout for each medication shipped and additional FDA Medication Guides as required.       DISEASE/MEDICATION-SPECIFIC INFORMATION        N/A    SPECIALTY MEDICATION ADHERENCE     Medication Adherence    Patient reported X missed doses in the last month: 0  Specialty Medication: Calquence 100mg   Patient is on additional specialty medications: No  Informant: child/children              Were doses missed due to medication being on hold? No    Calquence 100 mg: 17 days of medicine on hand       REFERRAL TO PHARMACIST     Referral to the pharmacist: Not needed      [facility]     Shipping address confirmed in Epic.     Delivery Scheduled: Yes, Expected medication delivery date: 02/19/22.     Medication will be delivered via Next Day Courier to the prescription address in Epic WAM.    [name]   [facility] Specialty Technician

## 2022-02-15 ENCOUNTER — Ambulatory Visit: Admit: 2022-02-15 | Discharge: 2022-02-16 | Payer: MEDICARE

## 2022-02-15 DIAGNOSIS — D509 Iron deficiency anemia, unspecified: Principal | ICD-10-CM

## 2022-02-15 DIAGNOSIS — C9110 Chronic lymphocytic leukemia of B-cell type not having achieved remission: Principal | ICD-10-CM

## 2022-02-15 DIAGNOSIS — D801 Nonfamilial hypogammaglobulinemia: Principal | ICD-10-CM

## 2022-02-15 MED ADMIN — acetaminophen (TYLENOL) tablet 650 mg: 650 mg | ORAL | @ 14:00:00 | Stop: 2022-02-15

## 2022-02-15 MED ADMIN — ferumoxytoL (FERAHEME) 510 mg in sodium chloride 0.9 % (NS) 100 mL IVPB-MBP: 510 mg | INTRAVENOUS | @ 15:00:00 | Stop: 2022-02-15

## 2022-02-15 MED ADMIN — diphenhydrAMINE (BENADRYL) capsule/tablet 25 mg: 25 mg | ORAL | @ 14:00:00 | Stop: 2022-02-15

## 2022-02-15 NOTE — Unmapped (Signed)
Ferahme completed, tolerated well, VSS. Post monitoring time completed. Patient provided blankets, drinks, and snacks as needed throughout infusion.  Comfort continually assessed by staff.  PIV de-accessed per [place] protocol. Patient discharged in stable condition, ambulatory.  Return to clinic information provided.

## 2022-02-15 NOTE — Unmapped (Signed)
Patient's daughter Lissa Hoard called and requested for Calquence medication to go out today 02/15/22 via Same Day Courier, due to patient being out. Daughter states she got mixed up in dates and thought they would have enough on hand, but he is out. Patient has not missed any doses yet.

## 2022-02-23 ENCOUNTER — Ambulatory Visit: Admit: 2022-02-23 | Discharge: 2022-02-24 | Payer: MEDICARE

## 2022-02-23 DIAGNOSIS — D801 Nonfamilial hypogammaglobulinemia: Principal | ICD-10-CM

## 2022-02-23 DIAGNOSIS — D509 Iron deficiency anemia, unspecified: Principal | ICD-10-CM

## 2022-02-23 DIAGNOSIS — C9110 Chronic lymphocytic leukemia of B-cell type not having achieved remission: Principal | ICD-10-CM

## 2022-02-23 MED ADMIN — ferumoxytoL (FERAHEME) 510 mg in sodium chloride 0.9 % (NS) 100 mL IVPB-MBP: 510 mg | INTRAVENOUS | @ 15:00:00 | Stop: 2022-02-23

## 2022-02-23 MED ADMIN — diphenhydrAMINE (BENADRYL) capsule/tablet 25 mg: 25 mg | ORAL | @ 15:00:00 | Stop: 2022-02-23

## 2022-02-23 MED ADMIN — acetaminophen (TYLENOL) tablet 650 mg: 650 mg | ORAL | @ 15:00:00 | Stop: 2022-02-23

## 2022-02-23 NOTE — Unmapped (Signed)
Patient here for feraheme infusion. See MAR for premedications given. Infusion completed, tolerated well, VSS. Patient monitored for 30 minutes after infusion without sign of adverse effect/drug reaction. Patient provided blankets, drinks, and snacks as needed throughout infusion. Comfort continually assessed by staff. PIV flushed then removed per [place] protocol. Patient discharged in stable condition, ambulatory. Return to clinic information provided.

## 2022-02-27 DIAGNOSIS — D801 Nonfamilial hypogammaglobulinemia: Principal | ICD-10-CM

## 2022-03-09 NOTE — Unmapped (Signed)
[facility] Refill Coordination Note    Specialty Medication(s) to be Shipped:   Hematology/Oncology: Calquence 100mg     Other medication(s) to be shipped: No additional medications requested for fill at this time     Christian Abbott, DOB: 07/10/1935  Phone: [phone] (home)       All above HIPAA information was verified with patient's daughter     Was a Nurse, learning disability used for this call? No    Completed refill call assessment today to schedule patient's medication shipment from the [facility] [phone]).  All relevant notes have been reviewed.     Specialty medication(s) and dose(s) confirmed: Regimen is correct and unchanged.   Changes to medications: Dsean reports no changes at this time.  Changes to insurance: No  New side effects reported not previously addressed with a pharmacist or physician: None reported  Questions for the pharmacist: No    Confirmed patient received a Conservation officer, historic buildings and a Surveyor, mining with first shipment. The patient will receive a drug information handout for each medication shipped and additional FDA Medication Guides as required.       DISEASE/MEDICATION-SPECIFIC INFORMATION        N/A    SPECIALTY MEDICATION ADHERENCE     Medication Adherence    Patient reported X missed doses in the last month: 0  Specialty Medication: CALQUENCE 100 mg tablet (acalabrutinib)  Patient is on additional specialty medications: No  Informant: patient              Were doses missed due to medication being on hold? No    Calquence 100 mg: 7 days of medicine on hand       REFERRAL TO PHARMACIST     Referral to the pharmacist: Not needed      [facility]     Shipping address confirmed in Epic.     Delivery Scheduled: Yes, Expected medication delivery date: 03/12/22.     Medication will be delivered via Next Day Courier to the prescription address in Epic WAM.    [name]   [facility] Specialty Technician

## 2022-03-11 MED FILL — CALQUENCE (ACALABRUTINIB MALEATE) 100 MG TABLET: ORAL | 30 days supply | Qty: 60 | Fill #1

## 2022-04-01 NOTE — Unmapped (Signed)
[facility] Clinical Assessment & Refill Coordination Note    Christian Abbott, DOB: 06/19/1935  Phone: [phone] (home)     All above HIPAA information was verified with patient.     Was a Nurse, learning disability used for this call? No    Specialty Medication(s):   Hematology/Oncology: Calquence     Current Outpatient Medications   Medication Sig Dispense Refill   ??? acalabrutinib (CALQUENCE) 100 mg capsule Take 1 capsule (100 mg total) by mouth Two (2) times a day. Do not break, open, or chew capsules. (Patient not taking: Reported on 11/16/2021) 60 capsule 5   ??? acalabrutinib (CALQUENCE) 100 mg tablet Take 1 tablet (100 mg total) by mouth Two (2) times a day. Swallow whole with water. Do not chew, crush, dissolve, or cut tablets. 60 tablet 5   ??? ACCU-CHEK AVIVA PLUS TEST STRP Strp      ??? ACCU-CHEK GUIDE ME GLUCOSE MTR Misc      ??? ACCU-CHEK SOFTCLIX LANCETS lancets      ??? albuterol 2.5 mg /3 mL (0.083 %) Nebu 3 mL, albuterol 5 mg/mL Nebu 0.5 mL Inhale 2.5 mg. (Patient not taking: Reported on 12/18/2021)     ??? albuterol 2.5 mg/0.5 mL nebulizer solution Inhale 0.5 mL (2.5 mg total). (Patient not taking: Reported on 12/18/2021)     ??? amLODIPine (NORVASC) 10 MG tablet Take 1 tablet (10 mg total) by mouth daily. (Patient not taking: Reported on 02/01/2022)     ??? amLODIPine-atorvastatin (CADUET) 10-10 mg per tablet  (Patient not taking: Reported on 02/01/2022)     ??? atorvastatin (LIPITOR) 80 MG tablet Take 1 tablet (80 mg total) by mouth.     ??? bumetanide (BUMEX) 1 MG tablet Take 1 tablet (1 mg total) by mouth daily.     ??? chlorhexidine (PERIDEX) 0.12 % solution RINSE 15 ML S ONCE DAILY AFTER BREAKFAST FOLLOWING BRUSHING AND FLOSSING     ??? cholecalciferol, vitamin D3, 5,000 unit Tab Take 1 tablet (125 mcg total) by mouth.     ??? clopidogrel (PLAVIX) 75 mg tablet Take 1 tablet (75 mg total) by mouth.     ??? cyanocobalamin, vitamin B-12, 1,000 mcg TbER Take 1 tablet by mouth.     ??? dexAMETHasone (DECADRON) 6 MG tablet      ??? doxazosin (CARDURA) 4 MG tablet Take 1 tablet (4 mg total) by mouth nightly.  1   ??? famotidine (PEPCID) 40 MG tablet Take 1 tablet (40 mg total) by mouth.     ??? ferrous sulfate 15 mg iron/1.5 mL Susp Take 1 tablet by mouth. (Patient not taking: Reported on 11/16/2021)     ??? furosemide (LASIX) 20 MG tablet Take 2 tablets (40 mg total) by mouth.     ??? glipiZIDE (GLUCOTROL XL) 2.5 MG 24 hr tablet Take 1 tablet (2.5 mg total) by mouth daily.     ??? glipiZIDE (GLUCOTROL XL) 5 MG 24 hr tablet Take 1 tablet (5 mg total) by mouth daily.     ??? glucosamine/chondr su A sod (OSTEO BI-FLEX ORAL) Take by mouth.     ??? hydrALAZINE (APRESOLINE) 50 MG tablet Take 1 tablet (50 mg total) by mouth Three (3) times a day.     ??? ipratropium-albuterol (DUO-NEB) 0.5-2.5 mg/3 mL nebulizer GIVE 3 ML TWICE DAILY INHALATION 90 DAYS  3   ??? JARDIANCE 25 mg tablet Take 1 tablet (25 mg total) by mouth daily. Pt reports he is now taking 10mg      ???  JENTADUETO 2.5-500 mg Tab      ??? liraglutide (VICTOZA 2-PAK) 0.6 mg/0.1 mL (18 mg/3 mL) injection      ??? LIRAGLUTIDE SUBQ Inject 1.8 mg under the skin.     ??? losartan (COZAAR) 50 MG tablet Take 1 tablet (50 mg total) by mouth daily. (Patient not taking: Reported on 02/01/2022)  0   ??? magnesium oxide (MAG-OX) 400 mg (241.3 mg elemental magnesium) tablet Take 1 tablet by mouth daily. (Patient not taking: Reported on 02/01/2022)     ??? metoprolol succinate (TOPROL-XL) 50 MG 24 hr tablet Take 1 tablet (50 mg total) by mouth. Pt reports he is taking 25mg  daily     ??? mometasone-formoterol 200-5 mcg/actuation HFAA Inhale 2 puffs two (2) times a day.     ??? spironolactone (ALDACTONE) 25 MG tablet Take 1 tablet (25 mg total) by mouth every other day. (Patient not taking: Reported on 02/01/2022)     ??? sucralfate (CARAFATE) 100 mg/mL suspension TAKE 10 ML (1 G TOTAL) BY MOUTH TWO (2) TIMES A DAY. TAKE 3 HOURS AFTER TAKING ACALBRUTINIB     ??? TRUE METRIX LEVEL 2 Soln      ??? VICTOZA 3-PAK 0.6 mg/0.1 mL (18 mg/3 mL) injection 0.3 mL (1.8 mg total).  2   ??? vit C-bioflav-hesp-rutin-hb196 (BIOFLEX) 500-50-25-40 mg Tab Take by mouth.     ??? zafirlukast (ACCOLATE) 20 MG tablet Take 1 tablet (20 mg total) by mouth.       No current facility-administered medications for this visit.     Facility-Administered Medications Ordered in Other Visits   Medication Dose Route Frequency Provider Last Rate Last Admin   ??? ferric carboxymaltose 750 mg in sodium chloride (NS) 0.9 % 250 mL IVPB  750 mg Intravenous Once Domingo Dimes, MD            Changes to medications: Vincent reports no changes at this time.    No Known Allergies    Changes to allergies: No    SPECIALTY MEDICATION ADHERENCE     Calquence  100 mg: 10 days of medicine on hand     Medication Adherence    Patient reported X missed doses in the last month: 0  Specialty Medication: Calquence 100mg           Specialty medication(s) dose(s) confirmed: Regimen is correct and unchanged.     Are there any concerns with adherence? No    Adherence counseling provided? Not needed    CLINICAL MANAGEMENT AND INTERVENTION      Clinical Benefit Assessment:    Do you feel the medicine is effective or helping your condition? Yes    Clinical Benefit counseling provided? Not needed    Adverse Effects Assessment:    Are you experiencing any side effects? No    Are you experiencing difficulty administering your medicine? No    Quality of Life Assessment:    Quality of Life      Oncology  1. What impact has your specialty medication had on the reduction of your daily pain or discomfort level?: None  2. On a scale of 1-10, how would you rate your ability to manage side effects associated with your specialty medication? (1=no issues, 10 = unable to take medication due to side effects): 1            How many days over the past month did your condition/medication  keep you from your normal activities? For example, brushing your teeth or getting up in the  morning. 0    Have you discussed this with your provider? Not needed    Acute Infection Status:    Acute infections noted within Epic:  No active infections  Patient reported infection: None    Therapy Appropriateness:    Is therapy appropriate and patient progressing towards therapeutic goals? Yes, therapy is appropriate and should be continued    DISEASE/MEDICATION-SPECIFIC INFORMATION      N/A    PATIENT SPECIFIC NEEDS     - Does the patient have any physical, cognitive, or cultural barriers? No    - Is the patient high risk? Yes, patient is taking oral chemotherapy. Appropriateness of therapy as been assessed    - Does the patient require a Care Management Plan? No     SOCIAL DETERMINANTS OF HEALTH     At the [facility], we have learned that life circumstances - like trouble affording food, housing, utilities, or transportation can affect the health of many of our patients.   That is why we wanted to ask: are you currently experiencing any life circumstances that are negatively impacting your health and/or quality of life? No    Social Determinants of Psychologist, prison and probation services Strain: Not on file   Internet Connectivity: Not on file   Food Insecurity: Not on file   Tobacco Use: Low Risk    ??? Smoking Tobacco Use: Never   ??? Smokeless Tobacco Use: Never   ??? Passive Exposure: Not on file   Housing/Utilities: Not on file   Alcohol Use: Not on file   Transportation Needs: Not on file   Substance Use: Not on file   Health Literacy: Not on file   Physical Activity: Not on file   Interpersonal Safety: Not on file   Stress: Not on file   Intimate Partner Violence: Not on file   Depression: Not on file   Social Connections: Not on file       Would you be willing to receive help with any of the needs that you have identified today? No       SHIPPING     Specialty Medication(s) to be Shipped:   Hematology/Oncology: Calquence    Other medication(s) to be shipped: No additional medications requested for fill at this time     Changes to insurance: No    Delivery Scheduled: Yes, Expected medication delivery date: 04/08/22.     Medication will be delivered via Next Day Courier to the confirmed prescription address in [facility].    The patient will receive a drug information handout for each medication shipped and additional FDA Medication Guides as required.  Verified that patient has previously received a Conservation officer, historic buildings and a Surveyor, mining.    The patient or caregiver noted above participated in the development of this care plan and knows that they can request review of or adjustments to the care plan at any time.      All of the patient's questions and concerns have been addressed.    Rollen Sox   [facility] Specialty Pharmacist

## 2022-04-06 DIAGNOSIS — C9110 Chronic lymphocytic leukemia of B-cell type not having achieved remission: Principal | ICD-10-CM

## 2022-04-07 ENCOUNTER — Ambulatory Visit: Admit: 2022-04-07 | Discharge: 2022-04-07 | Payer: MEDICARE

## 2022-04-07 DIAGNOSIS — D801 Nonfamilial hypogammaglobulinemia: Principal | ICD-10-CM

## 2022-04-07 DIAGNOSIS — C9110 Chronic lymphocytic leukemia of B-cell type not having achieved remission: Principal | ICD-10-CM

## 2022-04-07 LAB — CBC W/ AUTO DIFF
BASOPHILS ABSOLUTE COUNT: 0.1 10*9/L (ref 0.0–0.1)
BASOPHILS RELATIVE PERCENT: 1 %
EOSINOPHILS ABSOLUTE COUNT: 0.1 10*9/L (ref 0.0–0.5)
EOSINOPHILS RELATIVE PERCENT: 1.3 %
HEMATOCRIT: 34.6 % — ABNORMAL LOW (ref 39.0–48.0)
HEMOGLOBIN: 11.2 g/dL — ABNORMAL LOW (ref 12.9–16.5)
LYMPHOCYTES ABSOLUTE COUNT: 2.9 10*9/L (ref 1.1–3.6)
LYMPHOCYTES RELATIVE PERCENT: 41.8 %
MEAN CORPUSCULAR HEMOGLOBIN CONC: 32.4 g/dL (ref 32.0–36.0)
MEAN CORPUSCULAR HEMOGLOBIN: 24.8 pg — ABNORMAL LOW (ref 25.9–32.4)
MEAN CORPUSCULAR VOLUME: 76.3 fL — ABNORMAL LOW (ref 77.6–95.7)
MEAN PLATELET VOLUME: 8.3 fL (ref 6.8–10.7)
MONOCYTES ABSOLUTE COUNT: 0.6 10*9/L (ref 0.3–0.8)
MONOCYTES RELATIVE PERCENT: 8.9 %
NEUTROPHILS ABSOLUTE COUNT: 3.2 10*9/L (ref 1.8–7.8)
NEUTROPHILS RELATIVE PERCENT: 47 %
PLATELET COUNT: 119 10*9/L — ABNORMAL LOW (ref 150–450)
RED BLOOD CELL COUNT: 4.53 10*12/L (ref 4.26–5.60)
RED CELL DISTRIBUTION WIDTH: 22.3 % — ABNORMAL HIGH (ref 12.2–15.2)
WBC ADJUSTED: 6.9 10*9/L (ref 3.6–11.2)

## 2022-04-07 LAB — COMPREHENSIVE METABOLIC PANEL
ALBUMIN: 3.4 g/dL — ABNORMAL LOW (ref 3.5–5.0)
ALKALINE PHOSPHATASE: 133 U/L — ABNORMAL HIGH (ref 46–116)
ALT (SGPT): 20 U/L (ref 12–78)
ANION GAP: 6 mmol/L (ref 3–11)
AST (SGOT): 13 U/L — ABNORMAL LOW (ref 15–40)
BILIRUBIN TOTAL: 0.4 mg/dL (ref 0.2–1.0)
BLOOD UREA NITROGEN: 44 mg/dL — ABNORMAL HIGH (ref 8–20)
BUN / CREAT RATIO: 25
CALCIUM: 9 mg/dL (ref 8.5–10.1)
CHLORIDE: 104 mmol/L (ref 98–107)
CO2: 32.7 mmol/L — ABNORMAL HIGH (ref 21.0–32.0)
CREATININE: 1.79 mg/dL — ABNORMAL HIGH (ref 0.80–1.30)
EGFR CKD-EPI (2021) MALE: 37 mL/min/{1.73_m2} — ABNORMAL LOW (ref >=60)
GLUCOSE RANDOM: 230 mg/dL — ABNORMAL HIGH (ref 70–179)
POTASSIUM: 3.9 mmol/L (ref 3.5–5.0)
PROTEIN TOTAL: 6 g/dL (ref 6.0–8.0)
SODIUM: 143 mmol/L (ref 135–145)

## 2022-04-07 LAB — IGG: GAMMAGLOBULIN; IGG: 170 mg/dL — ABNORMAL LOW (ref 646–2013)

## 2022-04-07 LAB — IRON & TIBC
IRON SATURATION: 11 % — ABNORMAL LOW (ref 20–55)
IRON: 28 ug/dL — ABNORMAL LOW (ref 50–150)
TOTAL IRON BINDING CAPACITY: 249 ug/dL — ABNORMAL LOW (ref 250–450)

## 2022-04-07 LAB — SLIDE REVIEW

## 2022-04-07 LAB — FERRITIN: FERRITIN: 85 ng/mL (ref 10.5–307.3)

## 2022-04-07 MED ADMIN — sodium chloride (NS) 0.9 % infusion: 20 mL/h | INTRAVENOUS | @ 13:00:00 | Stop: 2022-04-07

## 2022-04-07 MED ADMIN — acetaminophen (TYLENOL) tablet 650 mg: 650 mg | ORAL | @ 13:00:00 | Stop: 2022-04-07

## 2022-04-07 MED ADMIN — immun glob G(IgG)-pro-IgA 0-50 (PRIVIGEN) 10 % intravenous solution 5 g: 5 g | INTRAVENOUS | @ 15:00:00 | Stop: 2022-04-07

## 2022-04-07 MED ADMIN — diphenhydrAMINE (BENADRYL) capsule/tablet 25 mg: 25 mg | ORAL | @ 13:00:00 | Stop: 2022-04-07

## 2022-04-07 MED ADMIN — immun glob G(IgG)-pro-IgA 0-50 (PRIVIGEN) 10 % intravenous solution 20 g: 20 g | INTRAVENOUS | @ 14:00:00 | Stop: 2022-04-07

## 2022-04-07 MED FILL — CALQUENCE (ACALABRUTINIB MALEATE) 100 MG TABLET: ORAL | 30 days supply | Qty: 60 | Fill #2

## 2022-04-07 NOTE — Unmapped (Signed)
Latest Reference Range & Units 02/01/22 10:07 04/07/22 07:50   WBC 3.6 - 11.2 10*9/L 6.8 6.9   RBC 4.26 - 5.60 10*12/L 3.59 (L) 4.53   HGB 12.9 - 16.5 g/dL 8.3 (L) 16.1 (L)   HCT 39.0 - 48.0 % 26.4 (L) 34.6 (L)   MCV 77.6 - 95.7 fL 73.6 (L) 76.3 (L)   MCH 25.9 - 32.4 pg 23.0 (L) 24.8 (L)   MCHC 32.0 - 36.0 g/dL 09.6 (L) 04.5   RDW 40.9 - 15.2 % 15.2 22.3 (H)   MPV 6.8 - 10.7 fL 7.9 8.3   Platelet 150 - 450 10*9/L 117 (L) 119 (L)   Neutrophils % % 36.9 47.0   Lymphocytes % % 53.8 41.8   Monocytes % % 7.7 8.9   Eosinophils % % 0.8 1.3   Basophils % % 0.8 1.0   Absolute Neutrophils 1.8 - 7.8 10*9/L 2.5 3.2   Absolute Lymphocytes 1.1 - 3.6 10*9/L 3.7 (H) 2.9   Absolute Monocytes  0.3 - 0.8 10*9/L 0.5 0.6   Absolute Eosinophils 0.0 - 0.5 10*9/L 0.1 0.1   Absolute Basophils  0.0 - 0.1 10*9/L 0.1 0.1   Microcytosis Not Present  Slight !    Anisocytosis Not Present   Marked !   Hypochromasia Not Present  Marked ! Moderate !   Ovalocytes   See Comment   Neutrophil Left Shift Not Present   Present !   Tear Drop Cells   See Comment   Reticulocyte Auto % 0.50 - 1.70 % 2.40 (H)    Absolute Auto Reticulocyte 23.0 - 80.0 10*9/L 88.1 (H)    Sodium 135 - 145 mmol/L 142 143   Potassium 3.5 - 5.0 mmol/L 3.7 3.9   Chloride 98 - 107 mmol/L 103 104   CO2 21.0 - 32.0 mmol/L 30.2 32.7 (H)   Bun 8 - 20 mg/dL 45 (H) 44 (H)   Creatinine 0.80 - 1.30 mg/dL 8.11 (H) 9.14 (H)   BUN/Creatinine Ratio  26 25   eGFR CKD-EPI (2021) Male >=60 mL/min/1.2m2 38 (L) 37 (L)   Anion Gap 3 - 11 mmol/L 9 6   Glucose 70 - 179 mg/dL 782 (H) 956 (H)   Calcium 8.5 - 10.1 mg/dL 8.8 9.0   Albumin 3.5 - 5.0 g/dL 3.3 (L) 3.4 (L)   Total Protein 6.0 - 8.0 g/dL 6.1 6.0   Total Bilirubin 0.2 - 1.0 mg/dL 0.4 0.4   SGOT (AST) 15 - 40 U/L 15 13 (L)   ALT 12 - 78 U/L 24 20   Alkaline Phosphatase 46 - 116 U/L 127 (H) 133 (H)   Iron 50 - 150 ug/dL 25 (L) 28 (L)   TIBC 213 - 450 ug/dL 086 578 (L)   Iron Saturation (%) 20 - 55 % 8 (L) 11 (L)   Ferritin 10.5 - 307.3 ng/mL 17.0 Folate ng/mL >48.0    Vitamin B-12 211 - 911 pg/ml 402    (L): Data is abnormally low  (H): Data is abnormally high  !: Data is abnormal

## 2022-04-07 NOTE — Unmapped (Signed)
Infusion completed, tolerated well, VSS.  Patient provided blankets, drinks, and snacks as needed throughout infusion.  Comfort continually assessed by staff.  PIV de-accessed per Scott protocol. Patient discharged in stable condition, ambulatory, using cane, family at chairside.  Return to clinic information provided.

## 2022-04-07 NOTE — Unmapped (Signed)
HEMATOLOGY ONCOLOGY CLINIC:  INTERVAL FOLLOW UP  NOTE    REFERRING PHYSICIAN:  Elyse Hsu, MD  PCP:  Laurence Spates, MD    DATE OF ENCOUNTER:  04/07/2022    INTERVAL HISTORY:  Christian Abbott is an 87 y.o. male who comes today for continued follow-up of chronic lymphocytic leukemia.     Given his advanced disease he was started on acalbrutinib 12/19/20. He is accompanied once again by his very supportive daughter today.     He followed up with his cardiologist due to increasing shortness of breath in early October 2022.  On 06/02/2021 chest x-ray showed similar enlargement of the cardio pericardial silhouette and prominence of the central pulmonary vasculature which was suggestive of possible pulmonary vascular congestion.  A BNP level at that time was elevated 658. He was started on lasix and  Dose is actively being managed by cardiology.      On 08/13/21 he had a follow up CT chest which showed improved aeration and stable to improved mediastinal LAD.    He was recently started on Jardiance and is being followed by nephrology.      He is here today for a routine follow-up visit and to receive his IVIG which is being given every 3 months.     Patient was recently noted to have a worsening iron deficiency anemia and was given 2 doses of Feraheme in late June 2023 for total dose of 1020 mg..    Reports that he has had some improvement in fatigue since his IV iron administration although continues to walk slowly.  Daughter reports that this may be related to his osteoarthritis in his knees.     He is tolerating acalabrutinib fairly well.  Today his ALC has normalized to 2.9.  Hemoglobin is improved from 8.3 on 02/01/2022 --> 11.2 today.  Platelets are stable at 118.        ONCOLOGY HISTORY  Oncology History Overview Note   87 y/o male with long-standing history of chronic lymphocytic leukemia initially diagnosed in 1998  Status post Rituxan based therapy for 8 weeks during that time  History of anemia treated with Procrit off and on  History of hypertension diabetes asthma hypercholesterolemia.  History of hospitalization in January 2014 for bilateral pneumonia [facility] for 3 weeks.     CLL (chronic lymphocytic leukemia) (CMS-HCC)   07/18/1997 Initial Diagnosis    CLL (chronic lymphocytic leukemia)           PAST MEDICAL HISTORY:     Patient Active Problem List   Diagnosis    CLL (chronic lymphocytic leukemia) (CMS-HCC)    Iron deficiency anemia    Hypogammaglobulinemia (CMS-HCC)    Anemia    Need for prophylactic immunotherapy      has no past surgical history on file.    ALLERGIES:  has No Known Allergies.      MEDICATIONS: has a current medication list which includes the following prescription(s): acalabrutinib, accu-chek aviva plus test strp, accu-chek guide me glucose mtr, accu-chek softclix lancets, atorvastatin, bumetanide, chlorhexidine, cholecalciferol (vitamin d3-125 mcg (5,000 unit)), clopidogrel, cyanocobalamin (vitamin b-12), dexamethasone, doxazosin, famotidine, furosemide, glipizide, glipizide, glucosamine/chondr su a sod, hydralazine, ipratropium-albuterol, jardiance, jentadueto, victoza 2-pak, liraglutide, metoprolol succinate, mometasone-formoterol, sucralfate, true metrix level 2, victoza 3-pak, bioflex, zafirlukast, acalabrutinib, albuterol 2.5 mg /3 mL (0.083 %) Nebu 3 mL, albuterol 5 mg/mL Nebu 0.5 mL, albuterol, amlodipine, amlodipine-atorvastatin, ferrous sulfate, losartan, magnesium oxide, and spironolactone, and the following Facility-Administered Medications: acetaminophen, diphenhydramine, ferric carboxymaltose  750 mg in sodium chloride (NS) 0.9 % 250 mL IVPB, immun glob g(igg)-pro-iga 0-50 **FOLLOWED BY** immun glob g(igg)-pro-iga 0-50, sodium chloride.    FAMILY HISTORY / SOCIAL HISTORY:  Reviewed and updated as appropriate.    REVIEW OF SYSTEMS:  As per interval history.  All other systems reviewed and negative.    PHYSICAL EXAM    Vitals: BP 134/63  - Pulse 61  - Temp 36.4 ??C (97.5 ??F) (Temporal)  - Resp 16  - Ht 165.1 cm (5' 5)  - Wt 68.9 kg (151 lb 12.8 oz)  - SpO2 96%  - BMI 25.26 kg/m??   General:  Pleasant, no acute distress.    Pain:  Pain Evaluation:                                   Pain Score (0 - 1):                                Pain Location:                                       Eyes:  Appears normal.   HENT:  Atraumatic.   Neck:  thyroid midline.  Lymphatics: Bilateral small cervical lymph nodes.  No supraclavicular axillary or inguinal lymphadenopathy   Cardiovascular:   RRR, trace edema b/l ankles.   Respiratory:  Unlabored.  Good air entry b/l. No wheezing.  CTABL   Gastrointestinal: Does not appear distended.  Nontender. No palpable HSM.   Skin:  No rashes or subcutaneous nodules.    Musculoskeletal:  Gait is steady     Psychiatric:  Affect appropriate. Judgment and insight nl.  Neurologic:  Alert and oriented x3. Grossly non-focal.              RECENT LABS/PATHOLOGY:  Results for orders placed or performed in visit on 04/07/22   Comprehensive Metabolic Panel   Result Value Ref Range    Sodium 143 135 - 145 mmol/L    Potassium 3.9 3.5 - 5.0 mmol/L    Chloride 104 98 - 107 mmol/L    CO2 32.7 (H) 21.0 - 32.0 mmol/L    Anion Gap 6 3 - 11 mmol/L    BUN 44 (H) 8 - 20 mg/dL    Creatinine 9.62 (H) 0.80 - 1.30 mg/dL    BUN/Creatinine Ratio 25     eGFR CKD-EPI (2021) Male 37 (L) >=60 mL/min/1.17m2    Glucose 230 (H) 70 - 179 mg/dL    Calcium 9.0 8.5 - 95.2 mg/dL    Albumin 3.4 (L) 3.5 - 5.0 g/dL    Total Protein 6.0 6.0 - 8.0 g/dL    Total Bilirubin 0.4 0.2 - 1.0 mg/dL    AST 13 (L) 15 - 40 U/L    ALT 20 12 - 78 U/L    Alkaline Phosphatase 133 (H) 46 - 116 U/L   Iron & TIBC   Result Value Ref Range    Iron 28 (L) 50 - 150 ug/dL    TIBC 841 (L) 324 - 401 ug/dL    Iron Saturation (%) 11 (L) 20 - 55 %   CBC w/ Differential   Result Value Ref Range    Results Verified by Slide Scan Slide Reviewed  WBC 6.9 3.6 - 11.2 10*9/L    RBC 4.53 4.26 - 5.60 10*12/L    HGB 11.2 (L) 12.9 - 16.5 g/dL    HCT 96.2 (L) 95.2 - 48.0 %    MCV 76.3 (L) 77.6 - 95.7 fL    MCH 24.8 (L) 25.9 - 32.4 pg    MCHC 32.4 32.0 - 36.0 g/dL    RDW 84.1 (H) 32.4 - 15.2 %    MPV 8.3 6.8 - 10.7 fL    Platelet 119 (L) 150 - 450 10*9/L    Neutrophils % 47.0 %    Lymphocytes % 41.8 %    Monocytes % 8.9 %    Eosinophils % 1.3 %    Basophils % 1.0 %    Absolute Neutrophils 3.2 1.8 - 7.8 10*9/L    Absolute Lymphocytes 2.9 1.1 - 3.6 10*9/L    Absolute Monocytes 0.6 0.3 - 0.8 10*9/L    Absolute Eosinophils 0.1 0.0 - 0.5 10*9/L    Absolute Basophils 0.1 0.0 - 0.1 10*9/L    Anisocytosis Marked (A) Not Present    Hypochromasia Moderate (A) Not Present   Morphology Review   Result Value Ref Range    Ovalocytes      Tear Drop Cells      Neutrophil Left Shift Present (A) Not Present     RADIOLOGY      08/13/21 CT chest   IMPRESSION:  1. Overall improved aeration in the bilateral lungs, suggesting improved infectious or inflammatory pneumonitis since prior examination. Persistent linear and subpleural reticular opacities bilaterally, suggestive of residual disease.  2. Scattered micronodules bilaterally, unchanged since recent prior examinations. No new, or enlarging nodules are noted. Recommend follow-up examination in 6-12 months based on Fleischner Society criteria.  3. Mediastinal and hilar lymph nodes appear relatively unchanged to slightly improved in the interval. No new or enlarging adenopathy.    ASSESSMENT and PLAN:    # Chronic lymphocytic leukemia  --Rai stage IV CLL  --CLL FISH panel shows 13 q. deletion 21.5%, IGHV mutated -good prognostic features  --11/05/2020: TP53 mutation negative.  NOTCH1 negative.  SF3B1 positive.  SF3B1 positivity indicates poor prognosis.  -- no evidence of hemolysis.   --Indication for treatment:  advanced stage and history of thrombocytopenia  -- He has been treated with Rituxan many years ago by Dr Thedore Mins, with good response in the 1990s. Treatment indication at that time may have been frequent infections.  -- started acabrutinib 12/19/20  -- Reviewed cbc CMP.  ALC has now normalized at 2.9.  Hemoglobin has improved since IV iron administration few weeks ago.  Hemoglobin is now 11.2 g/dL.  Platelets are stable at 119.   -- Based on labs exam and  ROS patient appears to be stable. no clinical evidence of progression of disease.    PLAN:  -- continue Acalbrutinib 100 mg BID.   -- I have recommended follow-up labs and appointment to see me in 6 weeks.         # Patchy lung opacities  -- most recent CT chest from 05/16/2021 mixed response with regardin the GGOs.  He continues to have no infectious symptoms.  Patient is we would expect after his COVID diagnosis.  He followed up with Dr. Virgia Land recently.  Findings on his CT chest are felt to represent volume overload as noted by a recent elevation in proBNP on 06/02/2021.  No pulmonary intervention was recommended by Dr. Virgia Land at this time.  --follow up CT chest 08/13/21  showed overall improved aeration. Small indeterminate micronodules at stbale and per radiology require 6-12 month follow up .   -- We will continue to monitor cliically and repeat CT chest in  In 07/2022 - or sooner if clinically indicated.  Will order at next visit.   -- he will continue following with Dr Ace Gins.     #Iron deficiency anemia  -- He was noted to have iron deficiency in 2014 and has since received IV iron in the past and has been on oral iron 1 pill/day.   -- iron deficiency anemia/ACD in 07/2019 with hb down to 8.7 and ferritin 78, iron sat 12%  -- now s/p 1020 mg ferheme 07/2019 and early 10/2018 with hb improvement to 10.9 today.   --EGD and colonoscopy with Dr. Loreta Ave 01/2020 revealed bleeding gastric ulcer, smaller antral ulcer with old blood. .  Currently being managed with sucralfate.  -- hb down to 8.3 today.   -- iron panel and feritin c/with IV iron. Ordered feraheme x 2 doses 1 week apart.     # Hypogammaglobulinemia   --History level continues to be low however he has not had any more infections since we started him on IVIG. Before we started him on IVIG he has had multiple hospitalizations for pneumonias.  --Therefore we will continue IVIG for now every 3 months as he is tolerated this well.  --  next IVIG schedule for  today.          # Health maintenance:  --  Evusheld  #1 12/04/20   -- bivalent booster  06/01/21  -- influenza vaccine 06/01/21  --evusheld  #2 07/01/21          # diastolic CHF with volume overload  -- followed closely by Cardiology.recently switched to bumex.   -- diuresis has bee associated with some degree of renal dysfunction in the last 5-6 months  -- he will continue following closely with Cardiology    # CKD Stage IV  -- Following closely with [facility].  -- Cr stable today at 1.79       DISPO:  RTC in  6 weeks for labs and MD vist   RTC in 12 weeks for visit and IVIG  .     I have independently reviewed notes from prior visits, results from labs and imaging.  The current plan of care requires intensive monitoring for toxicity due to use of antineoplastic treatments.  Medical decision making was of high complexity due to to ongoing risk of morbidity/mortality from antineoplastic treatment.      Elyse Hsu, MD  [facility]

## 2022-04-12 MED ORDER — SUCRALFATE 100 MG/ML ORAL SUSPENSION
Freq: Two times a day (BID) | ORAL | 0 refills | 21 days | Status: CP
Start: 2022-04-12 — End: ?

## 2022-04-28 NOTE — Unmapped (Signed)
[facility] Refill Coordination Note    Specialty Medication(s) to be Shipped:   Hematology/Oncology: Calquence 100mg     Other medication(s) to be shipped: No additional medications requested for fill at this time     Christian Abbott, DOB: 11-Apr-1935  Phone: [phone] (home)       All above HIPAA information was verified with patient's daughter     Was a Nurse, learning disability used for this call? No    Completed refill call assessment today to schedule patient's medication shipment from the [facility] [phone]).  All relevant notes have been reviewed.     Specialty medication(s) and dose(s) confirmed: Regimen is correct and unchanged.   Changes to medications: Christian Abbott reports no changes at this time.  Changes to insurance: No  New side effects reported not previously addressed with a pharmacist or physician: None reported  Questions for the pharmacist: No    Confirmed patient received a Conservation officer, historic buildings and a Surveyor, mining with first shipment. The patient will receive a drug information handout for each medication shipped and additional FDA Medication Guides as required.       DISEASE/MEDICATION-SPECIFIC INFORMATION        N/A    SPECIALTY MEDICATION ADHERENCE     Medication Adherence    Patient reported X missed doses in the last month: 0  Specialty Medication: CALQUENCE 100 mg tablet (acalabrutinib)  Patient is on additional specialty medications: No  Informant: child/children                          Were doses missed due to medication being on hold? No    Calquence 100 mg: 10 days of medicine on hand       REFERRAL TO PHARMACIST     Referral to the pharmacist: Not needed      [facility]     Shipping address confirmed in Epic.     Delivery Scheduled: Yes, Expected medication delivery date: 05/04/22.     Medication will be delivered via Same Day Courier to the prescription address in Epic WAM.    [name]   [facility] Specialty Technician

## 2022-05-03 MED ORDER — SUCRALFATE 100 MG/ML ORAL SUSPENSION
Freq: Two times a day (BID) | ORAL | 0 refills | 0 days
Start: 2022-05-03 — End: ?

## 2022-05-03 MED FILL — CALQUENCE (ACALABRUTINIB MALEATE) 100 MG TABLET: ORAL | 30 days supply | Qty: 60 | Fill #3

## 2022-05-04 MED ORDER — SUCRALFATE 100 MG/ML ORAL SUSPENSION
Freq: Two times a day (BID) | ORAL | 0 refills | 21 days | Status: CP
Start: 2022-05-04 — End: ?

## 2022-05-19 DIAGNOSIS — C9110 Chronic lymphocytic leukemia of B-cell type not having achieved remission: Principal | ICD-10-CM

## 2022-05-19 DIAGNOSIS — D801 Nonfamilial hypogammaglobulinemia: Principal | ICD-10-CM

## 2022-05-20 ENCOUNTER — Ambulatory Visit: Admit: 2022-05-20 | Discharge: 2022-05-21 | Payer: MEDICARE

## 2022-05-20 LAB — CBC W/ AUTO DIFF
BASOPHILS ABSOLUTE COUNT: 0 10*9/L (ref 0.0–0.1)
BASOPHILS RELATIVE PERCENT: 0.8 %
EOSINOPHILS ABSOLUTE COUNT: 0 10*9/L (ref 0.0–0.5)
EOSINOPHILS RELATIVE PERCENT: 0.5 %
HEMATOCRIT: 29.9 % — ABNORMAL LOW (ref 39.0–48.0)
HEMOGLOBIN: 9.4 g/dL — ABNORMAL LOW (ref 12.9–16.5)
LYMPHOCYTES ABSOLUTE COUNT: 2.3 10*9/L (ref 1.1–3.6)
LYMPHOCYTES RELATIVE PERCENT: 40.2 %
MEAN CORPUSCULAR HEMOGLOBIN CONC: 31.3 g/dL — ABNORMAL LOW (ref 32.0–36.0)
MEAN CORPUSCULAR HEMOGLOBIN: 24.1 pg — ABNORMAL LOW (ref 25.9–32.4)
MEAN CORPUSCULAR VOLUME: 76.9 fL — ABNORMAL LOW (ref 77.6–95.7)
MEAN PLATELET VOLUME: 7.7 fL (ref 6.8–10.7)
MONOCYTES ABSOLUTE COUNT: 0.5 10*9/L (ref 0.3–0.8)
MONOCYTES RELATIVE PERCENT: 9.2 %
NEUTROPHILS ABSOLUTE COUNT: 2.8 10*9/L (ref 1.8–7.8)
NEUTROPHILS RELATIVE PERCENT: 49.3 %
PLATELET COUNT: 120 10*9/L — ABNORMAL LOW (ref 150–450)
RED BLOOD CELL COUNT: 3.9 10*12/L — ABNORMAL LOW (ref 4.26–5.60)
RED CELL DISTRIBUTION WIDTH: 15.8 % — ABNORMAL HIGH (ref 12.2–15.2)
WBC ADJUSTED: 5.7 10*9/L (ref 3.6–11.2)

## 2022-05-20 LAB — COMPREHENSIVE METABOLIC PANEL
ALBUMIN: 3.1 g/dL — ABNORMAL LOW (ref 3.5–5.0)
ALKALINE PHOSPHATASE: 119 U/L — ABNORMAL HIGH (ref 46–116)
ALT (SGPT): 18 U/L (ref 12–78)
ANION GAP: 9 mmol/L (ref 3–11)
AST (SGOT): 16 U/L (ref 15–40)
BILIRUBIN TOTAL: 0.3 mg/dL (ref 0.2–1.0)
BLOOD UREA NITROGEN: 36 mg/dL — ABNORMAL HIGH (ref 8–20)
BUN / CREAT RATIO: 23
CALCIUM: 8.4 mg/dL — ABNORMAL LOW (ref 8.5–10.1)
CHLORIDE: 107 mmol/L (ref 98–107)
CO2: 30.2 mmol/L (ref 21.0–32.0)
CREATININE: 1.57 mg/dL — ABNORMAL HIGH (ref 0.80–1.30)
EGFR CKD-EPI (2021) MALE: 43 mL/min/{1.73_m2} — ABNORMAL LOW (ref >=60)
GLUCOSE RANDOM: 204 mg/dL — ABNORMAL HIGH (ref 70–179)
POTASSIUM: 3.3 mmol/L — ABNORMAL LOW (ref 3.5–5.0)
PROTEIN TOTAL: 5.6 g/dL — ABNORMAL LOW (ref 6.0–8.0)
SODIUM: 146 mmol/L — ABNORMAL HIGH (ref 135–145)

## 2022-05-20 LAB — FERRITIN: FERRITIN: 29 ng/mL (ref 10.5–307.3)

## 2022-05-20 LAB — IRON & TIBC
IRON SATURATION: 8 % — ABNORMAL LOW (ref 20–55)
IRON: 21 ug/dL — ABNORMAL LOW (ref 50–150)
TOTAL IRON BINDING CAPACITY: 268 ug/dL (ref 250–450)

## 2022-05-20 NOTE — Unmapped (Unsigned)
HEMATOLOGY ONCOLOGY CLINIC:  INTERVAL FOLLOW UP  NOTE    REFERRING PHYSICIAN:  Elyse Hsu, MD  PCP:  Arturo Morton, MD    DATE OF ENCOUNTER:  05/20/2022    INTERVAL HISTORY:  Christian Abbott is an 87 y.o. male who comes today for continued follow-up of chronic lymphocytic leukemia.     Given his advanced disease he was started on acalbrutinib 12/19/20. He is accompanied once again by his very supportive daughter today.     He followed up with his cardiologist due to increasing shortness of breath in early October 2022.  On 06/02/2021 chest x-ray showed similar enlargement of the cardio pericardial silhouette and prominence of the central pulmonary vasculature which was suggestive of possible pulmonary vascular congestion.  A BNP level at that time was elevated 658. He was started on lasix and  Dose is actively being managed by cardiology.      On 08/13/21 he had a follow up CT chest which showed improved aeration and stable to improved mediastinal LAD.    He was recently started on Jardiance and is being followed by nephrology.      He is here today for a routine follow-up visit and to receive his IVIG which is being given every 3 months.     Patient was recently noted to have a worsening iron deficiency anemia and was given 2 doses of Feraheme in late June 2023 for total dose of 1020 mg..    Reports that he has had some improvement in fatigue since his IV iron administration although continues to walk slowly.  Daughter reports that this may be related to his osteoarthritis in his knees.     He is tolerating acalabrutinib fairly well.  Today his ALC has normalized to 2.9.  Hemoglobin is improved from 8.3 on 02/01/2022 --> 11.2 today.  Platelets are stable at 118.        ONCOLOGY HISTORY  Oncology History Overview Note   87 y/o male with long-standing history of chronic lymphocytic leukemia initially diagnosed in 1998  Status post Rituxan based therapy for 8 weeks during that time  History of anemia treated with Procrit off and on  History of hypertension diabetes asthma hypercholesterolemia.  History of hospitalization in January 2014 for bilateral pneumonia [facility] for 3 weeks.     CLL (chronic lymphocytic leukemia) (CMS-HCC)   07/18/1997 Initial Diagnosis    CLL (chronic lymphocytic leukemia)           PAST MEDICAL HISTORY:     Patient Active Problem List   Diagnosis    CLL (chronic lymphocytic leukemia) (CMS-HCC)    Iron deficiency anemia    Hypogammaglobulinemia (CMS-HCC)    Anemia    Need for prophylactic immunotherapy      has no past surgical history on file.    ALLERGIES:  has No Known Allergies.      MEDICATIONS: has a current medication list which includes the following prescription(s): acalabrutinib, accu-chek aviva plus test strp, accu-chek guide me glucose mtr, accu-chek softclix lancets, atorvastatin, bumetanide, chlorhexidine, cholecalciferol (vitamin d3-125 mcg (5,000 unit)), clopidogrel, cyanocobalamin (vitamin b-12), dexamethasone, doxazosin, famotidine, furosemide, glipizide, glipizide, glucosamine/chondr su a sod, hydralazine, ipratropium-albuterol, jardiance, jentadueto, victoza 2-pak, liraglutide, metoprolol succinate, mometasone-formoterol, sucralfate, true metrix level 2, victoza 3-pak, bioflex, zafirlukast, acalabrutinib, albuterol 2.5 mg /3 mL (0.083 %) Nebu 3 mL, albuterol 5 mg/mL Nebu 0.5 mL, albuterol, amlodipine, amlodipine-atorvastatin, ferrous sulfate, losartan, magnesium oxide, and spironolactone, and the following Facility-Administered Medications: ferric carboxymaltose 750 mg  in sodium chloride (NS) 0.9 % 250 mL IVPB.    FAMILY HISTORY / SOCIAL HISTORY:  Reviewed and updated as appropriate.    REVIEW OF SYSTEMS:  As per interval history.  All other systems reviewed and negative.    PHYSICAL EXAM    Vitals: BP 180/77  - Pulse 61  - Temp 36.5 ??C (97.7 ??F) (Temporal)  - Resp 17  - Ht 165.1 cm (5' 5)  - Wt 68 kg (149 lb 14.4 oz)  - SpO2 98%  - BMI 24.94 kg/m?? General:  Pleasant, no acute distress.    Pain:  Pain Evaluation:                                   Pain Score (0 - 1):                                Pain Location:                                       Eyes:  Appears normal.   HENT:  Atraumatic.   Neck:  thyroid midline.  Lymphatics: Bilateral small cervical lymph nodes.  No supraclavicular axillary or inguinal lymphadenopathy   Cardiovascular:   RRR, trace edema b/l ankles.   Respiratory:  Unlabored.  Good air entry b/l. No wheezing.  CTABL   Gastrointestinal: Does not appear distended.  Nontender. No palpable HSM.   Skin:  No rashes or subcutaneous nodules.    Musculoskeletal:  Gait is steady     Psychiatric:  Affect appropriate. Judgment and insight nl.  Neurologic:  Alert and oriented x3. Grossly non-focal.              RECENT LABS/PATHOLOGY:  Results for orders placed or performed in visit on 05/20/22   Comprehensive Metabolic Panel   Result Value Ref Range    Sodium 146 (H) 135 - 145 mmol/L    Potassium 3.3 (L) 3.5 - 5.0 mmol/L    Chloride 107 98 - 107 mmol/L    CO2 30.2 21.0 - 32.0 mmol/L    Anion Gap 9 3 - 11 mmol/L    BUN 36 (H) 8 - 20 mg/dL    Creatinine 2.95 (H) 0.80 - 1.30 mg/dL    BUN/Creatinine Ratio 23     eGFR CKD-EPI (2021) Male 43 (L) >=60 mL/min/1.60m2    Glucose 204 (H) 70 - 179 mg/dL    Calcium 8.4 (L) 8.5 - 10.1 mg/dL    Albumin 3.1 (L) 3.5 - 5.0 g/dL    Total Protein 5.6 (L) 6.0 - 8.0 g/dL    Total Bilirubin 0.3 0.2 - 1.0 mg/dL    AST 16 15 - 40 U/L    ALT 18 12 - 78 U/L    Alkaline Phosphatase 119 (H) 46 - 116 U/L   Iron & TIBC   Result Value Ref Range    Iron 21 (L) 50 - 150 ug/dL    TIBC 621 308 - 657 ug/dL    Iron Saturation (%) 8 (L) 20 - 55 %   Ferritin   Result Value Ref Range    Ferritin 29.0 10.5 - 307.3 ng/mL   CBC w/ Differential   Result Value Ref Range    WBC 5.7 3.6 -  11.2 10*9/L    RBC 3.90 (L) 4.26 - 5.60 10*12/L    HGB 9.4 (L) 12.9 - 16.5 g/dL    HCT 41.6 (L) 60.6 - 48.0 %    MCV 76.9 (L) 77.6 - 95.7 fL    MCH 24.1 (L) 25.9 - 32.4 pg    MCHC 31.3 (L) 32.0 - 36.0 g/dL    RDW 30.1 (H) 60.1 - 15.2 %    MPV 7.7 6.8 - 10.7 fL    Platelet 120 (L) 150 - 450 10*9/L    Neutrophils % 49.3 %    Lymphocytes % 40.2 %    Monocytes % 9.2 %    Eosinophils % 0.5 %    Basophils % 0.8 %    Absolute Neutrophils 2.8 1.8 - 7.8 10*9/L    Absolute Lymphocytes 2.3 1.1 - 3.6 10*9/L    Absolute Monocytes 0.5 0.3 - 0.8 10*9/L    Absolute Eosinophils 0.0 0.0 - 0.5 10*9/L    Absolute Basophils 0.0 0.0 - 0.1 10*9/L    Hypochromasia Marked (A) Not Present     RADIOLOGY      08/13/21 CT chest   IMPRESSION:  1. Overall improved aeration in the bilateral lungs, suggesting improved infectious or inflammatory pneumonitis since prior examination. Persistent linear and subpleural reticular opacities bilaterally, suggestive of residual disease.  2. Scattered micronodules bilaterally, unchanged since recent prior examinations. No new, or enlarging nodules are noted. Recommend follow-up examination in 6-12 months based on Fleischner Society criteria.  3. Mediastinal and hilar lymph nodes appear relatively unchanged to slightly improved in the interval. No new or enlarging adenopathy.    ASSESSMENT and PLAN:    # Chronic lymphocytic leukemia  --Rai stage IV CLL  --CLL FISH panel shows 13 q. deletion 21.5%, IGHV mutated -good prognostic features  --11/05/2020: TP53 mutation negative.  NOTCH1 negative.  SF3B1 positive.  SF3B1 positivity indicates poor prognosis.  -- no evidence of hemolysis.   --Indication for treatment:  advanced stage and history of thrombocytopenia  -- He has been treated with Rituxan many years ago by Dr Thedore Mins, with good response in the 1990s. Treatment indication at that time may have been frequent infections.  -- started acabrutinib 12/19/20  -- Reviewed cbc CMP.  ALC has now normalized at 2.9.  Hemoglobin has improved since IV iron administration few weeks ago.  Hemoglobin is now 11.2 g/dL.  Platelets are stable at 119.   -- Based on labs exam and  ROS patient appears to be stable. no clinical evidence of progression of disease.    PLAN:  -- continue Acalbrutinib 100 mg BID.   -- I have recommended follow-up labs and appointment to see me in 6 weeks.         # Patchy lung opacities  -- most recent CT chest from 05/16/2021 mixed response with regardin the GGOs.  He continues to have no infectious symptoms.  Patient is we would expect after his COVID diagnosis.  He followed up with Dr. Virgia Land recently.  Findings on his CT chest are felt to represent volume overload as noted by a recent elevation in proBNP on 06/02/2021.  No pulmonary intervention was recommended by Dr. Virgia Land at this time.  --follow up CT chest 08/13/21 showed overall improved aeration. Small indeterminate micronodules at stbale and per radiology require 6-12 month follow up .   -- We will continue to monitor cliically and repeat CT chest in  In 07/2022 - or sooner if clinically indicated.  Will order  at next visit.   -- he will continue following with Dr Ace Gins.     #Iron deficiency anemia  -- He was noted to have iron deficiency in 2014 and has since received IV iron in the past and has been on oral iron 1 pill/day.   -- iron deficiency anemia/ACD in 07/2019 with hb down to 8.7 and ferritin 78, iron sat 12%  -- now s/p 1020 mg ferheme 07/2019 and early 10/2018 with hb improvement to 10.9 today.   --EGD and colonoscopy with Dr. Loreta Ave 01/2020 revealed bleeding gastric ulcer, smaller antral ulcer with old blood. .  Currently being managed with sucralfate.  -- hb down to 8.3 today.   -- iron panel and feritin c/with IV iron. Ordered feraheme x 2 doses 1 week apart.     # Hypogammaglobulinemia   --History level continues to be low however he has not had any more infections since we started him on IVIG. Before we started him on IVIG he has had multiple hospitalizations for pneumonias.  --Therefore we will continue IVIG for now every 3 months as he is tolerated this well.  --  next IVIG schedule for  today. # Health maintenance:  --  Evusheld  #1 12/04/20   -- bivalent booster  06/01/21  -- influenza vaccine 06/01/21  --evusheld  #2 07/01/21          # diastolic CHF with volume overload  -- followed closely by Cardiology.recently switched to bumex.   -- diuresis has bee associated with some degree of renal dysfunction in the last 5-6 months  -- he will continue following closely with Cardiology    # CKD Stage IV  -- Following closely with [facility].  -- Cr stable today at 1.79       DISPO:  RTC in  6 weeks for labs and MD vist   RTC in 12 weeks for visit and IVIG  .     I have independently reviewed notes from prior visits, results from labs and imaging.  The current plan of care requires intensive monitoring for toxicity due to use of antineoplastic treatments.  Medical decision making was of high complexity due to to ongoing risk of morbidity/mortality from antineoplastic treatment.      Elyse Hsu, MD  [facility]

## 2022-05-20 NOTE — Unmapped (Signed)
You labs from today:     Latest Reference Range & Units 05/20/22 12:15   WBC 3.6 - 11.2 10*9/L 5.7   RBC 4.26 - 5.60 10*12/L 3.90 (L)   HGB 12.9 - 16.5 g/dL 9.4 (L)   HCT 16.1 - 09.6 % 29.9 (L)   MCV 77.6 - 95.7 fL 76.9 (L)   MCH 25.9 - 32.4 pg 24.1 (L)   MCHC 32.0 - 36.0 g/dL 04.5 (L)   RDW 40.9 - 81.1 % 15.8 (H)   MPV 6.8 - 10.7 fL 7.7   Platelet 150 - 450 10*9/L 120 (L)   Neutrophils % % 49.3   Lymphocytes % % 40.2   Monocytes % % 9.2   Eosinophils % % 0.5   Basophils % % 0.8   Absolute Neutrophils 1.8 - 7.8 10*9/L 2.8   Absolute Lymphocytes 1.1 - 3.6 10*9/L 2.3   Absolute Monocytes  0.3 - 0.8 10*9/L 0.5   Absolute Eosinophils 0.0 - 0.5 10*9/L 0.0   Absolute Basophils  0.0 - 0.1 10*9/L 0.0   Hypochromasia Not Present  Marked !   Sodium 135 - 145 mmol/L 146 (H)   Potassium 3.5 - 5.0 mmol/L 3.3 (L)   Chloride 98 - 107 mmol/L 107   CO2 21.0 - 32.0 mmol/L 30.2   Bun 8 - 20 mg/dL 36 (H)   Creatinine 9.14 - 1.30 mg/dL 7.82 (H)   BUN/Creatinine Ratio  23   eGFR CKD-EPI (2021) Male >=60 mL/min/1.48m2 43 (L)   Anion Gap 3 - 11 mmol/L 9   Glucose 70 - 179 mg/dL 956 (H)   Calcium 8.5 - 10.1 mg/dL 8.4 (L)   Albumin 3.5 - 5.0 g/dL 3.1 (L)   Total Protein 6.0 - 8.0 g/dL 5.6 (L)   Total Bilirubin 0.2 - 1.0 mg/dL 0.3   SGOT (AST) 15 - 40 U/L 16   ALT 12 - 78 U/L 18   Alkaline Phosphatase 46 - 116 U/L 119 (H)   Iron 50 - 150 ug/dL 21 (L)   TIBC 213 - 086 ug/dL 578   Iron Saturation (%) 20 - 55 % 8 (L)   Ferritin 10.5 - 307.3 ng/mL 29.0   (L): Data is abnormally low  (H): Data is abnormally high  !: Data is abnormal

## 2022-05-21 DIAGNOSIS — D509 Iron deficiency anemia, unspecified: Principal | ICD-10-CM

## 2022-05-26 NOTE — Unmapped (Signed)
[facility] Refill Coordination Note    Specialty Medication(s) to be Shipped:   Hematology/Oncology: Calquence 100mg     Other medication(s) to be shipped: No additional medications requested for fill at this time     Christian Abbott, DOB: 1935/04/21  Phone: [phone] (home)       All above HIPAA information was verified with patient's daughter     Was a Nurse, learning disability used for this call? No    Completed refill call assessment today to schedule patient's medication shipment from the [facility] [phone]).  All relevant notes have been reviewed.     Specialty medication(s) and dose(s) confirmed: Regimen is correct and unchanged.   Changes to medications: Christian Abbott reports no changes at this time.  Changes to insurance: No  New side effects reported not previously addressed with a pharmacist or physician: None reported  Questions for the pharmacist: No    Confirmed patient received a Conservation officer, historic buildings and a Surveyor, mining with first shipment. The patient will receive a drug information handout for each medication shipped and additional FDA Medication Guides as required.       DISEASE/MEDICATION-SPECIFIC INFORMATION        N/A    SPECIALTY MEDICATION ADHERENCE     Medication Adherence    Patient reported X missed doses in the last month: 0  Specialty Medication: CALQUENCE 100 mg tablet (acalabrutinib)  Patient is on additional specialty medications: No  Informant: patient                          Were doses missed due to medication being on hold? No    Calquence 100 mg: 12 days of medicine on hand        REFERRAL TO PHARMACIST     Referral to the pharmacist: Not needed      [facility]     Shipping address confirmed in Epic.     Delivery Scheduled: Yes, Expected medication delivery date: 06/04/22.     Medication will be delivered via Next Day Courier to the prescription address in Epic WAM.    Christian Abbott   [facility] Specialty Technician

## 2022-05-27 ENCOUNTER — Ambulatory Visit: Admit: 2022-05-27 | Discharge: 2022-05-28 | Payer: MEDICARE

## 2022-05-27 DIAGNOSIS — D509 Iron deficiency anemia, unspecified: Principal | ICD-10-CM

## 2022-05-27 DIAGNOSIS — D801 Nonfamilial hypogammaglobulinemia: Principal | ICD-10-CM

## 2022-05-27 DIAGNOSIS — C9110 Chronic lymphocytic leukemia of B-cell type not having achieved remission: Principal | ICD-10-CM

## 2022-05-27 MED ADMIN — acetaminophen (TYLENOL) tablet 650 mg: 650 mg | ORAL | @ 13:00:00 | Stop: 2022-05-27

## 2022-05-27 MED ADMIN — ferumoxytoL (FERAHEME) 510 mg in sodium chloride 0.9 % (NS) 100 mL IVPB-MBP: 510 mg | INTRAVENOUS | @ 13:00:00 | Stop: 2022-05-27

## 2022-05-27 MED ADMIN — diphenhydrAMINE (BENADRYL) capsule/tablet 25 mg: 25 mg | ORAL | @ 13:00:00 | Stop: 2022-05-27

## 2022-05-27 NOTE — Unmapped (Signed)
Patient here for feraheme. See MAR for premedications given. Infusion completed, tolerated well, VSS. Patient monitored for 30 minutes after infusion without sign of adverse effect/drug reaction. Patient provided blankets, drinks, and snacks as needed throughout infusion. Comfort continually assessed by staff. PIV flushed then removed per [place] protocol. Patient discharged in stable condition, ambulatory. Return to clinic information provided.  Patient and daughter have several questions today about GI workup and concern about falling iron levels. RN confirms that referral has been sent to [facility] and that hopefully he can get an appointment soon. Rn reassures that patient is at least getting iron in the interim which will ideally help. Patient and daughter verbalize understanding.

## 2022-05-27 NOTE — Unmapped (Signed)
Christian Killian, MD  Christian Fontana, LPN; Christian Dry, LPN  Caller: Unspecified (Today,  9:49 AM)  Add him into the clinic for next week.  He is on Plavix    Needs EGD at [facility] most likely need to get clearance on Plavix per cardiology  If he is not on a proton pump inhibitor initiate pantoprazole 40 mg p.o. twice daily AC for 90 days refill x1    Cjo

## 2022-05-27 NOTE — Unmapped (Signed)
To Lisa-

## 2022-05-27 NOTE — Unmapped (Signed)
Call placed to [facility] to expedite patient's referral for EGD. Spoke with Deana who said that she would put a note in to scheduler asking that they call patient and try to get him in within the next 2 weeks. She will also send note to the GI provider on call to see if patient needs to be seen or if they can simply schedule the procedure. CM provided her call back number.    Call placed to daughter Lamar Laundry to update her on referral; Sonya verbalized understanding. She said that she spoke with GI provider in [place] who recommends that EGD be performed in the hospital setting, whether out patient or in patient since GI provider does not think that patient can easily tolerated another EGD. Sonya asked that CM send contact # for [facility] to patient via MyChart, completed.    Telephone encounter routed to provider as FYI.

## 2022-05-27 NOTE — Unmapped (Signed)
MD line call:  Elease Hashimoto with [facility] calling regarding getting pt in for an EGD in next couple of weeks.  Referral in epic from Dr. Elyse Hsu.  Pt has previous GI provider in [place], but now he lives in [place].  He is getting an iron infusion today.  Last Hgb 9.4, Hematocrit 29.9. Ferritin 29, iron 21 and iron sat 8.  Patricia's call back is [phone].    To Dr. Emi Holes - on call provider

## 2022-05-28 NOTE — Unmapped (Signed)
Appt scheduled for 10/9.  Offered 2 dates next week, they requested current date.  Paperwork sent via MyChart with a map to location.

## 2022-06-01 MED ORDER — SUCRALFATE 100 MG/ML ORAL SUSPENSION
Freq: Two times a day (BID) | ORAL | 0 refills | 21 days | Status: CP
Start: 2022-06-01 — End: ?

## 2022-06-03 MED FILL — CALQUENCE (ACALABRUTINIB MALEATE) 100 MG TABLET: ORAL | 30 days supply | Qty: 60 | Fill #4

## 2022-06-06 DIAGNOSIS — D509 Iron deficiency anemia, unspecified: Principal | ICD-10-CM

## 2022-06-07 ENCOUNTER — Ambulatory Visit: Admit: 2022-06-07 | Discharge: 2022-06-07 | Payer: MEDICARE

## 2022-06-07 DIAGNOSIS — D801 Nonfamilial hypogammaglobulinemia: Principal | ICD-10-CM

## 2022-06-07 DIAGNOSIS — D509 Iron deficiency anemia, unspecified: Principal | ICD-10-CM

## 2022-06-07 DIAGNOSIS — C9110 Chronic lymphocytic leukemia of B-cell type not having achieved remission: Principal | ICD-10-CM

## 2022-06-07 MED ADMIN — diphenhydrAMINE (BENADRYL) capsule/tablet 25 mg: 25 mg | ORAL | @ 14:00:00 | Stop: 2022-06-07

## 2022-06-07 MED ADMIN — ferumoxytoL (FERAHEME) 510 mg in sodium chloride 0.9 % (NS) 100 mL IVPB-MBP: 510 mg | INTRAVENOUS | @ 15:00:00 | Stop: 2022-06-07

## 2022-06-07 MED ADMIN — acetaminophen (TYLENOL) tablet 650 mg: 650 mg | ORAL | @ 14:00:00 | Stop: 2022-06-07

## 2022-06-07 NOTE — Unmapped (Signed)
Feraheme completed, tolerated well, VSS.  Patient provided blankets, drinks, and snacks as needed throughout infusion.  Comfort continually assessed by staff.  PIV de-accessed per [place] protocol. Patient discharged in stable condition, ambulatory.  Return to clinic information provided.

## 2022-06-09 NOTE — Unmapped (Signed)
Charise Killian, MD  Grant Fontana, LPN; Samule Dry, LPN  Caller: Unspecified (1 week ago)  Does this patient need to be rescheduled.  I do not believe I have seen him as yet  Cjo

## 2022-06-09 NOTE — Unmapped (Signed)
Noted  

## 2022-06-09 NOTE — Unmapped (Signed)
Looks like the pt cancelled the 06/07/22 appt with you and scheduled a consult with Dr. Jefferey Pica on 06/14/22 at 10:30 in [facility].    To Dr. Emi Holes and Misty Stanley for fyi.

## 2022-06-14 ENCOUNTER — Ambulatory Visit: Admit: 2022-06-14 | Discharge: 2022-06-15 | Payer: MEDICARE

## 2022-06-14 DIAGNOSIS — K219 Gastro-esophageal reflux disease without esophagitis: Principal | ICD-10-CM

## 2022-06-14 DIAGNOSIS — I2510 Atherosclerotic heart disease of native coronary artery without angina pectoris: Principal | ICD-10-CM

## 2022-06-14 DIAGNOSIS — D696 Thrombocytopenia, unspecified: Principal | ICD-10-CM

## 2022-06-14 DIAGNOSIS — D508 Other iron deficiency anemias: Principal | ICD-10-CM

## 2022-06-14 NOTE — Unmapped (Signed)
We will do an EGD and colonoscopy for you pending clearance from your cardiologist  If these are negative we will consider doing a capsule enteroscopy  Continue sucralfate for acid reflux  Please call with any questions or concerns

## 2022-06-14 NOTE — Unmapped (Signed)
Shawn Trainer notified, pt informed that Shawn / [facility] will call to schedule procedures. Pt verbalized understanding.  Pt is diabetic, pt on Victoza . Pt needs cardiac clearance, pt is on plavix. Pt cardiologist is Dr. Celine Mans.

## 2022-06-14 NOTE — Unmapped (Signed)
Procedure: Colon/ EGD  Diagnosis: IDA  [facility] Provider: 1st available  PAT: yes  Diabetic: yes  Latex Allergy: no  Malignant Hypothermia: no  GLP-1 info needed: yes  Clearance: yes  pt is on plavix.Dr. Celine Mans

## 2022-06-14 NOTE — Unmapped (Signed)
06/14/2022    Demographics:  Christian Abbott is a 87 y.o. year old male    Referring physician:   Elyse Hsu, MD  [address]  [address]  [place],  [place] [address]          HPI / NOTE :     Today, I saw Christian Abbott for initial consultation at the request of Dr. Jomarie Longs regarding  IDA .  Outside records including available clinical notes, endoscopy reports, imaging results and pathology results were reviewed in detail as part of this initial consultation.     HPI:  Christian Abbott is 87 y.o. male who presents for iron deficiency anemia. Had EGD and colonoscopy in 2021 with ulcer/erosive gastropathy and started on PPI. Was found to have CLL and started on chemo and advised to stop PPI medication.  Denies any GI bleeding.  Denies any melena, hematochezia or hematemesis.  Is present today with his daughter aids in history.  She notes patient bruises very easily.  States her son noticed patient's abdomen and told him they had signs of internal bleeding.  Denies any unintentional weight loss.  Denies any nausea or vomiting.  Does take Plavix.  Denies any other complaints at this time      NSAID: Denies  Tobacco: Denies  Etoh: Denies  Family hx of GI malgnancy/disease: Denies  Weight loss: Denies    Review of Systems: positive as noted in HPI  Otherwise, the balance of 10 systems is negative.          GI HISTORY:     Endoscopy:      EGD colonoscopy 2021  -Colonoscopy with inflammatory polyp resected  -EGD with erosive gastropathy    Imaging:    No pertinent imaging on file    Prior GI Surgeries  Denies          Past Medical History:   Past medical history:   Past Medical History:   Diagnosis Date    Anemia     Asthma     CHF (congestive heart failure) (CMS-HCC)     Diabetes mellitus (CMS-HCC)     Hypertension      Past surgical history: No past surgical history on file.  Family history: History reviewed. No pertinent family history.  Social history:   Social History     Socioeconomic History    Marital status: Widowed     Spouse name: None    Number of children: None    Years of education: None    Highest education level: None   Tobacco Use    Smoking status: Never    Smokeless tobacco: Never   Substance and Sexual Activity    Alcohol use: No    Drug use: No             Allergies:   No Known Allergies          Medications:     Current Outpatient Medications   Medication Sig Dispense Refill    acalabrutinib (CALQUENCE) 100 mg tablet Take 1 tablet (100 mg total) by mouth Two (2) times a day. Swallow whole with water. Do not chew, crush, dissolve, or cut tablets. 60 tablet 5    ACCU-CHEK AVIVA PLUS TEST STRP Strp       ACCU-CHEK GUIDE ME GLUCOSE MTR Misc       ACCU-CHEK SOFTCLIX LANCETS lancets       albuterol 2.5 mg /3 mL (0.083 %) Nebu 3 mL, albuterol 5 mg/mL Nebu 0.5  mL Inhale 2.5 mg.      albuterol 2.5 mg/0.5 mL nebulizer solution Inhale 0.5 mL (2.5 mg total).      amLODIPine (NORVASC) 10 MG tablet Take 1 tablet (10 mg total) by mouth daily.      amLODIPine-atorvastatin (CADUET) 10-10 mg per tablet       atorvastatin (LIPITOR) 80 MG tablet Take 1 tablet (80 mg total) by mouth.      bumetanide (BUMEX) 1 MG tablet Take 1 tablet (1 mg total) by mouth daily.      chlorhexidine (PERIDEX) 0.12 % solution RINSE 15 ML S ONCE DAILY AFTER BREAKFAST FOLLOWING BRUSHING AND FLOSSING      cholecalciferol, vitamin D3, 5,000 unit Tab Take 1 tablet (125 mcg total) by mouth.      clopidogrel (PLAVIX) 75 mg tablet Take 1 tablet (75 mg total) by mouth.      cyanocobalamin, vitamin B-12, 1,000 mcg TbER Take 1 tablet by mouth.      dexAMETHasone (DECADRON) 6 MG tablet       doxazosin (CARDURA) 4 MG tablet Take 1 tablet (4 mg total) by mouth nightly.  1    famotidine (PEPCID) 40 MG tablet Take 1 tablet (40 mg total) by mouth.      ferrous sulfate 15 mg iron/1.5 mL Susp Take 1 tablet by mouth.      furosemide (LASIX) 20 MG tablet Take 2 tablets (40 mg total) by mouth.      glipiZIDE (GLUCOTROL XL) 2.5 MG 24 hr tablet Take 1 tablet (2.5 mg total) by mouth daily.      glipiZIDE (GLUCOTROL XL) 5 MG 24 hr tablet Take 1 tablet (5 mg total) by mouth daily.      glucosamine/chondr su A sod (OSTEO BI-FLEX ORAL) Take by mouth.      hydrALAZINE (APRESOLINE) 50 MG tablet Take 1 tablet (50 mg total) by mouth Three (3) times a day.      ipratropium-albuterol (DUO-NEB) 0.5-2.5 mg/3 mL nebulizer GIVE 3 ML TWICE DAILY INHALATION 90 DAYS  3    JARDIANCE 25 mg tablet Take 1 tablet (25 mg total) by mouth daily. Pt reports he is now taking 10mg       JENTADUETO 2.5-500 mg Tab       liraglutide (VICTOZA 2-PAK) 0.6 mg/0.1 mL (18 mg/3 mL) injection       LIRAGLUTIDE SUBQ Inject 1.8 mg under the skin.      losartan (COZAAR) 50 MG tablet Take 1 tablet (50 mg total) by mouth daily.  0    magnesium oxide (MAG-OX) 400 mg (241.3 mg elemental magnesium) tablet Take 1 tablet by mouth daily.      metoprolol succinate (TOPROL-XL) 50 MG 24 hr tablet Take 1 tablet (50 mg total) by mouth. Pt reports he is taking 25mg  daily      mometasone-formoterol 200-5 mcg/actuation HFAA Inhale 2 puffs two (2) times a day.      spironolactone (ALDACTONE) 25 MG tablet Take 1 tablet (25 mg total) by mouth every other day.      sucralfate (CARAFATE) 100 mg/mL suspension TAKE 10 ML (1 G TOTAL) BY MOUTH TWO (2) TIMES A DAY. 420 mL 0    TRUE METRIX LEVEL 2 Soln       VICTOZA 3-PAK 0.6 mg/0.1 mL (18 mg/3 mL) injection 0.3 mL (1.8 mg total).  2    vit C-bioflav-hesp-rutin-hb196 (BIOFLEX) 500-50-25-40 mg Tab Take by mouth.      zafirlukast (ACCOLATE) 20 MG tablet Take 1 tablet (20  mg total) by mouth.      acalabrutinib (CALQUENCE) 100 mg capsule Take 1 capsule (100 mg total) by mouth Two (2) times a day. Do not break, open, or chew capsules. (Patient not taking: Reported on 06/14/2022) 60 capsule 5     No current facility-administered medications for this visit.             Physical Exam:   BP 145/64  - Pulse 78  - Temp 36.8 ??C (98.2 ??F)  - Resp 16  - Ht 165.1 cm (5' 5)  - Wt 67 kg (147 lb 12.8 oz)  - BMI 24.60 kg/m??     General: Awake,  in no acute distress.   HEENT: Normocephalic, atraumatic.    Thyroid: no thyromegaly or thyroid nodules palpated.  CV: Heart with regular rate.   Lungs: reg respirations, normal breathing pattern.  Abd: ND.  Ext: Warm   Neuro: No gross focal deficits.   Skin: Of normal temperature and texture for the patient's age.            Labs, Data & Indices:     Lab Review:   Lab Results   Component Value Date    WBC 5.7 05/20/2022    WBC 14.0 (H) 09/30/2014    RBC 3.90 (L) 05/20/2022    RBC 4.31 (L) 09/30/2014    HGB 9.4 (L) 05/20/2022    HGB 12.6 (L) 09/30/2014     Lab Results   Component Value Date    AST 16 05/20/2022    AST 22 09/30/2014    ALT 18 05/20/2022    ALT 32 09/30/2014    BUN 36 (H) 05/20/2022    BUN 14.0 09/30/2014    Creatinine 1.57 (H) 05/20/2022    Creatinine 1.10 09/30/2014    CO2 30.2 05/20/2022    CO2 31.6 09/30/2014    Albumin 3.1 (L) 05/20/2022    Albumin 3.6 09/30/2014    Calcium 8.4 (L) 05/20/2022    Calcium 8.7 09/30/2014     No results found for: TSH     ............................................................................................................................................Marland Kitchen          Assessment & Recommendations:     Stalin Vanname is a 87 y.o. male who presents for iron deficiency anemia, CLL, coronary disease on Plavix, thrombocytopenia, GERD  -Patient with history of iron deficiency anemia  -Previous EGD and colonoscopy with erosive gastropathy but otherwise negative  -Does take Plavix and history of chronic thrombocytopenia  -No evidence of active GI bleeding  -We will plan to repeat EGD and colonoscopy at [place] to ensure there are no GI pathologies contributing patient's anemia  -If negative will plan for capsule enteroscopy  -Okay to continue sucralfate for reflux at this time  -If needed will plan to resume PPI versus H2 blocker and adjust as needed based on chemo schedule  -Further recommendations based on endoscopic evaluation  -Follow-up as needed    PLAN:  Patient Instructions   We will do an EGD and colonoscopy for you pending clearance from your cardiologist  If these are negative we will consider doing a capsule enteroscopy  Continue sucralfate for acid reflux  Please call with any questions or concerns    TOTAL VISIT TIME:  I personally spent 80 minutes face-to-face and non-face-to-face in the care of this patient, which includes all pre, intra, and post visit time on the date of service.    ________________________________  Catalina Pizza, MD  [facility]

## 2022-06-21 NOTE — Unmapped (Signed)
Left a msg to schedule

## 2022-06-25 DIAGNOSIS — C9110 Chronic lymphocytic leukemia of B-cell type not having achieved remission: Principal | ICD-10-CM

## 2022-06-25 NOTE — Unmapped (Signed)
[facility] Refill Coordination Note    Specialty Medication(s) to be Shipped:   Hematology/Oncology: Calquence 100mg     Other medication(s) to be shipped: No additional medications requested for fill at this time     Christian Abbott, DOB: 05-22-1935  Phone: [phone] (home)       All above HIPAA information was verified with patient's daughter     Was a Nurse, learning disability used for this call? No    Completed refill call assessment today to schedule patient's medication shipment from the [facility] [phone]).  All relevant notes have been reviewed.     Specialty medication(s) and dose(s) confirmed: Regimen is correct and unchanged.   Changes to medications: Christian Abbott reports no changes at this time.  Changes to insurance: No  New side effects reported not previously addressed with a pharmacist or physician: None reported  Questions for the pharmacist: No    Confirmed patient received a Conservation officer, historic buildings and a Surveyor, mining with first shipment. The patient will receive a drug information handout for each medication shipped and additional FDA Medication Guides as required.       DISEASE/MEDICATION-SPECIFIC INFORMATION        N/A    SPECIALTY MEDICATION ADHERENCE     Medication Adherence    Patient reported X missed doses in the last month: 0  Specialty Medication: CALQUENCE 100 mg tablet (acalabrutinib)  Patient is on additional specialty medications: No  Informant: child/children                          Were doses missed due to medication being on hold? No    Calquence 100 mg: 12 days of medicine on hand       REFERRAL TO PHARMACIST     Referral to the pharmacist: Not needed      [facility]     Shipping address confirmed in Epic.     Delivery Scheduled: Yes, Expected medication delivery date: 07/02/22.     Medication will be delivered via Next Day Courier to the prescription address in Epic WAM.    Christian Abbott   [facility] Specialty Technician

## 2022-06-28 ENCOUNTER — Ambulatory Visit: Admit: 2022-06-28 | Discharge: 2022-06-28 | Payer: MEDICARE

## 2022-06-28 DIAGNOSIS — D801 Nonfamilial hypogammaglobulinemia: Principal | ICD-10-CM

## 2022-06-28 DIAGNOSIS — C9110 Chronic lymphocytic leukemia of B-cell type not having achieved remission: Principal | ICD-10-CM

## 2022-06-28 DIAGNOSIS — D509 Iron deficiency anemia, unspecified: Principal | ICD-10-CM

## 2022-06-28 LAB — CBC W/ AUTO DIFF
BASOPHILS ABSOLUTE COUNT: 0.1 10*9/L (ref 0.0–0.1)
BASOPHILS RELATIVE PERCENT: 0.9 %
EOSINOPHILS ABSOLUTE COUNT: 0.1 10*9/L (ref 0.0–0.5)
EOSINOPHILS RELATIVE PERCENT: 1.4 %
HEMATOCRIT: 33.1 % — ABNORMAL LOW (ref 39.0–48.0)
HEMOGLOBIN: 10.7 g/dL — ABNORMAL LOW (ref 12.9–16.5)
LYMPHOCYTES ABSOLUTE COUNT: 2.7 10*9/L (ref 1.1–3.6)
LYMPHOCYTES RELATIVE PERCENT: 44.8 %
MEAN CORPUSCULAR HEMOGLOBIN CONC: 32.2 g/dL (ref 32.0–36.0)
MEAN CORPUSCULAR HEMOGLOBIN: 25.9 pg (ref 25.9–32.4)
MEAN CORPUSCULAR VOLUME: 80.3 fL (ref 77.6–95.7)
MEAN PLATELET VOLUME: 8.2 fL (ref 6.8–10.7)
MONOCYTES ABSOLUTE COUNT: 0.5 10*9/L (ref 0.3–0.8)
MONOCYTES RELATIVE PERCENT: 7.9 %
NEUTROPHILS ABSOLUTE COUNT: 2.8 10*9/L (ref 1.8–7.8)
NEUTROPHILS RELATIVE PERCENT: 45 %
PLATELET COUNT: 96 10*9/L — ABNORMAL LOW (ref 150–450)
RED BLOOD CELL COUNT: 4.12 10*12/L — ABNORMAL LOW (ref 4.26–5.60)
RED CELL DISTRIBUTION WIDTH: 20.7 % — ABNORMAL HIGH (ref 12.2–15.2)
WBC ADJUSTED: 6.1 10*9/L (ref 3.6–11.2)

## 2022-06-28 LAB — COMPREHENSIVE METABOLIC PANEL
ALBUMIN: 3.2 g/dL — ABNORMAL LOW (ref 3.5–5.0)
ALKALINE PHOSPHATASE: 138 U/L — ABNORMAL HIGH (ref 46–116)
ALT (SGPT): 23 U/L (ref 12–78)
ANION GAP: 11 mmol/L (ref 3–11)
AST (SGOT): 16 U/L (ref 15–40)
BILIRUBIN TOTAL: 0.4 mg/dL (ref 0.2–1.0)
BLOOD UREA NITROGEN: 41 mg/dL — ABNORMAL HIGH (ref 8–20)
BUN / CREAT RATIO: 24
CALCIUM: 8.8 mg/dL (ref 8.5–10.1)
CHLORIDE: 104 mmol/L (ref 98–107)
CO2: 27 mmol/L (ref 21.0–32.0)
CREATININE: 1.68 mg/dL — ABNORMAL HIGH (ref 0.80–1.30)
EGFR CKD-EPI (2021) MALE: 40 mL/min/{1.73_m2} — ABNORMAL LOW (ref >=60)
GLUCOSE RANDOM: 212 mg/dL — ABNORMAL HIGH (ref 70–179)
POTASSIUM: 2.9 mmol/L — ABNORMAL LOW (ref 3.5–5.0)
PROTEIN TOTAL: 5.7 g/dL — ABNORMAL LOW (ref 6.0–8.0)
SODIUM: 142 mmol/L (ref 135–145)

## 2022-06-28 LAB — IRON & TIBC
IRON SATURATION: 16 % — ABNORMAL LOW (ref 20–55)
IRON: 38 ug/dL — ABNORMAL LOW (ref 50–150)
TOTAL IRON BINDING CAPACITY: 233 ug/dL — ABNORMAL LOW (ref 250–450)

## 2022-06-28 LAB — IGG: GAMMAGLOBULIN; IGG: 240 mg/dL — ABNORMAL LOW (ref 646–2013)

## 2022-06-28 LAB — FERRITIN: FERRITIN: 226 ng/mL (ref 10.5–307.3)

## 2022-06-28 MED ADMIN — potassium chloride 20 mEq in 100 mL IVPB Premix: 20 meq | INTRAVENOUS | @ 16:00:00 | Stop: 2022-06-28

## 2022-06-28 MED ADMIN — immun glob G(IgG)-pro-IgA 0-50 (PRIVIGEN) 10 % intravenous solution 5 g: 5 g | INTRAVENOUS | @ 15:00:00 | Stop: 2022-06-28

## 2022-06-28 MED ADMIN — diphenhydrAMINE (BENADRYL) capsule/tablet 25 mg: 25 mg | ORAL | @ 14:00:00 | Stop: 2022-06-28

## 2022-06-28 MED ADMIN — acetaminophen (TYLENOL) tablet 650 mg: 650 mg | ORAL | @ 14:00:00 | Stop: 2022-06-28

## 2022-06-28 MED ADMIN — potassium chloride ER tablet 20 mEq: 20 meq | ORAL | @ 14:00:00 | Stop: 2022-06-28

## 2022-06-28 MED ADMIN — sodium chloride (NS) 0.9 % infusion: 20 mL/h | INTRAVENOUS | @ 14:00:00 | Stop: 2022-06-28

## 2022-06-28 MED ADMIN — potassium chloride 20 mEq in 100 mL IVPB Premix: 20 meq | INTRAVENOUS | @ 18:00:00 | Stop: 2022-06-28

## 2022-06-28 MED ADMIN — immun glob G(IgG)-pro-IgA 0-50 (PRIVIGEN) 10 % intravenous solution 20 g: 20 g | INTRAVENOUS | @ 15:00:00 | Stop: 2022-06-28

## 2022-06-28 NOTE — Unmapped (Incomplete)
HEMATOLOGY ONCOLOGY CLINIC:  INTERVAL FOLLOW UP  NOTE    PCP:  Arturo Morton, MD    DATE OF ENCOUNTER:  06/28/2022    INTERVAL HISTORY:  Christian Abbott is an 87 y.o. male who comes today for continued follow-up of chronic lymphocytic leukemia.     Given his advanced disease he was started on acalbrutinib 12/19/20. He is accompanied  by daughter.     Scheduled for IVIG today which is being given every 3 months.     Patient was recently noted to have a worsening iron deficiency anemia and received 2 doses of feraheme 9/28-10/9/23    Met with GI and planned for EGD soon, pending cardiology clearance.     Reports chronic fatigue. No night sweats reports feeling better since receiving IV iron.         ONCOLOGY HISTORY  Oncology History Overview Note   87 y/o male with long-standing history of chronic lymphocytic leukemia initially diagnosed in 1998  Status post Rituxan based therapy for 8 weeks during that time  History of anemia treated with Procrit off and on  History of hypertension diabetes asthma hypercholesterolemia.  History of hospitalization in January 2014 for bilateral pneumonia [facility] for 3 weeks.     CLL (chronic lymphocytic leukemia) (CMS-HCC)   07/18/1997 Initial Diagnosis    CLL (chronic lymphocytic leukemia)           PAST MEDICAL HISTORY:     Patient Active Problem List   Diagnosis    CLL (chronic lymphocytic leukemia) (CMS-HCC)    Iron deficiency anemia    Hypogammaglobulinemia (CMS-HCC)    Anemia    Need for prophylactic immunotherapy      has no past surgical history on file.    ALLERGIES:  has No Known Allergies.      MEDICATIONS: has a current medication list which includes the following prescription(s): acalabrutinib, accu-chek aviva plus test strp, accu-chek guide me glucose mtr, accu-chek softclix lancets, albuterol 2.5 mg /3 mL (0.083 %) Nebu 3 mL, albuterol 5 mg/mL Nebu 0.5 mL, albuterol, amlodipine, amlodipine-atorvastatin, atorvastatin, bumetanide, chlorhexidine, cholecalciferol (vitamin d3-125 mcg (5,000 unit)), clopidogrel, cyanocobalamin (vitamin b-12), dexamethasone, doxazosin, famotidine, ferrous sulfate, furosemide, glipizide, glipizide, glucosamine/chondr su a sod, hydralazine, ipratropium-albuterol, jardiance, jentadueto, victoza 2-pak, liraglutide, losartan, magnesium oxide, metoprolol succinate, mometasone-formoterol, spironolactone, sucralfate, true metrix level 2, victoza 3-pak, bioflex, zafirlukast, and acalabrutinib, and the following Facility-Administered Medications: immun glob g(igg)-pro-iga 0-50 **FOLLOWED BY** immun glob g(igg)-pro-iga 0-50.    FAMILY HISTORY / SOCIAL HISTORY:  Reviewed and updated as appropriate.    REVIEW OF SYSTEMS:  As per interval history.  All other systems reviewed and negative.    PHYSICAL EXAM    Vitals: BP 157/69  - Pulse 72  - Temp 36.4 ??C (97.6 ??F) (Temporal)  - Resp 16  - Ht 165.1 cm (5' 5)  - Wt 66.6 kg (146 lb 12.8 oz)  - SpO2 98%  - BMI 24.43 kg/m??   General:  Pleasant, no acute distress.    Pain:  Pain Evaluation:                                   Pain Score (0 - 1):                                Pain Location:  Eyes:  Appears normal.   HENT:  Atraumatic.   Neck:  thyroid midline.  Lymphatics: Bilateral small cervical lymph nodes.  No supraclavicular axillary or inguinal lymphadenopathy   Cardiovascular:   RRR, trace edema b/l ankles.   Respiratory:  Unlabored.  Good air entry b/l. No wheezing.  CTABL   Gastrointestinal: Does not appear distended.  Nontender. No palpable HSM.   Skin:  No rashes or subcutaneous nodules.    Musculoskeletal:  Gait is steady     Psychiatric:  Affect appropriate. Judgment and insight nl.  Neurologic:  Alert and oriented x3. Grossly non-focal.              RECENT LABS/PATHOLOGY:  Results for orders placed or performed in visit on 06/28/22   CBC w/ Differential   Result Value Ref Range    WBC 6.1 3.6 - 11.2 10*9/L    RBC 4.12 (L) 4.26 - 5.60 10*12/L    HGB 10.7 (L) 12.9 - 16.5 g/dL    HCT 16.1 (L) 09.6 - 48.0 %    MCV 80.3 77.6 - 95.7 fL    MCH 25.9 25.9 - 32.4 pg    MCHC 32.2 32.0 - 36.0 g/dL    RDW 04.5 (H) 40.9 - 15.2 %    MPV 8.2 6.8 - 10.7 fL    Platelet 96 (L) 150 - 450 10*9/L    Neutrophils % 45.0 %    Lymphocytes % 44.8 %    Monocytes % 7.9 %    Eosinophils % 1.4 %    Basophils % 0.9 %    Absolute Neutrophils 2.8 1.8 - 7.8 10*9/L    Absolute Lymphocytes 2.7 1.1 - 3.6 10*9/L    Absolute Monocytes 0.5 0.3 - 0.8 10*9/L    Absolute Eosinophils 0.1 0.0 - 0.5 10*9/L    Absolute Basophils 0.1 0.0 - 0.1 10*9/L    Anisocytosis Marked (A) Not Present     RADIOLOGY      08/13/21 CT chest   IMPRESSION:  1. Overall improved aeration in the bilateral lungs, suggesting improved infectious or inflammatory pneumonitis since prior examination. Persistent linear and subpleural reticular opacities bilaterally, suggestive of residual disease.  2. Scattered micronodules bilaterally, unchanged since recent prior examinations. No new, or enlarging nodules are noted. Recommend follow-up examination in 6-12 months based on Fleischner Society criteria.  3. Mediastinal and hilar lymph nodes appear relatively unchanged to slightly improved in the interval. No new or enlarging adenopathy.    ASSESSMENT and PLAN:    # Chronic lymphocytic leukemia  --Rai stage IV CLL  --CLL FISH panel shows 13 q. deletion 21.5%, IGHV mutated -good prognostic features  --11/05/2020: TP53 mutation negative.  NOTCH1 negative.  SF3B1 positive.  SF3B1 positivity indicates poor prognosis.  -- no evidence of hemolysis.   --Indication for treatment:  advanced stage and history of thrombocytopenia  -- He has been treated with Rituxan many years ago by Dr Thedore Mins, with good response in the 1990s. Treatment indication at that time may have been frequent infections.  -- started acabrutinib 12/19/20  -- Reviewed cbc CMP.  ALC remains normal. Hb improved to 10.7. plt down to 96.     PLAN:  -- continue Acalbrutinib 100 mg BID.   -- proceed with IVIG         # Patchy lung opacities  -- most recent CT chest from 05/16/2021 mixed response with regardin the GGOs.  He continues to have no infectious symptoms.  Patient is we would expect after  his COVID diagnosis.  He followed up with Dr. Virgia Land recently.  Findings on his CT chest are felt to represent volume overload as noted by a recent elevation in proBNP on 06/02/2021.  No pulmonary intervention was recommended by Dr. Virgia Land at this time.  --follow up CT chest 08/13/21 showed overall improved aeration. Small indeterminate micronodules at [place] and per radiology require 6-12 month follow up .   -- We will continue to monitor cliically and repeat CT chest in  In 07/2022  - ordered  -- he will continue following with Dr Ace Gins.     #Iron deficiency anemia  -- He was noted to have iron deficiency in 2014 and has since received IV iron in the past and has been on oral iron 1 pill/day.   -- iron deficiency anemia/ACD in 07/2019 with hb down to 8.7 and ferritin 78, iron sat 12%  -- now s/p 1020 mg ferheme 07/2019 and early 10/2018 with hb improvement to 10.9 today.   --EGD and colonoscopy with Dr. Loreta Ave 01/2020 revealed bleeding gastric ulcer, smaller antral ulcer with old blood. .  Currently being managed with sucralfate.  -- In June 2023 hemoglobin was down to 8.3.  He was given additional IV iron with Feraheme 510 mg x 2 doses  -- seen by Gi and planned for EGD , pending cardiology clearance  -- s/p feraheme 510 mg x 2 doses 9/28-10/9        # Hypogammaglobulinemia   --History level continues to be low however he has not had any more infections since we started him on IVIG. Before we started him on IVIG he has had multiple hospitalizations for pneumonias.  --Therefore we will continue IVIG for now every 3 months as he is tolerated this well.  --  next IVIG schedule for  today.          # Health maintenance:  --  Evusheld  #1 12/04/20   -- bivalent booster  06/01/21  -- influenza vaccine 06/01/21  --evusheld  #2 07/01/21          # diastolic CHF with volume overload  -- followed closely by Cardiology.recently switched to bumex.   -- diuresis has bee associated with some degree of renal dysfunction in the last 5-6 months  -- he will continue following closely with Cardiology    # CKD Stage IV  -- Following closely with [facility].  -- Cr stable      DISPO:  RTC in  2 weeks for labs  RTC in 4 weeks for MD visit   .     I have independently reviewed notes from prior visits, results from labs and imaging.  The current plan of care requires intensive monitoring for toxicity due to use of antineoplastic treatments.  Medical decision making was of high complexity due to to ongoing risk of morbidity/mortality from antineoplastic treatment.      Elyse Hsu, MD  [facility].  -- Cr stable      DISPO:  RTC in  6 weeks for labs and MD vist along with IVIG  .     I have independently reviewed notes from prior visits, results from labs and imaging.  The current plan of care requires intensive monitoring for toxicity due to use of antineoplastic treatments.  Medical decision making was of high complexity due to to ongoing risk of morbidity/mortality from antineoplastic treatment.      Elyse Hsu, MD  [facility]

## 2022-06-28 NOTE — Unmapped (Signed)
Pt here for IVIG. Potassium 2.9. Per daughter, pt not currently on potassium supplementation. Dr. Jomarie Longs informed and order received for 20 meq PO potassium and 40 meq IV potassium. Daughter and pt informed and in agreement with plan. Patient's daughter stated she will call patient's cardiologist who manages potassium supplementation to inform.     Infusions completed, tolerated well, VSS.  Patient provided blankets, drinks, and snacks as needed throughout infusion.  Comfort continually assessed by staff.  PIV de-accessed per [place] protocol. Patient discharged in stable condition, ambulatory with cane.  Return to clinic information provided.

## 2022-06-29 NOTE — Unmapped (Signed)
My Chart message sent

## 2022-06-30 DIAGNOSIS — D801 Nonfamilial hypogammaglobulinemia: Principal | ICD-10-CM

## 2022-07-01 MED ORDER — SODIUM,POTASSIUM,MAG SULFATES 17.5 GRAM-3.13 GRAM-1.6 GRAM ORAL SOLN
0 refills | 0 days | Status: CP
Start: 2022-07-01 — End: ?

## 2022-07-01 MED FILL — CALQUENCE (ACALABRUTINIB MALEATE) 100 MG TABLET: ORAL | 30 days supply | Qty: 60 | Fill #5

## 2022-07-01 NOTE — Unmapped (Signed)
Pt agrees to the following appointments. Please send packet via [facility], Send in Rx if needed, and obtain any clearances if required.    CASE: [id number]   PROCEDURE: EGD/COLON  PT SCHEDULED:  12/12 @ 0830 at [facility]  PRETESTING: yes 12/5 @ 1430

## 2022-07-01 NOTE — Unmapped (Signed)
Received call from patient's daughter, Lissa Hoard, who wanted to know why patient was referred for a CT chest since that did not come up during patient's appt with provider on 06/28/22. CM explained that per provider's note the CT chest is for ongoing monitoring of patient's small indeterminate micronodules that showed on CT in 07/2021. Sonia expressed understanding and appreciation for the explanation. She reported that patient is scheduled for his EGD/Colonoscopy on 08/10/22.

## 2022-07-01 NOTE — Unmapped (Signed)
Prep and appt information sent to pt via mychart, suprep escribed to pt pharmacy on file. Cardiology clearance sent to Dr. Everardo All.

## 2022-07-02 DIAGNOSIS — C9110 Chronic lymphocytic leukemia of B-cell type not having achieved remission: Principal | ICD-10-CM

## 2022-07-02 MED ORDER — SUCRALFATE 100 MG/ML ORAL SUSPENSION
0 refills | 0 days | Status: CP
Start: 2022-07-02 — End: ?

## 2022-07-02 MED ORDER — ACALABRUTINIB MALEATE 100 MG TABLET
ORAL_TABLET | Freq: Two times a day (BID) | ORAL | 5 refills | 30 days | Status: CP
Start: 2022-07-02 — End: ?
  Filled 2022-08-05: qty 60, 30d supply, fill #0

## 2022-07-06 NOTE — Unmapped (Signed)
Pt informed via mychart that he was cleared and to hold his plavix 5 days prior to his procedure date.

## 2022-07-08 ENCOUNTER — Ambulatory Visit: Admit: 2022-07-08 | Discharge: 2022-07-09 | Payer: MEDICARE

## 2022-07-08 DIAGNOSIS — C9110 Chronic lymphocytic leukemia of B-cell type not having achieved remission: Principal | ICD-10-CM

## 2022-07-08 LAB — CBC W/ AUTO DIFF
BASOPHILS ABSOLUTE COUNT: 0 10*9/L (ref 0.0–0.1)
BASOPHILS RELATIVE PERCENT: 0.8 %
EOSINOPHILS ABSOLUTE COUNT: 0 10*9/L (ref 0.0–0.5)
EOSINOPHILS RELATIVE PERCENT: 0.7 %
HEMATOCRIT: 33.1 % — ABNORMAL LOW (ref 39.0–48.0)
HEMOGLOBIN: 10.5 g/dL — ABNORMAL LOW (ref 12.9–16.5)
LYMPHOCYTES ABSOLUTE COUNT: 2.5 10*9/L (ref 1.1–3.6)
LYMPHOCYTES RELATIVE PERCENT: 45.8 %
MEAN CORPUSCULAR HEMOGLOBIN CONC: 31.7 g/dL — ABNORMAL LOW (ref 32.0–36.0)
MEAN CORPUSCULAR HEMOGLOBIN: 25.5 pg — ABNORMAL LOW (ref 25.9–32.4)
MEAN CORPUSCULAR VOLUME: 80.5 fL (ref 77.6–95.7)
MEAN PLATELET VOLUME: 8 fL (ref 6.8–10.7)
MONOCYTES ABSOLUTE COUNT: 0.5 10*9/L (ref 0.3–0.8)
MONOCYTES RELATIVE PERCENT: 9.4 %
NEUTROPHILS ABSOLUTE COUNT: 2.4 10*9/L (ref 1.8–7.8)
NEUTROPHILS RELATIVE PERCENT: 43.3 %
PLATELET COUNT: 77 10*9/L — ABNORMAL LOW (ref 150–450)
RED BLOOD CELL COUNT: 4.11 10*12/L — ABNORMAL LOW (ref 4.26–5.60)
RED CELL DISTRIBUTION WIDTH: 20.6 % — ABNORMAL HIGH (ref 12.2–15.2)
WBC ADJUSTED: 5.5 10*9/L (ref 3.6–11.2)

## 2022-07-08 LAB — COMPREHENSIVE METABOLIC PANEL
ALBUMIN: 3.1 g/dL — ABNORMAL LOW (ref 3.5–5.0)
ALKALINE PHOSPHATASE: 138 U/L — ABNORMAL HIGH (ref 46–116)
ALT (SGPT): 20 U/L (ref 12–78)
ANION GAP: 8 mmol/L (ref 3–11)
AST (SGOT): 17 U/L (ref 15–40)
BILIRUBIN TOTAL: 0.3 mg/dL (ref 0.2–1.0)
BLOOD UREA NITROGEN: 45 mg/dL — ABNORMAL HIGH (ref 8–20)
BUN / CREAT RATIO: 26
CALCIUM: 9 mg/dL (ref 8.5–10.1)
CHLORIDE: 102 mmol/L (ref 98–107)
CO2: 29.2 mmol/L (ref 21.0–32.0)
CREATININE: 1.71 mg/dL — ABNORMAL HIGH (ref 0.80–1.30)
EGFR CKD-EPI (2021) MALE: 39 mL/min/{1.73_m2} — ABNORMAL LOW (ref >=60)
GLUCOSE RANDOM: 275 mg/dL — ABNORMAL HIGH (ref 70–179)
POTASSIUM: 4 mmol/L (ref 3.5–5.0)
PROTEIN TOTAL: 6 g/dL (ref 6.0–8.0)
SODIUM: 139 mmol/L (ref 135–145)

## 2022-07-08 LAB — FERRITIN: FERRITIN: 156 ng/mL (ref 10.5–307.3)

## 2022-07-08 LAB — IRON & TIBC
IRON SATURATION: 15 % — ABNORMAL LOW (ref 20–55)
IRON: 35 ug/dL — ABNORMAL LOW (ref 50–150)
TOTAL IRON BINDING CAPACITY: 230 ug/dL — ABNORMAL LOW (ref 250–450)

## 2022-07-08 LAB — IGG: GAMMAGLOBULIN; IGG: 552 mg/dL — ABNORMAL LOW (ref 646–2013)

## 2022-07-08 NOTE — Unmapped (Signed)
Printed results of today's labs (with the exception of IgG which does not result same-day) per patient's request and left at front desk for patient to pick up later today.

## 2022-07-09 NOTE — Unmapped (Signed)
Received VM from patient's daughter, Lissa Hoard, who said that based on lab results on 07/08/22 patient's numbers are getting worse, particularly his platelets, and they would like to know what provider plans to do. Patient is scheduled for colonoscopy in early December, but in the meantime they want to know what is being done.    Routed encounter to provider.

## 2022-07-12 NOTE — Unmapped (Signed)
Received a follow up call from patient's daughter, Lissa Hoard, who expressed concerns about patient's lab work on 11/9//23 and wanted to discuss with provider. CM explained that original message was sent to provider who is just coming back from being out of the clinic last week as he was working in patient. Provider will advise once he has been able to review labs. OK to message patient via MyChart.    Encounter routed to provider.

## 2022-07-13 DIAGNOSIS — C9110 Chronic lymphocytic leukemia of B-cell type not having achieved remission: Principal | ICD-10-CM

## 2022-07-13 NOTE — Unmapped (Signed)
MyChart message sent to patient.    In Basket message sent to scheduling staff with request to call patient to schedule lab appt for 11/21.    CBC with diff order entered for review and cosign.

## 2022-07-21 ENCOUNTER — Ambulatory Visit: Admit: 2022-07-21 | Discharge: 2022-07-22 | Payer: MEDICARE

## 2022-07-21 LAB — CBC W/ AUTO DIFF
BASOPHILS ABSOLUTE COUNT: 0 10*9/L (ref 0.0–0.1)
BASOPHILS RELATIVE PERCENT: 0.8 %
EOSINOPHILS ABSOLUTE COUNT: 0 10*9/L (ref 0.0–0.5)
EOSINOPHILS RELATIVE PERCENT: 0.7 %
HEMATOCRIT: 32.1 % — ABNORMAL LOW (ref 39.0–48.0)
HEMOGLOBIN: 9.9 g/dL — ABNORMAL LOW (ref 12.9–16.5)
LYMPHOCYTES ABSOLUTE COUNT: 2.3 10*9/L (ref 1.1–3.6)
LYMPHOCYTES RELATIVE PERCENT: 42.5 %
MEAN CORPUSCULAR HEMOGLOBIN CONC: 30.8 g/dL — ABNORMAL LOW (ref 32.0–36.0)
MEAN CORPUSCULAR HEMOGLOBIN: 25.3 pg — ABNORMAL LOW (ref 25.9–32.4)
MEAN CORPUSCULAR VOLUME: 82 fL (ref 77.6–95.7)
MEAN PLATELET VOLUME: 8.6 fL (ref 6.8–10.7)
MONOCYTES ABSOLUTE COUNT: 0.4 10*9/L (ref 0.3–0.8)
MONOCYTES RELATIVE PERCENT: 7.9 %
NEUTROPHILS ABSOLUTE COUNT: 2.6 10*9/L (ref 1.8–7.8)
NEUTROPHILS RELATIVE PERCENT: 48.1 %
PLATELET COUNT: 90 10*9/L — ABNORMAL LOW (ref 150–450)
RED BLOOD CELL COUNT: 3.91 10*12/L — ABNORMAL LOW (ref 4.26–5.60)
RED CELL DISTRIBUTION WIDTH: 19.7 % — ABNORMAL HIGH (ref 12.2–15.2)
WBC ADJUSTED: 5.5 10*9/L (ref 3.6–11.2)

## 2022-07-21 NOTE — Unmapped (Signed)
Called and spoke with Lissa Hoard, patient's daughter and let her know that provider reviewed CBC; platelets are better. Provider thinks changes in levels is a medication side effect. Provider recommends that patient continue same dose and reassess in 2 weeks when he has follow up appt with provider. Daughter verbalized understanding and appreciation for call.

## 2022-07-21 NOTE — Unmapped (Signed)
[facility] Clinical Assessment & Refill Coordination Note    Christian Abbott, DOB: 26-Mar-1935  Phone: [phone] (home)     All above HIPAA information was verified with patient's family member, daughter.     Was a Nurse, learning disability used for this call? No    Specialty Medication(s):   Hematology/Oncology: Calquence     Current Outpatient Medications   Medication Sig Dispense Refill   ??? acalabrutinib (CALQUENCE) 100 mg tablet Take 1 tablet (100 mg total) by mouth Two (2) times a day. Swallow whole with water. Do not chew, crush, dissolve, or cut tablets. 60 tablet 5   ??? ACCU-CHEK AVIVA PLUS TEST STRP Strp      ??? ACCU-CHEK GUIDE ME GLUCOSE MTR Misc      ??? ACCU-CHEK SOFTCLIX LANCETS lancets      ??? albuterol 2.5 mg /3 mL (0.083 %) Nebu 3 mL, albuterol 5 mg/mL Nebu 0.5 mL Inhale 2.5 mg.     ??? albuterol 2.5 mg/0.5 mL nebulizer solution Inhale 0.5 mL (2.5 mg total).     ??? amLODIPine (NORVASC) 10 MG tablet Take 1 tablet (10 mg total) by mouth daily.     ??? amLODIPine-atorvastatin (CADUET) 10-10 mg per tablet      ??? atorvastatin (LIPITOR) 80 MG tablet Take 1 tablet (80 mg total) by mouth.     ??? bumetanide (BUMEX) 1 MG tablet Take 1 tablet (1 mg total) by mouth daily.     ??? chlorhexidine (PERIDEX) 0.12 % solution RINSE 15 ML S ONCE DAILY AFTER BREAKFAST FOLLOWING BRUSHING AND FLOSSING     ??? cholecalciferol, vitamin D3, 5,000 unit Tab Take 1 tablet (125 mcg total) by mouth.     ??? clopidogrel (PLAVIX) 75 mg tablet Take 1 tablet (75 mg total) by mouth.     ??? cyanocobalamin, vitamin B-12, 1,000 mcg TbER Take 1 tablet by mouth.     ??? dexAMETHasone (DECADRON) 6 MG tablet      ??? doxazosin (CARDURA) 4 MG tablet Take 1 tablet (4 mg total) by mouth nightly.  1   ??? famotidine (PEPCID) 40 MG tablet Take 1 tablet (40 mg total) by mouth.     ??? ferrous sulfate 15 mg iron/1.5 mL Susp Take 1 tablet by mouth.     ??? furosemide (LASIX) 20 MG tablet Take 2 tablets (40 mg total) by mouth.     ??? glipiZIDE (GLUCOTROL XL) 2.5 MG 24 hr tablet Take 1 tablet (2.5 mg total) by mouth daily.     ??? glipiZIDE (GLUCOTROL XL) 5 MG 24 hr tablet Take 1 tablet (5 mg total) by mouth daily.     ??? glucosamine/chondr su A sod (OSTEO BI-FLEX ORAL) Take by mouth.     ??? hydrALAZINE (APRESOLINE) 50 MG tablet Take 1 tablet (50 mg total) by mouth Three (3) times a day.     ??? ipratropium-albuterol (DUO-NEB) 0.5-2.5 mg/3 mL nebulizer GIVE 3 ML TWICE DAILY INHALATION 90 DAYS  3   ??? JARDIANCE 25 mg tablet Take 1 tablet (25 mg total) by mouth daily. Pt reports he is now taking 10mg      ??? JENTADUETO 2.5-500 mg Tab      ??? liraglutide (VICTOZA 2-PAK) 0.6 mg/0.1 mL (18 mg/3 mL) injection      ??? LIRAGLUTIDE SUBQ Inject 1.8 mg under the skin.     ??? losartan (COZAAR) 50 MG tablet Take 1 tablet (50 mg total) by mouth daily.  0   ??? metoprolol succinate (TOPROL-XL) 50  MG 24 hr tablet Take 1 tablet (50 mg total) by mouth. Pt reports he is taking 25mg  daily     ??? mometasone-formoterol 200-5 mcg/actuation HFAA Inhale 2 puffs two (2) times a day.     ??? sodium,potassium,mag sulfates (SUPREP BOWEL PREP KIT) 17.5-3.13-1.6 gram SolR Take as directed by GI 354 mL 0   ??? spironolactone (ALDACTONE) 25 MG tablet Take 1 tablet (25 mg total) by mouth every other day.     ??? sucralfate (CARAFATE) 100 mg/mL suspension TAKE 10 ML BY MOUTH TWO (2) TIMES A DAY. 420 mL 0   ??? TRUE METRIX LEVEL 2 Soln      ??? VICTOZA 3-PAK 0.6 mg/0.1 mL (18 mg/3 mL) injection 0.3 mL (1.8 mg total).  2   ??? vit C-bioflav-hesp-rutin-hb196 (BIOFLEX) 500-50-25-40 mg Tab Take by mouth.     ??? zafirlukast (ACCOLATE) 20 MG tablet Take 1 tablet (20 mg total) by mouth.       No current facility-administered medications for this visit.        Changes to medications: [name] reports no changes at this time.    No Known Allergies    Changes to allergies: No    SPECIALTY MEDICATION ADHERENCE     Calquence 100 mg: 14 days of medicine on hand     Medication Adherence    Patient reported X missed doses in the last month: 0  Specialty Medication: Calquence                      Specialty medication(s) dose(s) confirmed: Regimen is correct and unchanged.     Are there any concerns with adherence? No    Adherence counseling provided? Not needed    CLINICAL MANAGEMENT AND INTERVENTION      Clinical Benefit Assessment:    Do you feel the medicine is effective or helping your condition? Yes    Clinical Benefit counseling provided? Not needed    Adverse Effects Assessment:    Are you experiencing any side effects? No    Are you experiencing difficulty administering your medicine? No    Quality of Life Assessment:    Quality of Life      Oncology  1. What impact has your specialty medication had on the reduction of your daily pain or discomfort level?: None  2. On a scale of 1-10, how would you rate your ability to manage side effects associated with your specialty medication? (1=no issues, 10 = unable to take medication due to side effects): 1            How many days over the past month did your condition/medication  keep you from your normal activities? For example, brushing your teeth or getting up in the morning. 0    Have you discussed this with your provider? Not needed    Acute Infection Status:    Acute infections noted within Epic:  No active infections  Patient reported infection: None    Therapy Appropriateness:    Is therapy appropriate and patient progressing towards therapeutic goals? Yes, therapy is appropriate and should be continued    DISEASE/MEDICATION-SPECIFIC INFORMATION      N/A    Oncology: Is the patient receiving adequate infection prevention treatment? Not applicable  Does the patient have adequate nutritional support? Not applicable    PATIENT SPECIFIC NEEDS     - Does the patient have any physical, cognitive, or cultural barriers? No    - Is the patient high risk?  Yes, patient is taking oral chemotherapy. Appropriateness of therapy as been assessed    - Did the patient require a clinical intervention? No    - Does the patient require physician intervention or other additional services (i.e., nutrition, smoking cessation, social work)? No    SOCIAL DETERMINANTS OF HEALTH     At the [facility], we have learned that life circumstances - like trouble affording food, housing, utilities, or transportation can affect the health of many of our patients.   That is why we wanted to ask: are you currently experiencing any life circumstances that are negatively impacting your health and/or quality of life? No    Social Determinants of Health     Financial Resource Strain: Not on file   Internet Connectivity: Not on file   Food Insecurity: Not on file   Tobacco Use: Low Risk  (06/28/2022)    Patient History    ??? Smoking Tobacco Use: Never    ??? Smokeless Tobacco Use: Never    ??? Passive Exposure: Not on file   Housing/Utilities: Not on file   Alcohol Use: Not on file   Transportation Needs: Not on file   Substance Use: Not on file   Health Literacy: Not on file   Physical Activity: Not on file   Interpersonal Safety: Not on file   Stress: Not on file   Intimate Partner Violence: Not on file   Depression: Not on file   Social Connections: Not on file       Would you be willing to receive help with any of the needs that you have identified today? No       SHIPPING     Specialty Medication(s) to be Shipped:   Hematology/Oncology: Calquence    Other medication(s) to be shipped: No additional medications requested for fill at this time     Changes to insurance: No    Delivery Scheduled: Yes, Expected medication delivery date: 08/06/22.     Medication will be delivered via Next Day Courier to the confirmed prescription address in [facility].    The patient will receive a drug information handout for each medication shipped and additional FDA Medication Guides as required.  Verified that patient has previously received a Conservation officer, historic buildings and a Surveyor, mining.    The patient or caregiver noted above participated in the development of this care plan and knows that they can request review of or adjustments to the care plan at any time.      All of the patient's questions and concerns have been addressed.    Rollen Sox, [facility]   [facility] Specialty Pharmacist

## 2022-07-30 MED ORDER — SUCRALFATE 100 MG/ML ORAL SUSPENSION
0 refills | 0 days
Start: 2022-07-30 — End: ?

## 2022-08-01 DIAGNOSIS — D509 Iron deficiency anemia, unspecified: Principal | ICD-10-CM

## 2022-08-02 MED ORDER — SUCRALFATE 100 MG/ML ORAL SUSPENSION
0 refills | 0 days | Status: CP
Start: 2022-08-02 — End: ?

## 2022-08-03 ENCOUNTER — Ambulatory Visit: Admit: 2022-08-03 | Discharge: 2022-08-04 | Payer: MEDICARE

## 2022-08-03 NOTE — Unmapped (Addendum)
7 days before procedure STOP taking:    FOLIC ACID, VITAMIN D3, OSTEO BI-FLEX      Procedure Day:     DECEMBER, 12 @ 8:30 AM.     Be at the [facility] at 6:30 AM .  Arrive on time. These times are subject to change at any time, including the day of surgery.        Do not eat after midnight - this includes all food as well as candy, gum and mints. You may have up to 12 ounces of the following clear liquids between midnight and 3 hours prior to your surgery start time (no liquids after 5:30 AM):    Water    Apple juice       White grape juice   Clear/yellow/green non-carbonated sports drinks   Tea or coffee. No cream, milk or powdered creamer. You may add sugar or sugar substitute.  No honey.    Take ONLY these medications on the morning of surgery and no others:    METOPROLOL  DULERA  ZAFIRULUKAST  CALQUENCE  TYLENOL (as needed for pain)  ALBUTEROL (as needed for wheezing)    General Instructions:__    Call [phone], choose prompt # 6, Monday - Friday from 8:00 am - 4:00 pm if you have questions regarding these instructions.    Call your gastroenterologist's or pulmonologist's office if you become sick (such as fever or cough), have a new rash or wound, have changes in your health or need to reschedule your procedure.  Call your gastroenterologist's office if you do not have your bowel prep for your colonoscopy.  You may have two visitors at a time while in the hospital.     Your support person (a responsible adult age 9 or older) must be present at the hospital before, during and after your procedure..  A support person (a responsible adult age 15 or over) must drive you home and stay with you for the next 24 hours after anesthesia.   Ride services like Lyft, [employer], taxi, bus, etc., can only be used if the responsible adult accompanies you.    14 days before procedure:    Please consult with your ordering provider if you take any of the following or any MAOI medications:  Marplan (Isocarboxazid), Nardil (phenelzine), Eldepryl (selegiline), Zelapar (selegiline), Parnate (tranylcypromine), Azilect (rasagiline).    These medications can have serious interactions with anesthesia medications.    7 days before procedure:    Stop taking NSAID medicines.   Examples: Advil, Motrin, Ibuprofen, Aleve, Naproxen, Mobic, Meloxicam, Voltaren, Diclofenac, Etodalac, Excedrin, BC Powder, etc.    Stop taking weight loss medicines that contain stimulants.  Examples:  Phentermine (Adipex, Suprenza) or Phentermine/Topiramate (Qsymia), etc.  Stop taking herbal supplements.  Examples:  gingko biloba, echinacea, St John's Wort, etc.  Stop taking all vitamins.  Examples:  multivitamins, vitamin D, vitamin E, etc.  Stop taking all dietary supplements.  Examples:  fish oil, krill oil, garlic capsules, turmeric, etc.   Stop drinking herbal teas Examples:  green tea, Celestial Seasonings, etc.    24 hours before:    If you are having a colonoscopy, do not eat solid food after you begin your bowel preparation.  Do not shave or wax your procedure area within 24 hours before procedure.  Do not smoke, use ecigarettes, use illegal drugs or drink alcohol within 24 hours before procedure.    Procedure Day:    Remove all of your jewelry (including piercings and  wedding rings) and leave it at home   Take a shower with an antibacterial soap such as Dial or Safeguard  After showering, do not apply anything on your skin such as makeup, deodorant, lotion, powder, etc.   Put on clean, loose, comfortable clothing  Brush your teeth  If you wear glasses, hearing aides or dentures, please bring their cases with you.       Managing pain after the procedure:    Esophagogastroduodenoscopy's (upper endoscopy, EGD) and Colonoscopy's are normally not painful, but please tell your nurse if you are uncomfortable. It is our goal to provide pain relief and comfort after your procedure. To do so, you and our staff need to be able to clearly communicate about your pain. We use the scale below to help evaluate your comfort level.  We want you to talk about your discomfort with your nurse and ask for help to manage it. There are many options your nurse will have to help with pain management such as medicines, repositioning, ice, etc.         What is anesthesia?    Anesthesia is a way to sedate patients as well as to control pain during surgery or procedures.  The medications given to you while you are asleep help control your breathing,  blood pressure, blood flow, heart rate and rhythm.    What determines the type of anesthesia that is best for you?    Past and current health status  Type of surgery or procedure  Results of blood tests, EKG and other health studies    Types of Anesthesia:    GENERAL ANESTHESIA   You are unconscious and have no awareness  or other sensations  You may receive medicine through an IV or breathe them through a mask Possible Side Effects    Nausea and vomiting  Sore throat  Dry mouth Metallic taste  Sleepiness  Shivering   MONITORED ANESTHESIA CARE (MAC)   You will receive sedative medications through your IV which may make you drowsy or fall asleep  You may not remember your procedure due to the medications given during your procedure Possible Side Effects  Nausea and vomiting  Sore throat  Sleepiness   REGIONAL ANESTHESIA   Blocks sensation of pain to a large area of  the body  First, you may be given some medicine through your IV to help relax  Then, a shot of medication is injected around specific nerves to block pain  This medicine may keep you pain free or  lessen the pain for 12-72 hours   Possible Side Effects    Headache  Difficulty urinating  Back pain   Discolored or swollen area  Temporary weakness in arm or legs       I

## 2022-08-04 ENCOUNTER — Ambulatory Visit: Admit: 2022-08-04 | Discharge: 2022-08-05 | Payer: MEDICARE

## 2022-08-04 DIAGNOSIS — C9110 Chronic lymphocytic leukemia of B-cell type not having achieved remission: Principal | ICD-10-CM

## 2022-08-04 LAB — COMPREHENSIVE METABOLIC PANEL
ALBUMIN: 3.1 g/dL — ABNORMAL LOW (ref 3.5–5.0)
ALKALINE PHOSPHATASE: 130 U/L — ABNORMAL HIGH (ref 46–116)
ALT (SGPT): 21 U/L (ref 12–78)
ANION GAP: 10 mmol/L (ref 3–11)
AST (SGOT): 14 U/L — ABNORMAL LOW (ref 15–40)
BILIRUBIN TOTAL: 0.3 mg/dL (ref 0.2–1.0)
BLOOD UREA NITROGEN: 43 mg/dL — ABNORMAL HIGH (ref 8–20)
BUN / CREAT RATIO: 28
CALCIUM: 8.7 mg/dL (ref 8.5–10.1)
CHLORIDE: 103 mmol/L (ref 98–107)
CO2: 30.5 mmol/L (ref 21.0–32.0)
CREATININE: 1.55 mg/dL — ABNORMAL HIGH (ref 0.80–1.30)
EGFR CKD-EPI (2021) MALE: 44 mL/min/{1.73_m2} — ABNORMAL LOW (ref >=60)
GLUCOSE RANDOM: 331 mg/dL — ABNORMAL HIGH (ref 70–179)
POTASSIUM: 3.5 mmol/L (ref 3.5–5.0)
PROTEIN TOTAL: 5.7 g/dL — ABNORMAL LOW (ref 6.0–8.0)
SODIUM: 143 mmol/L (ref 135–145)

## 2022-08-04 LAB — CBC W/ AUTO DIFF
BASOPHILS ABSOLUTE COUNT: 0 10*9/L (ref 0.0–0.1)
BASOPHILS RELATIVE PERCENT: 0.7 %
EOSINOPHILS ABSOLUTE COUNT: 0 10*9/L (ref 0.0–0.5)
EOSINOPHILS RELATIVE PERCENT: 0.4 %
HEMATOCRIT: 31.7 % — ABNORMAL LOW (ref 39.0–48.0)
HEMOGLOBIN: 9.9 g/dL — ABNORMAL LOW (ref 12.9–16.5)
LYMPHOCYTES ABSOLUTE COUNT: 2 10*9/L (ref 1.1–3.6)
LYMPHOCYTES RELATIVE PERCENT: 39.7 %
MEAN CORPUSCULAR HEMOGLOBIN CONC: 31.3 g/dL — ABNORMAL LOW (ref 32.0–36.0)
MEAN CORPUSCULAR HEMOGLOBIN: 24.8 pg — ABNORMAL LOW (ref 25.9–32.4)
MEAN CORPUSCULAR VOLUME: 79.2 fL (ref 77.6–95.7)
MEAN PLATELET VOLUME: 8.2 fL (ref 6.8–10.7)
MONOCYTES ABSOLUTE COUNT: 0.6 10*9/L (ref 0.3–0.8)
MONOCYTES RELATIVE PERCENT: 11.2 %
NEUTROPHILS ABSOLUTE COUNT: 2.4 10*9/L (ref 1.8–7.8)
NEUTROPHILS RELATIVE PERCENT: 48 %
PLATELET COUNT: 94 10*9/L — ABNORMAL LOW (ref 150–450)
RED BLOOD CELL COUNT: 4 10*12/L — ABNORMAL LOW (ref 4.26–5.60)
RED CELL DISTRIBUTION WIDTH: 17.5 % — ABNORMAL HIGH (ref 12.2–15.2)
WBC ADJUSTED: 5 10*9/L (ref 3.6–11.2)

## 2022-08-04 LAB — IRON & TIBC
IRON SATURATION: 8 % — ABNORMAL LOW (ref 20–55)
IRON: 21 ug/dL — ABNORMAL LOW (ref 50–150)
TOTAL IRON BINDING CAPACITY: 270 ug/dL (ref 250–450)

## 2022-08-04 LAB — FERRITIN: FERRITIN: 41 ng/mL (ref 10.5–307.3)

## 2022-08-05 LAB — IGG: GAMMAGLOBULIN; IGG: 314 mg/dL — ABNORMAL LOW (ref 646–2013)

## 2022-08-05 MED ORDER — CLENPIQ 10 MG-3.5 GRAM-12 GRAM/160 ML ORAL SOLUTION
0 refills | 0 days | Status: CP
Start: 2022-08-05 — End: ?

## 2022-08-05 MED ORDER — SUCRALFATE 100 MG/ML ORAL SUSPENSION
5 refills | 0 days
Start: 2022-08-05 — End: ?

## 2022-08-05 NOTE — Unmapped (Signed)
Pt sent message, Nulytely is not covered but clenpiq is. Escribed clenpiq to pt pharmacy on file and new prep instructions sent to pt via mychart.

## 2022-08-05 NOTE — Unmapped (Signed)
Noted  

## 2022-08-05 NOTE — Unmapped (Signed)
Triage vm from pt's dtr, says prep not covered by ins and [facility] sent [place] a request to chg it.        Rtn call to her and advised nulytely escribed and mychart msg sent with new prep info.  She vu.

## 2022-08-05 NOTE — Unmapped (Signed)
Your labs from today:     Latest Reference Range & Units 08/04/22 15:25   WBC 3.6 - 11.2 10*9/L 5.0   RBC 4.26 - 5.60 10*12/L 4.00 (L)   HGB 12.9 - 16.5 g/dL 9.9 (L)   HCT 16.1 - 09.6 % 31.7 (L)   MCV 77.6 - 95.7 fL 79.2   MCH 25.9 - 32.4 pg 24.8 (L)   MCHC 32.0 - 36.0 g/dL 04.5 (L)   RDW 40.9 - 81.1 % 17.5 (H)   MPV 6.8 - 10.7 fL 8.2   Platelet 150 - 450 10*9/L 94 (L)   Neutrophils % % 48.0   Lymphocytes % % 39.7   Monocytes % % 11.2   Eosinophils % % 0.4   Basophils % % 0.7   Absolute Neutrophils 1.8 - 7.8 10*9/L 2.4   Absolute Lymphocytes 1.1 - 3.6 10*9/L 2.0   Absolute Monocytes  0.3 - 0.8 10*9/L 0.6   Absolute Eosinophils 0.0 - 0.5 10*9/L 0.0   Absolute Basophils  0.0 - 0.1 10*9/L 0.0   Anisocytosis Not Present  Slight !   Hypochromasia Not Present  Moderate !   Sodium 135 - 145 mmol/L 143   Potassium 3.5 - 5.0 mmol/L 3.5   Chloride 98 - 107 mmol/L 103   CO2 21.0 - 32.0 mmol/L 30.5   Bun 8 - 20 mg/dL 43 (H)   Creatinine 9.14 - 1.30 mg/dL 7.82 (H)   BUN/Creatinine Ratio  28   eGFR CKD-EPI (2021) Male >=60 mL/min/1.66m2 44 (L)   Anion Gap 3 - 11 mmol/L 10   Glucose 70 - 179 mg/dL 956 (H)   Calcium 8.5 - 10.1 mg/dL 8.7   Albumin 3.5 - 5.0 g/dL 3.1 (L)   Total Protein 6.0 - 8.0 g/dL 5.7 (L)   Total Bilirubin 0.2 - 1.0 mg/dL 0.3   SGOT (AST) 15 - 40 U/L 14 (L)   ALT 12 - 78 U/L 21   Alkaline Phosphatase 46 - 116 U/L 130 (H)   Iron 50 - 150 ug/dL 21 (L)   TIBC 213 - 086 ug/dL 578   Iron Saturation (%) 20 - 55 % 8 (L)   Ferritin 10.5 - 307.3 ng/mL 41.0   (L): Data is abnormally low  (H): Data is abnormally high  !: Data is abnormal

## 2022-08-09 NOTE — Unmapped (Signed)
HEMATOLOGY ONCOLOGY CLINIC:  INTERVAL FOLLOW UP  NOTE    PCP:  Arturo Morton, MD    DATE OF ENCOUNTER:  08/04/2022    INTERVAL HISTORY:  Christian Abbott is an 87 y.o. male who comes today for continued follow-up of chronic lymphocytic leukemia.     Given his advanced disease he was started on acalbrutinib 12/19/20.    Patient returns accompanied by his daughter today.  Today WBC is 5.0, ALC normal at 2.0, hemoglobin 9.9 stable from 07/21/2022 which was 9.9.  Platelets stable at 94.  Improved from 77 on 07/08/2022.  Patient is planned for a EGD and colonoscopy to evaluate for cause of iron deficiency anemia within the next 2 weeks.  He denies stools, abdominal pain, reflux, hematemesis or melena    ONCOLOGY HISTORY  Oncology History Overview Note   87 y/o male with long-standing history of chronic lymphocytic leukemia initially diagnosed in 1998  Status post Rituxan based therapy for 8 weeks during that time  History of anemia treated with Procrit off and on  History of hypertension diabetes asthma hypercholesterolemia.  History of hospitalization in January 2014 for bilateral pneumonia [facility] for 3 weeks.     CLL (chronic lymphocytic leukemia) (CMS-HCC)   07/18/1997 Initial Diagnosis    CLL (chronic lymphocytic leukemia)           PAST MEDICAL HISTORY:     Patient Active Problem List   Diagnosis    CLL (chronic lymphocytic leukemia) (CMS-HCC)    Iron deficiency anemia    Hypogammaglobulinemia (CMS-HCC)    Anemia    Need for prophylactic immunotherapy      has a past surgical history that includes Colonoscopy (2021); Esophagogastroduodenoscopy; Inguinal hernia repair; Cervical spine surgery (2012); Coronary angioplasty with stent; and Cataract extraction, bilateral.    ALLERGIES:  has No Known Allergies.      MEDICATIONS: has a current medication list which includes the following prescription(s): acalabrutinib, accu-chek aviva plus test strp, accu-chek guide me glucose mtr, accu-chek softclix lancets, albuterol 2.5 mg /3 mL (0.083 %) Nebu 3 mL, albuterol 5 mg/mL Nebu 0.5 mL, atorvastatin, bumetanide, cholecalciferol (vitamin d3-125 mcg (5,000 unit)), clopidogrel, doxazosin, famotidine, folic acid, glipizide, glipizide, glucosamine-chondroitin, hydralazine, ipratropium-albuterol, jardiance, victoza 2-pak, metoprolol succinate, dulera, sucralfate, vit c/e/zn/coppr/lutein/zeaxan, zafirlukast, and clenpiq.    FAMILY HISTORY / SOCIAL HISTORY:  Reviewed and updated as appropriate.    REVIEW OF SYSTEMS:  As per interval history.  All other systems reviewed and negative.    PHYSICAL EXAM    Vitals: BP 154/66  - Pulse 70  - Temp 36.9 ??C (98.5 ??F) (Temporal)  - Resp 17  - Ht 160 cm (5' 3)  - Wt 66.7 kg (147 lb)  - SpO2 96%  - BMI 26.04 kg/m??   General:  Pleasant, no acute distress.    Pain:  Pain Evaluation:                                   Pain Score (0 - 1):                                Pain Location:  Eyes:  Appears normal.   HENT:  Atraumatic.   Neck:  thyroid midline.  Lymphatics: Bilateral small cervical lymph nodes.  No supraclavicular axillary or inguinal lymphadenopathy   Cardiovascular:   RRR, trace edema b/l ankles.   Respiratory:  Unlabored.  Good air entry b/l. No wheezing.  CTABL   Gastrointestinal: Does not appear distended.  Nontender. No palpable HSM.   Skin:  No rashes or subcutaneous nodules.    Musculoskeletal:  Gait is steady     Psychiatric:  Affect appropriate. Judgment and insight nl.  Neurologic:  Alert and oriented x3. Grossly non-focal.              RECENT LABS/PATHOLOGY:  Results for orders placed or performed in visit on 08/04/22   Comprehensive Metabolic Panel   Result Value Ref Range    Sodium 143 135 - 145 mmol/L    Potassium 3.5 3.5 - 5.0 mmol/L    Chloride 103 98 - 107 mmol/L    CO2 30.5 21.0 - 32.0 mmol/L    Anion Gap 10 3 - 11 mmol/L    BUN 43 (H) 8 - 20 mg/dL    Creatinine 1.61 (H) 0.80 - 1.30 mg/dL    BUN/Creatinine Ratio 28 eGFR CKD-EPI (2021) Male 44 (L) >=60 mL/min/1.42m2    Glucose 331 (H) 70 - 179 mg/dL    Calcium 8.7 8.5 - 09.6 mg/dL    Albumin 3.1 (L) 3.5 - 5.0 g/dL    Total Protein 5.7 (L) 6.0 - 8.0 g/dL    Total Bilirubin 0.3 0.2 - 1.0 mg/dL    AST 14 (L) 15 - 40 U/L    ALT 21 12 - 78 U/L    Alkaline Phosphatase 130 (H) 46 - 116 U/L   Iron & TIBC   Result Value Ref Range    Iron 21 (L) 50 - 150 ug/dL    TIBC 045 409 - 811 ug/dL    Iron Saturation (%) 8 (L) 20 - 55 %   Ferritin   Result Value Ref Range    Ferritin 41.0 10.5 - 307.3 ng/mL   IgG   Result Value Ref Range    Total IgG 314 (L) 646 - 2,013 mg/dL   CBC w/ Differential   Result Value Ref Range    WBC 5.0 3.6 - 11.2 10*9/L    RBC 4.00 (L) 4.26 - 5.60 10*12/L    HGB 9.9 (L) 12.9 - 16.5 g/dL    HCT 91.4 (L) 78.2 - 48.0 %    MCV 79.2 77.6 - 95.7 fL    MCH 24.8 (L) 25.9 - 32.4 pg    MCHC 31.3 (L) 32.0 - 36.0 g/dL    RDW 95.6 (H) 21.3 - 15.2 %    MPV 8.2 6.8 - 10.7 fL    Platelet 94 (L) 150 - 450 10*9/L    Neutrophils % 48.0 %    Lymphocytes % 39.7 %    Monocytes % 11.2 %    Eosinophils % 0.4 %    Basophils % 0.7 %    Absolute Neutrophils 2.4 1.8 - 7.8 10*9/L    Absolute Lymphocytes 2.0 1.1 - 3.6 10*9/L    Absolute Monocytes 0.6 0.3 - 0.8 10*9/L    Absolute Eosinophils 0.0 0.0 - 0.5 10*9/L    Absolute Basophils 0.0 0.0 - 0.1 10*9/L    Anisocytosis Slight (A) Not Present    Hypochromasia Moderate (A) Not Present     RADIOLOGY      08/13/21  CT chest   IMPRESSION:  1. Overall improved aeration in the bilateral lungs, suggesting improved infectious or inflammatory pneumonitis since prior examination. Persistent linear and subpleural reticular opacities bilaterally, suggestive of residual disease.  2. Scattered micronodules bilaterally, unchanged since recent prior examinations. No new, or enlarging nodules are noted. Recommend follow-up examination in 6-12 months based on Fleischner Society criteria.  3. Mediastinal and hilar lymph nodes appear relatively unchanged to slightly improved in the interval. No new or enlarging adenopathy.    ASSESSMENT and PLAN:    # Chronic lymphocytic leukemia  --Rai stage IV CLL  --CLL FISH panel shows 13 q. deletion 21.5%, IGHV mutated -good prognostic features  --11/05/2020: TP53 mutation negative.  NOTCH1 negative.  SF3B1 positive.  SF3B1 positivity indicates poor prognosis.  -- no evidence of hemolysis.   --Indication for treatment:  advanced stage and history of thrombocytopenia  -- He has been treated with Rituxan many years ago by Dr Thedore Mins, with good response in the 1990s. Treatment indication at that time may have been frequent infections.  -- started acabrutinib 12/19/20  -- Reviewed cbc CMP.  ALC remains normal. Hb improved to 10.7. plt down to 96.     PLAN:  -- continue Acalbrutinib 100 mg BID.   -- Due for IVIG approximately 1-1/2 months the end of January 2023.  Will schedule at next visit.        # Patchy lung opacities  -- most recent CT chest from 05/16/2021 mixed response with regardin the GGOs.  He continues to have no infectious symptoms.  Patient is we would expect after his COVID diagnosis.  He followed up with Dr. Virgia Land recently.  Findings on his CT chest are felt to represent volume overload as noted by a recent elevation in proBNP on 06/02/2021.  No pulmonary intervention was recommended by Dr. Virgia Land at this time.  --follow up CT chest 08/13/21 showed overall improved aeration. Small indeterminate micronodules at [place] and per radiology require 6-12 month follow up .   -- We will continue to monitor cliically and repeat CT chest in  In 07/2022  - ordered  -- he will continue following with Dr Ace Gins.     #Iron deficiency anemia  -- He was noted to have iron deficiency in 2014 and has since received IV iron in the past and has been on oral iron 1 pill/day.   -- iron deficiency anemia/ACD in 07/2019 with hb down to 8.7 and ferritin 78, iron sat 12%  -- now s/p 1020 mg ferheme 07/2019 and early 10/2018 with hb improvement to 10.9 today. --EGD and colonoscopy with Dr. Loreta Ave 01/2020 revealed bleeding gastric ulcer, smaller antral ulcer with old blood. .  Currently being managed with sucralfate.  -- In June 2023 hemoglobin was down to 8.3.  He was given additional IV iron with Feraheme 510 mg x 2 doses  -- seen by Gi and planned for EGD , pending cardiology clearance  -- s/p feraheme 510 mg x 2 doses 9/28-10/9        # Hypogammaglobulinemia   --History level continues to be low however he has not had any more infections since we started him on IVIG. Before we started him on IVIG he has had multiple hospitalizations for pneumonias.  --Therefore we will continue IVIG for now every 3 months as he is tolerated this well.  --  next IVIG schedule for  today.          # Health maintenance:  --  Evusheld  #  1 12/04/20   -- bivalent booster  06/01/21  -- influenza vaccine 06/01/21  --evusheld  #2 07/01/21          # diastolic CHF with volume overload  -- followed closely by Cardiology.recently switched to bumex.   -- diuresis has bee associated with some degree of renal dysfunction in the last 5-6 months  -- he will continue following closely with Cardiology    # CKD Stage IV  -- Following closely with [facility].  -- Cr stable      DISPO:  RTC in 4 weeks for MD visit   .     I have independently reviewed notes from prior visits, results from labs and imaging.  The current plan of care requires intensive monitoring for toxicity due to use of antineoplastic treatments.  Medical decision making was of high complexity due to to ongoing risk of morbidity/mortality from antineoplastic treatment.      Elyse Hsu, MD  [facility]

## 2022-08-10 ENCOUNTER — Ambulatory Visit: Admit: 2022-08-10 | Discharge: 2022-08-10 | Payer: MEDICARE

## 2022-08-10 ENCOUNTER — Encounter: Admit: 2022-08-10 | Discharge: 2022-08-10 | Payer: MEDICARE | Attending: Anesthesiology | Primary: Anesthesiology

## 2022-08-10 MED ADMIN — dexAMETHasone (DECADRON) 4 mg/mL injection: INTRAVENOUS | @ 16:00:00 | Stop: 2022-08-10

## 2022-08-10 MED ADMIN — succinylcholine chloride (ANECTINE) injection: INTRAVENOUS | @ 15:00:00 | Stop: 2022-08-10

## 2022-08-10 MED ADMIN — lactated Ringers infusion: 40 mL/h | INTRAVENOUS | @ 14:00:00 | Stop: 2022-08-10

## 2022-08-10 MED ADMIN — diphenhydrAMINE (BENADRYL) injection: INTRAVENOUS | @ 16:00:00 | Stop: 2022-08-10

## 2022-08-10 MED ADMIN — midazolam (VERSED) injection: INTRAVENOUS | @ 15:00:00 | Stop: 2022-08-10

## 2022-08-10 MED ADMIN — Propofol (DIPRIVAN) injection: INTRAVENOUS | @ 15:00:00 | Stop: 2022-08-10

## 2022-08-10 MED ADMIN — lidocaine (PF) (XYLOCAINE) injection: INTRAVENOUS | @ 15:00:00 | Stop: 2022-08-10

## 2022-08-10 MED ADMIN — ondansetron (ZOFRAN) injection: INTRAVENOUS | @ 16:00:00 | Stop: 2022-08-10

## 2022-08-10 MED ADMIN — hydrALAZINE (APRESOLINE) injection 10 mg: 10 mg | INTRAVENOUS | @ 17:00:00 | Stop: 2022-08-10

## 2022-08-10 MED ADMIN — hydrALAZINE (APRESOLINE) tablet 50 mg: 50 mg | ORAL | @ 19:00:00 | Stop: 2022-08-10

## 2022-08-10 NOTE — Unmapped (Signed)
See Provation procedure note located in RESULTS REVIEWED section of chart under GI procedures.    Full report with photographs is in the MEDIA section of EP[facility].  FOR REFERRING PHYSICIANS OUTSIDE OF [place]/[facility], A FULL REPORT WITH PATHOLOGY (IF BIOPSIES TAKEN) WILL FOLLOW IN 1-2 WEEKS.

## 2022-08-10 NOTE — Unmapped (Signed)
PRE-PROCEDURE HISTORY AND PHYSICAL EXAM    Christian Abbott presents for his scheduled COLONOSCOPY  ESOPHAGOGASTRODUODENOSCOPY.    The indication for the procedure(s) is IDA.    There have been no significant recent changes in the patient's medical status.    Dr Jefferey Pica 05/2022            Assessment & Recommendations:      Christian Abbott is a 87 y.o. male who presents for iron deficiency anemia, CLL, coronary disease on Plavix, thrombocytopenia, GERD  -Patient with history of iron deficiency anemia  -Previous EGD and colonoscopy with erosive gastropathy but otherwise negative  -Does take Plavix and history of chronic thrombocytopenia  -No evidence of active GI bleeding  -We will plan to repeat EGD and colonoscopy at [facility] to ensure there are no GI pathologies contributing patient's anemia  -If negative will plan for capsule enteroscopy  -Okay to continue sucralfate for reflux at this time  -If needed will plan to resume PPI versus H2 blocker and adjust as needed based on chemo schedule  -Further recommendations based on endoscopic evaluation  -Follow-up as needed     PLAN:  Patient Instructions   We will do an EGD and colonoscopy for you pending clearance from your cardiologist  If these are negative we will consider doing a capsule enteroscopy  Continue sucralfate for acid reflux  Please call with any questions or concerns     TOTAL VISIT TIME:  I personally spent 80 minutes face-to-face and non-face-to-face in the care of this patient, which includes all pre, intra, and post visit time on the date of service.     ________________________________  Catalina Pizza, MD  [facility]    Lab Results   Component Value Date    WBC 5.0 08/04/2022    HGB 9.9 (L) 08/04/2022    HCT 31.7 (L) 08/04/2022    PLT 94 (L) 08/04/2022       Past Medical History:   Diagnosis Date    Anemia     Asthma     Cancer (CMS-HCC)     CHF (congestive heart failure) (CMS-HCC)     CLL (chronic lymphocytic leukemia) (CMS-HCC) Diabetes mellitus (CMS-HCC)     Generalized weakness     Hypertension     Macular degeneration     Stomach ulcer     Stroke (CMS-HCC) 2008    Wheelchair dependent      Past Surgical History:   Procedure Laterality Date    CATARACT EXTRACTION, BILATERAL      CERVICAL SPINE SURGERY  2012    COLONOSCOPY  2021    CORONARY ANGIOPLASTY WITH STENT PLACEMENT      ESOPHAGOGASTRODUODENOSCOPY      INGUINAL HERNIA REPAIR         Allergies  No Known Allergies    Medications  acalabrutinib, (albuterol 2.5 mg /3 mL (0.083 %) Nebu 3 mL, albuterol 5 mg/mL Nebu 0.5 mL), atorvastatin, blood sugar diagnostic, blood-glucose meter, bumetanide, (cholecalciferol (vitamin D3-125 mcg (5,000 unit))), clopidogreL, doxazosin, empagliflozin, famotidine, folic acid, glipiZIDE, glucosamine-chondroitin, hydrALAZINE, ipratropium-albuteroL, lancets, liraglutide, metoPROLOL succinate, mometasone-formoterol, sod picosulf-mag ox-citric ac, sucralfate, vit C/E/Zn/coppr/lutein/zeaxan, and zafirlukast    Physical Examination  There were no vitals filed for this visit.  There is no height or weight on file to calculate BMI.    Mental Status: AAOx3, thoughts organized     Lungs: Clear to auscultation, unlabored breathing     Heart: Regular rate and rhythm, normal S1 and S2, no murmur  Abdomen: Soft, non-tender, non-distended         ASSESSMENT AND PLAN  Mr. Speros has been evaluated and deemed appropriate to undergo the planned COLONOSCOPY  ESOPHAGOGASTRODUODENOSCOPY. Under MAC sedation     The patient, or his authorized representative, was provided a printed handout that explained the nature and benefits of the procedure(s), the most frequent risks, and alternatives, if any.  I personally reviewed this information with the patient, or his authorized representative, and answered all questions.     I HAVE REVIEWED THE CASE AND MY PREVIOUS NOTES/ENCOUNTERS WITH THE PATIENT.  WE DISCUSSED COVID 19 AND CONCLUDED THAT GIVEN THE SYMPTOMS, THIS IS A PRIORITY CASE AND THE BENEFITS OF MOVING FORWARD OUTWEIGH THE RISKS AND THE PATIENT HAS TESTED NEGATIVE FOR COVID 19.

## 2022-08-11 NOTE — Unmapped (Addendum)
Received VM from daughter, Lissa Hoard, who said that patient had colonoscopy yesterday and it was not good. They are still waiting on pathology.      Patient is scheduled for CT Chest on Friday, but would like to cancel if he is going to need other diagnostic scans; Lissa Hoard wants to know if he can cancel, or if he should get CT done.  She also would like to know next steps.    Encounter routed to provider.    Provider stated that he wants CT Chest to be completed.    Called and spoke with Lissa Hoard and told her that CT Chest appt should not be canceled, patient needs scan. Sonia verbalized understanding. Lissa Hoard stated that patient will be in clinic on 08/17/22 @ 1000 for iron infusion; if provider has updates they would appreciate him stopping by. CM said she would let provider know, but cannot guarantee his availability at that time.

## 2022-08-12 DIAGNOSIS — C189 Malignant neoplasm of colon, unspecified: Principal | ICD-10-CM

## 2022-08-12 MED ORDER — FLUCONAZOLE 200 MG TABLET
ORAL_TABLET | Freq: Every day | ORAL | 0 refills | 14 days | Status: CP
Start: 2022-08-12 — End: 2022-08-26

## 2022-08-12 NOTE — Unmapped (Addendum)
Fluconazole escribed as requested.     ----- Message from Celesta Aver, MD sent at 08/12/2022  7:31 AM EST -----  Results discussed with patient and his daughter. Pt.'s primary Oncologist Dr Jomarie Longs aware as well and will pursue further management from here on.     For candida esophagitis, needs 2 weeks of Fluconazole 200mg  daily.

## 2022-08-12 NOTE — Unmapped (Addendum)
Provider decided to do CT a/p w contrast in order to expedite staging since patient cannot have MRI for a month due to GI clip.     CT a/p with contrast scheduled for tomorrow following CT chest wo contrast at [facility] for 3:20. NPO 5 hours prior.  Authorization requested.    MyChart message sent to patient.    CT a/p authorized.

## 2022-08-13 ENCOUNTER — Ambulatory Visit: Admit: 2022-08-13 | Discharge: 2022-08-14 | Payer: MEDICARE

## 2022-08-13 DIAGNOSIS — C189 Malignant neoplasm of colon, unspecified: Principal | ICD-10-CM

## 2022-08-13 DIAGNOSIS — C9110 Chronic lymphocytic leukemia of B-cell type not having achieved remission: Principal | ICD-10-CM

## 2022-08-13 LAB — CBC W/ AUTO DIFF
BASOPHILS ABSOLUTE COUNT: 0 10*9/L (ref 0.0–0.1)
BASOPHILS RELATIVE PERCENT: 0.4 %
EOSINOPHILS ABSOLUTE COUNT: 0 10*9/L (ref 0.0–0.5)
EOSINOPHILS RELATIVE PERCENT: 0.5 %
HEMATOCRIT: 30.9 % — ABNORMAL LOW (ref 39.0–48.0)
HEMOGLOBIN: 9.7 g/dL — ABNORMAL LOW (ref 12.9–16.5)
LYMPHOCYTES ABSOLUTE COUNT: 2.8 10*9/L (ref 1.1–3.6)
LYMPHOCYTES RELATIVE PERCENT: 50.1 %
MEAN CORPUSCULAR HEMOGLOBIN CONC: 31.3 g/dL — ABNORMAL LOW (ref 32.0–36.0)
MEAN CORPUSCULAR HEMOGLOBIN: 24.2 pg — ABNORMAL LOW (ref 25.9–32.4)
MEAN CORPUSCULAR VOLUME: 77.4 fL — ABNORMAL LOW (ref 77.6–95.7)
MEAN PLATELET VOLUME: 8.3 fL (ref 6.8–10.7)
MONOCYTES ABSOLUTE COUNT: 0.5 10*9/L (ref 0.3–0.8)
MONOCYTES RELATIVE PERCENT: 9.7 %
NEUTROPHILS ABSOLUTE COUNT: 2.2 10*9/L (ref 1.8–7.8)
NEUTROPHILS RELATIVE PERCENT: 39.3 %
PLATELET COUNT: 104 10*9/L — ABNORMAL LOW (ref 150–450)
RED BLOOD CELL COUNT: 3.99 10*12/L — ABNORMAL LOW (ref 4.26–5.60)
RED CELL DISTRIBUTION WIDTH: 16.5 % — ABNORMAL HIGH (ref 12.2–15.2)
WBC ADJUSTED: 5.6 10*9/L (ref 3.6–11.2)

## 2022-08-13 MED ORDER — CLOPIDOGREL 75 MG TABLET
ORAL_TABLET | Freq: Every day | ORAL | 0 refills | 30 days | Status: CP
Start: 2022-08-13 — End: 2022-09-12

## 2022-08-16 NOTE — Unmapped (Signed)
I called patient yesterday and discussed his staging CT scans which showed no definitive e/o metastatic disease. He had subcm indeterminate hypodensiites in the liver felt like likely be benign.   I recommended proceeding with colorectal surgery consultation.   I have called [facility] Navigation today and requested CRS consult.   Attempting to get him seen this week    Elyse Hsu, MD  [facility]

## 2022-08-16 NOTE — Unmapped (Signed)
HEMATOLOGY ONCOLOGY CLINIC:  INTERVAL FOLLOW UP  NOTE    PCP:  Arturo Morton, MD    DATE OF ENCOUNTER:  08/13/2022    INTERVAL HISTORY:  Christian Abbott is an 87 y.o. male who comes today for continued follow-up of chronic lymphocytic leukemia.     Given his advanced disease he was started on acalbrutinib 12/19/20.  Patient recently has been acutely experiencing worsening of his iron deficiency anemia.  He had an expedited EGD and colonoscopy recently which showed a adenocarcinoma within the transverse colon.  He is here to discuss this further with me.  He is accompanied by his daughter and his grandson.  Patient reports chronic fatigue.  Denies abdominal pain unintentional weight loss chest pain cough or shortness of breath.  He has a CT chest abdomen and pelvis scheduled today evening for staging purposes.  He denies constipation frankly bloody or melenic stools.        ONCOLOGY HISTORY  Oncology History Overview Note   87 y/o male with long-standing history of chronic lymphocytic leukemia initially diagnosed in 1998  Status post Rituxan based therapy for 8 weeks during that time  History of anemia treated with Procrit off and on  History of hypertension diabetes asthma hypercholesterolemia.  History of hospitalization in January 2014 for bilateral pneumonia [facility] for 3 weeks.     CLL (chronic lymphocytic leukemia) (CMS-HCC)   07/18/1997 Initial Diagnosis    CLL (chronic lymphocytic leukemia)           PAST MEDICAL HISTORY:     Patient Active Problem List   Diagnosis    CLL (chronic lymphocytic leukemia) (CMS-HCC)    Iron deficiency anemia    Hypogammaglobulinemia (CMS-HCC)    Anemia    Need for prophylactic immunotherapy      has a past surgical history that includes Colonoscopy (2021); Esophagogastroduodenoscopy; Inguinal hernia repair; Cervical spine surgery (2012); Coronary angioplasty with stent; Cataract extraction, bilateral; pr upper gi endoscopy,w/dir submuc inj (N/A, 08/10/2022); and pr upper gi endoscopy,biopsy (N/A, 08/10/2022).    ALLERGIES:  has No Known Allergies.      MEDICATIONS: has a current medication list which includes the following prescription(s): acalabrutinib, accu-chek aviva plus test strp, accu-chek guide me glucose mtr, accu-chek softclix lancets, albuterol 2.5 mg /3 mL (0.083 %) Nebu 3 mL, albuterol 5 mg/mL Nebu 0.5 mL, atorvastatin, cholecalciferol (vitamin d3-125 mcg (5,000 unit)), clopidogrel, doxazosin, famotidine, fluconazole, folic acid, glipizide, glipizide, glucosamine-chondroitin, hydralazine, ipratropium-albuterol, jardiance, victoza 2-pak, metoprolol succinate, dulera, clenpiq, sucralfate, vit c/e/zn/coppr/lutein/zeaxan, zafirlukast, and bumetanide.    FAMILY HISTORY / SOCIAL HISTORY:  Reviewed and updated as appropriate.    REVIEW OF SYSTEMS:  As per interval history.  All other systems reviewed and negative.    PHYSICAL EXAM    Vitals: BP 140/66  - Pulse 68  - Temp 36.8 ??C (98.2 ??F) (Temporal)  - Resp 17  - Ht 160 cm (5' 3)  - Wt 67.3 kg (148 lb 6.4 oz)  - SpO2 97%  - BMI 26.29 kg/m??   General:  Pleasant, no acute distress.    Pain:  Pain Evaluation:                                   Pain Score (0 - 1):  Pain Location:                                       Eyes:  Appears normal.   HENT:  Atraumatic.   Neck:  thyroid midline.  Lymphatics: Bilateral small cervical lymph nodes.  No supraclavicular axillary or inguinal lymphadenopathy   Cardiovascular:   RRR, trace edema b/l ankles.   Respiratory:  Unlabored.  Good air entry b/l. No wheezing.  CTABL   Gastrointestinal: Does not appear distended.  Nontender. No palpable HSM.   Skin:  No rashes or subcutaneous nodules.    Musculoskeletal:  Gait is steady     Psychiatric:  Affect appropriate. Judgment and insight nl.  Neurologic:  Alert and oriented x3. Grossly non-focal.              RECENT LABS/PATHOLOGY:  Results for orders placed or performed during the hospital encounter of 08/10/22   Surgical pathology exam   Result Value Ref Range    Diagnosis       A.  Stomach, random, biopsy:  -Mild chronic inactive gastritis.  -Negative for Helicobacter pylori organisms on immunohistochemical stain.    B.  Esophagus, biopsy:  -Acute esophagitis.  -PAS stain highlights rare fungal elements, suggestive of Candida species.    C.  Colon, transverse mass, biopsy:  -Invasive moderately differentiated adenocarcinoma with mucinous features.    This electronic signature is attestation that the pathologist personally reviewed the submitted material(s) and the final diagnosis reflects that evaluation.      Diagnosis Comment       Part C: MMR-IHC IHC stains performed at [facility] show the following:     MLH1:  Absent expression.  PMS2:  Absent expression.  MSH2:  Normal expression.  MSH6:  Normal expression.     Based on these findings and the established protocol at , an appropriate tumor block is selected and sent for BRAF mutation and MLH1 hypermethylation analyses.  When available, those results will be reported in an addendum.    Dr. Margaretann Loveless notified of the findings via Epic in-basket message at 1:56 pm on 08/11/22.  Part C reviewed in consultation with Dr. Eilleen Kempf for QA purposes.      Clinical History       Pre-op diagnosis:  IDA      Gross Description       A. Label: Gastric, rule out H. pylori.   Size: 3 pieces, 0.3-0.7 cm.   Appearance: Tan tissue.  All 1.    B. Label: Esophagus, rule out Candida.   Size: 4 pieces, 0.2-0.6 cm.   Appearance: Gray-white to pink-tan tissue.  All 1.    C. Label: Transverse colon.   Size: Multiple pieces, less than 0.1-0.5 cm.   Appearance: Pink-tan to red-brown tissue.  All 1.        Microscopic Description       Microscopic examination substantiates the above diagnosis.      EMBEDDED IMAGES     POCT Glucose   Result Value Ref Range    Glucose, POC 160 70 - 179 mg/dL    Operator ID Bedelia Person    POCT Glucose   Result Value Ref Range    Glucose, POC 110 70 - 179 mg/dL    Operator ID Arnell Asal      RADIOLOGY      08/13/21 CT chest   IMPRESSION:  1. Overall improved aeration  in the bilateral lungs, suggesting improved infectious or inflammatory pneumonitis since prior examination. Persistent linear and subpleural reticular opacities bilaterally, suggestive of residual disease.  2. Scattered micronodules bilaterally, unchanged since recent prior examinations. No new, or enlarging nodules are noted. Recommend follow-up examination in 6-12 months based on Fleischner Society criteria.  3. Mediastinal and hilar lymph nodes appear relatively unchanged to slightly improved in the interval. No new or enlarging adenopathy.    ASSESSMENT and PLAN:    # Chronic lymphocytic leukemia  --Rai stage IV CLL  --CLL FISH panel shows 13 q. deletion 21.5%, IGHV mutated -good prognostic features  --11/05/2020: TP53 mutation negative.  NOTCH1 negative.  SF3B1 positive.  SF3B1 positivity indicates poor prognosis.  -- no evidence of hemolysis.   --Indication for treatment:  advanced stage and history of thrombocytopenia  -- He has been treated with Rituxan many years ago by Dr Thedore Mins, with good response in the 1990s. Treatment indication at that time may have been frequent infections.  -- started acabrutinib 12/19/20    PLAN:  -- continue Acalbrutinib 100 mg BID.   -- Due for IVIG approximately 1-1/2 months the end of January 2023.    -- If patient is a surgical candidate would require to stop 1 week prior to surgery.    #Colon adenocarcinoma  - Test recent colonoscopy for evaluation of iron deficiency anemia revealed a transverse colon lesion consistent with adenocarcinoma --MMR deficient, MLH1 and PMS2 absent expression.  BRAF and MLH1 hyper methylation testing is pending.  -- Staging CT chest without contrast and CT abdomen and pelvis with contrast scheduled for today afternoon  -- If there is no evidence of metastatic disease will refer him to colorectal surgery for consideration of surgical resection.  -- Check CBC, ferritin, iron panel and CEA today.    # Patchy lung opacities  -- most recent CT chest from 05/16/2021 mixed response with regardin the GGOs.  He continues to have no infectious symptoms.  Patient is we would expect after his COVID diagnosis.  He followed up with Dr. Virgia Land recently.  Findings on his CT chest are felt to represent volume overload as noted by a recent elevation in proBNP on 06/02/2021.  No pulmonary intervention was recommended by Dr. Virgia Land at this time.  --follow up CT chest 08/13/21 showed overall improved aeration. Small indeterminate micronodules at [place] and per radiology require 6-12 month follow up .   -- he will continue following with Dr Ace Gins.   -- CT chest scheduled today.  Ordered CT chest without contrast to limit contrast load.          #Iron deficiency anemia  -- He was noted to have iron deficiency in 2014 and has since received IV iron in the past and has been on oral iron 1 pill/day.   -- iron deficiency anemia/ACD in 07/2019 with hb down to 8.7 and ferritin 78, iron sat 12%  -- now s/p 1020 mg ferheme 07/2019 and early 10/2018 with hb improvement to 10.9 today.   --EGD and colonoscopy with Dr. Loreta Ave 01/2020 revealed bleeding gastric ulcer, smaller antral ulcer with old blood. .  Currently being managed with sucralfate.  -- In June 2023 hemoglobin was down to 8.3.  He was given additional IV iron with Feraheme 510 mg x 2 doses  -- seen by Gi and planned for EGD , pending cardiology clearance  -- s/p feraheme 510 mg x 2 doses 9/28-10/9  --Scheduled for IV iron 08/17/2022 and 08/27/2022  -- Check  CBC, ferritin, iron panel        # Hypogammaglobulinemia   --History level continues to be low however he has not had any more infections since we started him on IVIG. Before we started him on IVIG he has had multiple hospitalizations for pneumonias.  --Therefore we will continue IVIG for now every 3 months as he is tolerated this well.           # Health maintenance:  -- Evusheld  #1 12/04/20   -- bivalent booster  06/01/21  -- influenza vaccine 06/01/21  --evusheld  #2 07/01/21          # diastolic CHF with volume overload  -- followed closely by Cardiology.recently switched to bumex.   -- diuresis has bee associated with some degree of renal dysfunction in the last 5-6 months  -- he will continue following closely with Cardiology    # CKD Stage IV  -- Following closely with [facility].  -- Cr stable      DISPO:  RTC in 4 weeks for MD visit   .     I have independently reviewed notes from prior visits, results from labs and imaging.  The current plan of care requires intensive monitoring for toxicity due to use of antineoplastic treatments.  Medical decision making was of high complexity due to to ongoing risk of morbidity/mortality from antineoplastic treatment.      Elyse Hsu, MD  [facility]

## 2022-08-17 ENCOUNTER — Ambulatory Visit: Admit: 2022-08-17 | Discharge: 2022-08-18 | Payer: MEDICARE

## 2022-08-17 MED ADMIN — diphenhydrAMINE (BENADRYL) capsule/tablet 25 mg: 25 mg | ORAL | @ 15:00:00 | Stop: 2022-08-17

## 2022-08-17 MED ADMIN — ferumoxytoL (FERAHEME) 510 mg in sodium chloride 0.9 % (NS) 100 mL IVPB-MBP: 510 mg | INTRAVENOUS | @ 16:00:00 | Stop: 2022-08-17

## 2022-08-17 MED ADMIN — acetaminophen (TYLENOL) tablet 650 mg: 650 mg | ORAL | @ 15:00:00 | Stop: 2022-08-17

## 2022-08-17 NOTE — Unmapped (Signed)
Called patient for a newly diagnosed colon cancer from a referral from Dr Jomarie Longs. I introduced myself as a navigator and explained my role in his care to include care coordination, education, and support to the patient was receptive. Plan of care discussed to include surgical consult with Dr. Elenore Rota. Patient has an appointment with Dr. Elenore Rota 12/21 [place]. Patient verbalized understanding. All questions answered,  emotional support provided, encouraged patient to call with further needs.

## 2022-08-17 NOTE — Unmapped (Signed)
Infusion completed, tolerated well, VSS.  Patient provided blankets, drinks, and snacks as needed throughout infusion.  Comfort continually assessed by staff.  PIV de-accessed per [place] protocol. Patient discharged in stable condition, ambulatory.  Return to clinic information provided.

## 2022-08-19 ENCOUNTER — Ambulatory Visit: Admit: 2022-08-19 | Discharge: 2022-08-19 | Payer: MEDICARE

## 2022-08-19 ENCOUNTER — Ambulatory Visit: Admit: 2022-08-19 | Discharge: 2022-08-19 | Payer: MEDICARE | Attending: Surgery | Primary: Surgery

## 2022-08-19 DIAGNOSIS — C184 Malignant neoplasm of transverse colon: Principal | ICD-10-CM

## 2022-08-19 LAB — CEA: CARCINOEMBRYONIC ANTIGEN: 1.6 ng/mL (ref 0.0–5.0)

## 2022-08-19 NOTE — Unmapped (Signed)
SURGERY ORDERS:  Patient name:    Date of birth:             MRN:     Surgeon:  Dr. Elenore Rota  Consent for procedure:  laparoscopic extended ileocolectomy  Indication: colon  cancer  Full Code:   Yes               Status:   inpatient   Anesthesia: GETA  Location:  [facility]  Position:  supine  Postop Visit in 3 weeks    Is this ERAS?: Y   Is preop ostomy marking required?: N    Is Cardiac or Pulmonary Clearance required?: Y. Dr. Celine Mans  If yes: _______________CAD, on plavix__ (diagnosis), ____Eliquis___________(anticoagulant, if applicable).    Preop orders:  _x___Acetaminophen tablet 975mg  once pre-op       _x___Place IV  ____Celebrex 200 mg once pre-op                          _x___NPO sips w/ medications  _x___heparin 5000 Units subq once pre-op               _____Clip area per protocol  _____Lyrica 50 mg PO once pre-op                           _____Preop skin prep  _____Movantik 25mg  PO preop                                 _x___Place SCDs    OR Antibiotic Prophylaxis:  _x__Metronidazole 500mg  IV and Rocephin 2g IV pre-op   ____Levofloxacin 500mg  IV and Gentamicin IV pre-op   ____Cefazolin (Ancef) 2 gm IVPB x 1 dose (weigh <120 kg) pre-op    Bowel prep orders:   __x__Neomycin 500mg  PO, qty#6; take 2 tabs (1000mg ) at 1pm, 2pm, and 11pm on the day of bowel prep.  __x__Flagyl 500mg  PO, qty#3; take 500mg  at 1pm, 2pm, and 11pm on the day of bowel prep.  __x__Zofran 4mg  sublingual (ODT) or PO, qty#4; take every 6 hrs as needed for nausea during bowel prep/before surgery.  __x__Suprep: instructions as per package, 0 refills: drink 1st bottle at 11am and the 2nd bottle at 6pm the day prior to surgery.   _____Clenpiq: instructions as per package, 0 refills: drink 1st bottle at 11am and the 2nd bottle at 6pm the day prior to surgery.  _____Miralax bowel prep (OTC)  _____SuTabs: instructions as per package, 0 refills.  If too expensive, pt can be given Suprep.   _____No mechanical bowel prep    Other orders:   PAT:       DOS: CASE#:     Scheduler:   This is a permanent part of the patient's medical record.  ----  Thank you!  Roma Kayser, NP  [facility]  [phone]

## 2022-08-19 NOTE — Unmapped (Signed)
Patient Name: Christian Abbott  Patient Age: 87 y.o.  Encounter Date: 08/19/2022    REFERRING PHYSICIAN:  Referring, None Per Patient  [address] [place]  [place],  [place] [address]    CONSULTING PHYSICIANS:  Patient Care Team:  Arturo Morton, MD as PCP - General (Internal Medicine)  Elyse Hsu, MD (Hematology and Oncology)  [facility] (Inactive) (Gastroenterology)  Lorenza Burton, MD as Consulting Physician (Gastroenterology)  Catalina Pizza, MD as Fellow (Gastroenterology)  Harrington Challenger, RN as Registered Nurse (Oncology Navigator)    PRIMARY CARE PROVIDER:  Arturo Morton, MD      ASSESSMENT:     1.  Transverse colon cancer  2.  History of coronary artery disease     ** h/o MI status post stenting  1998 and 2013    ** sees Dr. Celine Mans (on plavix)  3.  H/o CLL, in remission on Acalbrutinib Elyse Hsu)  4.  H/o COVID pneumonia  5.  DM, last A1c per patient  = 7.1  6.  H/o TIA 10 years ago, no sequelae  7.  CKD (cr ranges 1.5-2.5 in 2023)        PLAN: We discussed the findings on recent colonoscopy and CAT scan.  I explained that, in the setting of nonmetastatic colon cancer, the cornerstone of treatment is segmental resection.  The location of the tumor is transverse colon so to perform a proper lymphadenectomy we often need to do an extended right or an extended left.  I discussed the operation, its risks and benefits, the functional changes associated with the operation and he would like to proceed.  He has notable risk factors are weight loss, diabetes and chronic illness but none of these rise to the level of modifying the plan from performing a primary anastomosis.    I am checking a CEA today.  If it is normal then I think we can comfortably move ahead with the surgery and not get an MRI to further look at the liver.  He had a clip placed at the time of his endoscopy that precludes MRI for a month.    He will need to come off of Plavix ahead of surgery.  Asked him to reach out to Dr. Celine Mans about this and to see if anything further needs to be done about his heart heading into surgery.  He received a phone call from his pulmonologist just yesterday.  They are going to call him back and make sure there is nothing special needs to be done in that regard.      I will reach out to Dr. Jomarie Longs to get guidance on how to manage his CLL treatment perioperatively.     I have reached out to Zana to call the patient and work on scheduling.    ______________________________________      REASON FOR VISIT:   Colon cancer    HISTORY OF PRESENT ILLNESS:    Christian Abbott is a 87 y.o. male who is seen in consultation at the request of Referring, None Per Dennie Bible* for colon cancer    The patient has an assortment of medical troubles but mostly does okay.  He has CLL and has been under treatment for it.  He had a PET scan last year that was reassuring.  He is noted to have ongoing anemia so it is recommended that he have an upper and lower endoscopy which was performed mid December 2023 by Dr. Jefferey Pica.  On the  colonoscopy a mass was found in the mid transverse colon.  Was biopsied to be adenocarcinoma.  MLH1 and PMS2 were not present on mismatch repair staining.  Additional studies are underway.  Interestingly, he had a colonoscopy in 2021 that did not show anything.  His PET scan from 2022 did not show focal avidity in this area.    He feels well.  He does not have pain.  He does not have blockage.  He is eating well.  He has lost 7 or 8 pounds over the past year.  He tells me his diabetes is under pretty good control.  When he was taking steroids last year for pulmonary issues he had some troubles but lately it has been routinely in the mid 100s.  He tells me his last A1c was 7.      He remains pretty active.  He walks a mile daily.    REVIEW OF SYSTEMS:    Denies fevers, chills, sweats, nausea, vomiting, abdominal pain, chest pain, shortness of breath, congestion, cough, heart palpitations, weight loss, change in bowel habits, melena or hematochezia. Denies lower extremity edema, headaches, visual changes.  Denies dysuria, hematuria or urinary incontinence. Denies focal bony pain.  Remainder of a 10 system review of systems is negative.     ALLERGIES:  has No Known Allergies.    MEDICATIONS:  Current Outpatient Medications   Medication Sig Dispense Refill    acalabrutinib (CALQUENCE) 100 mg tablet Take 1 tablet (100 mg total) by mouth Two (2) times a day. Swallow whole with water. Do not chew, crush, dissolve, or cut tablets. 60 tablet 5    ACCU-CHEK AVIVA PLUS TEST STRP Strp       ACCU-CHEK GUIDE ME GLUCOSE MTR Misc       ACCU-CHEK SOFTCLIX LANCETS lancets       albuterol 2.5 mg /3 mL (0.083 %) Nebu 3 mL, albuterol 5 mg/mL Nebu 0.5 mL Inhale 2.5 mg every six (6) hours.      atorvastatin (LIPITOR) 80 MG tablet Take 1 tablet (80 mg total) by mouth daily.      cholecalciferol, vitamin D3, 5,000 unit Tab Take 1 tablet (125 mcg total) by mouth daily.      clopidogreL (PLAVIX) 75 mg tablet Take 1 tablet (75 mg total) by mouth daily. 30 tablet 0    doxazosin (CARDURA) 4 MG tablet Take 1 tablet (4 mg total) by mouth nightly.  1    famotidine (PEPCID) 40 MG tablet Take 1 tablet (40 mg total) by mouth two (2) times a day.      fluconazole (DIFLUCAN) 200 MG tablet Take 1 tablet (200 mg total) by mouth daily for 14 days. 14 tablet 0    folic acid (FOLVITE) 800 MCG tablet Take 0.5 tablets (400 mcg total) by mouth daily.      glipiZIDE (GLUCOTROL XL) 2.5 MG 24 hr tablet Take 1 tablet (2.5 mg total) by mouth nightly.      glipiZIDE (GLUCOTROL XL) 5 MG 24 hr tablet Take 1 tablet (5 mg total) by mouth daily.      glucosamine-chondroitin (OSTEO BI-FLEX) 250-200 mg Tab Take by mouth daily.      hydrALAZINE (APRESOLINE) 50 MG tablet Take 1 tablet (50 mg total) by mouth Three (3) times a day.      ipratropium-albuterol (DUO-NEB) 0.5-2.5 mg/3 mL nebulizer GIVE 3 ML TWICE DAILY INHALATION 90 DAYS  3 JARDIANCE 25 mg tablet Take 10 mg by mouth daily. Pt reports he is now  taking 10mg       liraglutide (VICTOZA 2-PAK) 0.6 mg/0.1 mL (18 mg/3 mL) injection Inject 0.3 mL (1.8 mg total) under the skin nightly.      metoprolol succinate (TOPROL-XL) 50 MG 24 hr tablet Take 0.5 tablets (25 mg total) by mouth daily. Pt reports he is taking 25mg  daily      mometasone-formoterol (DULERA) 200-5 mcg/actuation HFAA Inhale 2 puffs two (2) times a day.      potassium chloride 20 MEQ ER tablet Take 1 tablet (20 mEq total) by mouth Take as directed.      sod picosulf-mag ox-citric ac (CLENPIQ) 10 mg-3.5 gram- 12 gram/160 mL Soln Take as directed by GI 320 mL 0    sucralfate (CARAFATE) 100 mg/mL suspension TAKE 10 ML BY MOUTH TWO (2) TIMES A DAY. (Patient taking differently: Take 10 mL (1 g total) by mouth nightly.) 420 mL 0    vit C/E/Zn/coppr/lutein/zeaxan (PRESERVISION AREDS 2 ORAL) Take by mouth two (2) times a day.      zafirlukast (ACCOLATE) 20 MG tablet Take 1 tablet (20 mg total) by mouth two (2) times a day.      bumetanide (BUMEX) 1 MG tablet Take 1 tablet (1 mg total) by mouth daily.       No current facility-administered medications for this visit.       MEDICAL HISTORY:  Past Medical History:   Diagnosis Date    Anemia     Asthma     Cancer (CMS-HCC)     CHF (congestive heart failure) (CMS-HCC)     CLL (chronic lymphocytic leukemia) (CMS-HCC)     Diabetes mellitus (CMS-HCC)     Generalized weakness     Hypertension     Macular degeneration     Stomach ulcer     Stroke (CMS-HCC) 2008    Wheelchair dependent        SURGICAL HISTORY:  Past Surgical History:   Procedure Laterality Date    CATARACT EXTRACTION, BILATERAL      CERVICAL SPINE SURGERY  2012    COLONOSCOPY  2021    CORONARY ANGIOPLASTY WITH STENT PLACEMENT      ESOPHAGOGASTRODUODENOSCOPY      INGUINAL HERNIA REPAIR      PR UPPER GI ENDOSCOPY,BIOPSY N/A 08/10/2022    Procedure: ESOPHAGOGASTRODUODENOSCOPY WITH COLD FORCEP BIOPSIES AND RESOLUTION CLIP APPLICATION; Surgeon: Celesta Aver, MD;  Location: ENDO PROCEDURES [place];  Service: Gastroenterology    PR UPPER GI ENDOSCOPY,W/DIR SUBMUC INJ N/A 08/10/2022    Procedure: COLONOSCOPY WITH COLD FORCEPS BIOPSIES AND SPOT INK INJECTION;  Surgeon: Celesta Aver, MD;  Location: ENDO PROCEDURES [place];  Service: Gastroenterology       SOCIAL HISTORY:   Tobacco use:  reports that he has never smoked. He has never used smokeless tobacco.  Alcohol use:  reports no history of alcohol use.  Drug use:  reports no history of drug use.    FAMILY HISTORY:  family history is not on file.        Objective :    Vital Signs for this encounter:  BSA: 1.71 meters squared  BP 142/60  - Pulse 70  - Ht 160 cm (5' 3)  - Wt 65.8 kg (145 lb)  - BMI 25.69 kg/m??     General Appearance:  No acute distress, well appearing and well nourished.   HEENT:  Normocephalic, atraumatic, anicteric sclera   Abdomen:    Heart:  Lungs: Soft, nontender nondistended  Rate within normal  limits  Non-labored breathing   Musculoskeletal: Extremities without clubbing, cyanosis, or edema.   Skin: Skin color, texture, turgor normal, no rashes or lesions.   Neurologic: No motor abnormalities noted.  Sensation grossly intact.   Rectal: Not examined         DIAGNOSTIC STUDIES:     1.  Colonoscopy August 10, 2022 [facility]): Mid transverse colon mass biopsy to be adenocarcinoma.  MLH1 and PMS2 deficient.  Further studies pending    2.  Upper endoscopy performed August 10, 2022 [facility]): Moderate inflammation and hemorrhage was seen within the stomach.  This was biopsied.  He also had some plaques within his esophagus.  This was biopsied and thought to be candidal.  He was prescribed Diflucan which he has not taken yet.    3.  CT scan of the chest abdomen pelvis performed August 13, 2022 reviewed by me: No evidence for definitive metastatic disease.  Thickening can be seen in the mid transverse colon consistent with the known mass.  There were a few small hepatic lesions most likely benign cyst.    4.  CEA today is pending

## 2022-08-25 NOTE — Unmapped (Signed)
Pt's daughter, Lissa Hoard called to confirm we had gotten her dad's cardiac clearance. This RN checked, and found it scanned in under media. Called Sonia back to confirm.

## 2022-08-26 DIAGNOSIS — C184 Malignant neoplasm of transverse colon: Principal | ICD-10-CM

## 2022-08-26 MED ORDER — SUCRALFATE 100 MG/ML ORAL SUSPENSION
Freq: Every evening | ORAL | 3 refills | 42 days | Status: CP
Start: 2022-08-26 — End: ?

## 2022-08-26 MED ORDER — ONDANSETRON HCL 4 MG TABLET
ORAL_TABLET | 0 refills | 0 days | Status: CP
Start: 2022-08-26 — End: ?

## 2022-08-26 MED ORDER — SODIUM,POTASSIUM,MAG SULFATES 17.5 GRAM-3.13 GRAM-1.6 GRAM ORAL SOLN
0 refills | 0 days | Status: CP
Start: 2022-08-26 — End: ?

## 2022-08-26 MED ORDER — METRONIDAZOLE 500 MG TABLET
ORAL_TABLET | 0 refills | 0 days | Status: CP
Start: 2022-08-26 — End: ?

## 2022-08-26 MED ORDER — NEOMYCIN 500 MG TABLET
ORAL_TABLET | 0 refills | 0 days | Status: CP
Start: 2022-08-26 — End: ?

## 2022-08-27 ENCOUNTER — Ambulatory Visit: Admit: 2022-08-27 | Discharge: 2022-08-28 | Payer: MEDICARE

## 2022-08-27 DIAGNOSIS — C9110 Chronic lymphocytic leukemia of B-cell type not having achieved remission: Principal | ICD-10-CM

## 2022-08-27 DIAGNOSIS — D801 Nonfamilial hypogammaglobulinemia: Principal | ICD-10-CM

## 2022-08-27 DIAGNOSIS — D509 Iron deficiency anemia, unspecified: Principal | ICD-10-CM

## 2022-08-27 MED ADMIN — diphenhydrAMINE (BENADRYL) capsule/tablet 25 mg: 25 mg | ORAL | @ 15:00:00 | Stop: 2022-08-27

## 2022-08-27 MED ADMIN — ferumoxytoL (FERAHEME) 510 mg in sodium chloride 0.9 % (NS) 100 mL IVPB-MBP: 510 mg | INTRAVENOUS | @ 15:00:00 | Stop: 2022-08-27

## 2022-08-27 MED ADMIN — acetaminophen (TYLENOL) tablet 650 mg: 650 mg | ORAL | @ 15:00:00 | Stop: 2022-08-27

## 2022-08-27 NOTE — Unmapped (Signed)
Infusion completed, tolerated well, VSS.  Patient provided blankets, drinks, and snacks as needed throughout infusion.  Comfort continually assessed by staff.  PIV de-accessed per [place] protocol. Patient discharged in stable condition, ambulatory.  Return to clinic information provided.

## 2022-08-30 NOTE — Unmapped (Signed)
[facility] Refill Coordination Note    Specialty Medication(s) to be Shipped:   Hematology/Oncology: Calquence 100mg     Other medication(s) to be shipped: No additional medications requested for fill at this time     Christian Abbott, DOB: 11-14-1934  Phone: [phone] (home)       All above HIPAA information was verified with patient's daughter     Was a Nurse, learning disability used for this call? No    Completed refill call assessment today to schedule patient's medication shipment from the [facility] [phone]).  All relevant notes have been reviewed.     Specialty medication(s) and dose(s) confirmed: Regimen is correct and unchanged.   Changes to medications: Barrie reports no changes at this time.  Changes to insurance: No  New side effects reported not previously addressed with a pharmacist or physician: None reported  Questions for the pharmacist: No    Confirmed patient received a Conservation officer, historic buildings and a Surveyor, mining with first shipment. The patient will receive a drug information handout for each medication shipped and additional FDA Medication Guides as required.       DISEASE/MEDICATION-SPECIFIC INFORMATION        N/A    SPECIALTY MEDICATION ADHERENCE     Medication Adherence    Patient reported X missed doses in the last month: 0  Specialty Medication: CALQUENCE 100 mg tablet (acalabrutinib)  Patient is on additional specialty medications: No  Informant: child/children                          Were doses missed due to medication being on hold? No    Calquence 100 mg: 12 days of medicine on hand       REFERRAL TO PHARMACIST     Referral to the pharmacist: Not needed      [facility]     Shipping address confirmed in Epic.     Delivery Scheduled: Yes, Expected medication delivery date: 09/03/22.     Medication will be delivered via Next Day Courier to the prescription address in Epic WAM.    [name]   [facility] Specialty Technician

## 2022-09-02 DIAGNOSIS — C9110 Chronic lymphocytic leukemia of B-cell type not having achieved remission: Principal | ICD-10-CM

## 2022-09-02 MED FILL — CALQUENCE (ACALABRUTINIB MALEATE) 100 MG TABLET: ORAL | 30 days supply | Qty: 60 | Fill #1

## 2022-09-03 ENCOUNTER — Ambulatory Visit: Admit: 2022-09-03 | Discharge: 2022-09-03 | Payer: MEDICARE

## 2022-09-03 ENCOUNTER — Encounter: Admit: 2022-09-03 | Discharge: 2022-09-03 | Payer: MEDICARE | Attending: Primary Care | Primary: Primary Care

## 2022-09-06 MED ORDER — SUCRALFATE 100 MG/ML ORAL SUSPENSION
Freq: Every evening | ORAL | 3 refills | 42 days
Start: 2022-09-06 — End: ?

## 2022-09-06 NOTE — Unmapped (Signed)
Patient's daughter, Lissa Hoard, called to say that she canceled patient's scheduled lab/exam appts for 09/09/22 since patient is having surgery on 1/12 and will be fasting. She is concerned that he will be too weak to come in since he has lost weight recently.  Lissa Hoard had questions about how patient's CLL will be managed while patient is in hospital for surgery. CM recommended that patient keep appt with provider so that their questions can be addressed. Sonia asked that provider be consulted to see if lab/exam appts are still necessary.    Secure chat msg sent to provider asking if appts scheduled for 1/11 are necessary.    Provider called and spoke with Lissa Hoard letting her know that it is OK to cancel; patient will need follow up on or after 2/2/224. Front desk aware.

## 2022-09-06 NOTE — Unmapped (Signed)
Called patient and infomred him and daughter okay to cancel follo wup appointment on 1/11 and surgery is scheduled for 1/12.  He will stop calquence today to avoid medication for 3-4 days prior to surgery to avoid risk of bleeding and infections. Okay to resume calquence 7 days post op  Follow up appointment with me in first week of feb after surgical post op visit

## 2022-09-07 MED ORDER — SUCRALFATE 100 MG/ML ORAL SUSPENSION
Freq: Every evening | ORAL | 3 refills | 42 days
Start: 2022-09-07 — End: ?

## 2022-09-07 NOTE — Unmapped (Addendum)
The patient was admitted to the floor after a Laparoscopic extended right ileocolectomy with ileo to distal transverse colon anastomosis with Dr. Elenore Rota  on 09/10/22 for Transverse Colon Cancer. Postop pain was controlled with oral pain meds. Diet was gradually advanced which was tolerated well. Bowel function returned, and they were able to void***.   Pt was therefore discharged to *** in stable condition on ***/***/24.  I personally spent *** minutes in discharge planning services including the coordination of care and patient education.      Follow up plan(s):  - on ***/***/24 with Dr. Elenore Rota, Colorectal Surgery    Pathology from surgery:  ***    ---    Active problems present on admission:  -Transverse Colon Cancer: surg management as above, await final path.   -CAD, hx MI status post stenting 1998 and 2013: will hold Plavix (Dr. Celine Mans)   -Hx TIA 10 years ago, no sequelae   -HTN: cont home Hydralazine, metoprolol succinate  w/ parameters, monitor VS.  -CLL, in remission, on Acalbrutinib Elyse Hsu)   -CKD (cr ranges 1.5-2.5 in 2023): no nephrotoxic medications   -HLD: cont home atorvastatin  -GERD: home Pepcid, Carafate    -DMT2: hold home meds, SSI postop w/ achs BGs  -Anemia of chronic dz: preop Hgb 9.7, will monitor postop and transfuse if <7 (<8 if cardiac hx) or symptoms  -BPH: cont home Cardura   -Asthma: cont home ipratropium-albuterol, mometasone-formoterol, zafirlukast   -CHF: cont home bumetanide   -Wheelchair dependent

## 2022-09-10 ENCOUNTER — Encounter
Admit: 2022-09-10 | Discharge: 2022-09-14 | Disposition: A | Discharge status: Home / Self Care | Payer: MEDICARE | Attending: Family | Admitting: Surgery

## 2022-09-10 ENCOUNTER — Ambulatory Visit
Admit: 2022-09-10 | Discharge: 2022-09-14 | Disposition: A | Discharge status: Home / Self Care | Payer: MEDICARE | Admitting: Surgery

## 2022-09-10 ENCOUNTER — Encounter
Admit: 2022-09-10 | Discharge: 2022-09-14 | Payer: MEDICARE | Attending: Student in an Organized Health Care Education/Training Program

## 2022-09-10 ENCOUNTER — Encounter: Admit: 2022-09-10 | Discharge: 2022-09-14 | Payer: MEDICARE

## 2022-09-10 MED ADMIN — bupivacaine-EPINEPHrine (PF) (MARCAINE-PF w/EPI) 0.25 %-1:200,000 injection (PF): @ 23:00:00 | Stop: 2022-09-10

## 2022-09-10 MED ADMIN — ROCuronium (ZEMURON) injection: INTRAVENOUS | Stop: 2022-09-10

## 2022-09-10 MED ADMIN — fentaNYL (PF) (SUBLIMAZE) injection: INTRAVENOUS | Stop: 2022-09-10

## 2022-09-10 MED ADMIN — fentaNYL (PF) (SUBLIMAZE) injection: INTRAVENOUS | @ 23:00:00 | Stop: 2022-09-10

## 2022-09-10 MED ADMIN — fentaNYL (PF) (SUBLIMAZE) injection: INTRAVENOUS | @ 22:00:00 | Stop: 2022-09-10

## 2022-09-10 MED ADMIN — sterile water irrigation solution: @ 23:00:00 | Stop: 2022-09-10

## 2022-09-10 MED ADMIN — diphenhydrAMINE (BENADRYL) injection: INTRAVENOUS | @ 22:00:00 | Stop: 2022-09-10

## 2022-09-10 MED ADMIN — glycopyrrolate (ROBINUL) injection: INTRAVENOUS | @ 22:00:00 | Stop: 2022-09-10

## 2022-09-10 MED ADMIN — heparin, porcine (PF) injection 5,000 Units: 5000 [IU] | SUBCUTANEOUS | @ 17:00:00 | Stop: 2022-09-10

## 2022-09-10 MED ADMIN — ipratropium-albuterol (DUO-NEB) 0.5-2.5 mg/3 mL nebulizer solution 3 mL: 3 mL | RESPIRATORY_TRACT | @ 18:00:00 | Stop: 2022-09-10

## 2022-09-10 MED ADMIN — Propofol (DIPRIVAN) injection: INTRAVENOUS | @ 22:00:00 | Stop: 2022-09-10

## 2022-09-10 MED ADMIN — dexmedeTOMIDine (Precedex) (4 mcg/mL) infusion: INTRAVENOUS | @ 23:00:00 | Stop: 2022-09-10

## 2022-09-10 MED ADMIN — succinylcholine chloride (ANECTINE) injection: INTRAVENOUS | @ 22:00:00 | Stop: 2022-09-10

## 2022-09-10 MED ADMIN — acetaminophen (TYLENOL) tablet 975 mg: 975 mg | ORAL | @ 17:00:00 | Stop: 2022-09-10

## 2022-09-10 MED ADMIN — lactated Ringers infusion: 40 mL/h | INTRAVENOUS | @ 16:00:00

## 2022-09-10 MED ADMIN — metroNIDAZOLE (FLAGYL) IVPB 500 mg: 500 mg | INTRAVENOUS | @ 22:00:00 | Stop: 2022-09-10

## 2022-09-10 MED ADMIN — lidocaine (PF) (XYLOCAINE-MPF) 10 mg/mL (1 %) injection 10 mL: 10 mL | @ 18:00:00 | Stop: 2022-09-10

## 2022-09-10 MED ADMIN — dexmedeTOMIDine (Precedex) (4 mcg/mL) infusion: INTRAVENOUS | Stop: 2022-09-10

## 2022-09-10 MED ADMIN — ROCuronium (ZEMURON) injection: INTRAVENOUS | @ 23:00:00 | Stop: 2022-09-10

## 2022-09-10 MED ADMIN — ROCuronium (ZEMURON) injection: INTRAVENOUS | @ 22:00:00 | Stop: 2022-09-10

## 2022-09-10 MED ADMIN — cefTRIAXone (ROCEPHIN) 2 g in sodium chloride 0.9 % (NS) 100 mL IVPB-connector bag: 2 g | INTRAVENOUS | @ 22:00:00 | Stop: 2022-09-10

## 2022-09-10 MED ADMIN — fentaNYL (PF) (SUBLIMAZE) injection 50 mcg: 50 ug | INTRAVENOUS | @ 18:00:00 | Stop: 2022-09-10

## 2022-09-10 MED ADMIN — potassium chloride 20 mEq in 100 mL IVPB Premix: INTRAVENOUS | @ 22:00:00 | Stop: 2022-09-10

## 2022-09-10 MED ADMIN — phenylephrine 0.8 mg/10 mL (80 mcg/mL) injection: INTRAVENOUS | @ 23:00:00 | Stop: 2022-09-10

## 2022-09-10 NOTE — Unmapped (Signed)
Pt moved to RR31 for art line placement prior to surgery. Family remains at side.

## 2022-09-11 LAB — BASIC METABOLIC PANEL
ANION GAP: 10 mmol/L (ref 3–11)
BLOOD UREA NITROGEN: 35 mg/dL — ABNORMAL HIGH (ref 9–23)
BUN / CREAT RATIO: 21
CALCIUM: 8.6 mg/dL — ABNORMAL LOW (ref 8.7–10.4)
CHLORIDE: 104 mmol/L (ref 98–107)
CO2: 25 mmol/L (ref 20.0–31.0)
CREATININE: 1.67 mg/dL — ABNORMAL HIGH (ref 0.73–1.18)
EGFR CKD-EPI (2021) MALE: 40 mL/min/{1.73_m2} — ABNORMAL LOW (ref >=60)
GLUCOSE RANDOM: 237 mg/dL — ABNORMAL HIGH (ref 70–179)
POTASSIUM: 3.4 mmol/L — ABNORMAL LOW (ref 3.5–5.1)
SODIUM: 139 mmol/L (ref 136–145)

## 2022-09-11 LAB — CBC
HEMATOCRIT: 27.8 % — ABNORMAL LOW (ref 39.0–48.0)
HEMOGLOBIN: 8.7 g/dL — ABNORMAL LOW (ref 12.9–16.5)
MEAN CORPUSCULAR HEMOGLOBIN CONC: 31.2 g/dL — ABNORMAL LOW (ref 32.0–36.0)
MEAN CORPUSCULAR HEMOGLOBIN: 24.9 pg — ABNORMAL LOW (ref 25.9–32.4)
MEAN CORPUSCULAR VOLUME: 79.7 fL (ref 77.6–95.7)
MEAN PLATELET VOLUME: 9.3 fL (ref 6.8–10.7)
PLATELET COUNT: 76 10*9/L — ABNORMAL LOW (ref 150–450)
RED BLOOD CELL COUNT: 3.48 10*12/L — ABNORMAL LOW (ref 4.26–5.60)
RED CELL DISTRIBUTION WIDTH: 20 % — ABNORMAL HIGH (ref 12.2–15.2)
WBC ADJUSTED: 3.1 10*9/L — ABNORMAL LOW (ref 3.6–11.2)

## 2022-09-11 LAB — MAGNESIUM: MAGNESIUM: 2.1 mg/dL (ref 1.6–2.6)

## 2022-09-11 MED ADMIN — hydrALAZINE (APRESOLINE) injection 5 mg: 5 mg | INTRAVENOUS | @ 02:00:00 | Stop: 2022-09-10

## 2022-09-11 MED ADMIN — oxyCODONE (ROXICODONE) immediate release tablet 10 mg: 10 mg | ORAL | @ 10:00:00 | Stop: 2022-09-24

## 2022-09-11 MED ADMIN — fentaNYL (PF) (SUBLIMAZE) injection 25 mcg: 25 ug | INTRAVENOUS | @ 01:00:00 | Stop: 2022-09-10

## 2022-09-11 MED ADMIN — lactated Ringers irrigation solution: Stop: 2022-09-10

## 2022-09-11 MED ADMIN — docusate sodium (COLACE) capsule 100 mg: 100 mg | ORAL | @ 13:00:00

## 2022-09-11 MED ADMIN — acetaminophen (TYLENOL) tablet 650 mg: 650 mg | ORAL | @ 23:00:00

## 2022-09-11 MED ADMIN — albuterol 2.5 mg /3 mL (0.083 %) nebulizer solution 2.5 mg: 2.5 mg | RESPIRATORY_TRACT | @ 08:00:00

## 2022-09-11 MED ADMIN — bumetanide (BUMEX) tablet 1 mg: 1 mg | ORAL | @ 13:00:00

## 2022-09-11 MED ADMIN — calcium chloride 100 mg/mL (10 %) injection: INTRAVENOUS | Stop: 2022-09-10

## 2022-09-11 MED ADMIN — insulin lispro (HumaLOG) injection 0-20 Units: 0-20 [IU] | SUBCUTANEOUS | @ 13:00:00

## 2022-09-11 MED ADMIN — albuterol 2.5 mg /3 mL (0.083 %) nebulizer solution 2.5 mg: 2.5 mg | RESPIRATORY_TRACT | @ 20:00:00

## 2022-09-11 MED ADMIN — hydrALAZINE (APRESOLINE) tablet 50 mg: 50 mg | ORAL | @ 20:00:00

## 2022-09-11 MED ADMIN — insulin lispro (HumaLOG) injection 0-20 Units: 0-20 [IU] | SUBCUTANEOUS | @ 22:00:00

## 2022-09-11 MED ADMIN — docusate sodium (COLACE) capsule 100 mg: 100 mg | ORAL | @ 05:00:00

## 2022-09-11 MED ADMIN — lactated Ringers infusion: 50 mL/h | INTRAVENOUS | @ 20:00:00

## 2022-09-11 MED ADMIN — lactated Ringers infusion: 50 mL/h | INTRAVENOUS | @ 05:00:00

## 2022-09-11 MED ADMIN — insulin lispro (HumaLOG) injection 0-20 Units: 0-20 [IU] | SUBCUTANEOUS | @ 17:00:00

## 2022-09-11 MED ADMIN — HYDROmorphone (PF) (DILAUDID) injection 0.2 mg: .2 mg | INTRAVENOUS | @ 02:00:00 | Stop: 2022-09-10

## 2022-09-11 MED ADMIN — terazosin (HYTRIN) capsule 5 mg: 5 mg | ORAL | @ 05:00:00

## 2022-09-11 MED ADMIN — acetaminophen (TYLENOL) tablet 650 mg: 650 mg | ORAL | @ 10:00:00

## 2022-09-11 MED ADMIN — pantoprazole (Protonix) EC tablet 40 mg: 40 mg | ORAL | @ 13:00:00

## 2022-09-11 MED ADMIN — potassium chloride ER tablet 20 mEq: 20 meq | ORAL | @ 17:00:00 | Stop: 2022-09-11

## 2022-09-11 MED ADMIN — acetaminophen (OFIRMEV) 10 mg/mL injection: INTRAVENOUS | Stop: 2022-09-10

## 2022-09-11 MED ADMIN — ondansetron (ZOFRAN) injection: INTRAVENOUS | Stop: 2022-09-10

## 2022-09-11 MED ADMIN — hydrALAZINE (APRESOLINE) tablet 50 mg: 50 mg | ORAL | @ 05:00:00

## 2022-09-11 MED ADMIN — montelukast (SINGULAIR) tablet 10 mg: 10 mg | ORAL | @ 05:00:00

## 2022-09-11 MED ADMIN — fluticasone furoate-vilanterol (BREO ELLIPTA) 200-25 mcg/dose inhaler 1 puff: 1 | RESPIRATORY_TRACT | @ 13:00:00

## 2022-09-11 MED ADMIN — acetaminophen (TYLENOL) tablet 650 mg: 650 mg | ORAL | @ 05:00:00

## 2022-09-11 MED ADMIN — potassium chloride ER tablet 20 mEq: 20 meq | ORAL | @ 13:00:00 | Stop: 2022-09-11

## 2022-09-11 MED ADMIN — sugammadex (BRIDION) injection: INTRAVENOUS | Stop: 2022-09-10

## 2022-09-11 MED ADMIN — acetaminophen (TYLENOL) tablet 650 mg: 650 mg | ORAL | @ 17:00:00

## 2022-09-11 MED ADMIN — albuterol 2.5 mg /3 mL (0.083 %) nebulizer solution 2.5 mg: 2.5 mg | RESPIRATORY_TRACT | @ 13:00:00

## 2022-09-11 MED ADMIN — albuterol (PROVENTIL HFA;VENTOLIN HFA) 90 mcg/actuation inhaler: RESPIRATORY_TRACT | Stop: 2022-09-10

## 2022-09-11 MED ADMIN — insulin lispro (HumaLOG) injection 0-20 Units: 0-20 [IU] | SUBCUTANEOUS | @ 05:00:00

## 2022-09-11 MED ADMIN — potassium chloride ER tablet 20 mEq: 20 meq | ORAL | @ 20:00:00 | Stop: 2022-09-11

## 2022-09-11 NOTE — Unmapped (Signed)
PHYSICAL THERAPY  Evaluation (09/11/22 1245)          Patient Name:  Christian Abbott       Medical Record Number: [id number]   Date of Birth: 02-10-1935  Sex: Male        Treatment Diagnosis: Decreased endurance and decreased functional mobility s/p ileocolectomy     Activity Tolerance: Limited by fatigue     ASSESSMENT  Problem List: Pain, Impaired balance, Decreased endurance, Decreased mobility, Gait deviation, Fall risk      Assessment : Pt is an 88 yo [place] male who presents s/p R ileocolectomy. Pt with PMH of CAD, hx of TIA, HTN, CKD, HLD, GERD, DM II, anemia, BPH, asthma, CHF. Pt reports being modified independent at baseline with use of rollator for mobility, has family assist as needed. Pt presents with decreased endurance, increased assistance need for mobility, to minimize fall risk. Pt will benefit from skilled therapy to address deficits and maximize functional mobility. Recommend continued therapy 3x/week upon discharge to maximize functional independence.      Today's Interventions: Chart reviewed, eval completed, mobility assessed, discussed PT POC, pt left supine in bed                        PLAN  Planned Frequency of Treatment:  1x per day for: 3-4x week       Planned Interventions: Balance activities, Education - Patient, Education - Family / caregiver, Endurance activities, Functional mobility, Investment banker, operational, Museum/gallery curator, Therapeutic exercise, Therapeutic activity, Acupuncturist Physical Therapy Recommendations:  PT Post Acute Discharge Recommendations: Skilled PT services indicated, 3x weekly      PT DME Recommendations: None            Goals:   Patient and Family Goals: Pt wants to get stronger and be able to go home              SHORT GOAL #1: Pt will perform bed mobility supine<>sit mod I               Time Frame : 2 weeks  SHORT GOAL #2: Pt will perform all transfers with LRAD and mod I              Time Frame : 2 weeks  SHORT GOAL #3: Pt will ambulate with LRAD and SBA for greater than 250'              Time Frame : 2 weeks  SHORT GOAL #4: Pt will ascend/descend stairs per home set up with LRAD and SBA               Time Frame : 2 weeks                         Prognosis:  Good     Barriers to Discharge: Endurance deficits, Impaired Balance, Gait instability     SUBJECTIVE  Patient reports: Pt states he is still tired but will see what he can do, family at bedside, agreeable to treatment        Prior Functional Status: Pt reports being mod I with use of rollator for mobility, mod I with ADLs  Equipment available at home: Rolling walker, Rollator      Past Medical History:   Diagnosis Date    Anemia     Asthma     Cancer (CMS-HCC)     CHF (congestive heart  failure) (CMS-HCC)     Chronic kidney disease      High numbers    CLL (chronic lymphocytic leukemia) (CMS-HCC)     Diabetes mellitus (CMS-HCC)     Generalized weakness     GERD (gastroesophageal reflux disease)     History of transfusion     Appox 2012 [facility]    Hypertension     Hypokalemia     Macular degeneration     Stomach ulcer     Stroke (CMS-HCC) 2008    Wears glasses     Wheelchair dependent             Social History     Tobacco Use    Smoking status: Never    Smokeless tobacco: Never   Substance Use Topics    Alcohol use: No       Past Surgical History:   Procedure Laterality Date    CATARACT EXTRACTION, BILATERAL      CERVICAL SPINE SURGERY  2012    fusion    COLONOSCOPY  2021    CORONARY ANGIOPLASTY WITH STENT PLACEMENT  2014    ESOPHAGOGASTRODUODENOSCOPY      INGUINAL HERNIA REPAIR      PR UPPER GI ENDOSCOPY,BIOPSY N/A 08/10/2022    Procedure: ESOPHAGOGASTRODUODENOSCOPY WITH COLD FORCEP BIOPSIES AND RESOLUTION CLIP APPLICATION;  Surgeon: Celesta Aver, MD;  Location: ENDO [facility]URES [place];  Service: Gastroenterology    PR UPPER GI ENDOSCOPY,W/DIR SUBMUC INJ N/A 08/10/2022    Procedure: COLONOSCOPY WITH COLD FORCEPS BIOPSIES AND SPOT INK INJECTION;  Surgeon: Celesta Aver, MD;  Location: ENDO PROCEDURES [place];  Service: Gastroenterology             History reviewed. No pertinent family history.     Allergies: Patient has no known allergies.                  Objective Findings  Precautions / Restrictions  Precautions: Falls precautions  Weight Bearing Status: Non-applicable  Required Braces or Orthoses: Non-applicable                Pain Comments: Pt notes moderate pain to abdomen with movement, nursing aware  Medical Tests / Procedures: 1/12: Laparoscopic extended right ileocolectomy with ileo to distal transverse colon anastomosis  Equipment / Environment: Vascular access (PIV, TLC, Port-a-cath, PICC)     Vitals/Orthostatics : Sitting BP 134/49 Standing BP 141/45     Living Situation  Living Environment: House  Lives With: Family, Daughter (Currently residing with his daughter)  Home Living: One level home, Stairs to enter with rails, Walk-in shower, Shower chair with back, Bedside commode  Rail placement (outside): Bilateral rails  Number of Stairs to Enter (outside): 3      Cognition: WFL  Orientation: Oriented x4  Visual/Perception: Wears Glasses/Contacts     Skin Inspection: Dressing C/D/I     Upper Extremities  UE ROM: Right WFL, Left WFL  UE Strength: Right WFL, Left WFL  UE comment: B UE grossly intact    Lower Extremities  LE ROM: Right WFL, Left WFL  LE Strength: Left WFL, Right WFL  LE comment: B LE grossly intact     Sensation: WFL    Static Sitting-Balance Support: No upper extremity supported, Feet supported  Static Sitting-Level of Assistance: Close supervision  Dynamic Sitting-Balance Support: No upper extremity supported, Feet supported  Dynamic Sitting-Level of Assistance: Close supervision  Sitting Balance comments: Fair + sitting balance    Static Standing-Balance Support: Bilateral  upper extremity supported  Static Standing-Level of Assistance: Contact guard  Dynamic Standing-Balance Support: Bilateral upper extremity supported  Dynamic Standing - Level of Assistance: Contact guard  Standing Balance comments: Pt with fair standing balance      Bed Mobility: Supine to Sit  Supine to Sit assistance level: Minimal assist, patient does 75% or more  Bed Mobility: Supine<>sit min A for trunk control, LE mobility to return to supine     Transfers: Sit to Stand  Sit to Stand assistance level: Minimal assist, patient does 75% or more  Transfer comments: Sit<>stand with RW and min A, cues for hand placement and sequencing      Gait Level of Assistance: Contact guard assist, steadying assist  Gait Assistive Device: Front wheel walker  Gait Distance Ambulated (ft): 75 ft  Gait: Pt amb with RW and CGA, step through gait with decreased cadence and stride length, occasional cueing for obstacle avoidance                  Endurance: Pt with mild endurance deficits with limited activity     AM-PAC-6 click    Difficulty turning over In bed?: None - Modified Independent/Independent  Difficulty sitting down/standing up from chair with arms? : A Little - Minimal/Contact Guard Assist/Supervision  Difficulty moving from supine to sitting on edge of bed?: A Little - Minimal/Contact Guard Assist/Supervision  Help moving to and from bed from wheelchair?: A Little - Minimal/Contact Guard Assist/Supervision  Help currently needed walking in a hospital room?: A Little - Minimal/Contact Guard Assist/Supervision  Help currently needed climbing 3-5 steps with railing?: A Little - Minimal/Contact Guard Assist/Supervision    Basic Mobility Score:  Basic Mobility Score 6 click: 19    6 click  Score (in points): % of Functional Impairment, Limitation, Restriction  6: 100% impaired, limited, restricted  7-8: At least 80%, but less than 100% impaired, limited restricted  9-13: At least 60%, but less than 80% impaired, limited restricted  14-19: At least 40%, but less than 60% impaired, limited restricted  20-22: At least 20%, but less than 40% impaired, limited restricted  23: At least 1%, but less than 20% impaired, limited restricted  24: 0% impaired, limited restricted     Physical Therapy Session Duration  PT Individual [mins]: 25 (Eval 10 min, Gait 15 min)     Medical Staff Made Aware: RN, OT     The care for this patient was completed by Shanon Brow, PT:  A student was present and participated in the care. Licensed/Credentialed therapist was physically present and immediately available to direct and supervise tasks that were related to patient management. The direction and supervision was continuous throughout the time these tasks were performed.    Nolon Bussing Mihira Tozzi, PT   I attest that I have reviewed the above information.  Signed: Nolon Bussing Chantele Corado, PT  Filed 09/11/2022

## 2022-09-11 NOTE — Unmapped (Signed)
COLORECTAL SURGERY PROGRESS NOTES    Assessment/Plan:   POD#1 s/p lap extended right colectomy for mid-transverse colon cancer (DOS: 09/10/22 by Dr. Elenore Rota):    Postop status:   - abd benign, AROBF and walk today. Discussed path will not be back until Tue/Wed likely. PT/OT ordered.   - OOB freq, ambulate at least tid; DVT proph w/ subcutaneous hep & scds  - Pathology: pending  - EDD: 2-4 days pending bowel fxn  ________________________________________________________________________________________  ________________________________________________________________________________________    Active Problems upon Admission:  -Transverse Colon Cancer: surg management as above, await final path.   -CAD, hx MI status post stenting 1998 and 2013: will hold Plavix (Dr. Celine Mans)   -Hx TIA 10 years ago, no sequelae   -HTN: cont home Hydralazine, metoprolol succinate  w/ parameters, monitor VS.  -CLL, in remission, on Acalbrutinib Elyse Hsu)   -CKD (cr ranges 1.5-2.5 in 2023): no nephrotoxic medications   -HLD: cont home atorvastatin  -GERD: home Pepcid, Carafate    -DMT2: hold home meds, SSI postop w/ achs BGs  -Anemia of chronic dz: preop Hgb 9.7, will monitor postop and transfuse if <7 (<8 if cardiac hx) or symptoms  -BPH: cont home Cardura   -Asthma: cont home ipratropium-albuterol, mometasone-formoterol, zafirlukast   -CHF: cont home bumetanide   -Wheelchair dependent     Subjective:   No events overnight, pain controlled. No n/v. No f/c. -flatus/-bm.   Overall feels ok and daughter is here.    Objective:     Vital signs in last 24 hours:  Temp:  [36.1 ??C (97 ??F)-36.8 ??C (98.2 ??F)] 36.7 ??C (98.1 ??F)  Pulse:  [51-69] 61  SpO2 Pulse:  [54-60] 60  Resp:  [12-26] 18  BP: (112-183)/(33-74) 136/52  MAP (mmHg):  [62-94] 62  A BP-2: (163-168)/(31-49) 163/31  MAP:  [76 mmHg-94 mmHg] 76 mmHg  SpO2:  [93 %-100 %] 100 %  BMI (Calculated):  [22.6] 22.6    Intake/Output last 24 hours:  I/O last 3 completed shifts:  In: 2333.3 [I.V.:1983.3; IV Piggyback:350]  Out: 1090 [Urine:1040; Blood:50]    Physical Exam:    NAD, RRR  Abd soft, nt, nd, incs c/d/i with dermabond    Data Review:    All lab results last 24 hours:    Recent Results (from the past 24 hour(s))   ABO/RH    Collection Time: 09/10/22  9:27 AM   Result Value Ref Range    Reject/Recollect REJECT    POCT Glucose    Collection Time: 09/10/22  9:59 AM   Result Value Ref Range    Glucose, POC 159 70 - 179 mg/dL    Operator ID Gaspar Cola    Resp Care Only Blood Gas Arterial    Collection Time: 09/10/22  5:03 PM   Result Value Ref Range    pH, Arterial 7.43 7.35 - 7.45    pCO2, Arterial 37.0 35.0 - 45.0 mm[Hg]    pO2, Arterial 264 (H) 83 - 108 mm[Hg]    O2 Sat, Arterial 98.5 92.0 - 100.0 %    Base Excess, Arterial 0.0 -2.4 - 2.3 mmol/L    HCO3 (Bicarbonate), Arterial 24.5 21.0 - 28.0 mmol/L   GLUCOSE-BG/POC    Collection Time: 09/10/22  5:03 PM   Result Value Ref Range    Glucose Whole Blood 111 70 - 179 mg/dL   HEMOGLOBIN BG/POC    Collection Time: 09/10/22  5:03 PM   Result Value Ref Range    Hemoglobin 9.0 (L) 13.5 -  17.5 g/dL   SODIUM-BG/POC    Collection Time: 09/10/22  5:03 PM   Result Value Ref Range    Sodium Whole Blood 142 135 - 145 mmol/L   POTASSIUM-BG/POC    Collection Time: 09/10/22  5:03 PM   Result Value Ref Range    Potassium, Bld 2.5 (LL) 3.5 - 5.1 mmol/L   POCT Chloride    Collection Time: 09/10/22  5:03 PM   Result Value Ref Range    Chloride 100 98 - 107 mmol/L   POCT Ionized Calcium    Collection Time: 09/10/22  5:03 PM   Result Value Ref Range    Ionized Calcium 1.11 (L) 1.14 - 1.29 mmol/L    Ionized Calcium     Resp Care Only Blood Gas Arterial    Collection Time: 09/10/22  6:06 PM   Result Value Ref Range    pH, Arterial 7.39 7.35 - 7.45    pCO2, Arterial 40.6 35.0 - 45.0 mm[Hg]    pO2, Arterial 156 (H) 83 - 108 mm[Hg]    O2 Sat, Arterial 98.0 92.0 - 100.0 %    Base Excess, Arterial -0.3 -2.4 - 2.3 mmol/L    HCO3 (Bicarbonate), Arterial 24.2 21.0 - 28.0 mmol/L   GLUCOSE-BG/POC    Collection Time: 09/10/22  6:06 PM   Result Value Ref Range    Glucose Whole Blood 122 70 - 179 mg/dL   HEMOGLOBIN BG/POC    Collection Time: 09/10/22  6:06 PM   Result Value Ref Range    Hemoglobin 8.8 (L) 13.5 - 17.5 g/dL   SODIUM-BG/POC    Collection Time: 09/10/22  6:06 PM   Result Value Ref Range    Sodium Whole Blood 139 135 - 145 mmol/L   POTASSIUM-BG/POC    Collection Time: 09/10/22  6:06 PM   Result Value Ref Range    Potassium, Bld 3.8 3.5 - 5.1 mmol/L   POCT Chloride    Collection Time: 09/10/22  6:06 PM   Result Value Ref Range    Chloride 102 98 - 107 mmol/L   POCT Ionized Calcium    Collection Time: 09/10/22  6:06 PM   Result Value Ref Range    Ionized Calcium 1.12 (L) 1.14 - 1.29 mmol/L    Ionized Calcium     POCT Glucose    Collection Time: 09/10/22  7:56 PM   Result Value Ref Range    Glucose, POC 160 70 - 179 mg/dL    Operator ID Harvel Ricks    POCT Glucose    Collection Time: 09/10/22 11:26 PM   Result Value Ref Range    Glucose, POC 179 70 - 179 mg/dL    Operator ID Chavez-Funes, Ivolina x    Basic Metabolic Panel    Collection Time: 09/11/22  5:06 AM   Result Value Ref Range    Sodium 139 136 - 145 mmol/L    Potassium 3.4 (L) 3.5 - 5.1 mmol/L    Chloride 104 98 - 107 mmol/L    CO2 25.0 20.0 - 31.0 mmol/L    Anion Gap 10 3 - 11 mmol/L    BUN 35 (H) 9 - 23 mg/dL    Creatinine 1.61 (H) 0.73 - 1.18 mg/dL    BUN/Creatinine Ratio 21     eGFR CKD-EPI (2021) Male 40 (L) >=60 mL/min/1.93m2    Glucose 237 (H) 70 - 179 mg/dL    Calcium 8.6 (L) 8.7 - 10.4 mg/dL   CBC    Collection Time:  09/11/22  5:06 AM   Result Value Ref Range    WBC 3.1 (L) 3.6 - 11.2 10*9/L    RBC 3.48 (L) 4.26 - 5.60 10*12/L    HGB 8.7 (L) 12.9 - 16.5 g/dL    HCT 16.1 (L) 09.6 - 48.0 %    MCV 79.7 77.6 - 95.7 fL    MCH 24.9 (L) 25.9 - 32.4 pg    MCHC 31.2 (L) 32.0 - 36.0 g/dL    RDW 04.5 (H) 40.9 - 15.2 %    MPV 9.3 6.8 - 10.7 fL    Platelet 76 (L) 150 - 450 10*9/L   Magnesium Level    Collection Time: 09/11/22  5:06 AM   Result Value Ref Range    Magnesium 2.1 1.6 - 2.6 mg/dL     I have reviewed the labs and studies from the last 24 hours.

## 2022-09-11 NOTE — Unmapped (Signed)
Operative Note    Date of Service: 09/10/2022    Preoperative Diagnosis: transverse colon cancer    Postoperative Diagnosis: same    Procedure Performed:   1. Laparoscopic extended right ileocolectomy with ileo to distal transverse colon anastomosis      Surgeon: ZOXWR    Assistant Surgeon: Marchelle Gearing, PA    * She assisted due to the complexity of the case. She participated in the extended right colectomy    Anesthesia: General Endotracheal    Specimens:  right and transverse colon    Estimated Blood Loss: 50mL    Drains: None    Complications: None    Indications for Surgery: transverse colon cancer    Operative Findings: mid transverse colon mass    Procedure Details:     The patient was identified in the preoperative holding area and brought to the operating room by anesthesia and the nursing staff.  An initial timeout was performed.  The patient was then placed on the operating room table in the supine position.  Anesthesia was administered successfully.  The patient was placed in supine position. The patient was then clipped, prepped and draped in the usual sterile fashion.  A second timeout was performed with all members of the surgical and anesthesia teams to confirm patient identification, procedure, laterality and perioperative antibiotics and anticoagulation administration as indicated.    An incision was made at the umbilicus and a Veress needle was used to establish pneumoperitoneum.  Under direct vision I then placed ports.  Inspected the abdominal cavity.  There was no metastatic disease.  There was visible and palpable mass flanked by ink in the mid transverse colon.  I tented the ileocolic pedicle and scored beneath and developed a medial lateral plane separating the mesentery from the retroperitoneum down the right pelvic inlet, out to the right abdominal sidewall, over the hepatic flexure.  I divided the ileocolic pedicle high with LigaSure.  I then reflected the proximal transverse colon mesentery off the head of the pancreas to reveal the right colic which were also taken with LigaSure.  I then took the omentum off of the mid transverse colon towards the hepatic flexure.  I entered the lesser sac.  I then continued the same plane out towards the splenic flexure.  In doing so I fully exposed the transverse mesentery.  I then took down the hepatic flexure attachments and then opened up the line of Toldt attachments laterally and the superior to inferior fashion.  Next, I reflected the small bowel in the upper abdomen and scored the base of small bowel mesentery and freed it from the retroperitoneum communicating with my previous dissection plane.  I then divided the ileal mesentery to the terminal ileum and divided it with an Endo GIA stapler.    Next, I moved my ports over to the patient's right side so I got a good view of the transverse mesentery.  I used a LigaSure to come through it high along the base of the pancreas.  I continued until I was down to the distal transverse colon.  I then inspected all my dissections planes and confirmed that all was hemostatic.  Then, a locking grasper was then placed on the oriented ileum and the specimen.  A tap block was performed using quarter percent Marcaine with epinephrine x 30 mL applied bilaterally.    I then made a 5 cm incision in the upper abdomen and placed a wound protector.  I delivered the oriented ileum and  tagged with a suture.  I delivered the specimen.  There is nice vascular demarcation having taken the blood supply to the colon.  I divided the colon with a GIA 80 black stapler where it was nicely perfused.  I then aligned these 2 small bowel limb side-by-side and formed a functional end-to-end anastomosis using a GIA 80 purple load as a common channel.  The defect was closed with another firing the same stapler.  It came together well.  Suture was placed at the crotch anastomosis to support it.  It was returned to abdominal cavity.  We then closed the fascia with running #1 PDS suture.  Wounds were irrigated and skin was closed with 4-0 Monocryl and sealed with skin glue    Tumor Location:  Transverse colon Given the anatomic tumor location, was the recommended vascular ligation performed?: Yes           Surgeon Attestation: I was present for all key components of the procedure.

## 2022-09-12 LAB — BASIC METABOLIC PANEL
ANION GAP: 7 mmol/L (ref 3–11)
BLOOD UREA NITROGEN: 30 mg/dL — ABNORMAL HIGH (ref 9–23)
BUN / CREAT RATIO: 19
CALCIUM: 8.2 mg/dL — ABNORMAL LOW (ref 8.7–10.4)
CHLORIDE: 103 mmol/L (ref 98–107)
CO2: 27 mmol/L (ref 20.0–31.0)
CREATININE: 1.62 mg/dL — ABNORMAL HIGH (ref 0.73–1.18)
EGFR CKD-EPI (2021) MALE: 41 mL/min/{1.73_m2} — ABNORMAL LOW (ref >=60)
GLUCOSE RANDOM: 173 mg/dL (ref 70–179)
POTASSIUM: 3.5 mmol/L (ref 3.5–5.1)
SODIUM: 137 mmol/L (ref 136–145)

## 2022-09-12 LAB — CBC
HEMATOCRIT: 24.3 % — ABNORMAL LOW (ref 39.0–48.0)
HEMOGLOBIN: 7.7 g/dL — ABNORMAL LOW (ref 12.9–16.5)
MEAN CORPUSCULAR HEMOGLOBIN CONC: 31.9 g/dL — ABNORMAL LOW (ref 32.0–36.0)
MEAN CORPUSCULAR HEMOGLOBIN: 25.2 pg — ABNORMAL LOW (ref 25.9–32.4)
MEAN CORPUSCULAR VOLUME: 79 fL (ref 77.6–95.7)
MEAN PLATELET VOLUME: 9.3 fL (ref 6.8–10.7)
PLATELET COUNT: 79 10*9/L — ABNORMAL LOW (ref 150–450)
RED BLOOD CELL COUNT: 3.07 10*12/L — ABNORMAL LOW (ref 4.26–5.60)
RED CELL DISTRIBUTION WIDTH: 19.7 % — ABNORMAL HIGH (ref 12.2–15.2)
WBC ADJUSTED: 2.6 10*9/L — ABNORMAL LOW (ref 3.6–11.2)

## 2022-09-12 MED ORDER — DOCUSATE SODIUM 100 MG CAPSULE
ORAL_CAPSULE | Freq: Two times a day (BID) | ORAL | 0 refills | 30 days
Start: 2022-09-12 — End: 2022-10-12

## 2022-09-12 MED ORDER — ONDANSETRON 4 MG DISINTEGRATING TABLET
ORAL_TABLET | Freq: Four times a day (QID) | ORAL | 0 refills | 3 days | PRN
Start: 2022-09-12 — End: 2022-09-19

## 2022-09-12 MED ORDER — OXYCODONE 10 MG TABLET
ORAL_TABLET | Freq: Four times a day (QID) | ORAL | 0 refills | 3 days | PRN
Start: 2022-09-12 — End: 2022-09-17

## 2022-09-12 MED ADMIN — terazosin (HYTRIN) capsule 5 mg: 5 mg | ORAL | @ 02:00:00

## 2022-09-12 MED ADMIN — metoPROLOL succinate (Toprol-XL) 24 hr tablet 25 mg: 25 mg | ORAL | @ 13:00:00

## 2022-09-12 MED ADMIN — fluticasone furoate-vilanterol (BREO ELLIPTA) 200-25 mcg/dose inhaler 1 puff: 1 | RESPIRATORY_TRACT | @ 13:00:00

## 2022-09-12 MED ADMIN — montelukast (SINGULAIR) tablet 10 mg: 10 mg | ORAL | @ 02:00:00

## 2022-09-12 MED ADMIN — pantoprazole (Protonix) EC tablet 40 mg: 40 mg | ORAL | @ 13:00:00

## 2022-09-12 MED ADMIN — atorvastatin (LIPITOR) tablet 80 mg: 80 mg | ORAL | @ 02:00:00

## 2022-09-12 MED ADMIN — insulin lispro (HumaLOG) injection 0-20 Units: 0-20 [IU] | SUBCUTANEOUS | @ 18:00:00

## 2022-09-12 MED ADMIN — acetaminophen (TYLENOL) tablet 650 mg: 650 mg | ORAL | @ 05:00:00

## 2022-09-12 MED ADMIN — hydrALAZINE (APRESOLINE) tablet 50 mg: 50 mg | ORAL | @ 13:00:00

## 2022-09-12 MED ADMIN — heparin, porcine (PF) injection 5,000 Units: 5000 [IU] | SUBCUTANEOUS | @ 02:00:00

## 2022-09-12 MED ADMIN — acetaminophen (TYLENOL) tablet 650 mg: 650 mg | ORAL | @ 18:00:00

## 2022-09-12 MED ADMIN — acetaminophen (TYLENOL) tablet 650 mg: 650 mg | ORAL | @ 11:00:00

## 2022-09-12 MED ADMIN — hydrALAZINE (APRESOLINE) tablet 50 mg: 50 mg | ORAL | @ 02:00:00

## 2022-09-12 MED ADMIN — albuterol 2.5 mg /3 mL (0.083 %) nebulizer solution 2.5 mg: 2.5 mg | RESPIRATORY_TRACT | @ 03:00:00

## 2022-09-12 MED ADMIN — docusate sodium (COLACE) capsule 100 mg: 100 mg | ORAL | @ 13:00:00

## 2022-09-12 MED ADMIN — acetaminophen (TYLENOL) tablet 650 mg: 650 mg | ORAL | @ 23:00:00

## 2022-09-12 MED ADMIN — insulin lispro (HumaLOG) injection 0-20 Units: 0-20 [IU] | SUBCUTANEOUS | @ 22:00:00

## 2022-09-12 MED ADMIN — albuterol 2.5 mg /3 mL (0.083 %) nebulizer solution 2.5 mg: 2.5 mg | RESPIRATORY_TRACT | @ 20:00:00

## 2022-09-12 MED ADMIN — heparin, porcine (PF) injection 5,000 Units: 5000 [IU] | SUBCUTANEOUS | @ 19:00:00

## 2022-09-12 MED ADMIN — bumetanide (BUMEX) tablet 1 mg: 1 mg | ORAL | @ 13:00:00

## 2022-09-12 MED ADMIN — docusate sodium (COLACE) capsule 100 mg: 100 mg | ORAL | @ 02:00:00

## 2022-09-12 MED ADMIN — albuterol 2.5 mg /3 mL (0.083 %) nebulizer solution 2.5 mg: 2.5 mg | RESPIRATORY_TRACT | @ 13:00:00

## 2022-09-12 MED ADMIN — oxyCODONE (ROXICODONE) immediate release tablet 5 mg: 5 mg | ORAL | @ 02:00:00 | Stop: 2022-09-24

## 2022-09-12 MED ADMIN — heparin, porcine (PF) injection 5,000 Units: 5000 [IU] | SUBCUTANEOUS | @ 11:00:00

## 2022-09-12 MED ADMIN — insulin lispro (HumaLOG) injection 0-20 Units: 0-20 [IU] | SUBCUTANEOUS | @ 02:00:00

## 2022-09-12 NOTE — Unmapped (Signed)
COLORECTAL SURGERY PROGRESS NOTES    Assessment/Plan:   POD#2 s/p lap extended right colectomy for mid-transverse colon cancer (DOS: 09/10/22 by Dr. Elenore Rota):    Postop status:   - abd benign, AROBF and walk more today. PT/OT recs are HHPT/OT. VSS, labs stable.   - OOB freq, ambulate at least tid; DVT proph w/ subcutaneous hep & scds  - Pathology: pending  - EDD: 2-3 days pending bowel fxn  ________________________________________________________________________________________  ________________________________________________________________________________________    Active Problems upon Admission:  -Transverse Colon Cancer: surg management as above, await final path.   -CAD, hx MI status post stenting 1998 and 2013: will hold Plavix (Dr. Celine Mans)   -Hx TIA 10 years ago, no sequelae   -HTN: cont home Hydralazine, metoprolol succinate  w/ parameters, monitor VS.  -CLL, in remission, on Acalbrutinib Elyse Hsu)   -CKD (cr ranges 1.5-2.5 in 2023): no nephrotoxic medications   -HLD: cont home atorvastatin  -GERD: home Pepcid, Carafate    -DMT2: hold home meds, SSI postop w/ achs BGs  -Anemia of chronic dz: preop Hgb 9.7, will monitor postop and transfuse if <7 (<8 if cardiac hx) or symptoms  -BPH: cont home Cardura   -Asthma: cont home ipratropium-albuterol, mometasone-formoterol, zafirlukast   -CHF: cont home bumetanide   -Wheelchair dependent     Subjective:   No events overnight. No n/v. -flatus/-bm. Feels gas rumbling in belly.  Using IS.   Had low grade temp 100 less than 24 hrs postop (3pm on Sat), and likely just atelectasis.   Overall feels ok and says no pain, and daughter is here.  On 1/13 & 1/14, we discussed path will not be back until Tue/Wed likely    Objective:     Vital signs in last 24 hours:  Temp:  [36.2 ??C (97.2 ??F)-38 ??C (100.4 ??F)] 36.7 ??C (98.1 ??F)  Pulse:  [67-86] 70  Resp:  [16-20] 16  BP: (122-151)/(39-50) 122/40  MAP (mmHg):  [64-76] 64  SpO2:  [95 %-99 %] 98 %    Intake/Output last 24 hours:  I/O last 3 completed shifts:  In: 2523.3 [P.O.:240; I.V.:2183.3; IV Piggyback:100]  Out: 1615 [Urine:1590; Blood:25]    Physical Exam:    NAD, RRR  Abd soft, nt, nd, incs c/d/i with dermabond    Data Review:    All lab results last 24 hours:    Recent Results (from the past 24 hour(s))   POCT Glucose    Collection Time: 09/11/22  7:57 AM   Result Value Ref Range    Glucose, POC 191 (H) 70 - 179 mg/dL    Operator ID Ar[place]ta Creek, Lissethe    POCT Glucose    Collection Time: 09/11/22 11:36 AM   Result Value Ref Range    Glucose, POC 268 (H) 70 - 179 mg/dL    Operator ID [place], Lissethe    POCT Glucose    Collection Time: 09/11/22  4:27 PM   Result Value Ref Range    Glucose, POC 299 (H) 70 - 179 mg/dL    Operator ID Darlyne Russian    POCT Glucose    Collection Time: 09/11/22  8:42 PM   Result Value Ref Range    Glucose, POC 294 (H) 70 - 179 mg/dL    Operator ID Allen-Shabaze, Melody    Basic Metabolic Panel    Collection Time: 09/12/22  4:27 AM   Result Value Ref Range    Sodium 137 136 - 145 mmol/L    Potassium 3.5 3.5 -  5.1 mmol/L    Chloride 103 98 - 107 mmol/L    CO2 27.0 20.0 - 31.0 mmol/L    Anion Gap 7 3 - 11 mmol/L    BUN 30 (H) 9 - 23 mg/dL    Creatinine 1.61 (H) 0.73 - 1.18 mg/dL    BUN/Creatinine Ratio 19     eGFR CKD-EPI (2021) Male 41 (L) >=60 mL/min/1.20m2    Glucose 173 70 - 179 mg/dL    Calcium 8.2 (L) 8.7 - 10.4 mg/dL   CBC    Collection Time: 09/12/22  4:27 AM   Result Value Ref Range    WBC 2.6 (L) 3.6 - 11.2 10*9/L    RBC 3.07 (L) 4.26 - 5.60 10*12/L    HGB 7.7 (L) 12.9 - 16.5 g/dL    HCT 09.6 (L) 04.5 - 48.0 %    MCV 79.0 77.6 - 95.7 fL    MCH 25.2 (L) 25.9 - 32.4 pg    MCHC 31.9 (L) 32.0 - 36.0 g/dL    RDW 40.9 (H) 81.1 - 15.2 %    MPV 9.3 6.8 - 10.7 fL    Platelet 79 (L) 150 - 450 10*9/L     I have reviewed the labs and studies from the last 24 hours.

## 2022-09-12 NOTE — Unmapped (Signed)
OCCUPATIONAL THERAPY  Evaluation (09/11/22 1623)    Patient Name:  Christian Abbott       Medical Record Number: [id number]   Date of Birth: 11-20-1934  Sex: Male            OT Treatment Diagnosis:  Impaired BADLs    Assessment  88 y/o M s/p  lap extended right colectomy for mid-transverse colon cancer. Pt presents this date w/ generalized weakness, abdominal pain, impaired balance, and limited endurance which impacts performance in BADLs, IADLs, and functional mobility. At baseline, pt is ind w/ BADLs, has assist w/ IADLs, and ind-Mod I w/ functional mobility. Pt currently requires Min A-Mod A w/ LB ADLs, Max A for IADLs, and CGA-Min A for functional mobility w/ RW.  Based on the [facility] raw score of 18/24, pt is considered 47% impaired in self-care at this time. Pt is presenting significantly below baseline and would benefit from skilled acute OT services to address above-stated deficits.  Following d/c, OT recommends therapy 3x/week in order to promote return to PLOF.    Today's Interventions:   RN cleared pt for therapy. Pt received supine in bed w/ family present, agreeable to OT . OT provided education on role/purpose of OT and POC. Pt verbalized understanding of education.    Skilled intervention and education provided on the following:    Abdominal precautions:   - educated on abdominal precautions for comfort (avoid bending, lifting, twisting) and functional implications.  - educated patient on log roll technique and sequencing.  Patient return demo'd Min A for log roll.     Transfers:   - educated on safe use of RW during ADLs  - hand positioning during sit<>stand transfers  Patient return demo'd Min A from EOB.    Lower body dressing modifications:   - educated on seated modifications to reduce falls.  - modified tailor position to reduce anterior weight shift/reduce pain.  - engaged patient in dynamic balance preparatory tasks for clothing management over hips.  Patient return demo'd CGA for dynamic balance tasks, CGA for doffing socks, total A donning.      Toileting/toilet transfers:   - engaged patient in standing urination w/ use of urinal (reports he uses this at home as well). Patient return demo'd Min A to position urinal.      DME recommendations:   - educated patient on RW to reduce risk of falls, improve balance, and increase safety during ADLs.    Activity pacing and energy conservation:   - educated patient and family on energy conservation strategies including activity pacing, self-monitoring of energy levels, delegation, and balancing rest w/ movement.     Endurance:   - in order to build endurance, OT engaged patient in ambulating household distances w/ RW and CGA. Patient able to participate x6 minutes.      Following treatment, pt left supine in bed, bed alarm on, all needs within reach.        Activity Tolerance During Today's Session  Tolerated treatment well    Plan  Planned Frequency of Treatment:  1-2x per day for: 3-4x week       Planned Interventions:  ADL retraining, Adaptive equipment, Education - Family / caregiver, Education - Patient, Bed mobility, Balance activities, Endurance activities, Functional mobility, Home exercise program, Safety education, Therapist provided opportunity for spontaneous movement, Therapeutic exercise, UE Strength / coordination exercise, Transfer training    Post-Discharge Occupational Therapy Recommendations:   3x weekly   OT DME Recommendations: None -  GOALS:   Patient and Family Goals: To return to PLOF.            Short Term:  SHORT GOAL #1: Patient will perform toilet transfer/toileting Mod I.   Time Frame : 2 weeks  SHORT GOAL #2: Patient will perform LB dressing Mod I.   Time Frame : 2 weeks  SHORT GOAL #3: Patient will perform simulated shower transfer Mod I.   Time Frame : 2 weeks  SHORT GOAL #4: Patient will perform standing ADL x10 minutes w/ supervision.   Time Frame : 2 weeks            Prognosis:  Good  Positive Indicators:  Agreeable to therapy, PLOF, motivation, family support  Barriers to Discharge: Endurance deficits, Impaired Balance, Gait instability, Inability to safely perform ADLS    Subjective  Prior Functional Status PTA, pt ind w/ mobility w/ intermittent use of SPC. For longer distances, he uses rollator. Typically ambulates ~1 mile a day in the house w/ rollator.  Ind w/ BADLs. Daughter assists w/ IADLs and med management. Patient and daughter live together.  Patient has a PhD in Patent attorney. Will stay at son's house at discharge. Home set up reflects this.    Medical Tests / Procedures: Laparoscopic extended right ileocolectomy with ileo to distal transverse colon anastomosis       Patient / Caregiver reports: Patient agreeable to therapy. Son and daughter present and supportive.    Past Medical History:   Diagnosis Date    Anemia     Asthma     Cancer (CMS-HCC)     CHF (congestive heart failure) (CMS-HCC)     Chronic kidney disease      High numbers    CLL (chronic lymphocytic leukemia) (CMS-HCC)     Diabetes mellitus (CMS-HCC)     Generalized weakness     GERD (gastroesophageal reflux disease)     History of transfusion     Appox 2012 [facility]    Hypertension     Hypokalemia     Macular degeneration     Stomach ulcer     Stroke (CMS-HCC) 2008    Wears glasses     Wheelchair dependent     Social History     Tobacco Use    Smoking status: Never    Smokeless tobacco: Never   Substance Use Topics    Alcohol use: No      Past Surgical History:   Procedure Laterality Date    CATARACT EXTRACTION, BILATERAL      CERVICAL SPINE SURGERY  2012    fusion    COLONOSCOPY  2021    CORONARY ANGIOPLASTY WITH STENT PLACEMENT  2014    ESOPHAGOGASTRODUODENOSCOPY      INGUINAL HERNIA REPAIR      PR UPPER GI ENDOSCOPY,BIOPSY N/A 08/10/2022    Procedure: ESOPHAGOGASTRODUODENOSCOPY WITH COLD FORCEP BIOPSIES AND RESOLUTION CLIP APPLICATION;  Surgeon: Celesta Aver, MD;  Location: ENDO PROCEDURES [place];  Service: Gastroenterology    PR UPPER GI ENDOSCOPY,W/DIR SUBMUC INJ N/A 08/10/2022    Procedure: COLONOSCOPY WITH COLD FORCEPS BIOPSIES AND SPOT INK INJECTION;  Surgeon: Celesta Aver, MD;  Location: ENDO PROCEDURES [place];  Service: Gastroenterology    History reviewed. No pertinent family history.     Patient has no known allergies.     Objective Findings  Precautions / Restrictions  Falls precautions       Weight Bearing  Non-applicable    Required Braces or  Orthoses  Non-applicable    Communication Preference  Verbal    Pain  Patient denied pain.    Equipment / Environment  Vascular access (PIV, TLC, Port-a-cath, PICC)    Living Situation  Living Environment: House  Lives With: Family, Daughter  Home Living: One level home, Stairs to enter with rails, Walk-in shower, Shower chair with back, Bedside commode, Standard height toilet  Rail placement (outside): Bilateral rails  Number of Stairs to [employer] (outside): 3  Equipment available at home: [employer], Rollator, Arts development officer, Paediatric nurse with back, [employer]     Cognition   Orientation Level:  Oriented x 4   Arousal/Alertness:  Appropriate responses to stimuli   Attention Span:  Appears intact   Memory:  Appears intact   Following Commands:  Follows all commands and directions without difficulty   Safety Judgment:  Good awareness of safety precautions   Awareness of Errors:  Good awareness of errors made   Problem Solving:  Able to problem solve independently   Comments:      Vision / Hearing   Vision: No acute deficits identified     Hearing: No deficit identified         Hand Function:  Right Hand Function: Right hand grip strength, ROM and coordination WNL  Left Hand Function: Left hand grip strength, ROM and coordination WNL    Skin Inspection:  Skin Inspection: Intact where visualized    ROM / Strength:  UE ROM/Strength: Left WFL, Right WFL  LE ROM/Strength: Left WFL, Right WFL    Coordination:  Coordination: WFL      Balance:  Static Sitting-Balance Support: Feet supported, No upper extremity supported  Static Sitting-Level of Assistance: Close supervision  Dynamic Sitting-Balance Support: Feet supported, No upper extremity supported  Dynamic Sitting-Balance: Reaching for objects  Dynamic Sitting-Level of Assistance: Close supervision    Static Standing-Balance Support: Bilateral upper extremity supported  Static Standing-Level of Assistance: Contact guard  Dynamic Standing-Balance Support: Bilateral upper extremity supported  Dynamic Standing-Balance: Reaching for objects  Dynamic Standing - Level of Assistance: Engineer, production Assistance Needed: Yes  Transfers - Needs Assistance: Min assist  Bed Mobility Assistance Needed: Yes  Bed Mobility - Needs Assistance: Min assist  Ambulation: CGA w/ RW    ADLs  ADLs: Needs assistance with ADLs  ADLs - Needs Assistance: Bathing, Toileting, LB dressing  Bathing - Needs Assistance: Mod assist  Toileting - Needs Assistance: Min assist  LB Dressing - Needs Assistance: Mod assist  IADLs: Max A         Medical Staff Made Aware: RN Zella Ball, PT Jill Alexanders    Occupational Therapy Session Duration  OT Individual [mins]: 34         I attest that I have reviewed the above information.  Signed: Archer Asa, OT  Filed 09/11/2022      AM-PAC-Daily Activity  Lower Body Dressing assistance needs: A lot - Maximum/Moderate Assistance  Bathing assistance needs: A lot - Maximum/Moderate Assistance  Toileting assistance needs: A lot - Maximum/Moderate Assistance  Upper Body Dressing assistance needs: None - Modified Independent/Independent  Personal Grooming assistance needs: None - Modified Independent/Independent  Eating Meals assistance needs: None - Modified Independent/Independent    Daily Activity Score:  Daily Activity Score: 18    Score (in points): % of Functional Impairment, Limitation, Restriction  6: 100% impaired, limited, restricted  7-8: At least 80%, but less than 100% impaired, limited restricted  9-13: At least  60%, but less than 80% impaired, limited restricted  14-19: At least 40%, but less than 60% impaired, limited restricted  20-22: At least 20%, but less than 40% impaired, limited restricted  23: At least 1%, but less than 20% impaired, limited restricted  24: 0% impaired, limited restricted

## 2022-09-12 NOTE — Unmapped (Signed)
Sepsis screening negative.    Patient had one episode of fever yesterday afternoon but was afebrile during the night. Plan of care ongoing.    Problem: Wound  Goal: Optimal Coping  Outcome: Ongoing - Unchanged  Goal: Optimal Functional Ability  Outcome: Ongoing - Unchanged  Goal: Absence of Infection Signs and Symptoms  Outcome: Ongoing - Unchanged  Goal: Improved Oral Intake  Outcome: Ongoing - Unchanged  Goal: Optimal Pain Control and Function  Outcome: Ongoing - Unchanged  Goal: Skin Health and Integrity  Outcome: Ongoing - Unchanged  Intervention: Optimize Skin Protection  Recent Flowsheet Documentation  Taken 09/12/2022 0105 by Rollene Fare, RN  Pressure Reduction Techniques:   frequent weight shift encouraged   heels elevated off bed  Pressure Reduction Devices: pressure-redistributing mattress utilized  Taken 09/11/2022 2305 by Rollene Fare, RN  Pressure Reduction Techniques:   frequent weight shift encouraged   heels elevated off bed  Pressure Reduction Devices: pressure-redistributing mattress utilized  Taken 09/11/2022 2105 by Rollene Fare, RN  Pressure Reduction Techniques:   frequent weight shift encouraged   heels elevated off bed  Pressure Reduction Devices: pressure-redistributing mattress utilized  Taken 09/11/2022 1905 by Rollene Fare, RN  Pressure Reduction Techniques:   frequent weight shift encouraged   heels elevated off bed  Pressure Reduction Devices: pressure-redistributing mattress utilized  Skin Protection: adhesive use limited  Goal: Optimal Wound Healing  Outcome: Ongoing - Unchanged     Problem: Adult Inpatient Plan of Care  Goal: Plan of Care Review  Outcome: Ongoing - Unchanged  Goal: Patient-Specific Goal (Individualized)  Outcome: Ongoing - Unchanged  Goal: Absence of Hospital-Acquired Illness or Injury  Outcome: Ongoing - Unchanged  Intervention: Identify and Manage Fall Risk  Recent Flowsheet Documentation  Taken 09/11/2022 1905 by Rollene Fare, RN  Safety Interventions: lighting adjusted for tasks/safety   low bed   family at bedside   bed alarm  Intervention: Prevent Skin Injury  Recent Flowsheet Documentation  Taken 09/12/2022 0105 by Rollene Fare, RN  Positioning for Skin: Left  Taken 09/11/2022 2305 by Rollene Fare, RN  Positioning for Skin: Supine/Back  Taken 09/11/2022 2105 by Rollene Fare, RN  Positioning for Skin: Supine/Back  Taken 09/11/2022 1905 by Rollene Fare, RN  Positioning for Skin: Supine/Back  Skin Protection: adhesive use limited  Intervention: Prevent and Manage VTE (Venous Thromboembolism) Risk  Recent Flowsheet Documentation  Taken 09/11/2022 1905 by Rollene Fare, RN  VTE Prevention/Management: ambulation promoted  Anti-Embolism Device Type: SCD, Knee  Anti-Embolism Intervention: On  Anti-Embolism Device Location: BLE  Goal: Optimal Comfort and Wellbeing  Outcome: Ongoing - Unchanged  Goal: Readiness for Transition of Care  Outcome: Ongoing - Unchanged  Goal: Rounds/Family Conference  Outcome: Ongoing - Unchanged  Flowsheets (Taken 09/12/2022 0140)  Participants: nursing     Problem: Fall Injury Risk  Goal: Absence of Fall and Fall-Related Injury  Outcome: Ongoing - Unchanged  Intervention: Identify and Manage Contributors  Recent Flowsheet Documentation  Taken 09/11/2022 1905 by Rollene Fare, RN  Self-Care Promotion: independence encouraged  Intervention: Promote Injury-Free Environment  Recent Flowsheet Documentation  Taken 09/11/2022 1905 by Rollene Fare, RN  Safety Interventions:   lighting adjusted for tasks/safety   low bed   family at bedside   bed alarm     Problem: Comorbidity Management  Goal: Maintenance of Asthma Control  Outcome: Ongoing - Unchanged  Goal: Blood Glucose Levels Within Targeted Range  Outcome: Ongoing - Unchanged  Goal: Maintenance of Heart Failure Symptom Control  Outcome: Ongoing -  Unchanged  Goal: Blood Pressure in Desired Range  Outcome: Ongoing - Unchanged

## 2022-09-13 LAB — BASIC METABOLIC PANEL
ANION GAP: 5 mmol/L (ref 3–11)
BLOOD UREA NITROGEN: 23 mg/dL (ref 9–23)
BUN / CREAT RATIO: 16
CALCIUM: 8.2 mg/dL — ABNORMAL LOW (ref 8.7–10.4)
CHLORIDE: 104 mmol/L (ref 98–107)
CO2: 27 mmol/L (ref 20.0–31.0)
CREATININE: 1.4 mg/dL — ABNORMAL HIGH (ref 0.73–1.18)
EGFR CKD-EPI (2021) MALE: 49 mL/min/{1.73_m2} — ABNORMAL LOW (ref >=60)
GLUCOSE RANDOM: 209 mg/dL — ABNORMAL HIGH (ref 70–179)
POTASSIUM: 3.9 mmol/L (ref 3.5–5.1)
SODIUM: 136 mmol/L (ref 136–145)

## 2022-09-13 LAB — CBC
HEMATOCRIT: 25.9 % — ABNORMAL LOW (ref 39.0–48.0)
HEMOGLOBIN: 8.2 g/dL — ABNORMAL LOW (ref 12.9–16.5)
MEAN CORPUSCULAR HEMOGLOBIN CONC: 31.5 g/dL — ABNORMAL LOW (ref 32.0–36.0)
MEAN CORPUSCULAR HEMOGLOBIN: 25.1 pg — ABNORMAL LOW (ref 25.9–32.4)
MEAN CORPUSCULAR VOLUME: 79.8 fL (ref 77.6–95.7)
MEAN PLATELET VOLUME: 9.1 fL (ref 6.8–10.7)
PLATELET COUNT: 99 10*9/L — ABNORMAL LOW (ref 150–450)
RED BLOOD CELL COUNT: 3.25 10*12/L — ABNORMAL LOW (ref 4.26–5.60)
RED CELL DISTRIBUTION WIDTH: 19.5 % — ABNORMAL HIGH (ref 12.2–15.2)
WBC ADJUSTED: 2.9 10*9/L — ABNORMAL LOW (ref 3.6–11.2)

## 2022-09-13 MED ORDER — ONDANSETRON 4 MG DISINTEGRATING TABLET
ORAL_TABLET | Freq: Four times a day (QID) | ORAL | 0 refills | 3 days | PRN
Start: 2022-09-13 — End: 2022-09-20

## 2022-09-13 MED ORDER — OXYCODONE 10 MG TABLET
ORAL_TABLET | Freq: Four times a day (QID) | ORAL | 0 refills | 3 days | PRN
Start: 2022-09-13 — End: 2022-09-18

## 2022-09-13 MED ORDER — DOCUSATE SODIUM 100 MG CAPSULE
ORAL_CAPSULE | Freq: Two times a day (BID) | ORAL | 0 refills | 30 days
Start: 2022-09-13 — End: 2022-10-13

## 2022-09-13 MED ADMIN — oxyCODONE (ROXICODONE) immediate release tablet 5 mg: 5 mg | ORAL | @ 03:00:00 | Stop: 2022-09-24

## 2022-09-13 MED ADMIN — heparin, porcine (PF) injection 5,000 Units: 5000 [IU] | SUBCUTANEOUS | @ 10:00:00

## 2022-09-13 MED ADMIN — albuterol 2.5 mg /3 mL (0.083 %) nebulizer solution 2.5 mg: 2.5 mg | RESPIRATORY_TRACT | @ 01:00:00

## 2022-09-13 MED ADMIN — insulin lispro (HumaLOG) injection 0-20 Units: 0-20 [IU] | SUBCUTANEOUS | @ 18:00:00

## 2022-09-13 MED ADMIN — insulin lispro (HumaLOG) injection 0-20 Units: 0-20 [IU] | SUBCUTANEOUS | @ 22:00:00

## 2022-09-13 MED ADMIN — oxyCODONE (ROXICODONE) immediate release tablet 5 mg: 5 mg | ORAL | @ 08:00:00 | Stop: 2022-09-24

## 2022-09-13 MED ADMIN — acetaminophen (TYLENOL) tablet 650 mg: 650 mg | ORAL | @ 10:00:00

## 2022-09-13 MED ADMIN — albuterol 2.5 mg /3 mL (0.083 %) nebulizer solution 2.5 mg: 2.5 mg | RESPIRATORY_TRACT | @ 14:00:00

## 2022-09-13 MED ADMIN — docusate sodium (COLACE) capsule 100 mg: 100 mg | ORAL | @ 02:00:00

## 2022-09-13 MED ADMIN — insulin lispro (HumaLOG) injection 0-20 Units: 0-20 [IU] | SUBCUTANEOUS | @ 14:00:00

## 2022-09-13 MED ADMIN — albuterol 2.5 mg /3 mL (0.083 %) nebulizer solution 2.5 mg: 2.5 mg | RESPIRATORY_TRACT | @ 21:00:00

## 2022-09-13 MED ADMIN — heparin, porcine (PF) injection 5,000 Units: 5000 [IU] | SUBCUTANEOUS | @ 19:00:00

## 2022-09-13 MED ADMIN — hydrALAZINE (APRESOLINE) tablet 50 mg: 50 mg | ORAL | @ 02:00:00

## 2022-09-13 MED ADMIN — insulin lispro (HumaLOG) injection 0-20 Units: 0-20 [IU] | SUBCUTANEOUS | @ 02:00:00

## 2022-09-13 MED ADMIN — atorvastatin (LIPITOR) tablet 80 mg: 80 mg | ORAL | @ 02:00:00

## 2022-09-13 MED ADMIN — fluticasone furoate-vilanterol (BREO ELLIPTA) 200-25 mcg/dose inhaler 1 puff: 1 | RESPIRATORY_TRACT | @ 14:00:00

## 2022-09-13 MED ADMIN — bumetanide (BUMEX) tablet 1 mg: 1 mg | ORAL | @ 14:00:00

## 2022-09-13 MED ADMIN — polyethylene glycol (MIRALAX) packet 17 g: 17 g | ORAL | @ 14:00:00

## 2022-09-13 MED ADMIN — docusate sodium (COLACE) capsule 100 mg: 100 mg | ORAL | @ 14:00:00

## 2022-09-13 MED ADMIN — acetaminophen (TYLENOL) tablet 650 mg: 650 mg | ORAL | @ 18:00:00

## 2022-09-13 MED ADMIN — pantoprazole (Protonix) EC tablet 40 mg: 40 mg | ORAL | @ 14:00:00

## 2022-09-13 MED ADMIN — albuterol 2.5 mg /3 mL (0.083 %) nebulizer solution 2.5 mg: 2.5 mg | RESPIRATORY_TRACT | @ 09:00:00

## 2022-09-13 MED ADMIN — montelukast (SINGULAIR) tablet 10 mg: 10 mg | ORAL | @ 02:00:00

## 2022-09-13 MED ADMIN — terazosin (HYTRIN) capsule 5 mg: 5 mg | ORAL | @ 02:00:00

## 2022-09-13 NOTE — Unmapped (Signed)
COLORECTAL SURGERY PROGRESS NOTES    Assessment/Plan:   POD#3 s/p lap extended right colectomy for mid-transverse colon cancer (DOS: 09/10/22 by Dr. Elenore Rota):    Postop status:   - abd benign, continue recovery and walk more today. PT/OT recs are HHPT/OT. VSS, labs stable.   - OOB freq, ambulate at least tid; DVT proph w/ subcutaneous hep & scds  - Pathology: pending  - EDD: 2-3 days pending bowel fxn  ________________________________________________________________________________________  ________________________________________________________________________________________    Active Problems upon Admission:  -Transverse Colon Cancer: surg management as above, await final path.   -CAD, hx MI status post stenting 1998 and 2013: will hold Plavix (Dr. Celine Mans)   -Hx TIA 10 years ago, no sequelae   -HTN: cont home Hydralazine, metoprolol succinate  w/ parameters, monitor VS.  -CLL, in remission, on Acalbrutinib Elyse Hsu)   -CKD (cr ranges 1.5-2.5 in 2023): no nephrotoxic medications   -HLD: cont home atorvastatin  -GERD: home Pepcid, Carafate    -DMT2: hold home meds, SSI postop w/ achs BGs  -Anemia of chronic dz: preop Hgb 9.7, will monitor postop and transfuse if <7 (<8 if cardiac hx) or symptoms  -BPH: cont home Cardura   -Asthma: cont home ipratropium-albuterol, mometasone-formoterol, zafirlukast   -CHF: cont home bumetanide   -Wheelchair dependent     Subjective:   NAEON.  Feeling better this morning. Now having bowel function.  Tolerating a diet.    Objective:     Vital signs in last 24 hours:  Temp:  [36.4 ??C (97.5 ??F)-36.8 ??C (98.2 ??F)] 36.8 ??C (98.2 ??F)  Pulse:  [64-75] 74  Resp:  [16-18] 16  BP: (138-148)/(38-56) 138/56  MAP (mmHg):  [69-70] 70  SpO2:  [96 %-97 %] 97 %    Intake/Output last 24 hours:  I/O last 3 completed shifts:  In: 1725.8 [P.O.:1020; I.V.:705.8]  Out: 1925 [Urine:1925]    Physical Exam:    NAD, RRR  Abd soft, nt, nd, incs c/d/i with dermabond    Data Review:    All lab results last 24 hours:    Recent Results (from the past 24 hour(s))   POCT Glucose    Collection Time: 09/12/22 12:12 PM   Result Value Ref Range    Glucose, POC 176 70 - 179 mg/dL    Operator ID Orlene Erm, [place]    POCT Glucose    Collection Time: 09/12/22  3:21 PM   Result Value Ref Range    Glucose, POC 201 (H) 70 - 179 mg/dL    Operator ID Orlene Erm, [place]    POCT Glucose    Collection Time: 09/12/22  9:06 PM   Result Value Ref Range    Glucose, POC 261 (H) 70 - 179 mg/dL    Operator ID Allen-Shabaze, Melody    Basic Metabolic Panel    Collection Time: 09/13/22  4:37 AM   Result Value Ref Range    Sodium 136 136 - 145 mmol/L    Potassium 3.9 3.5 - 5.1 mmol/L    Chloride 104 98 - 107 mmol/L    CO2 27.0 20.0 - 31.0 mmol/L    Anion Gap 5 3 - 11 mmol/L    BUN 23 9 - 23 mg/dL    Creatinine 1.61 (H) 0.73 - 1.18 mg/dL    BUN/Creatinine Ratio 16     eGFR CKD-EPI (2021) Male 49 (L) >=60 mL/min/1.71m2    Glucose 209 (H) 70 - 179 mg/dL    Calcium 8.2 (L) 8.7 - 10.4 mg/dL  CBC    Collection Time: 09/13/22  4:37 AM   Result Value Ref Range    WBC 2.9 (L) 3.6 - 11.2 10*9/L    RBC 3.25 (L) 4.26 - 5.60 10*12/L    HGB 8.2 (L) 12.9 - 16.5 g/dL    HCT 95.2 (L) 84.1 - 48.0 %    MCV 79.8 77.6 - 95.7 fL    MCH 25.1 (L) 25.9 - 32.4 pg    MCHC 31.5 (L) 32.0 - 36.0 g/dL    RDW 32.4 (H) 40.1 - 15.2 %    MPV 9.1 6.8 - 10.7 fL    Platelet 99 (L) 150 - 450 10*9/L   Type and Screen    Collection Time: 09/13/22  4:37 AM   Result Value Ref Range    Antibody Screen NEG      I have reviewed the labs and studies from the last 24 hours.

## 2022-09-14 LAB — CBC
HEMATOCRIT: 25.7 % — ABNORMAL LOW (ref 39.0–48.0)
HEMOGLOBIN: 8.1 g/dL — ABNORMAL LOW (ref 12.9–16.5)
MEAN CORPUSCULAR HEMOGLOBIN CONC: 31.4 g/dL — ABNORMAL LOW (ref 32.0–36.0)
MEAN CORPUSCULAR HEMOGLOBIN: 25.1 pg — ABNORMAL LOW (ref 25.9–32.4)
MEAN CORPUSCULAR VOLUME: 80.1 fL (ref 77.6–95.7)
MEAN PLATELET VOLUME: 8.8 fL (ref 6.8–10.7)
PLATELET COUNT: 128 10*9/L — ABNORMAL LOW (ref 150–450)
RED BLOOD CELL COUNT: 3.21 10*12/L — ABNORMAL LOW (ref 4.26–5.60)
RED CELL DISTRIBUTION WIDTH: 19.5 % — ABNORMAL HIGH (ref 12.2–15.2)
WBC ADJUSTED: 3.2 10*9/L — ABNORMAL LOW (ref 3.6–11.2)

## 2022-09-14 LAB — MAGNESIUM: MAGNESIUM: 1.9 mg/dL (ref 1.6–2.6)

## 2022-09-14 MED ORDER — SIMETHICONE 80 MG CHEWABLE TABLET
ORAL_TABLET | Freq: Four times a day (QID) | ORAL | 0 refills | 13 days | Status: CP | PRN
Start: 2022-09-14 — End: 2022-10-14
  Filled 2022-09-14: qty 100, 13d supply, fill #0

## 2022-09-14 MED ADMIN — hydrALAZINE (APRESOLINE) tablet 50 mg: 50 mg | ORAL | @ 13:00:00 | Stop: 2022-09-14

## 2022-09-14 MED ADMIN — heparin, porcine (PF) injection 5,000 Units: 5000 [IU] | SUBCUTANEOUS | @ 11:00:00 | Stop: 2022-09-14

## 2022-09-14 MED ADMIN — metoPROLOL succinate (Toprol-XL) 24 hr tablet 25 mg: 25 mg | ORAL | @ 13:00:00 | Stop: 2022-09-14

## 2022-09-14 MED ADMIN — simethicone (MYLICON) chewable tablet 160 mg: 160 mg | ORAL | @ 19:00:00 | Stop: 2022-09-14

## 2022-09-14 MED ADMIN — heparin, porcine (PF) injection 5,000 Units: 5000 [IU] | SUBCUTANEOUS | @ 19:00:00 | Stop: 2022-09-14

## 2022-09-14 MED ADMIN — heparin, porcine (PF) injection 5,000 Units: 5000 [IU] | SUBCUTANEOUS | @ 03:00:00

## 2022-09-14 MED ADMIN — bumetanide (BUMEX) tablet 1 mg: 1 mg | ORAL | @ 13:00:00 | Stop: 2022-09-14

## 2022-09-14 MED ADMIN — acetaminophen (TYLENOL) tablet 650 mg: 650 mg | ORAL | @ 17:00:00 | Stop: 2022-09-14

## 2022-09-14 MED ADMIN — polyethylene glycol (MIRALAX) packet 17 g: 17 g | ORAL | @ 13:00:00 | Stop: 2022-09-14

## 2022-09-14 MED ADMIN — fluticasone furoate-vilanterol (BREO ELLIPTA) 200-25 mcg/dose inhaler 1 puff: 1 | RESPIRATORY_TRACT | @ 13:00:00 | Stop: 2022-09-14

## 2022-09-14 MED ADMIN — montelukast (SINGULAIR) tablet 10 mg: 10 mg | ORAL | @ 03:00:00

## 2022-09-14 MED ADMIN — acetaminophen (TYLENOL) tablet 650 mg: 650 mg | ORAL | @ 11:00:00 | Stop: 2022-09-14

## 2022-09-14 MED ADMIN — insulin lispro (HumaLOG) injection 0-20 Units: 0-20 [IU] | SUBCUTANEOUS | @ 13:00:00 | Stop: 2022-09-14

## 2022-09-14 MED ADMIN — albuterol 2.5 mg /3 mL (0.083 %) nebulizer solution 2.5 mg: 2.5 mg | RESPIRATORY_TRACT | @ 09:00:00 | Stop: 2022-09-14

## 2022-09-14 MED ADMIN — hydrALAZINE (APRESOLINE) tablet 50 mg: 50 mg | ORAL | @ 03:00:00

## 2022-09-14 MED ADMIN — albuterol 2.5 mg /3 mL (0.083 %) nebulizer solution 2.5 mg: 2.5 mg | RESPIRATORY_TRACT

## 2022-09-14 MED ADMIN — oxyCODONE (ROXICODONE) immediate release tablet 5 mg: 5 mg | ORAL | @ 03:00:00 | Stop: 2022-09-24

## 2022-09-14 MED ADMIN — atorvastatin (LIPITOR) tablet 80 mg: 80 mg | ORAL | @ 03:00:00

## 2022-09-14 MED ADMIN — insulin lispro (HumaLOG) injection 0-20 Units: 0-20 [IU] | SUBCUTANEOUS | @ 17:00:00 | Stop: 2022-09-14

## 2022-09-14 MED ADMIN — hydrALAZINE (APRESOLINE) tablet 50 mg: 50 mg | ORAL | @ 19:00:00 | Stop: 2022-09-14

## 2022-09-14 MED ADMIN — insulin lispro (HumaLOG) injection 0-20 Units: 0-20 [IU] | SUBCUTANEOUS | @ 03:00:00

## 2022-09-14 MED ADMIN — terazosin (HYTRIN) capsule 5 mg: 5 mg | ORAL | @ 03:00:00

## 2022-09-14 MED ADMIN — albuterol 2.5 mg /3 mL (0.083 %) nebulizer solution 2.5 mg: 2.5 mg | RESPIRATORY_TRACT | @ 15:00:00 | Stop: 2022-09-14

## 2022-09-14 MED ADMIN — docusate sodium (COLACE) capsule 100 mg: 100 mg | ORAL | @ 13:00:00 | Stop: 2022-09-14

## 2022-09-14 MED ADMIN — pantoprazole (Protonix) EC tablet 40 mg: 40 mg | ORAL | @ 13:00:00 | Stop: 2022-09-14

## 2022-09-14 MED ADMIN — albuterol 2.5 mg /3 mL (0.083 %) nebulizer solution 2.5 mg: 2.5 mg | RESPIRATORY_TRACT | @ 20:00:00 | Stop: 2022-09-14

## 2022-09-14 MED ADMIN — magnesium oxide (MAG-OX) tablet 400 mg: 400 mg | ORAL | @ 13:00:00 | Stop: 2022-09-14

## 2022-09-14 MED ADMIN — melatonin tablet 3 mg: 3 mg | ORAL | @ 05:00:00

## 2022-09-14 MED ADMIN — docusate sodium (COLACE) capsule 100 mg: 100 mg | ORAL | @ 03:00:00

## 2022-09-14 MED ADMIN — acetaminophen (TYLENOL) tablet 650 mg: 650 mg | ORAL | @ 05:00:00

## 2022-09-14 NOTE — Unmapped (Cosign Needed)
Discharge Summary    Admit date: 09/10/2022    Discharge date and time: 09/14/22    Discharge to:  Home    Discharge Service: General Surgery    Discharge Attending Physician: Mickle Asper, MD    Discharge  Diagnoses: transverse colon cancer    Secondary Diagnosis: Active Problems:    * No active hospital problems. *  Resolved Problems:    * No resolved hospital problems. *      OR Procedures:    LAPAROSCOPIC EXTENDED ILEOCOLECTOMY  Date  09/10/2022  -------------------     Ancillary Procedures: no procedures    Discharge Day Services: none    Subjective   No acute events overnight. Pain Controlled. No fever or chills.    Objective   Patient Vitals for the past 8 hrs:   BP Temp Temp src Pulse Resp SpO2   09/14/22 1332 144/48 -- -- 74 -- 99 %   09/14/22 0933 -- -- -- 79 18 99 %   09/14/22 0750 152/52 36.6 ??C (97.9 ??F) Oral 75 18 99 %     I/O this shift:  In: 240 [P.O.:240]  Out: -     Abdomen: soft, non-tender, non-distended, no hernia or masses palpated.  RRR, NAD    Hospital Course:  The patient was admitted to the floor after a Laparoscopic extended right ileocolectomy with ileo to distal transverse colon anastomosis with Dr. Elenore Rota  on 09/10/22 for Transverse Colon Cancer. Postop pain was controlled with oral pain meds. Diet was gradually advanced which was tolerated well. Bowel function returned, and they were able to void.   Pt was therefore discharged to home in stable condition on 09/14/22.  I personally spent 45 minutes in discharge planning services including the coordination of care and patient education.      Follow up plan(s):  - on 09/30/22 with Dr. Elenore Rota, Colorectal Surgery    Pathology from surgery:  pending        Condition at Discharge: Improved  Discharge Medications:      Medication List      START taking these medications     simethicone 80 MG chewable tablet; Commonly known as: MYLICON; Chew 2   tablets (160 mg total) every six (6) hours as needed.     CHANGE how you take these medications clopidogrel 75 mg tablet; Commonly known as: PLAVIX; Take 1 tablet (75   mg total) by mouth daily. Resume taking on 09/19/22; What changed:   additional instructions     CONTINUE taking these medications     albuterol 2.5 mg /3 mL (0.083 %) Nebu 3 mL, albuterol 5 mg/mL Nebu 0.5   mL   aspirin 81 MG tablet; Commonly known as: ECOTRIN   atorvastatin 80 MG tablet; Commonly known as: LIPITOR   bumetanide 1 MG tablet; Commonly known as: BUMEX; Take 1 tablet (1 mg   total) by mouth daily.   CALQUENCE 100 mg tablet; Generic drug: acalabrutinib; Take 1 tablet (100   mg total) by mouth Two (2) times a day. Swallow whole with water. Do not   chew, crush, dissolve, or cut tablets.   cholecalciferol (vitamin D3-125 mcg (5,000 unit)) 125 mcg (5,000 unit)   tablet   DENTA 5000 PLUS 1.1 % Crea; Generic drug: fluoride (sodium)   doxazosin 4 MG tablet; Commonly known as: CARDURA   DULERA 200-5 mcg/actuation Hfaa; Generic drug: mometasone-formoterol   famotidine 40 MG tablet; Commonly known as: PEPCID   folic acid 800 MCG tablet;  Commonly known as: FOLVITE   * glipiZIDE 5 MG 24 hr tablet; Commonly known as: GLUCOTROL XL   * glipiZIDE 2.5 MG 24 hr tablet; Commonly known as: GLUCOTROL XL   hydrALAZINE 50 MG tablet; Commonly known as: APRESOLINE   ipratropium-albuterol 0.5-2.5 mg/3 mL nebulizer; Commonly known as:   DUO-NEB   JARDIANCE 10 mg tablet; Generic drug: empagliflozin   metoPROLOL succinate 25 MG 24 hr tablet; Commonly known as: Toprol-XL   OSTEO BI-FLEX 250-200 mg Tab; Generic drug: glucosamine-chondroitin   potassium chloride 20 MEQ ER tablet   PRESERVISION AREDS 2 ORAL   sucralfate 100 mg/mL suspension; Commonly known as: CARAFATE; Take 10 mL   (1 g total) by mouth nightly.   VICTOZA 2-PAK 0.6 mg/0.1 mL (18 mg/3 mL) injection; Generic drug:   liraglutide   zafirlukast 20 MG tablet; Commonly known as: ACCOLATE  * This list has 2 medication(s) that are the same as other medications   prescribed for you. Read the directions carefully, and ask your doctor or   other care provider to review them with you.     STOP taking these medications     ondansetron 4 MG tablet; Commonly known as: ZOFRAN       Pending Test Results: pathology    Discharge Instructions:  Activity:   Activity Instructions       Activity as tolerated      Lifting restrictions (specify)      Weight restriction of 10 lbs.    OK to shower (no bath)            Diet:  Diet Instructions       Discharge diet (specify)      Discharge Nutrition Therapy: Regular          Other Instructions:  Other Instructions       Call MD for:  difficulty breathing, headache or visual disturbances      Call MD for:  hives      Call MD for:  persistent dizziness or light-headedness      Call MD for:  persistent nausea or vomiting      Call MD for:  redness, tenderness, or signs of infection (pain, swelling, redness, odor or green/yellow discharge around incision site)      Call MD for:  severe uncontrolled pain      Call MD for: Temperature > 38.5 Celsius ( > 101.3 Fahrenheit)      Discharge instructions      [facility]  Division of Colorectal Surgery  Discharge Instructions:    PLAVIX  Restart this medication on 09/19/21.     ACALABRUTINIB (CALQUENCE)   Restart this medication 1 week (7 days) postop, on 09/18/21, per Dr. Jomarie Longs.    ACTIVITY  Ambulate several times a day to promote good circulation. Light treadmill, walking, or biking is fine. Do not lift heavy weights or perform rigorous cardiovascular activity until postop appointment.  Do not drive a car or operate hazardous machinery while taking narcotic pain medication. You may resume driving when you 1) are not taking narcotics, and 2) feel safe having to apply brakes suddenly if you were driving. For most patients, this is 1-2 weeks after surgery.   Advance activity as tolerated, but avoid strenuous exercise and straining.  You may climb stairs.  Do not lift >10 lbs for 6 weeks after surgery.   It will be at least 6 weeks before your energy levels return to what they were before surgery.  DIET  Drink plenty of liquids (pain medications such as Ultram/tramadol, Norco/hydrocodone, or Percocet/oxycodone may cause constipation), 6-8 glasses of water daily.  You may eat a regular diet, although we advise going slow with smaller more frequent meals.   Take Miralax (a liquid) or Colace (a capsule), 1-2 doses per day, to keep your stools soft while taking pain medications ONLY if you need to.  If your stools are loose, do not take it.  Call office for prolonged nausea or vomiting.    Keep your urine a light yellow color; this is a good indicator of hydration. If it is straw or amber colored, call our office as you could be getting dehydrated.  Do not drink alcohol while taking narcotic pain medication.    DRESSING AND WOUND CARE  There is clear Dermabond (skin glue) on your incision sites, simply shower to keep it clean.  Do not try to scrub or remove it.  It will flake off within ~2-3 weeks.  You may shower.  Do not take tub baths until your follow up appointment.   A small amount of bleeding or bruising is not unusual.  Call office for excessive bleeding, drainage, warmth, separation of skin edges, or swelling from the incision sites.    BOWEL MOVEMENTS  Your bowels may move fast, loose, and urgently for the first week or two after surgery. This is ok. It is usually from a combination of the new colon connection, any bowel prep you took, and the stool softeners used to counteract the pain medicine side effect of constipation. Over the next few weeks, bms should become more formed.  When you go home, you may start taking a fiber supplement; we recommend Citrucel (less gas-producing), or Metamucil coarse-ground supplement, 1/2 tbsp nightly. At 1 week, you can increase to 1 tbsp nightly. If you have continuous watery diarrhea multiple times daily, please call our office.  If you have blood in your stools, please call our office. PAIN  Acetaminophen/Tylenol: take 650mg  every 8 hours as needed for pain or fever. DO NOT take this if you were prescribed a narcotic pain medication that contains acetaminophen/Tylenol (such as Norco or Percocet). Do not take this if you have liver disease.   Walk frequently. You may use heat or ice to your abdomen &/or perineal area as needed.  Take narcotic pain medications &/or muscle relaxers as prescribed only if needed.   Narcotic/Pain medication Policy: After your upcoming surgery, you may receive a narcotic pain medication.  This poses a risk of addiction and dependency along with fatal outcomes if not used as prescribed. It is important to understand that these drugs can be very helpful, but have the potential for misuse and are therefore closely controlled by the local, state, and federal governments. Our policy is in place to help use these medications appropriately and keep you safe.  You can only receive a certain amount of narcotics at the time of discharge. They are for acute, postoperative pain only.    The [place] STOP Act (effective Jan 2018) limits the amount of narcotics that can be prescribed postoperatively, as does [place].   That being said, if you do need additional pain medications once home, you may call our office.     Refills, if warranted, are available during office hours, Monday through Friday from 8 AM to 4:30 PM. Please plan accordingly.   No refills of narcotic pain medications are available after hours or on the weekend from the provider on  call.   Your refill may take up to forty-eight (48 hours) to fill.  No refills for lost or stolen medications under any circumstance.   Our providers will not refill narcotic medications for any longer than 14 days total after surgery.    Our providers do not treat chronic pain. If you are followed by an outpatient Pain Clinic or if you receive narcotics regularly from another provider, please make sure they are aware you are having surgery.  As the patient, it is your responsibility to find out their policy for acute pain control in the postoperative setting and how that may affect your pain contract.     FOLLOW-UP CARE  Call our office for excessive pain, fever >101, prolonged nausea, difficulty breathing, drainage, urinary issues, excessive diarrhea, bloody stools, or any other questions.   For questions/forms about FMLA or disability, contact [employer] @ ph [phone], fax [phone], or [email or web address]  Call the nurse line for all medical questions during office hours [phone]).  After hours, call the physician on call for all urgent medical questions [phone]).  Visit our website for other helpful post-operative information & resources: [email or web address]          Labs and Other Follow-ups after Discharge:  Follow Up instructions and Outpatient Referrals     Ambulatory referral to Home Health      Is this a [facility] or [facility] Patient?: No    Physician to follow patient's care: Referring Provider    Disciplines requested:  Physical Therapy  Occupational Therapy       Physical Therapy requested:  Home safety evaluation  Evaluate and treat       Occupational Therapy Requested:  Home safety evaluation  Evaluate and treat       Do you want ongoing co-management?: No    Care coordination required?: No    Call MD for:  difficulty breathing, headache or visual disturbances      Call MD for:  hives      Call MD for:  persistent dizziness or light-headedness      Call MD for:  persistent nausea or vomiting      Call MD for:  redness, tenderness, or signs of infection (pain, swelling,   redness, odor or green/yellow discharge around incision site)      Call MD for:  severe uncontrolled pain      Call MD for: Temperature > 38.5 Celsius ( > 101.3 Fahrenheit)      Discharge instructions          Future Appointments:  Appointments which have been scheduled for you      Sep 30, 2022 11:00 AM  (Arrive by 10:30 AM)  POST-OP 15 with Mickle Asper, MD  [facility] SURGERY [place] [facility] [place]) [address]  [address]  [place] [address]  [phone]

## 2022-09-14 NOTE — Unmapped (Cosign Needed)
COLORECTAL SURGERY PROGRESS NOTES    Assessment/Plan:   POD#4 s/p lap extended right colectomy for mid-transverse colon cancer (DOS: 09/10/22 by Dr. Elenore Rota):    Postop status:   - abd benign, having bowel function. PT/OT recs are HHPT/OT. VSS, labs stable.   - Some gas pains today.   - OOB freq, ambulate at least tid; DVT proph w/ subcutaneous hep & scds  - Pathology: pending  - EDD: today vs tmrw pending bowel fxn  ________________________________________________________________________________________  ________________________________________________________________________________________    Active Problems upon Admission:  -Transverse Colon Cancer: surg management as above, await final path.   -CAD, hx MI status post stenting 1998 and 2013: will hold Plavix (Dr. Celine Mans)   -Hx TIA 10 years ago, no sequelae   -HTN: cont home Hydralazine, metoprolol succinate  w/ parameters, monitor VS.  -CLL, in remission, on Acalbrutinib Elyse Hsu)   -CKD (cr ranges 1.5-2.5 in 2023): no nephrotoxic medications   -HLD: cont home atorvastatin  -GERD: home Pepcid, Carafate    -DMT2: hold home meds, SSI postop w/ achs BGs  -Anemia of chronic dz: preop Hgb 9.7, will monitor postop and transfuse if <7 (<8 if cardiac hx) or symptoms  -BPH: cont home Cardura   -Asthma: cont home ipratropium-albuterol, mometasone-formoterol, zafirlukast   -CHF: cont home bumetanide   -Wheelchair dependent     Subjective:   NAEON.  Did not sleep well, having some cramping gas pain.    Objective:     Vital signs in last 24 hours:  Temp:  [36.7 ??C (98.1 ??F)-37 ??C (98.6 ??F)] 37 ??C (98.6 ??F)  Pulse:  [72-79] 79  Resp:  [14-20] 14  BP: (135-166)/(41-49) 149/49  MAP (mmHg):  [67-83] 78  SpO2:  [96 %-100 %] 97 %    Intake/Output last 24 hours:  I/O last 3 completed shifts:  In: 240 [P.O.:240]  Out: 200 [Urine:200]    Physical Exam:    NAD, RRR  Abd soft, nt, nd, incs c/d/i with dermabond    Data Review:    All lab results last 24 hours:    Recent Results (from the past 24 hour(s))   POCT Glucose    Collection Time: 09/13/22  8:04 AM   Result Value Ref Range    Glucose, POC 209 (H) 70 - 179 mg/dL    Operator ID Riley Kill, [place]    POCT Glucose    Collection Time: 09/13/22 12:16 PM   Result Value Ref Range    Glucose, POC 316 (H) 70 - 179 mg/dL    Operator ID Riley Kill, [place]    POCT Glucose    Collection Time: 09/13/22  3:31 PM   Result Value Ref Range    Glucose, POC 265 (H) 70 - 179 mg/dL    Operator ID Wynonia Sours    POCT Glucose    Collection Time: 09/13/22  4:45 PM   Result Value Ref Range    Glucose, POC 241 (H) 70 - 179 mg/dL    Operator ID Glenna Fellows    POCT Glucose    Collection Time: 09/13/22  9:28 PM   Result Value Ref Range    Glucose, POC 226 (H) 70 - 179 mg/dL    Operator ID Lauretta Chester    Magnesium Level    Collection Time: 09/14/22  4:28 AM   Result Value Ref Range    Magnesium 1.9 1.6 - 2.6 mg/dL   CBC    Collection Time: 09/14/22  4:29 AM   Result Value Ref Range  WBC 3.2 (L) 3.6 - 11.2 10*9/L    RBC 3.21 (L) 4.26 - 5.60 10*12/L    HGB 8.1 (L) 12.9 - 16.5 g/dL    HCT 96.2 (L) 95.2 - 48.0 %    MCV 80.1 77.6 - 95.7 fL    MCH 25.1 (L) 25.9 - 32.4 pg    MCHC 31.4 (L) 32.0 - 36.0 g/dL    RDW 84.1 (H) 32.4 - 15.2 %    MPV 8.8 6.8 - 10.7 fL    Platelet 128 (L) 150 - 450 10*9/L     I have reviewed the labs and studies from the last 24 hours.

## 2022-09-15 MED ORDER — SUCRALFATE 100 MG/ML ORAL SUSPENSION
5 refills | 0 days
Start: 2022-09-15 — End: ?

## 2022-09-15 NOTE — Unmapped (Signed)
Pt should still have refills

## 2022-09-17 MED ORDER — SUCRALFATE 100 MG/ML ORAL SUSPENSION
5 refills | 0 days
Start: 2022-09-17 — End: ?

## 2022-09-17 NOTE — Unmapped (Signed)
Refill request not appropriate as last refill was on 08/26/22 and pt was ordered 3 refills.

## 2022-09-20 NOTE — Unmapped (Signed)
Patient recently discharged from the hospital on 09/14/22 s/p admission for laparoscopic extended right ileocolectomy with ileo to distal transverse colon anastomosis. Called and spoke with patient. Spoke with daughter, Christian Abbott. We reviewed the pathology, stage 2 colon cancer. Follow up appointment with Dr. Jomarie Longs, oncology already set up on 10/01/22.    Doing well. Denies any abdominal pain. Tolerating a regular diet without N/V. No f/c. Passing flatus and having bowel movements, non-bloody.    Postop appt with Dr. Elenore Rota on 09/30/22

## 2022-09-21 NOTE — Unmapped (Signed)
I called patient daughter yesterday and reviewed pathology of recent colon cancer surgery.   With [id number], MMR-deficient colon adenocarcinoma, I do not recommend adjuvant chemotherapy given his age and comorbidities.    I will review pathology in more detail with patient and family in our clinic visit next week.     Elyse Hsu, MD  [facility]

## 2022-09-27 NOTE — Unmapped (Signed)
Christian Abbott has been contacted in regards to their refill of Calquence 100mg . At this time, they have declined refill due to medication being on hold due to surgery. Refill assessment call date has been updated per the patient's request.

## 2022-09-30 ENCOUNTER — Ambulatory Visit: Admit: 2022-09-30 | Discharge: 2022-10-01 | Payer: MEDICARE | Attending: Surgery | Primary: Surgery

## 2022-09-30 DIAGNOSIS — D801 Nonfamilial hypogammaglobulinemia: Principal | ICD-10-CM

## 2022-09-30 DIAGNOSIS — Z09 Encounter for follow-up examination after completed treatment for conditions other than malignant neoplasm: Principal | ICD-10-CM

## 2022-09-30 NOTE — Unmapped (Signed)
Patient Name: Christian Abbott  Patient Age: 88 y.o.  Encounter Date: 09/30/2022    REFERRING PHYSICIAN:  Arturo Morton, MD  [address]  [address] 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  No issues at home.  He is eating well and having bowel movements.  He is walking.  He does not have pain or fevers       MEDICATIONS:  Current Outpatient Medications   Medication Sig Dispense Refill    acalabrutinib (CALQUENCE) 100 mg tablet Take 1 tablet (100 mg total) by mouth Two (2) times a day. Swallow whole with water. Do not chew, crush, dissolve, or cut tablets. (Patient taking differently: Take 1 tablet (100 mg total) by mouth two (2) times a day. Oncologist recommends to hold for 7 days post op - Dr Jomarie Longs. Daughter has med if needed.) 60 tablet 5    albuterol 2.5 mg /3 mL (0.083 %) Nebu 3 mL, albuterol 5 mg/mL Nebu 0.5 mL Inhale 2.5 mg every six (6) hours.      atorvastatin (LIPITOR) 80 MG tablet Take 1 tablet (80 mg total) by mouth at bedtime.      bumetanide (BUMEX) 1 MG tablet Take 1 tablet (1 mg total) by mouth daily. 30 tablet 0    cholecalciferol, vitamin D3, 5,000 unit Tab Take 1 tablet (125 mcg total) by mouth daily.      clopidogrel (PLAVIX) 75 mg tablet Take 1 tablet (75 mg total) by mouth daily. Resume taking on 09/19/22 30 tablet 0    DENTA 5000 PLUS 1.1 % Crea Apply 1 Application to teeth daily.      doxazosin (CARDURA) 4 MG tablet Take 1 tablet (4 mg total) by mouth nightly.  1    empagliflozin (JARDIANCE) 10 mg tablet Take 1 tablet (10 mg total) by mouth daily.      famotidine (PEPCID) 40 MG tablet Take 1 tablet (40 mg total) by mouth two (2) times a day.      folic acid (FOLVITE) 800 MCG tablet Take 1 tablet (800 mcg total) by mouth daily.      glipiZIDE (GLUCOTROL XL) 2.5 MG 24 hr tablet Take 1 tablet (2.5 mg total) by mouth nightly.      glipiZIDE (GLUCOTROL XL) 5 MG 24 hr tablet Take 1 tablet (5 mg total) by mouth daily.      glucosamine-chondroitin (OSTEO BI-FLEX) 250-200 mg Tab Take 1 tablet by mouth daily.      hydrALAZINE (APRESOLINE) 50 MG tablet Take 1 tablet (50 mg total) by mouth Three (3) times a day.      ipratropium-albuterol (DUO-NEB) 0.5-2.5 mg/3 mL nebulizer 3 mL every four (4) hours as needed.  3    liraglutide (VICTOZA 2-PAK) 0.6 mg/0.1 mL (18 mg/3 mL) injection Inject 0.3 mL (1.8 mg total) under the skin nightly.  metoPROLOL succinate (TOPROL-XL) 25 MG 24 hr tablet Take 1 tablet (25 mg total) by mouth daily.      mometasone-formoterol (DULERA) 200-5 mcg/actuation HFAA Inhale 2 puffs two (2) times a day.      potassium chloride 20 MEQ ER tablet Take 1 tablet (20 mEq total) by mouth nightly.      simethicone (MYLICON) 80 MG chewable tablet Chew 2 tablets every six (6) hours as needed. 100 tablet 0    sucralfate (CARAFATE) 100 mg/mL suspension Take 10 mL (1 g total) by mouth nightly. 420 mL 3    vit C/E/Zn/coppr/lutein/zeaxan (PRESERVISION AREDS 2 ORAL) Take 1 tablet by mouth two (2) times a day.      zafirlukast (ACCOLATE) 20 MG tablet Take 1 tablet (20 mg total) by mouth two (2) times a day.      aspirin (ECOTRIN) 81 MG tablet Take 1 tablet (81 mg total) by mouth daily. Start when holding Plavix       No current facility-administered medications for this visit.         Objective :    Vital Signs for this encounter:  BSA: There is no height or weight on file to calculate BSA.  There were no vitals taken for this visit.      EXAM:  GENERAL:  No distress  ABDOMEN:  Soft, not distended, not significantly tender.  Wounds healing well.     RECTAL:  Not performed           DIAGNOSTIC STUDIES:   Surgical pathology is consistent with a moderately differentiated adenocarcinoma with mucinous features.  97 lymph nodes were examined and none contained cancer.  The lymph nodes did not involve chronic lymphocytic leukemia/lymphoma c/w his existing dx. the tumor extended to the visceral peritoneum consistent with T4 disease.  All surgical margins are negative      ASSESSMENT:   pT4N0 transverse colon cancer, resected Sep 10, 2022        PLAN: Patient is doing well clinically.  He is recovered nicely.  He is due to see Dr. Jomarie Longs later this week to discuss the pathology and any potential recommendations for chemotherapy.  There is a note in the computer that Dr. Jomarie Longs did discuss with the patient's daughter and chemotherapy was not recommended given his age and comorbidities.  He will need surveillance.  I think this is probably best done by Dr. Jomarie Longs given his underlying CLL

## 2022-10-01 ENCOUNTER — Ambulatory Visit: Admit: 2022-10-01 | Discharge: 2022-10-01 | Payer: MEDICARE

## 2022-10-01 DIAGNOSIS — C189 Malignant neoplasm of colon, unspecified: Principal | ICD-10-CM

## 2022-10-01 DIAGNOSIS — D509 Iron deficiency anemia, unspecified: Principal | ICD-10-CM

## 2022-10-01 DIAGNOSIS — C9110 Chronic lymphocytic leukemia of B-cell type not having achieved remission: Principal | ICD-10-CM

## 2022-10-01 LAB — CBC W/ AUTO DIFF
BASOPHILS ABSOLUTE COUNT: 0.1 10*9/L (ref 0.0–0.1)
BASOPHILS RELATIVE PERCENT: 0.7 %
EOSINOPHILS ABSOLUTE COUNT: 0.1 10*9/L (ref 0.0–0.5)
EOSINOPHILS RELATIVE PERCENT: 1 %
HEMATOCRIT: 33.7 % — ABNORMAL LOW (ref 39.0–48.0)
HEMOGLOBIN: 10.5 g/dL — ABNORMAL LOW (ref 12.9–16.5)
LYMPHOCYTES ABSOLUTE COUNT: 2.7 10*9/L (ref 1.1–3.6)
LYMPHOCYTES RELATIVE PERCENT: 35.7 %
MEAN CORPUSCULAR HEMOGLOBIN CONC: 31.2 g/dL — ABNORMAL LOW (ref 32.0–36.0)
MEAN CORPUSCULAR HEMOGLOBIN: 25.3 pg — ABNORMAL LOW (ref 25.9–32.4)
MEAN CORPUSCULAR VOLUME: 81 fL (ref 77.6–95.7)
MEAN PLATELET VOLUME: 7.3 fL (ref 6.8–10.7)
MONOCYTES ABSOLUTE COUNT: 0.6 10*9/L (ref 0.3–0.8)
MONOCYTES RELATIVE PERCENT: 8.1 %
NEUTROPHILS ABSOLUTE COUNT: 4.2 10*9/L (ref 1.8–7.8)
NEUTROPHILS RELATIVE PERCENT: 54.5 %
PLATELET COUNT: 165 10*9/L (ref 150–450)
RED BLOOD CELL COUNT: 4.16 10*12/L — ABNORMAL LOW (ref 4.26–5.60)
RED CELL DISTRIBUTION WIDTH: 19.9 % — ABNORMAL HIGH (ref 12.2–15.2)
WBC ADJUSTED: 7.6 10*9/L (ref 3.6–11.2)

## 2022-10-01 LAB — COMPREHENSIVE METABOLIC PANEL
ALBUMIN: 3 g/dL — ABNORMAL LOW (ref 3.5–5.0)
ALKALINE PHOSPHATASE: 131 U/L — ABNORMAL HIGH (ref 46–116)
ALT (SGPT): 22 U/L (ref 12–78)
ANION GAP: 8 mmol/L (ref 3–11)
AST (SGOT): 15 U/L (ref 15–40)
BILIRUBIN TOTAL: 0.3 mg/dL (ref 0.2–1.0)
BLOOD UREA NITROGEN: 33 mg/dL — ABNORMAL HIGH (ref 8–20)
BUN / CREAT RATIO: 22
CALCIUM: 8.9 mg/dL (ref 8.5–10.1)
CHLORIDE: 107 mmol/L (ref 98–107)
CO2: 31.4 mmol/L (ref 21.0–32.0)
CREATININE: 1.48 mg/dL — ABNORMAL HIGH (ref 0.80–1.30)
EGFR CKD-EPI (2021) MALE: 46 mL/min/{1.73_m2} — ABNORMAL LOW (ref >=60)
GLUCOSE RANDOM: 214 mg/dL — ABNORMAL HIGH (ref 70–179)
POTASSIUM: 3.5 mmol/L (ref 3.5–5.0)
PROTEIN TOTAL: 5.7 g/dL — ABNORMAL LOW (ref 6.0–8.0)
SODIUM: 146 mmol/L — ABNORMAL HIGH (ref 135–145)

## 2022-10-01 LAB — IRON & TIBC
IRON SATURATION: 9 % — ABNORMAL LOW (ref 20–55)
IRON: 22 ug/dL — ABNORMAL LOW (ref 50–150)
TOTAL IRON BINDING CAPACITY: 254 ug/dL (ref 250–450)

## 2022-10-01 LAB — FERRITIN: FERRITIN: 116 ng/mL (ref 10.5–307.3)

## 2022-10-01 LAB — CEA: CARCINOEMBRYONIC ANTIGEN: 1.3 ng/mL (ref 0.0–5.0)

## 2022-10-01 MED ORDER — SUCRALFATE 100 MG/ML ORAL SUSPENSION
Freq: Every evening | ORAL | 4 refills | 42 days | Status: CP
Start: 2022-10-01 — End: ?

## 2022-10-01 NOTE — Unmapped (Signed)
Latest Reference Range & Units 10/01/22 13:59   WBC 3.6 - 11.2 10*9/L 7.6   RBC 4.26 - 5.60 10*12/L 4.16 (L)   HGB 12.9 - 16.5 g/dL 16.1 (L)   HCT 09.6 - 04.5 % 33.7 (L)   MCV 77.6 - 95.7 fL 81.0   MCH 25.9 - 32.4 pg 25.3 (L)   MCHC 32.0 - 36.0 g/dL 40.9 (L)   RDW 81.1 - 91.4 % 19.9 (H)   MPV 6.8 - 10.7 fL 7.3   Platelet 150 - 450 10*9/L 165   Neutrophils % % 54.5   Lymphocytes % % 35.7   Monocytes % % 8.1   Eosinophils % % 1.0   Basophils % % 0.7   Absolute Neutrophils 1.8 - 7.8 10*9/L 4.2   Absolute Lymphocytes 1.1 - 3.6 10*9/L 2.7   Absolute Monocytes  0.3 - 0.8 10*9/L 0.6   Absolute Eosinophils 0.0 - 0.5 10*9/L 0.1   Absolute Basophils  0.0 - 0.1 10*9/L 0.1   Anisocytosis Not Present  Moderate !   Hypochromasia Not Present  Slight !   Sodium 135 - 145 mmol/L 146 (H)   Potassium 3.5 - 5.0 mmol/L 3.5   Chloride 98 - 107 mmol/L 107   CO2 21.0 - 32.0 mmol/L 31.4   Bun 8 - 20 mg/dL 33 (H)   Creatinine 7.82 - 1.30 mg/dL 9.56 (H)   BUN/Creatinine Ratio  22   eGFR CKD-EPI (2021) Male >=60 mL/min/1.95m2 46 (L)   Anion Gap 3 - 11 mmol/L 8   Glucose 70 - 179 mg/dL 213 (H)   Calcium 8.5 - 10.1 mg/dL 8.9   Albumin 3.5 - 5.0 g/dL 3.0 (L)   Total Protein 6.0 - 8.0 g/dL 5.7 (L)   Total Bilirubin 0.2 - 1.0 mg/dL 0.3   SGOT (AST) 15 - 40 U/L 15   ALT 12 - 78 U/L 22   Alkaline Phosphatase 46 - 116 U/L 131 (H)   Iron 50 - 150 ug/dL 22 (L)   TIBC 086 - 578 ug/dL 469   Iron Saturation (%) 20 - 55 % 9 (L)   Ferritin 10.5 - 307.3 ng/mL 116.0   (L): Data is abnormally low  (H): Data is abnormally high  !: Data is abnormal

## 2022-10-02 NOTE — Unmapped (Signed)
Ordered [employer]; insurance info, Discharge Note from 09/14/22 and Path report from 09/10/22 uploaded.  ID [id number]

## 2022-10-04 NOTE — Unmapped (Signed)
HEMATOLOGY ONCOLOGY CLINIC:  INTERVAL FOLLOW UP  NOTE    PCP:  Arturo Morton, MD    DATE OF ENCOUNTER:  10/01/2022    INTERVAL HISTORY:  Christian Abbott is an 88 y.o. male who comes today for continued follow-up of chronic lymphocytic leukemia.  After recent colonoscopy showing a transverse colon  adenocarcinoma he underwent a partial colectomy on 09/10/22 with Dr Elenore Rota. Healing well post op. Family reports that he appears more energetic/up beat.  Now 3 weeks post op. Gained 5 lbs in the last 3 weeks.   Reports that he appetite has improved.  He has been holding calquence for the last 4 weeks - 1 week pre op.       ONCOLOGY HISTORY  Hematology/Oncology History Overview Note   88 y/o male with long-standing history of chronic lymphocytic leukemia initially diagnosed in 1998  Status post Rituxan based therapy for 8 weeks during that time  History of anemia treated with Procrit off and on  History of hypertension diabetes asthma hypercholesterolemia.  History of hospitalization in January 2014 for bilateral pneumonia [facility] for 3 weeks.     CLL (chronic lymphocytic leukemia) (CMS-HCC)   07/18/1997 Initial Diagnosis    CLL (chronic lymphocytic leukemia)           PAST MEDICAL HISTORY:     Patient Active Problem List   Diagnosis    CLL (chronic lymphocytic leukemia) (CMS-HCC)    Iron deficiency anemia    Hypogammaglobulinemia (CMS-HCC)    Anemia    Need for prophylactic immunotherapy      has a past surgical history that includes Colonoscopy (2021); Esophagogastroduodenoscopy; Inguinal hernia repair; Cervical spine surgery (2012); Coronary angioplasty with stent (2014); Cataract extraction, bilateral; pr upper gi endoscopy,w/dir submuc inj (N/A, 08/10/2022); pr upper gi endoscopy,biopsy (N/A, 08/10/2022); and pr lap,surg,colectomy,w/remvl term ileum (N/A, 09/10/2022).    ALLERGIES:  has No Known Allergies.      MEDICATIONS: has a current medication list which includes the following prescription(s): acalabrutinib, albuterol 2.5 mg /3 mL (0.083 %) Nebu 3 mL, albuterol 5 mg/mL Nebu 0.5 mL, atorvastatin, bumetanide, cholecalciferol (vitamin d3-125 mcg (5,000 unit)), clopidogrel, denta 5000 plus, doxazosin, empagliflozin, famotidine, folic acid, glipizide, glipizide, glucosamine-chondroitin, hydralazine, ipratropium-albuterol, victoza 2-pak, metoprolol succinate, dulera, potassium chloride, simethicone, vit c/e/zn/coppr/lutein/zeaxan, zafirlukast, and sucralfate.    FAMILY HISTORY / SOCIAL HISTORY:  Reviewed and updated as appropriate.    REVIEW OF SYSTEMS:  As per interval history.  All other systems reviewed and negative.    PHYSICAL EXAM    Vitals: BP 145/67  - Pulse 68  - Temp 37.1 ??C (98.7 ??F) (Temporal)  - Resp 16  - Ht 165.1 cm (5' 5)  - Wt 63.8 kg (140 lb 11.2 oz)  - SpO2 99%  - BMI 23.41 kg/m??   General:  Pleasant, no acute distress.    Pain:  Pain Evaluation:                                   Pain Score (0 - 1):                                Pain Location:  Eyes:  Appears normal.   HENT:  Atraumatic.   Neck:  thyroid midline.  Lymphatics: Bilateral small cervical lymph nodes.  No supraclavicular axillary or inguinal lymphadenopathy   Cardiovascular:   RRR, trace edema b/l ankles.   Respiratory:  Unlabored.  Good air entry b/l. No wheezing.  CTABL   Gastrointestinal: Does not appear distended.  Nontender. No palpable HSM.   Skin:  No rashes or subcutaneous nodules.    Musculoskeletal:  Gait is steady     Psychiatric:  Affect appropriate. Judgment and insight nl.  Neurologic:  Alert and oriented x3. Grossly non-focal.              RECENT LABS/PATHOLOGY:  Results for orders placed or performed in visit on 10/01/22   Comprehensive Metabolic Panel   Result Value Ref Range    Sodium 146 (H) 135 - 145 mmol/L    Potassium 3.5 3.5 - 5.0 mmol/L    Chloride 107 98 - 107 mmol/L    CO2 31.4 21.0 - 32.0 mmol/L    Anion Gap 8 3 - 11 mmol/L    BUN 33 (H) 8 - 20 mg/dL    Creatinine 0.98 (H) 0.80 - 1.30 mg/dL    BUN/Creatinine Ratio 22     eGFR CKD-EPI (2021) Male 46 (L) >=60 mL/min/1.24m2    Glucose 214 (H) 70 - 179 mg/dL    Calcium 8.9 8.5 - 11.9 mg/dL    Albumin 3.0 (L) 3.5 - 5.0 g/dL    Total Protein 5.7 (L) 6.0 - 8.0 g/dL    Total Bilirubin 0.3 0.2 - 1.0 mg/dL    AST 15 15 - 40 U/L    ALT 22 12 - 78 U/L    Alkaline Phosphatase 131 (H) 46 - 116 U/L   CEA   Result Value Ref Range    CEA 1.3 0.0 - 5.0 ng/mL   Iron Level and TIBC   Result Value Ref Range    Iron 22 (L) 50 - 150 ug/dL    TIBC 147 829 - 562 ug/dL    Iron Saturation (%) 9 (L) 20 - 55 %   Ferritin   Result Value Ref Range    Ferritin 116.0 10.5 - 307.3 ng/mL   CBC w/ Differential   Result Value Ref Range    WBC 7.6 3.6 - 11.2 10*9/L    RBC 4.16 (L) 4.26 - 5.60 10*12/L    HGB 10.5 (L) 12.9 - 16.5 g/dL    HCT 13.0 (L) 86.5 - 48.0 %    MCV 81.0 77.6 - 95.7 fL    MCH 25.3 (L) 25.9 - 32.4 pg    MCHC 31.2 (L) 32.0 - 36.0 g/dL    RDW 78.4 (H) 69.6 - 15.2 %    MPV 7.3 6.8 - 10.7 fL    Platelet 165 150 - 450 10*9/L    Neutrophils % 54.5 %    Lymphocytes % 35.7 %    Monocytes % 8.1 %    Eosinophils % 1.0 %    Basophils % 0.7 %    Absolute Neutrophils 4.2 1.8 - 7.8 10*9/L    Absolute Lymphocytes 2.7 1.1 - 3.6 10*9/L    Absolute Monocytes 0.6 0.3 - 0.8 10*9/L    Absolute Eosinophils 0.1 0.0 - 0.5 10*9/L    Absolute Basophils 0.1 0.0 - 0.1 10*9/L    Anisocytosis Moderate (A) Not Present    Hypochromasia Slight (A) Not Present     RADIOLOGY    08/13/22  CT chest  1.  No evidence of thoracic metastatic disease.   2.  Decreased mediastinal and hilar lymphadenopathy, compatible with improved CLL.         08/13/2022 CT ap   Impression   1.  Mass-like thickening of the transverse colon, compatible with adenocarcinoma.   2.  Few hepatic lesions are likely benign but incompletely evaluated. Recommend MRI.   3.  Resolution of lymphadenopathy and splenomegaly, compatible with improved CLL.       ASSESSMENT and PLAN:    # Chronic lymphocytic leukemia  --Rai stage IV CLL  --CLL FISH panel shows 13 q. deletion 21.5%, IGHV mutated -good prognostic features  --11/05/2020: TP53 mutation negative.  NOTCH1 negative.  SF3B1 positive.  SF3B1 positivity indicates poor prognosis.  -- no evidence of hemolysis.   --Indication for treatment:  advanced stage and history of thrombocytopenia  -- He has been treated with Rituxan many years ago by Dr Thedore Mins, with good response in the 1990s. Treatment indication at that time may have been frequent infections.  -- started acabrutinib 12/19/20 - held 1 week prior to surgery.   -- reviewed CBC, CMP today. Plt norml. ALC normal.       PLAN:  -- Continue to HOLD  Acalbrutinib 100 mg BID. For 1 week . Okay to restart on 2/10  -- Due for IVIG now  - will request IVIG administration         #Stage II Colon adenocarcinoma  - mucinous features[pT4N0M0]  - recent colonoscopy for evaluation of iron deficiency anemia revealed a transverse colon lesion consistent with adenocarcinoma --MMR deficient, MLH1 and PMS2 absent expression.  BRAF negative  MLH1 hypermethylation + c/with sporadic MMR-deficient colon adenoCA  -- Staging CT chest without contrast and CT abdomen and pelvis with contrast scheduled for today afternoon  -- 09/10/21 Partial colectomy   -- reviewed pathology in detail and discussed [facility] colon cancer nomogram which shows a >90 % 3 year and 5 year PFS   -- I explained the inherent resistance to 41fu with MMR-deficient colon cancers and I would not recommend adjuvant FOLFOX for him given his age ,and comorbidities. His PS is recovering well after surgery however he is still quite frail and has a PS of 3 - resting for most of the day.    -- we discussed ctDNA testing and implications of this. He is interested in this.     PLAN:  Ordered MRI abdomen with/without contrast due to small indeterminate  likely benign appearing liver hypodenisities.   Ordered [facility] ctDNA testing today.   If ctDNA + I will present him at tumor board to discuss whether to consider adjuvant chemotherapy   If ctDNA negative I will advise against adjuvant chemotherapy and continue with surveillance       # Patchy lung opacities  -- most recent CT chest from 05/16/2021 mixed response with regardin the GGOs.  He continues to have no infectious symptoms.  Patient is we would expect after his COVID diagnosis.  He followed up with Dr. Virgia Land recently.  Findings on his CT chest are felt to represent volume overload as noted by a recent elevation in proBNP on 06/02/2021.  No pulmonary intervention was recommended by Dr. Virgia Land at this time.  --follow up CT chest 08/13/21 showed overall improved aeration. Small indeterminate micronodules at stbale and per radiology require 6-12 month follow up .   -- he will continue following with Dr Ace Gins.   -- CT chest scheduled today.  Ordered  CT chest without contrast to limit contrast load.          #Iron deficiency anemia  -- He was noted to have iron deficiency in 2014 and has since received IV iron in the past and has been on oral iron 1 pill/day.   -- iron deficiency anemia/ACD in 07/2019 with hb down to 8.7 and ferritin 78, iron sat 12%  -- now s/p 1020 mg ferheme 07/2019 and early 10/2018 with hb improvement to 10.9 today.   --EGD and colonoscopy with Dr. Loreta Ave 01/2020 revealed bleeding gastric ulcer, smaller antral ulcer with old blood. .  Currently being managed with sucralfate.  -- In June 2023 hemoglobin was down to 8.3.  He was given additional IV iron with Feraheme 510 mg x 2 doses  -- seen by Gi and planned for EGD , pending cardiology clearance  -- s/p feraheme 510 mg x 2 doses 9/28-10/9  --Scheduled for IV iron 08/17/2022 and 08/27/2022  -- today Iron sat 6 , iron low. TIBC normal. Feritin 116. Likely has some underlying iron deficiency anemia  and Anemia of inflammation.     PLAN:  Will order and schedule feraheme 510 mg x 2 doses 1 week apart.         # Hypogammaglobulinemia   --History level continues to be low however he has not had any more infections since we started him on IVIG. Before we started him on IVIG he has had multiple hospitalizations for pneumonias.  --Therefore we will continue IVIG for now every 3 months as he is tolerated this well.           # Health maintenance:  --  Evusheld  #1 12/04/20   -- bivalent booster  06/01/21  -- influenza vaccine 06/01/21  --evusheld  #2 07/01/21          # diastolic CHF with volume overload  -- followed closely by Cardiology.recently switched to bumex.   -- diuresis has bee associated with some degree of renal dysfunction in the last 5-6 months  -- he will continue following closely with Cardiology    # CKD Stage IV  -- Following closely with [facility].  -- Cr stable      DISPO:  RTC in 4 weeks for MD visit     I personally spent 40 minutes face-to-face and non-face-to-face in the care of this patient, which includes all pre, intra, and post visit time on the date of service.        Elyse Hsu, MD  [facility]

## 2022-10-11 ENCOUNTER — Ambulatory Visit: Admit: 2022-10-11 | Discharge: 2022-10-12 | Payer: MEDICARE

## 2022-10-11 DIAGNOSIS — D801 Nonfamilial hypogammaglobulinemia: Principal | ICD-10-CM

## 2022-10-11 DIAGNOSIS — C9110 Chronic lymphocytic leukemia of B-cell type not having achieved remission: Principal | ICD-10-CM

## 2022-10-11 DIAGNOSIS — D509 Iron deficiency anemia, unspecified: Principal | ICD-10-CM

## 2022-10-11 MED ADMIN — sodium chloride (NS) 0.9 % infusion: 20 mL/h | INTRAVENOUS | @ 14:00:00 | Stop: 2022-10-11

## 2022-10-11 MED ADMIN — immun glob G(IgG)-pro-IgA 0-50 (PRIVIGEN) 10 % intravenous solution 25 g: .4 g/kg | INTRAVENOUS | @ 14:00:00 | Stop: 2022-10-11

## 2022-10-11 MED ADMIN — acetaminophen (TYLENOL) tablet 650 mg: 650 mg | ORAL | @ 14:00:00 | Stop: 2022-10-11

## 2022-10-11 MED ADMIN — diphenhydrAMINE (BENADRYL) capsule/tablet 25 mg: 25 mg | ORAL | @ 14:00:00 | Stop: 2022-10-11

## 2022-10-11 NOTE — Unmapped (Signed)
Pt received infusion and monitored per order. SBP was 150s throughout IVIG and then SBP is in the 180s for post-IVIG vitals check. MD notified Jomarie Longs, MD recommended pt go home, take home hydralazine, repeat BP, and if BP still elevated >150, pt should call his PCPs office. This RN educated patient and family member on MD recommendations. Pt confirmed understanding through teach-back method. Follow up appointments reviewed. Discharged stable to home.

## 2022-10-15 ENCOUNTER — Ambulatory Visit: Admit: 2022-10-15 | Discharge: 2022-10-16 | Payer: MEDICARE

## 2022-10-15 DIAGNOSIS — D801 Nonfamilial hypogammaglobulinemia: Principal | ICD-10-CM

## 2022-10-15 DIAGNOSIS — D509 Iron deficiency anemia, unspecified: Principal | ICD-10-CM

## 2022-10-15 DIAGNOSIS — C9110 Chronic lymphocytic leukemia of B-cell type not having achieved remission: Principal | ICD-10-CM

## 2022-10-15 MED ADMIN — diphenhydrAMINE (BENADRYL) capsule/tablet 25 mg: 25 mg | ORAL | @ 13:00:00 | Stop: 2022-10-15

## 2022-10-15 MED ADMIN — acetaminophen (TYLENOL) tablet 650 mg: 650 mg | ORAL | @ 13:00:00 | Stop: 2022-10-15

## 2022-10-15 MED ADMIN — ferumoxytoL (FERAHEME) 510 mg in sodium chloride 0.9 % (NS) 100 mL IVPB-MBP: 510 mg | INTRAVENOUS | @ 14:00:00 | Stop: 2022-10-15

## 2022-10-15 NOTE — Unmapped (Signed)
Infusion completed, tolerated well, VSS.  Patient provided blankets, drinks, and snacks as needed throughout infusion.  Comfort continually assessed by staff.  PIV de-accessed per [facility] protocol. Patient discharged in stable condition, ambulatory.  Return to clinic information provided.

## 2022-10-19 NOTE — Unmapped (Signed)
[facility] Refill Coordination Note    Specialty Medication(s) to be Shipped:   Hematology/Oncology: Calquence 100mg     Other medication(s) to be shipped: No additional medications requested for fill at this time     Ward Comas, DOB: Jun 01, 1935  Phone: [phone] (home)       All above HIPAA information was verified with  patient's daughter      Was a Nurse, learning disability used for this call? No    Completed refill call assessment today to schedule patient's medication shipment from the [facility] [phone]).  All relevant notes have been reviewed.     Specialty medication(s) and dose(s) confirmed: Regimen is correct and unchanged.   Changes to medications: [place] reports no changes at this time.  Changes to insurance: No  New side effects reported not previously addressed with a pharmacist or physician: None reported  Questions for the pharmacist: No    Confirmed patient received a Conservation officer, historic buildings and a Surveyor, mining with first shipment. The patient will receive a drug information handout for each medication shipped and additional FDA Medication Guides as required.       DISEASE/MEDICATION-SPECIFIC INFORMATION        N/A    SPECIALTY MEDICATION ADHERENCE     Medication Adherence    Patient reported X missed doses in the last month: 0  Specialty Medication: CALQUENCE 100 mg tablet (acalabrutinib)  Patient is on additional specialty medications: No  Informant: child/children              Were doses missed due to medication being on hold? No    Calquence 100 mg: 17 days of medicine on hand       REFERRAL TO PHARMACIST     Referral to the pharmacist: Not needed      [facility]     Shipping address confirmed in Epic.     Delivery Scheduled: Yes, Expected medication delivery date: 10/29/22.     Medication will be delivered via Next Day Courier to the prescription address in Epic WAM.    [name]   [facility] Specialty Technician

## 2022-10-22 ENCOUNTER — Ambulatory Visit: Admit: 2022-10-22 | Discharge: 2022-10-23 | Payer: MEDICARE

## 2022-10-22 DIAGNOSIS — D509 Iron deficiency anemia, unspecified: Principal | ICD-10-CM

## 2022-10-22 DIAGNOSIS — C9110 Chronic lymphocytic leukemia of B-cell type not having achieved remission: Principal | ICD-10-CM

## 2022-10-22 DIAGNOSIS — D801 Nonfamilial hypogammaglobulinemia: Principal | ICD-10-CM

## 2022-10-22 MED ADMIN — ferumoxytoL (FERAHEME) 510 mg in sodium chloride 0.9 % (NS) 100 mL IVPB-MBP: 510 mg | INTRAVENOUS | @ 14:00:00 | Stop: 2022-10-22

## 2022-10-22 MED ADMIN — diphenhydrAMINE (BENADRYL) capsule/tablet 25 mg: 25 mg | ORAL | @ 14:00:00 | Stop: 2022-10-22

## 2022-10-22 MED ADMIN — acetaminophen (TYLENOL) tablet 650 mg: 650 mg | ORAL | @ 14:00:00 | Stop: 2022-10-22

## 2022-10-22 NOTE — Unmapped (Signed)
Patient here for feraheme. See MAR for premedications given. Infusion completed, tolerated well, VSS. Patient monitored for 30 minutes after infusioin without sign of adverse effect/drug reaction. Patient provided blankets, drinks, and snacks as needed throughout infusion. Comfort continually assessed by staff. PIV flushd then removed per [place] protocol. Patient discharged in stable condition, ambulatory. Return to clinic information provided.

## 2022-10-29 MED FILL — CALQUENCE (ACALABRUTINIB MALEATE) 100 MG TABLET: ORAL | 30 days supply | Qty: 60 | Fill #2

## 2022-10-31 DIAGNOSIS — D509 Iron deficiency anemia, unspecified: Principal | ICD-10-CM

## 2022-11-01 ENCOUNTER — Ambulatory Visit: Admit: 2022-11-01 | Discharge: 2022-11-02 | Payer: MEDICARE

## 2022-11-01 LAB — COMPREHENSIVE METABOLIC PANEL
ALBUMIN: 3.3 g/dL — ABNORMAL LOW (ref 3.5–5.0)
ALKALINE PHOSPHATASE: 142 U/L — ABNORMAL HIGH (ref 46–116)
ALT (SGPT): 25 U/L (ref 12–78)
ANION GAP: 9 mmol/L (ref 3–11)
AST (SGOT): 14 U/L — ABNORMAL LOW (ref 15–40)
BILIRUBIN TOTAL: 0.3 mg/dL (ref 0.2–1.0)
BLOOD UREA NITROGEN: 40 mg/dL — ABNORMAL HIGH (ref 8–20)
BUN / CREAT RATIO: 25
CALCIUM: 8.8 mg/dL (ref 8.5–10.1)
CHLORIDE: 102 mmol/L (ref 98–107)
CO2: 31.7 mmol/L (ref 21.0–32.0)
CREATININE: 1.57 mg/dL — ABNORMAL HIGH (ref 0.80–1.30)
EGFR CKD-EPI (2021) MALE: 43 mL/min/{1.73_m2} — ABNORMAL LOW (ref >=60)
GLUCOSE RANDOM: 254 mg/dL — ABNORMAL HIGH (ref 70–179)
POTASSIUM: 3.8 mmol/L (ref 3.5–5.0)
PROTEIN TOTAL: 5.8 g/dL — ABNORMAL LOW (ref 6.0–8.0)
SODIUM: 143 mmol/L (ref 135–145)

## 2022-11-01 LAB — CBC W/ AUTO DIFF
BASOPHILS ABSOLUTE COUNT: 0 10*9/L (ref 0.0–0.1)
BASOPHILS RELATIVE PERCENT: 0.5 %
EOSINOPHILS ABSOLUTE COUNT: 0 10*9/L (ref 0.0–0.5)
EOSINOPHILS RELATIVE PERCENT: 0.4 %
HEMATOCRIT: 36.5 % — ABNORMAL LOW (ref 39.0–48.0)
HEMOGLOBIN: 11.4 g/dL — ABNORMAL LOW (ref 12.9–16.5)
LYMPHOCYTES ABSOLUTE COUNT: 2.9 10*9/L (ref 1.1–3.6)
LYMPHOCYTES RELATIVE PERCENT: 44.5 %
MEAN CORPUSCULAR HEMOGLOBIN CONC: 31.3 g/dL — ABNORMAL LOW (ref 32.0–36.0)
MEAN CORPUSCULAR HEMOGLOBIN: 25.3 pg — ABNORMAL LOW (ref 25.9–32.4)
MEAN CORPUSCULAR VOLUME: 80.7 fL (ref 77.6–95.7)
MEAN PLATELET VOLUME: 8.4 fL (ref 6.8–10.7)
MONOCYTES ABSOLUTE COUNT: 0.6 10*9/L (ref 0.3–0.8)
MONOCYTES RELATIVE PERCENT: 9.2 %
NEUTROPHILS ABSOLUTE COUNT: 3 10*9/L (ref 1.8–7.8)
NEUTROPHILS RELATIVE PERCENT: 45.4 %
PLATELET COUNT: 90 10*9/L — ABNORMAL LOW (ref 150–450)
RED BLOOD CELL COUNT: 4.52 10*12/L (ref 4.26–5.60)
RED CELL DISTRIBUTION WIDTH: 20.8 % — ABNORMAL HIGH (ref 12.2–15.2)
WBC ADJUSTED: 6.5 10*9/L (ref 3.6–11.2)

## 2022-11-01 LAB — LACTATE DEHYDROGENASE: LACTATE DEHYDROGENASE: 135 U/L (ref 81–234)

## 2022-11-01 NOTE — Unmapped (Signed)
Latest Reference Range & Units 11/01/22 11:42   WBC 3.6 - 11.2 10*9/L 6.5   RBC 4.26 - 5.60 10*12/L 4.52   HGB 12.9 - 16.5 g/dL 16.1 (L)   HCT 09.6 - 04.5 % 36.5 (L)   MCV 77.6 - 95.7 fL 80.7   MCH 25.9 - 32.4 pg 25.3 (L)   MCHC 32.0 - 36.0 g/dL 40.9 (L)   RDW 81.1 - 91.4 % 20.8 (H)   MPV 6.8 - 10.7 fL 8.4   Platelet 150 - 450 10*9/L 90 (L)   Neutrophils % % 45.4   Lymphocytes % % 44.5   Monocytes % % 9.2   Eosinophils % % 0.4   Basophils % % 0.5   Absolute Neutrophils 1.8 - 7.8 10*9/L 3.0   Absolute Lymphocytes 1.1 - 3.6 10*9/L 2.9   Absolute Monocytes  0.3 - 0.8 10*9/L 0.6   Absolute Eosinophils 0.0 - 0.5 10*9/L 0.0   Absolute Basophils  0.0 - 0.1 10*9/L 0.0   Anisocytosis Not Present  Marked !   Hypochromasia Not Present  Slight !   Sodium 135 - 145 mmol/L 143   Potassium 3.5 - 5.0 mmol/L 3.8   Chloride 98 - 107 mmol/L 102   CO2 21.0 - 32.0 mmol/L 31.7   Bun 8 - 20 mg/dL 40 (H)   Creatinine 7.82 - 1.30 mg/dL 9.56 (H)   BUN/Creatinine Ratio  25   eGFR CKD-EPI (2021) Male >=60 mL/min/1.35m2 43 (L)   Anion Gap 3 - 11 mmol/L 9   Glucose 70 - 179 mg/dL 213 (H)   Calcium 8.5 - 10.1 mg/dL 8.8   Albumin 3.5 - 5.0 g/dL 3.3 (L)   Total Protein 6.0 - 8.0 g/dL 5.8 (L)   Total Bilirubin 0.2 - 1.0 mg/dL 0.3   SGOT (AST) 15 - 40 U/L 14 (L)   ALT 12 - 78 U/L 25   Alkaline Phosphatase 46 - 116 U/L 142 (H)   LDH 81 - 234 U/L 135   (L): Data is abnormally low  (H): Data is abnormally high  !: Data is abnormal

## 2022-11-01 NOTE — Unmapped (Signed)
HEMATOLOGY ONCOLOGY CLINIC:  INTERVAL FOLLOW UP  NOTE    PCP:  Arturo Morton, MD    DATE OF ENCOUNTER:  11/01/2022    INTERVAL HISTORY:  Christian Abbott is an 88 y.o. male who comes today for continued follow-up of chronic lymphocytic leukemia.  After recent colonoscopy showing a transverse colon  adenocarcinoma he underwent a right and transverse colectomy on 09/10/22 with Dr Elenore Rota. Healing well post op. Family reports that he appears more energetic/up beat.  Now 7 weeks post op.  Reports that he appetite has improved.  He has restarted calquence on 10/09/22.  Currently feeling well. Reports energy level increased. Able to be more active around the house. ECOG PS 2.       ONCOLOGY HISTORY  Hematology/Oncology History Overview Note   88 y/o male with long-standing history of chronic lymphocytic leukemia initially diagnosed in 1998  Status post Rituxan based therapy for 8 weeks during that time  History of anemia treated with Procrit off and on  History of hypertension diabetes asthma hypercholesterolemia.  History of hospitalization in January 2014 for bilateral pneumonia [facility] for 3 weeks.     CLL (chronic lymphocytic leukemia) (CMS-HCC)   07/18/1997 Initial Diagnosis    CLL (chronic lymphocytic leukemia)           PAST MEDICAL HISTORY:     Patient Active Problem List   Diagnosis    CLL (chronic lymphocytic leukemia) (CMS-HCC)    Iron deficiency anemia    Hypogammaglobulinemia (CMS-HCC)    Anemia    Need for prophylactic immunotherapy      has a past surgical history that includes Colonoscopy (2021); Esophagogastroduodenoscopy; Inguinal hernia repair; Cervical spine surgery (2012); Coronary angioplasty with stent (2014); Cataract extraction, bilateral; pr upper gi endoscopy,w/dir submuc inj (N/A, 08/10/2022); pr upper gi endoscopy,biopsy (N/A, 08/10/2022); and pr lap,surg,colectomy,w/remvl term ileum (N/A, 09/10/2022).    ALLERGIES:  has No Known Allergies.      MEDICATIONS: has a current medication list which includes the following prescription(s): acalabrutinib, albuterol 2.5 mg /3 mL (0.083 %) Nebu 3 mL, albuterol 5 mg/mL Nebu 0.5 mL, atorvastatin, cholecalciferol (vitamin d3-125 mcg (5,000 unit)), denta 5000 plus, doxazosin, empagliflozin, famotidine, folic acid, glipizide, glipizide, glucosamine-chondroitin, hydralazine, ipratropium-albuterol, victoza 2-pak, metoprolol succinate, dulera, potassium chloride, sucralfate, vit c/e/zn/coppr/lutein/zeaxan, zafirlukast, bumetanide, and clopidogrel.    FAMILY HISTORY / SOCIAL HISTORY:  Reviewed and updated as appropriate.    REVIEW OF SYSTEMS:  As per interval history.  All other systems reviewed and negative.    PHYSICAL EXAM    Vitals: BP 142/64  - Pulse 63  - Temp 36.8 ??C (98.2 ??F) (Temporal)  - Resp 16  - Ht 165.1 cm (5' 5)  - Wt 63.6 kg (140 lb 3.2 oz)  - SpO2 98%  - BMI 23.33 kg/m??   General:  Pleasant, no acute distress.    Pain:  Pain Evaluation:                                   Pain Score (0 - 1):                                Pain Location:  Eyes:  Appears normal.   HENT:  Atraumatic.   Neck:  thyroid midline.  Lymphatics: Bilateral small cervical lymph nodes.  No supraclavicular axillary or inguinal lymphadenopathy   Cardiovascular:   RRR, trace edema b/l ankles.   Respiratory:  Unlabored.  Good air entry b/l. No wheezing.  CTABL   Gastrointestinal: Does not appear distended.  Nontender. No palpable HSM.   Skin:  No rashes or subcutaneous nodules.    Musculoskeletal:  Gait is steady     Psychiatric:  Affect appropriate. Judgment and insight nl.  Neurologic:  Alert and oriented x3. Grossly non-focal.              RECENT LABS/PATHOLOGY:  Results for orders placed or performed in visit on 11/01/22   Comprehensive Metabolic Panel   Result Value Ref Range    Sodium 143 135 - 145 mmol/L    Potassium 3.8 3.5 - 5.0 mmol/L    Chloride 102 98 - 107 mmol/L    CO2 31.7 21.0 - 32.0 mmol/L    Anion Gap 9 3 - 11 mmol/L    BUN 40 (H) 8 - 20 mg/dL    Creatinine 2.13 (H) 0.80 - 1.30 mg/dL    BUN/Creatinine Ratio 25     eGFR CKD-EPI (2021) Male 43 (L) >=60 mL/min/1.19m2    Glucose 254 (H) 70 - 179 mg/dL    Calcium 8.8 8.5 - 08.6 mg/dL    Albumin 3.3 (L) 3.5 - 5.0 g/dL    Total Protein 5.8 (L) 6.0 - 8.0 g/dL    Total Bilirubin 0.3 0.2 - 1.0 mg/dL    AST 14 (L) 15 - 40 U/L    ALT 25 12 - 78 U/L    Alkaline Phosphatase 142 (H) 46 - 116 U/L   Lactate dehydrogenase   Result Value Ref Range    LDH 135 81 - 234 U/L   CBC w/ Differential   Result Value Ref Range    WBC 6.5 3.6 - 11.2 10*9/L    RBC 4.52 4.26 - 5.60 10*12/L    HGB 11.4 (L) 12.9 - 16.5 g/dL    HCT 57.8 (L) 46.9 - 48.0 %    MCV 80.7 77.6 - 95.7 fL    MCH 25.3 (L) 25.9 - 32.4 pg    MCHC 31.3 (L) 32.0 - 36.0 g/dL    RDW 62.9 (H) 52.8 - 15.2 %    MPV 8.4 6.8 - 10.7 fL    Platelet 90 (L) 150 - 450 10*9/L    Neutrophils % 45.4 %    Lymphocytes % 44.5 %    Monocytes % 9.2 %    Eosinophils % 0.4 %    Basophils % 0.5 %    Absolute Neutrophils 3.0 1.8 - 7.8 10*9/L    Absolute Lymphocytes 2.9 1.1 - 3.6 10*9/L    Absolute Monocytes 0.6 0.3 - 0.8 10*9/L    Absolute Eosinophils 0.0 0.0 - 0.5 10*9/L    Absolute Basophils 0.0 0.0 - 0.1 10*9/L    Anisocytosis Marked (A) Not Present    Hypochromasia Slight (A) Not Present     RADIOLOGY    08/13/22 CT chest  1.  No evidence of thoracic metastatic disease.   2.  Decreased mediastinal and hilar lymphadenopathy, compatible with improved CLL.         08/13/2022 CT ap   Impression   1.  Mass-like thickening of the transverse colon, compatible with adenocarcinoma.   2.  Few hepatic lesions are likely  benign but incompletely evaluated. Recommend MRI.   3.  Resolution of lymphadenopathy and splenomegaly, compatible with improved CLL.       ASSESSMENT and PLAN:    # Chronic lymphocytic leukemia  --Rai stage IV CLL  --CLL FISH panel shows 13 q. deletion 21.5%, IGHV mutated -good prognostic features  --11/05/2020: TP53 mutation negative.  NOTCH1 negative.  SF3B1 positive.  SF3B1 positivity indicates poor prognosis.  -- no evidence of hemolysis.   --Indication for treatment:  advanced stage and history of thrombocytopenia  -- He has been treated with Rituxan many years ago by Dr Thedore Mins, with good response in the 1990s. Treatment indication at that time may have been frequent infections.  -- started acabrutinib 12/19/20 - held 1 week prior to surgery.   -- reviewed CBC, CMP today. Plt norml. ALC normal.       PLAN:  -- Continue to HOLD  Acalbrutinib 100 mg BID. For 1 week . Okay to restart on 2/10  -- Due for IVIG now  - will request IVIG administration         #Stage II Colon adenocarcinoma  - mucinous features[[id number]]  - recent colonoscopy for evaluation of iron deficiency anemia revealed a transverse colon lesion consistent with adenocarcinoma --MMR deficient, MLH1 and PMS2 absent expression.  BRAF negative  MLH1 hypermethylation + c/with sporadic MMR-deficient colon adenoCA  -- Staging CT chest without contrast and CT abdomen and pelvis with contrast scheduled for today afternoon  -- 09/10/21 Partial colectomy   -- reviewed pathology in detail and discussed [facility] colon cancer nomogram which shows a >90 % 3 year and 5 year PFS   -- I explained the inherent resistance to 25fu with MMR-deficient colon cancers and I would not recommend adjuvant FOLFOX for him given his age ,and comorbidities. His PS is recovering well after surgery however he is still quite frail. PS 2.   -- we reviewed ctDNA results 10/01/22 low positive 0.02-->10/19/22  negative (0)  -- Although initial tet was positive - this test was very low positive and ~ 3w later on 2/20 repeat testing was negative. This suggests low risk of distant metastasis.   -- given age, comorbidites would recommend against Adjuvant FOLFOX.   -- I reviewed MRI abdomen - due to secondary hemachromatosis nonspecific liver lesions remained indeterminate.       PLAN:  Avoid adjuvant chemotherapy (10/19/22 ctDNA negative)  Continued surveillance. Next ctDNA testing in April.   Will repeat CT c/a/p in 6 months.      # Patchy lung opacities  -- most recent CT chest from 05/16/2021 mixed response with regardin the GGOs.  He continues to have no infectious symptoms.  Patient is we would expect after his COVID diagnosis.  He followed up with Dr. Virgia Land recently.  Findings on his CT chest are felt to represent volume overload as noted by a recent elevation in proBNP on 06/02/2021.  No pulmonary intervention was recommended by Dr. Virgia Land at this time.  --follow up CT chest 08/13/21 showed overall improved aeration. Small indeterminate micronodules at stbale and per radiology require 6-12 month follow up .   -- he will continue following with Dr Ace Gins.   -- CT chest scheduled today.  Ordered CT chest without contrast to limit contrast load.          #Iron deficiency anemia  -- He was noted to have iron deficiency in 2014 and has since received IV iron in the past and has been on oral iron 1  pill/day.   -- iron deficiency anemia/ACD in 07/2019 with hb down to 8.7 and ferritin 78, iron sat 12%  -- now s/p 1020 mg ferheme 07/2019 and early 10/2018 with hb improvement to 10.9 today.   --EGD and colonoscopy with Dr. Loreta Ave 01/2020 revealed bleeding gastric ulcer, smaller antral ulcer with old blood. .  Currently being managed with sucralfate.  -- In June 2023 hemoglobin was down to 8.3.  He was given additional IV iron with Feraheme 510 mg x 2 doses  -- seen by Gi and planned for EGD , pending cardiology clearance  -- s/p feraheme 510 mg x 2 doses 9/28-10/9  --Scheduled for IV iron 08/17/2022 and 08/27/2022  -- CTM         # Hypogammaglobulinemia   --History level continues to be low however he has not had any more infections since we started him on IVIG. Before we started him on IVIG he has had multiple hospitalizations for pneumonias.  --Therefore we will continue IVIG for now every 3 months as he is tolerated this well.  -- Last dose 06/25/22.   -- will schedule soon.          # Health maintenance:  --  Evusheld  #1 12/04/20   -- bivalent booster  06/01/21  -- influenza vaccine 06/01/21  --evusheld  #2 07/01/21          # diastolic CHF with volume overload  -- followed closely by Cardiology.recently switched to bumex.   -- diuresis has bee associated with some degree of renal dysfunction in the last 5-6 months  -- he will continue following closely with Cardiology    # CKD Stage IV  -- Following closely with [facility].  -- Cr stable      DISPO:  RTC in 4 weeks for MD visit     I personally spent 40 minutes face-to-face and non-face-to-face in the care of this patient, which includes all pre, intra, and post visit time on the date of service.        Elyse Hsu, MD  [facility]

## 2022-11-03 MED ORDER — SUCRALFATE 100 MG/ML ORAL SUSPENSION
5 refills | 0 days
Start: 2022-11-03 — End: ?

## 2022-11-03 NOTE — Unmapped (Signed)
Refill request for Carafate 100 mg/ml from [facility]. Rx sent on 10/01/22 with 4 refills.  Refused and routed.

## 2022-11-04 LAB — IGG: GAMMAGLOBULIN; IGG: 376 mg/dL — ABNORMAL LOW (ref 646–2013)

## 2022-11-19 NOTE — Unmapped (Signed)
Patient's daughter denied refills for Calquence at this time, patient has over two weeks of medication on hand. Will reschedule refill call for two weeks.

## 2022-11-22 MED ORDER — SUCRALFATE 100 MG/ML ORAL SUSPENSION
5 refills | 0 days
Start: 2022-11-22 — End: ?

## 2022-11-22 NOTE — Unmapped (Signed)
Ordered on 10/01/22 with 4 refills.

## 2022-12-03 NOTE — Unmapped (Signed)
[facility] Refill Coordination Note    Specialty Medication(s) to be Shipped:   Hematology/Oncology: Calquence 100mg     Other medication(s) to be shipped: No additional medications requested for fill at this time     Christian Abbott, DOB: 14-May-1935  Phone: [phone] (home)       All above HIPAA information was verified with  patient's daughter      Was a Nurse, learning disability used for this call? No    Completed refill call assessment today to schedule patient's medication shipment from the [facility] [phone]).  All relevant notes have been reviewed.     Specialty medication(s) and dose(s) confirmed: Regimen is correct and unchanged.   Changes to medications: Christian Abbott reports no changes at this time.  Changes to insurance: No  New side effects reported not previously addressed with a pharmacist or physician: None reported  Questions for the pharmacist: No    Confirmed patient received a Conservation officer, historic buildings and a Surveyor, mining with first shipment. The patient will receive a drug information handout for each medication shipped and additional FDA Medication Guides as required.       DISEASE/MEDICATION-SPECIFIC INFORMATION        N/A    SPECIALTY MEDICATION ADHERENCE     Medication Adherence    Patient reported X missed doses in the last month: 1  Specialty Medication: CALQUENCE 100 mg  Patient is on additional specialty medications: No  Patient is on more than two specialty medications: No  Any gaps in refill history greater than 2 weeks in the last 3 months: no  Demonstrates understanding of importance of adherence: yes              Were doses missed due to medication being on hold? No    Calquence 100 mg: 10 days of medicine on hand       REFERRAL TO PHARMACIST     Referral to the pharmacist: Not needed      [facility]     Shipping address confirmed in Epic.     Delivery Scheduled: Yes, Expected medication delivery date: 12/08/22.     Medication will be delivered via Next Day Courier to the prescription address in Epic WAM.    Christian Abbott   [facility] Specialty Technician

## 2022-12-07 MED FILL — CALQUENCE (ACALABRUTINIB MALEATE) 100 MG TABLET: ORAL | 30 days supply | Qty: 60 | Fill #3

## 2022-12-08 DIAGNOSIS — C189 Malignant neoplasm of colon, unspecified: Principal | ICD-10-CM

## 2022-12-09 ENCOUNTER — Ambulatory Visit: Admit: 2022-12-09 | Discharge: 2022-12-10 | Payer: MEDICARE

## 2022-12-09 LAB — CBC W/ AUTO DIFF
BASOPHILS ABSOLUTE COUNT: 0 10*9/L (ref 0.0–0.1)
BASOPHILS RELATIVE PERCENT: 0.2 %
EOSINOPHILS ABSOLUTE COUNT: 0 10*9/L (ref 0.0–0.5)
EOSINOPHILS RELATIVE PERCENT: 0.2 %
HEMATOCRIT: 36.2 % — ABNORMAL LOW (ref 39.0–48.0)
HEMOGLOBIN: 11.3 g/dL — ABNORMAL LOW (ref 12.9–16.5)
LYMPHOCYTES ABSOLUTE COUNT: 2.4 10*9/L (ref 1.1–3.6)
LYMPHOCYTES RELATIVE PERCENT: 21.4 %
MEAN CORPUSCULAR HEMOGLOBIN CONC: 31.2 g/dL — ABNORMAL LOW (ref 32.0–36.0)
MEAN CORPUSCULAR HEMOGLOBIN: 26.1 pg (ref 25.9–32.4)
MEAN CORPUSCULAR VOLUME: 83.8 fL (ref 77.6–95.7)
MEAN PLATELET VOLUME: 8.4 fL (ref 6.8–10.7)
MONOCYTES ABSOLUTE COUNT: 0.5 10*9/L (ref 0.3–0.8)
MONOCYTES RELATIVE PERCENT: 4.2 %
NEUTROPHILS ABSOLUTE COUNT: 8.4 10*9/L — ABNORMAL HIGH (ref 1.8–7.8)
NEUTROPHILS RELATIVE PERCENT: 74 %
PLATELET COUNT: 110 10*9/L — ABNORMAL LOW (ref 150–450)
RED BLOOD CELL COUNT: 4.32 10*12/L (ref 4.26–5.60)
RED CELL DISTRIBUTION WIDTH: 19.6 % — ABNORMAL HIGH (ref 12.2–15.2)
WBC ADJUSTED: 11.4 10*9/L — ABNORMAL HIGH (ref 3.6–11.2)

## 2022-12-09 LAB — COMPREHENSIVE METABOLIC PANEL
ALBUMIN: 2.7 g/dL — ABNORMAL LOW (ref 3.5–5.0)
ALKALINE PHOSPHATASE: 131 U/L — ABNORMAL HIGH (ref 46–116)
ALT (SGPT): 29 U/L (ref 12–78)
ANION GAP: 3 mmol/L (ref 3–11)
AST (SGOT): 19 U/L (ref 15–40)
BILIRUBIN TOTAL: 0.4 mg/dL (ref 0.2–1.0)
BLOOD UREA NITROGEN: 38 mg/dL — ABNORMAL HIGH (ref 8–20)
BUN / CREAT RATIO: 27
CALCIUM: 8.8 mg/dL (ref 8.5–10.1)
CHLORIDE: 106 mmol/L (ref 98–107)
CO2: 30.6 mmol/L (ref 21.0–32.0)
CREATININE: 1.4 mg/dL — ABNORMAL HIGH (ref 0.80–1.30)
EGFR CKD-EPI (2021) MALE: 49 mL/min/{1.73_m2} — ABNORMAL LOW (ref >=60)
GLUCOSE RANDOM: 187 mg/dL — ABNORMAL HIGH (ref 70–179)
POTASSIUM: 4.4 mmol/L (ref 3.5–5.0)
PROTEIN TOTAL: 5.2 g/dL — ABNORMAL LOW (ref 6.0–8.0)
SODIUM: 140 mmol/L (ref 135–145)

## 2022-12-09 LAB — LACTATE DEHYDROGENASE: LACTATE DEHYDROGENASE: 180 U/L (ref 81–234)

## 2022-12-09 NOTE — Unmapped (Unsigned)
HEMATOLOGY ONCOLOGY CLINIC:  INTERVAL FOLLOW UP  NOTE    PCP:  Arturo Morton, MD    DATE OF ENCOUNTER:  12/09/2022    INTERVAL HISTORY:  Christian Abbott is an 88 y.o. male who comes today for continued follow-up of chronic lymphocytic leukemia.  After recent colonoscopy showing a transverse colon  adenocarcinoma he underwent a right and transverse colectomy on 09/10/22 with Dr Elenore Rota. Healing well post op. Family reports that he appears more energetic/up beat.  Now 7 weeks post op.  Reports that he appetite has improved.  He has restarted calquence on 10/09/22.  Currently feeling well. Reports energy level increased. Able to be more active around the house. ECOG PS 2.       ONCOLOGY HISTORY  Hematology/Oncology History Overview Note   88 y/o male with long-standing history of chronic lymphocytic leukemia initially diagnosed in 1998  Status post Rituxan based therapy for 8 weeks during that time  History of anemia treated with Procrit off and on  History of hypertension diabetes asthma hypercholesterolemia.  History of hospitalization in January 2014 for bilateral pneumonia [facility] for 3 weeks.     CLL (chronic lymphocytic leukemia) (CMS-HCC)   07/18/1997 Initial Diagnosis    CLL (chronic lymphocytic leukemia)     Colon adenocarcinoma (CMS-HCC)   09/10/2022 -  Cancer Staged    Staging form: Colon and Rectum, AJCC 8th Edition  - Pathologic stage from 09/10/2022: pT4, pN0, cM0 - Signed by Elyse Hsu, MD on 11/03/2022       11/03/2022 Initial Diagnosis    Colon adenocarcinoma (CMS-HCC)           PAST MEDICAL HISTORY:     Patient Active Problem List   Diagnosis    CLL (chronic lymphocytic leukemia) (CMS-HCC)    Iron deficiency anemia    Hypogammaglobulinemia (CMS-HCC)    Anemia    Need for prophylactic immunotherapy    Colon adenocarcinoma (CMS-HCC)      has a past surgical history that includes Colonoscopy (2021); Esophagogastroduodenoscopy; Inguinal hernia repair; Cervical spine surgery (2012); Coronary angioplasty with stent (2014); Cataract extraction, bilateral; pr upper gi endoscopy,w/dir submuc inj (N/A, 08/10/2022); pr upper gi endoscopy,biopsy (N/A, 08/10/2022); and pr lap,surg,colectomy,w/remvl term ileum (N/A, 09/10/2022).    ALLERGIES:  has No Known Allergies.      MEDICATIONS: has a current medication list which includes the following prescription(s): acalabrutinib, albuterol 2.5 mg /3 mL (0.083 %) Nebu 3 mL, albuterol 5 mg/mL Nebu 0.5 mL, amoxicillin-clavulanate, atorvastatin, cholecalciferol (vitamin d3-125 mcg (5,000 unit)), denta 5000 plus, doxazosin, empagliflozin, famotidine, folic acid, glipizide, glipizide, glucosamine-chondroitin, hydralazine, ipratropium-albuterol, victoza 2-pak, metoprolol succinate, dulera, potassium chloride, prednisone, sucralfate, vit c/e/zn/coppr/lutein/zeaxan, zafirlukast, bumetanide, and clopidogrel.    FAMILY HISTORY / SOCIAL HISTORY:  Reviewed and updated as appropriate.    REVIEW OF SYSTEMS:  As per interval history.  All other systems reviewed and negative.    PHYSICAL EXAM    Vitals: BP 165/68  - Pulse 65  - Temp 36.3 ??C (97.3 ??F) (Temporal)  - Resp 16  - Ht 165.1 cm (5' 5)  - Wt 63.6 kg (140 lb 3.2 oz)  - SpO2 100%  - BMI 23.33 kg/m??   General:  Pleasant, no acute distress.    Pain:  Pain Evaluation:                                   Pain Score (0 -  1):                                Pain Location:                                       Eyes:  Appears normal.   HENT:  Atraumatic.   Neck:  thyroid midline.  Lymphatics: Bilateral small cervical lymph nodes.  No supraclavicular axillary or inguinal lymphadenopathy   Cardiovascular:   RRR, trace edema b/l ankles.   Respiratory:  Unlabored.  Good air entry b/l. No wheezing.  CTABL   Gastrointestinal: Does not appear distended.  Nontender. No palpable HSM.   Skin:  No rashes or subcutaneous nodules.    Musculoskeletal:  Gait is steady     Psychiatric:  Affect appropriate. Judgment and insight nl.  Neurologic: Alert and oriented x3. Grossly non-focal.              RECENT LABS/PATHOLOGY:  Results for orders placed or performed in visit on 12/09/22   Comprehensive Metabolic Panel   Result Value Ref Range    Sodium 140 135 - 145 mmol/L    Potassium 4.4 3.5 - 5.0 mmol/L    Chloride 106 98 - 107 mmol/L    CO2 30.6 21.0 - 32.0 mmol/L    Anion Gap 3 3 - 11 mmol/L    BUN 38 (H) 8 - 20 mg/dL    Creatinine 1.61 (H) 0.80 - 1.30 mg/dL    BUN/Creatinine Ratio 27     eGFR CKD-EPI (2021) Male 49 (L) >=60 mL/min/1.38m2    Glucose 187 (H) 70 - 179 mg/dL    Calcium 8.8 8.5 - 09.6 mg/dL    Albumin 2.7 (L) 3.5 - 5.0 g/dL    Total Protein 5.2 (L) 6.0 - 8.0 g/dL    Total Bilirubin 0.4 0.2 - 1.0 mg/dL    AST 19 15 - 40 U/L    ALT 29 12 - 78 U/L    Alkaline Phosphatase 131 (H) 46 - 116 U/L   Lactate dehydrogenase   Result Value Ref Range    LDH 180 81 - 234 U/L   CBC w/ Differential   Result Value Ref Range    WBC 11.4 (H) 3.6 - 11.2 10*9/L    RBC 4.32 4.26 - 5.60 10*12/L    HGB 11.3 (L) 12.9 - 16.5 g/dL    HCT 04.5 (L) 40.9 - 48.0 %    MCV 83.8 77.6 - 95.7 fL    MCH 26.1 25.9 - 32.4 pg    MCHC 31.2 (L) 32.0 - 36.0 g/dL    RDW 81.1 (H) 91.4 - 15.2 %    MPV 8.4 6.8 - 10.7 fL    Platelet 110 (L) 150 - 450 10*9/L    Neutrophils % 74.0 %    Lymphocytes % 21.4 %    Monocytes % 4.2 %    Eosinophils % 0.2 %    Basophils % 0.2 %    Absolute Neutrophils 8.4 (H) 1.8 - 7.8 10*9/L    Absolute Lymphocytes 2.4 1.1 - 3.6 10*9/L    Absolute Monocytes 0.5 0.3 - 0.8 10*9/L    Absolute Eosinophils 0.0 0.0 - 0.5 10*9/L    Absolute Basophils 0.0 0.0 - 0.1 10*9/L    Anisocytosis Moderate (A) Not Present     RADIOLOGY  08/13/22 CT chest  1.  No evidence of thoracic metastatic disease.   2.  Decreased mediastinal and hilar lymphadenopathy, compatible with improved CLL.         08/13/2022 CT ap   Impression   1.  Mass-like thickening of the transverse colon, compatible with adenocarcinoma.   2.  Few hepatic lesions are likely benign but incompletely evaluated. Recommend MRI.   3.  Resolution of lymphadenopathy and splenomegaly, compatible with improved CLL.       ASSESSMENT and PLAN:    # Chronic lymphocytic leukemia  --Rai stage IV CLL  --CLL FISH panel shows 13 q. deletion 21.5%, IGHV mutated -good prognostic features  --11/05/2020: TP53 mutation negative.  NOTCH1 negative.  SF3B1 positive.  SF3B1 positivity indicates poor prognosis.  -- no evidence of hemolysis.   --Indication for treatment:  advanced stage and history of thrombocytopenia  -- He has been treated with Rituxan many years ago by Dr Thedore Mins, with good response in the 1990s. Treatment indication at that time may have been frequent infections.  -- started acabrutinib 12/19/20 - held 1 week prior to surgery.   -- reviewed CBC, CMP today. Plt norml. ALC normal.       PLAN:  -- Continue to HOLD  Acalbrutinib 100 mg BID. For 1 week . Okay to restart on 2/10  -- Due for IVIG now  - will request IVIG administration         #Stage II Colon adenocarcinoma  - mucinous features[pT4N0M0]  - recent colonoscopy for evaluation of iron deficiency anemia revealed a transverse colon lesion consistent with adenocarcinoma --MMR deficient, MLH1 and PMS2 absent expression.  BRAF negative  MLH1 hypermethylation + c/with sporadic MMR-deficient colon adenoCA  -- Staging CT chest without contrast and CT abdomen and pelvis with contrast scheduled for today afternoon  -- 09/10/21 Partial colectomy   -- reviewed pathology in detail and discussed [facility] colon cancer nomogram which shows a >90 % 3 year and 5 year PFS   -- I explained the inherent resistance to 103fu with MMR-deficient colon cancers and I would not recommend adjuvant FOLFOX for him given his age ,and comorbidities. His PS is recovering well after surgery however he is still quite frail. PS 2.   -- we reviewed ctDNA results 10/01/22 low positive 0.02-->10/19/22  negative (0)  -- Although initial tet was positive - this test was very low positive and ~ 3w later on 2/20 repeat testing was negative. This suggests low risk of distant metastasis.   -- given age, comorbidites would recommend against Adjuvant FOLFOX.   -- I reviewed MRI abdomen - due to secondary hemachromatosis nonspecific liver lesions remained indeterminate.       PLAN:  Avoid adjuvant chemotherapy (10/19/22 ctDNA negative)  Continued surveillance. Next ctDNA testing in April.   Will repeat CT c/a/p in 6 months.      # Patchy lung opacities  -- most recent CT chest from 05/16/2021 mixed response with regardin the GGOs.  He continues to have no infectious symptoms.  Patient is we would expect after his COVID diagnosis.  He followed up with Dr. Virgia Land recently.  Findings on his CT chest are felt to represent volume overload as noted by a recent elevation in proBNP on 06/02/2021.  No pulmonary intervention was recommended by Dr. Virgia Land at this time.  --follow up CT chest 08/13/21 showed overall improved aeration. Small indeterminate micronodules at stbale and per radiology require 6-12 month follow up .   -- he will  continue following with Dr Ace Gins.   -- CT chest scheduled today.  Ordered CT chest without contrast to limit contrast load.          #Iron deficiency anemia  -- He was noted to have iron deficiency in 2014 and has since received IV iron in the past and has been on oral iron 1 pill/day.   -- iron deficiency anemia/ACD in 07/2019 with hb down to 8.7 and ferritin 78, iron sat 12%  -- now s/p 1020 mg ferheme 07/2019 and early 10/2018 with hb improvement to 10.9 today.   --EGD and colonoscopy with Dr. Loreta Ave 01/2020 revealed bleeding gastric ulcer, smaller antral ulcer with old blood. .  Currently being managed with sucralfate.  -- In June 2023 hemoglobin was down to 8.3.  He was given additional IV iron with Feraheme 510 mg x 2 doses  -- seen by Gi and planned for EGD , pending cardiology clearance  -- s/p feraheme 510 mg x 2 doses 9/28-10/9  --Scheduled for IV iron 08/17/2022 and 08/27/2022  -- CTM         # Hypogammaglobulinemia --History level continues to be low however he has not had any more infections since we started him on IVIG. Before we started him on IVIG he has had multiple hospitalizations for pneumonias.  --Therefore we will continue IVIG for now every 3 months as he is tolerated this well.  -- Last dose 06/25/22.   -- will schedule soon.          # Health maintenance:  --  Evusheld  #1 12/04/20   -- bivalent booster  06/01/21  -- influenza vaccine 06/01/21  --evusheld  #2 07/01/21          # diastolic CHF with volume overload  -- followed closely by Cardiology.recently switched to bumex.   -- diuresis has bee associated with some degree of renal dysfunction in the last 5-6 months  -- he will continue following closely with Cardiology    # CKD Stage IV  -- Following closely with [facility].  -- Cr stable      DISPO:  RTC in 4 weeks for MD visit     I personally spent 40 minutes face-to-face and non-face-to-face in the care of this patient, which includes all pre, intra, and post visit time on the date of service.        Elyse Hsu, MD  [facility]

## 2022-12-29 DIAGNOSIS — D801 Nonfamilial hypogammaglobulinemia: Principal | ICD-10-CM

## 2022-12-29 NOTE — Unmapped (Signed)
[name] Clinical Assessment & Refill Coordination Note    Christian Abbott, DOB: 1935/04/09  Phone: [phone] (home)     All above HIPAA information was verified with patient's family member, daughter.     Was a Nurse, learning disability used for this call? No    Specialty Medication(s):   Hematology/Oncology: Calquence     Current Outpatient Medications   Medication Sig Dispense Refill    acalabrutinib (CALQUENCE) 100 mg tablet Take 1 tablet (100 mg total) by mouth Two (2) times a day. Swallow whole with water. Do not chew, crush, dissolve, or cut tablets. (Patient taking differently: Take 1 tablet (100 mg total) by mouth two (2) times a day. Oncologist recommends to hold for 7 days post op - Dr Jomarie Longs. Daughter has med if needed.) 60 tablet 5    albuterol 2.5 mg /3 mL (0.083 %) Nebu 3 mL, albuterol 5 mg/mL Nebu 0.5 mL Inhale 2.5 mg every six (6) hours.      amoxicillin-clavulanate (AUGMENTIN) 875-125 mg per tablet Take 1 tablet by mouth two (2) times a day.      atorvastatin (LIPITOR) 80 MG tablet Take 1 tablet (80 mg total) by mouth at bedtime.      bumetanide (BUMEX) 1 MG tablet Take 1 tablet (1 mg total) by mouth daily. 30 tablet 0    cholecalciferol, vitamin D3, 5,000 unit Tab Take 1 tablet (125 mcg total) by mouth daily.      clopidogrel (PLAVIX) 75 mg tablet Take 1 tablet (75 mg total) by mouth daily. Resume taking on 09/19/22 30 tablet 0    DENTA 5000 PLUS 1.1 % Crea Apply 1 Application to teeth daily.      doxazosin (CARDURA) 4 MG tablet Take 1 tablet (4 mg total) by mouth nightly.  1    empagliflozin (JARDIANCE) 10 mg tablet Take 1 tablet (10 mg total) by mouth daily.      famotidine (PEPCID) 40 MG tablet Take 1 tablet (40 mg total) by mouth two (2) times a day.      folic acid (FOLVITE) 800 MCG tablet Take 1 tablet (800 mcg total) by mouth daily.      glipiZIDE (GLUCOTROL XL) 2.5 MG 24 hr tablet Take 1 tablet (2.5 mg total) by mouth nightly.      glipiZIDE (GLUCOTROL XL) 5 MG 24 hr tablet Take 1 tablet (5 mg total) by mouth daily.      glucosamine-chondroitin (OSTEO BI-FLEX) 250-200 mg Tab Take 1 tablet by mouth daily.      hydrALAZINE (APRESOLINE) 50 MG tablet Take 1 tablet (50 mg total) by mouth Three (3) times a day.      ipratropium-albuterol (DUO-NEB) 0.5-2.5 mg/3 mL nebulizer 3 mL every four (4) hours as needed.  3    liraglutide (VICTOZA 2-PAK) 0.6 mg/0.1 mL (18 mg/3 mL) injection Inject 0.3 mL (1.8 mg total) under the skin nightly.      metoPROLOL succinate (TOPROL-XL) 25 MG 24 hr tablet Take 1 tablet (25 mg total) by mouth daily.      mometasone-formoterol (DULERA) 200-5 mcg/actuation HFAA Inhale 2 puffs two (2) times a day.      potassium chloride 20 MEQ ER tablet Take 1.5 tablets (30 mEq total) by mouth nightly.      predniSONE (DELTASONE) 10 MG tablet PLEASE SEE ATTACHED FOR DETAILED DIRECTIONS      sucralfate (CARAFATE) 100 mg/mL suspension Take 10 mL (1 g total) by mouth nightly. 420 mL 4    vit  C/E/Zn/coppr/lutein/zeaxan (PRESERVISION AREDS 2 ORAL) Take 1 tablet by mouth two (2) times a day.      zafirlukast (ACCOLATE) 20 MG tablet Take 1 tablet (20 mg total) by mouth two (2) times a day.       No current facility-administered medications for this visit.        Changes to medications:  restarting Aldactone and increase in Metoprolol dose    No Known Allergies    Changes to allergies: No    SPECIALTY MEDICATION ADHERENCE     Calquence 100 mg: 10-14 days of medicine on hand       Medication Adherence    Patient reported X missed doses in the last month: 0  Specialty Medication: calquence 100mg   Informant: child/children          Specialty medication(s) dose(s) confirmed: Regimen is correct and unchanged.     Are there any concerns with adherence? No    Adherence counseling provided? Not needed    CLINICAL MANAGEMENT AND INTERVENTION      Clinical Benefit Assessment:    Do you feel the medicine is effective or helping your condition? Yes    Clinical Benefit counseling provided? Not needed    Adverse Effects Assessment:    Are you experiencing any side effects? No    Are you experiencing difficulty administering your medicine? No    Quality of Life Assessment:    Quality of Life    Rheumatology  Oncology  1. What impact has your specialty medication had on the reduction of your daily pain or discomfort level?: None  2. On a scale of 1-10, how would you rate your ability to manage side effects associated with your specialty medication? (1=no issues, 10 = unable to take medication due to side effects): 1  Dermatology  Cystic Fibrosis          How many days over the past month did your condition  keep you from your normal activities? For example, brushing your teeth or getting up in the morning. 0    Have you discussed this with your provider? Not needed    Acute Infection Status:    Acute infections noted within Epic:  No active infections  Patient reported infection: None    Therapy Appropriateness:    Is therapy appropriate and patient progressing towards therapeutic goals? Yes, therapy is appropriate and should be continued    DISEASE/MEDICATION-SPECIFIC INFORMATION      N/A    Oncology: Is the patient receiving adequate infection prevention treatment? Not applicable  Does the patient have adequate nutritional support? Not applicable    PATIENT SPECIFIC NEEDS     Does the patient have any physical, cognitive, or cultural barriers? No    Is the patient high risk? Yes, patient is taking oral chemotherapy. Appropriateness of therapy as been assessed    Did the patient require a clinical intervention? No    Does the patient require physician intervention or other additional services (i.e., nutrition, smoking cessation, social work)? No    SOCIAL DETERMINANTS OF HEALTH     At the [facility], we have learned that life circumstances - like trouble affording food, housing, utilities, or transportation can affect the health of many of our patients.   That is why we wanted to ask: are you currently experiencing any life circumstances that are negatively impacting your health and/or quality of life? No    Social Determinants of Health     Financial Resource Strain: Low Risk  (  09/10/2022)    Overall Financial Resource Strain (CARDIA)     Difficulty of Paying Living Expenses: Not hard at all   Internet Connectivity: No Internet connectivity concern identified (09/10/2022)    Internet Connectivity     Do you have access to internet services: Yes     How do you connect to the internet: Personal Device at home     Is your internet connection strong enough for you to watch video on your device without major problems?: Yes     Do you have enough data to get through the month?: Yes     Does at least one of the devices have a camera that you can use for video chat?: Yes   Food Insecurity: No Food Insecurity (09/10/2022)    Hunger Vital Sign     Worried About Running Out of Food in the Last Year: Never true     Ran Out of Food in the Last Year: Never true   Tobacco Use: Low Risk  (12/09/2022)    Patient History     Smoking Tobacco Use: Never     Smokeless Tobacco Use: Never     Passive Exposure: Not on file   Housing/Utilities: Low Risk  (09/10/2022)    Housing/Utilities     Within the past 12 months, have you ever stayed: outside, in a car, in a tent, in an overnight shelter, or temporarily in someone else's home (i.e. couch-surfing)?: No     Are you worried about losing your housing?: No     Within the past 12 months, have you been unable to get utilities (heat, electricity) when it was really needed?: No   Alcohol Use: Not At Risk (09/10/2022)    Alcohol Use     How often do you have a drink containing alcohol?: Never     How many drinks containing alcohol do you have on a typical day when you are drinking?: Not on file     How often do you have 5 or more drinks on one occasion?: Never   Transportation Needs: No Transportation Needs (09/10/2022)    PRAPARE - Transportation     Lack of Transportation (Medical): No Lack of Transportation (Non-Medical): No   Substance Use: Low Risk  (09/10/2022)    Substance Use     Taken prescription drugs for non-medical reasons: Never     Taken illegal drugs: Never     Patient indicated they have taken drugs in the past year for non-medical reasons: Yes, [positive answer(s)]: Not on file   Health Literacy: Low Risk  (09/10/2022)    Health Literacy     : Never   Physical Activity: Sufficiently Active (09/10/2022)    Exercise Vital Sign     Days of Exercise per Week: 7 days     Minutes of Exercise per Session: 50 min   Interpersonal Safety: Not at risk (09/10/2022)    Interpersonal Safety     Unsafe Where You Currently Live: No     Physically Hurt by Anyone: No     Abused by Anyone: No   Stress: Stress Concern Present (09/10/2022)    [employer] of Occupational Health - Occupational Stress Questionnaire     Feeling of Stress : To some extent   Intimate Partner Violence: Not At Risk (09/10/2022)    Humiliation, Afraid, Rape, and Kick questionnaire     Fear of Current or Ex-Partner: No     Emotionally Abused: No     Physically Abused:  No     Sexually Abused: No   Depression: Not at risk (09/10/2022)    PHQ-2     PHQ-2 Score: 0   Social Connections: Moderately Integrated (09/10/2022)    Social Connection and Isolation Panel [NHANES]     Frequency of Communication with Friends and Family: More than three times a week     Frequency of Social Gatherings with Friends and Family: More than three times a week     Attends Religious Services: More than 4 times per year     Active Member of [employer] or Organizations: Yes     Attends Banker Meetings: More than 4 times per year     Marital Status: Widowed       Would you be willing to receive help with any of the needs that you have identified today? Not applicable       SHIPPING     Specialty Medication(s) to be Shipped:   Hematology/Oncology: Calquence    Other medication(s) to be shipped: No additional medications requested for fill at this time     Changes to insurance: No    Delivery Scheduled: Yes, Expected medication delivery date: 01/07/23.     Medication will be delivered via Next Day Courier to the confirmed prescription address in [facility].    The patient will receive a drug information handout for each medication shipped and additional FDA Medication Guides as required.  Verified that patient has previously received a Conservation officer, historic buildings and a Surveyor, mining.    The patient or caregiver noted above participated in the development of this care plan and knows that they can request review of or adjustments to the care plan at any time.      All of the patient's questions and concerns have been addressed.    Berta Denson Vangie Bicker, PharmD   [facility] Specialty Pharmacist

## 2023-01-06 MED FILL — CALQUENCE (ACALABRUTINIB MALEATE) 100 MG TABLET: ORAL | 30 days supply | Qty: 60 | Fill #4

## 2023-01-07 DIAGNOSIS — C189 Malignant neoplasm of colon, unspecified: Principal | ICD-10-CM

## 2023-01-10 ENCOUNTER — Ambulatory Visit: Admit: 2023-01-10 | Discharge: 2023-01-10 | Payer: MEDICARE

## 2023-01-10 DIAGNOSIS — C9110 Chronic lymphocytic leukemia of B-cell type not having achieved remission: Principal | ICD-10-CM

## 2023-01-10 DIAGNOSIS — D801 Nonfamilial hypogammaglobulinemia: Principal | ICD-10-CM

## 2023-01-10 DIAGNOSIS — D509 Iron deficiency anemia, unspecified: Principal | ICD-10-CM

## 2023-01-10 LAB — CBC W/ AUTO DIFF
BASOPHILS ABSOLUTE COUNT: 0.1 10*9/L (ref 0.0–0.1)
BASOPHILS RELATIVE PERCENT: 0.7 %
EOSINOPHILS ABSOLUTE COUNT: 0 10*9/L (ref 0.0–0.5)
EOSINOPHILS RELATIVE PERCENT: 0.4 %
HEMATOCRIT: 35.3 % — ABNORMAL LOW (ref 39.0–48.0)
HEMOGLOBIN: 11.5 g/dL — ABNORMAL LOW (ref 12.9–16.5)
LYMPHOCYTES ABSOLUTE COUNT: 3.4 10*9/L (ref 1.1–3.6)
LYMPHOCYTES RELATIVE PERCENT: 33.9 %
MEAN CORPUSCULAR HEMOGLOBIN CONC: 32.6 g/dL (ref 32.0–36.0)
MEAN CORPUSCULAR HEMOGLOBIN: 26.9 pg (ref 25.9–32.4)
MEAN CORPUSCULAR VOLUME: 82.3 fL (ref 77.6–95.7)
MEAN PLATELET VOLUME: 8.4 fL (ref 6.8–10.7)
MONOCYTES ABSOLUTE COUNT: 0.9 10*9/L — ABNORMAL HIGH (ref 0.3–0.8)
MONOCYTES RELATIVE PERCENT: 8.7 %
NEUTROPHILS ABSOLUTE COUNT: 5.7 10*9/L (ref 1.8–7.8)
NEUTROPHILS RELATIVE PERCENT: 56.3 %
PLATELET COUNT: 136 10*9/L — ABNORMAL LOW (ref 150–450)
RED BLOOD CELL COUNT: 4.29 10*12/L (ref 4.26–5.60)
RED CELL DISTRIBUTION WIDTH: 18 % — ABNORMAL HIGH (ref 12.2–15.2)
WBC ADJUSTED: 10.1 10*9/L (ref 3.6–11.2)

## 2023-01-10 LAB — COMPREHENSIVE METABOLIC PANEL
ALBUMIN: 3 g/dL — ABNORMAL LOW (ref 3.5–5.0)
ALKALINE PHOSPHATASE: 188 U/L — ABNORMAL HIGH (ref 46–116)
ALT (SGPT): 20 U/L (ref 12–78)
ANION GAP: 6 mmol/L (ref 3–11)
AST (SGOT): 11 U/L — ABNORMAL LOW (ref 15–40)
BILIRUBIN TOTAL: 0.4 mg/dL (ref 0.2–1.0)
BLOOD UREA NITROGEN: 47 mg/dL — ABNORMAL HIGH (ref 8–20)
BUN / CREAT RATIO: 27
CALCIUM: 8.5 mg/dL (ref 8.5–10.1)
CHLORIDE: 103 mmol/L (ref 98–107)
CO2: 28.5 mmol/L (ref 21.0–32.0)
CREATININE: 1.75 mg/dL — ABNORMAL HIGH (ref 0.80–1.30)
EGFR CKD-EPI (2021) MALE: 38 mL/min/{1.73_m2} — ABNORMAL LOW (ref >=60)
GLUCOSE RANDOM: 368 mg/dL — ABNORMAL HIGH (ref 70–179)
POTASSIUM: 4.8 mmol/L (ref 3.5–5.0)
PROTEIN TOTAL: 5.6 g/dL — ABNORMAL LOW (ref 6.0–8.0)
SODIUM: 137 mmol/L (ref 135–145)

## 2023-01-10 LAB — LACTATE DEHYDROGENASE: LACTATE DEHYDROGENASE: 205 U/L (ref 81–234)

## 2023-01-10 MED ADMIN — diphenhydrAMINE (BENADRYL) capsule/tablet 25 mg: 25 mg | ORAL | @ 14:00:00 | Stop: 2023-01-10

## 2023-01-10 MED ADMIN — immun glob G(IgG)-pro-IgA 0-50 (PRIVIGEN) 10 % intravenous solution 5 g: 5 g | INTRAVENOUS | @ 14:00:00 | Stop: 2023-01-10

## 2023-01-10 MED ADMIN — sodium chloride (NS) 0.9 % infusion: 20 mL/h | INTRAVENOUS | @ 14:00:00 | Stop: 2023-01-10

## 2023-01-10 MED ADMIN — acetaminophen (TYLENOL) tablet 650 mg: 650 mg | ORAL | @ 14:00:00 | Stop: 2023-01-10

## 2023-01-10 NOTE — Unmapped (Signed)
Infusion completed, IVIG protocol followed, tolerated well, VSS.  Patient provided blankets, drinks, and snacks as needed throughout infusion.  Dr. Jomarie Longs visited at chairside. Comfort continually assessed by staff.  PIV de-accessed per [facility] protocol. Patient discharged in stable condition, ambulatory, with family, using cane.  Return to clinic information provided.

## 2023-01-10 NOTE — Unmapped (Signed)
Latest Reference Range & Units 12/09/22 12:57 01/10/23 09:34   WBC 3.6 - 11.2 10*9/L 11.4 (H) 10.1   RBC 4.26 - 5.60 10*12/L 4.32 4.29   HGB 12.9 - 16.5 g/dL 11.9 (L) 14.7 (L)   HCT 39.0 - 48.0 % 36.2 (L) 35.3 (L)   MCV 77.6 - 95.7 fL 83.8 82.3   MCH 25.9 - 32.4 pg 26.1 26.9   MCHC 32.0 - 36.0 g/dL 82.9 (L) 56.2   RDW 13.0 - 15.2 % 19.6 (H) 18.0 (H)   MPV 6.8 - 10.7 fL 8.4 8.4   Platelet 150 - 450 10*9/L 110 (L) 136 (L)   Neutrophils % % 74.0 56.3   Lymphocytes % % 21.4 33.9   Monocytes % % 4.2 8.7   Eosinophils % % 0.2 0.4   Basophils % % 0.2 0.7   Absolute Neutrophils 1.8 - 7.8 10*9/L 8.4 (H) 5.7   Absolute Lymphocytes 1.1 - 3.6 10*9/L 2.4 3.4   Absolute Monocytes  0.3 - 0.8 10*9/L 0.5 0.9 (H)   Absolute Eosinophils 0.0 - 0.5 10*9/L 0.0 0.0   Absolute Basophils  0.0 - 0.1 10*9/L 0.0 0.1   Anisocytosis Not Present  Moderate ! Slight !   Sodium 135 - 145 mmol/L 140 137   Potassium 3.5 - 5.0 mmol/L 4.4 4.8   Chloride 98 - 107 mmol/L 106 103   CO2 21.0 - 32.0 mmol/L 30.6 28.5   Bun 8 - 20 mg/dL 38 (H) 47 (H)   Creatinine 0.80 - 1.30 mg/dL 8.65 (H) 7.84 (H)   BUN/Creatinine Ratio  27 27   eGFR CKD-EPI (2021) Male >=60 mL/min/1.88m2 49 (L) 38 (L)   Anion Gap 3 - 11 mmol/L 3 6   Glucose 70 - 179 mg/dL 696 (H) 295 (H)   Calcium 8.5 - 10.1 mg/dL 8.8 8.5   Albumin 3.5 - 5.0 g/dL 2.7 (L) 3.0 (L)   Total Protein 6.0 - 8.0 g/dL 5.2 (L) 5.6 (L)   Total Bilirubin 0.2 - 1.0 mg/dL 0.4 0.4   SGOT (AST) 15 - 40 U/L 19 11 (L)   ALT 12 - 78 U/L 29 20   Alkaline Phosphatase 46 - 116 U/L 131 (H) 188 (H)   (H): Data is abnormally high  (L): Data is abnormally low  !: Data is abnormal

## 2023-01-10 NOTE — Unmapped (Unsigned)
HEMATOLOGY ONCOLOGY CLINIC:  INTERVAL FOLLOW UP  NOTE    PCP:  Arturo Morton, MD    DATE OF ENCOUNTER:  01/10/2023    INTERVAL HISTORY:  Christian Abbott is an 88 y.o. male who comes today for continued follow-up of chronic lymphocytic leukemia.  After recent colonoscopy showing a transverse colon  adenocarcinoma he underwent a right and transverse colectomy on 09/10/22 with Dr Elenore Rota. Healing well post op. Family reports that he appears more energetic/up beat.  Now 7 weeks post op.  Reports that he appetite has improved.  He has restarted calquence on 10/09/22.  Currently feeling well. Reports energy level increased. Able to be more active around the house.   ECOG PS 2.       ONCOLOGY HISTORY  Hematology/Oncology History Overview Note   88 y/o male with long-standing history of chronic lymphocytic leukemia initially diagnosed in 1998  Status post Rituxan based therapy for 8 weeks during that time  History of anemia treated with Procrit off and on  History of hypertension diabetes asthma hypercholesterolemia.  History of hospitalization in January 2014 for bilateral pneumonia [facility] for 3 weeks.     CLL (chronic lymphocytic leukemia) (CMS-HCC)   07/18/1997 Initial Diagnosis    CLL (chronic lymphocytic leukemia)     Colon adenocarcinoma (CMS-HCC)   09/10/2022 -  Cancer Staged    Staging form: Colon and Rectum, AJCC 8th Edition  - Pathologic stage from 09/10/2022: pT4, pN0, cM0 - Signed by Elyse Hsu, MD on 11/03/2022       11/03/2022 Initial Diagnosis    Colon adenocarcinoma (CMS-HCC)           PAST MEDICAL HISTORY:     Patient Active Problem List   Diagnosis    CLL (chronic lymphocytic leukemia) (CMS-HCC)    Iron deficiency anemia    Hypogammaglobulinemia (CMS-HCC)    Anemia    Need for prophylactic immunotherapy    Colon adenocarcinoma (CMS-HCC)      has a past surgical history that includes Colonoscopy (2021); Esophagogastroduodenoscopy; Inguinal hernia repair; Cervical spine surgery (2012); Coronary angioplasty with stent (2014); Cataract extraction, bilateral; pr upper gi endoscopy,w/dir submuc inj (N/A, 08/10/2022); pr upper gi endoscopy,biopsy (N/A, 08/10/2022); and pr lap,surg,colectomy,w/remvl term ileum (N/A, 09/10/2022).    ALLERGIES:  has No Known Allergies.      MEDICATIONS: has a current medication list which includes the following prescription(s): acalabrutinib, albuterol 2.5 mg /3 mL (0.083 %) Nebu 3 mL, albuterol 5 mg/mL Nebu 0.5 mL, amoxicillin-clavulanate, atorvastatin, bumetanide, cholecalciferol (vitamin d3-125 mcg (5,000 unit)), clopidogrel, denta 5000 plus, doxazosin, empagliflozin, famotidine, folic acid, glipizide, glipizide, glucosamine-chondroitin, hydralazine, ipratropium-albuterol, victoza 2-pak, metoprolol succinate, dulera, potassium chloride, prednisone, sucralfate, vit c/e/zn/coppr/lutein/zeaxan, and zafirlukast, and the following Facility-Administered Medications: immun glob g(igg)-pro-iga 0-50 **FOLLOWED BY** [EXPIRED] immun glob g(igg)-pro-iga 0-50 and sodium chloride.    FAMILY HISTORY / SOCIAL HISTORY:  Reviewed and updated as appropriate.    REVIEW OF SYSTEMS:  As per interval history.  All other systems reviewed and negative.    PHYSICAL EXAM    Vitals: There were no vitals taken for this visit.  General:  Pleasant, no acute distress.    Pain:  Pain Evaluation:                                   Pain Score (0 - 1):  Pain Location:                                       Eyes:  Appears normal.   HENT:  Atraumatic.   Neck:  thyroid midline.  Lymphatics: Bilateral small cervical lymph nodes.  No supraclavicular axillary or inguinal lymphadenopathy   Cardiovascular:   RRR, trace edema b/l ankles.   Respiratory:  Unlabored.  Good air entry b/l. No wheezing.  CTABL   Gastrointestinal: Does not appear distended.  Nontender. No palpable HSM.   Skin:  No rashes or subcutaneous nodules.    Musculoskeletal:  Gait is steady     Psychiatric: Affect appropriate. Judgment and insight nl.  Neurologic:  Alert and oriented x3. Grossly non-focal.              RECENT LABS/PATHOLOGY:  Results for orders placed or performed in visit on 01/10/23   Comprehensive Metabolic Panel   Result Value Ref Range    Sodium 137 135 - 145 mmol/L    Potassium 4.8 3.5 - 5.0 mmol/L    Chloride 103 98 - 107 mmol/L    CO2 28.5 21.0 - 32.0 mmol/L    Anion Gap 6 3 - 11 mmol/L    BUN 47 (H) 8 - 20 mg/dL    Creatinine 1.61 (H) 0.80 - 1.30 mg/dL    BUN/Creatinine Ratio 27     eGFR CKD-EPI (2021) Male 38 (L) >=60 mL/min/1.84m2    Glucose 368 (H) 70 - 179 mg/dL    Calcium 8.5 8.5 - 09.6 mg/dL    Albumin 3.0 (L) 3.5 - 5.0 g/dL    Total Protein 5.6 (L) 6.0 - 8.0 g/dL    Total Bilirubin 0.4 0.2 - 1.0 mg/dL    AST 11 (L) 15 - 40 U/L    ALT 20 12 - 78 U/L    Alkaline Phosphatase 188 (H) 46 - 116 U/L   Lactate dehydrogenase   Result Value Ref Range    LDH 205 81 - 234 U/L   CBC w/ Differential   Result Value Ref Range    WBC 10.1 3.6 - 11.2 10*9/L    RBC 4.29 4.26 - 5.60 10*12/L    HGB 11.5 (L) 12.9 - 16.5 g/dL    HCT 04.5 (L) 40.9 - 48.0 %    MCV 82.3 77.6 - 95.7 fL    MCH 26.9 25.9 - 32.4 pg    MCHC 32.6 32.0 - 36.0 g/dL    RDW 81.1 (H) 91.4 - 15.2 %    MPV 8.4 6.8 - 10.7 fL    Platelet 136 (L) 150 - 450 10*9/L    Neutrophils % 56.3 %    Lymphocytes % 33.9 %    Monocytes % 8.7 %    Eosinophils % 0.4 %    Basophils % 0.7 %    Absolute Neutrophils 5.7 1.8 - 7.8 10*9/L    Absolute Lymphocytes 3.4 1.1 - 3.6 10*9/L    Absolute Monocytes 0.9 (H) 0.3 - 0.8 10*9/L    Absolute Eosinophils 0.0 0.0 - 0.5 10*9/L    Absolute Basophils 0.1 0.0 - 0.1 10*9/L    Anisocytosis Slight (A) Not Present     RADIOLOGY    08/13/22 CT chest  1.  No evidence of thoracic metastatic disease.   2.  Decreased mediastinal and hilar lymphadenopathy, compatible with improved CLL.  08/13/2022 CT ap   Impression   1.  Mass-like thickening of the transverse colon, compatible with adenocarcinoma.   2.  Few hepatic lesions are likely benign but incompletely evaluated. Recommend MRI.   3.  Resolution of lymphadenopathy and splenomegaly, compatible with improved CLL.       ASSESSMENT and PLAN:    # Chronic lymphocytic leukemia  --Rai stage IV CLL  --CLL FISH panel shows 13 q. deletion 21.5%, IGHV mutated -good prognostic features  --11/05/2020: TP53 mutation negative.  NOTCH1 negative.  SF3B1 positive.  SF3B1 positivity indicates poor prognosis.  -- no evidence of hemolysis.   --Indication for treatment:  advanced stage and history of thrombocytopenia  -- He has been treated with Rituxan many years ago by Dr Thedore Mins, with good response in the 1990s. Treatment indication at that time may have been frequent infections.  -- started acabrutinib 12/19/20 - held 1 week prior to surgery.   -- reviewed CBC, CMP today.  ALC remains low.  Platelets stable at 110.      PLAN:  -- Restarted acalabrutinib 100 mg twice daily 10/09/2022.  -- Last IVIG dose given 10/08/2022. Next IVIG dose will be given in 1 month at next visit.        #Stage II Colon adenocarcinoma  - mucinous features[pT4N0M0]  - recent colonoscopy for evaluation of iron deficiency anemia revealed a transverse colon lesion consistent with adenocarcinoma --MMR deficient, MLH1 and PMS2 absent expression.  BRAF negative  MLH1 hypermethylation + c/with sporadic MMR-deficient colon adenoCA  -- Staging CT chest without contrast and CT abdomen and pelvis with contrast scheduled for today afternoon  -- 09/10/21 Partial colectomy   -- reviewed pathology in detail and discussed [facility] colon cancer nomogram which shows a >90 % 3 year and 5 year PFS   -- I explained the inherent resistance to 24fu with MMR-deficient colon cancers and I would not recommend adjuvant FOLFOX for him given his age ,and comorbidities. His PS is recovering well after surgery however he is still quite frail. PS 2.   -- we reviewed ctDNA results 10/01/22 low positive 0.02-->10/19/22  negative (0)  -- Although initial tet was positive - this test was very low positive and ~ 3w later on 2/20 repeat testing was negative. This suggests low risk of distant metastasis.   -- given age, comorbidites would recommend against Adjuvant FOLFOX.   -- I reviewed MRI abdomen - due to secondary hemachromatosis nonspecific liver lesions remained indeterminate.       PLAN:  Avoid adjuvant chemotherapy (10/19/22 ctDNA negative)  Continued surveillance.   Will repeat CT Chest and MRI abdomen with/without contrast in mid 01/2023      # Patchy lung opacities  -- most recent CT chest from 05/16/2021 mixed response with regardin the GGOs.  He continues to have no infectious symptoms.  Patient is we would expect after his COVID diagnosis.  He followed up with Dr. Virgia Land recently.  Findings on his CT chest are felt to represent volume overload as noted by a recent elevation in proBNP on 06/02/2021.  No pulmonary intervention was recommended by Dr. Virgia Land at this time.  --follow up CT chest 08/13/21 showed overall improved aeration. Small indeterminate micronodules at [place] and per radiology require 6-12 month follow up .   -- he will continue following with Dr Ace Gins.             #Iron deficiency anemia  -- He was noted to have iron deficiency in 2014 and has  since received IV iron in the past and has been on oral iron 1 pill/day.   -- iron deficiency anemia/ACD in 07/2019 with hb down to 8.7 and ferritin 78, iron sat 12%  -- now s/p 1020 mg ferheme 07/2019 and early 10/2018 with hb improvement to 10.9 today.   --EGD and colonoscopy with Dr. Loreta Ave 01/2020 revealed bleeding gastric ulcer, smaller antral ulcer with old blood. .  Currently being managed with sucralfate.  -- In June 2023 hemoglobin was down to 8.3.  He was given additional IV iron with Feraheme 510 mg x 2 doses  -- seen by Gi and planned for EGD , pending cardiology clearance  -- s/p feraheme 510 mg x 2 doses 9/28-10/9  --Scheduled for IV iron 08/17/2022 and 08/27/2022  -- CTM         # Hypogammaglobulinemia   --History level continues to be low however he has not had any more infections since we started him on IVIG. Before we started him on IVIG he has had multiple hospitalizations for pneumonias.  --Therefore we will continue IVIG for now every 3 months as he is tolerated this well.  -- Last dose 06/25/22.   -- will schedule soon.          # Health maintenance:  --  Evusheld  #1 12/04/20   -- bivalent booster  06/01/21  -- influenza vaccine 06/01/21  --evusheld  #2 07/01/21          # diastolic CHF with volume overload  -- followed closely by Cardiology.recently switched to bumex.   -- diuresis has bee associated with some degree of renal dysfunction in the last 5-6 months  -- he will continue following closely with Cardiology    # CKD Stage IV  -- Following closely with [facility].  -- Cr stable      DISPO:  RTC in 4 weeks for MD visit     I personally spent 40 minutes face-to-face and non-face-to-face in the care of this patient, which includes all pre, intra, and post visit time on the date of service.        Elyse Hsu, MD  [facility]

## 2023-01-26 NOTE — Unmapped (Signed)
[facility] Refill Coordination Note    Specialty Medication(s) to be Shipped:   Hematology/Oncology: Calquence 100mg     Other medication(s) to be shipped: No additional medications requested for fill at this time     Christian Abbott, DOB: 1935-01-20  Phone: [phone] (home)       All above HIPAA information was verified with  patient's daughter      Was a Nurse, learning disability used for this call? No    Completed refill call assessment today to schedule patient's medication shipment from the [facility] [phone]).  All relevant notes have been reviewed.     Specialty medication(s) and dose(s) confirmed: Regimen is correct and unchanged.   Changes to medications: Caine reports no changes at this time.  Changes to insurance: No  New side effects reported not previously addressed with a pharmacist or physician: None reported  Questions for the pharmacist: No    Confirmed patient received a Conservation officer, historic buildings and a Surveyor, mining with first shipment. The patient will receive a drug information handout for each medication shipped and additional FDA Medication Guides as required.       DISEASE/MEDICATION-SPECIFIC INFORMATION        N/A    SPECIALTY MEDICATION ADHERENCE     Medication Adherence    Patient reported X missed doses in the last month: 0  Specialty Medication: Calquence  Patient is on additional specialty medications: No  Informant: patient              Were doses missed due to medication being on hold? No    Calquence 100 mg: 15 days of medicine on hand       REFERRAL TO PHARMACIST     Referral to the pharmacist: Not needed      [facility]     Shipping address confirmed in Epic.     Delivery Scheduled: Yes, Expected medication delivery date: 02/09/23.     Medication will be delivered via Next Day Courier to the prescription address in Epic WAM.    [name]   [facility] Specialty Technician

## 2023-02-08 MED FILL — CALQUENCE (ACALABRUTINIB MALEATE) 100 MG TABLET: ORAL | 30 days supply | Qty: 60 | Fill #5

## 2023-02-09 DIAGNOSIS — C189 Malignant neoplasm of colon, unspecified: Principal | ICD-10-CM

## 2023-02-09 DIAGNOSIS — C9110 Chronic lymphocytic leukemia of B-cell type not having achieved remission: Principal | ICD-10-CM

## 2023-02-10 ENCOUNTER — Ambulatory Visit: Admit: 2023-02-10 | Discharge: 2023-02-11 | Payer: MEDICARE

## 2023-02-10 LAB — CBC W/ AUTO DIFF
BASOPHILS ABSOLUTE COUNT: 0 10*9/L (ref 0.0–0.1)
BASOPHILS RELATIVE PERCENT: 0.7 %
EOSINOPHILS ABSOLUTE COUNT: 0.1 10*9/L (ref 0.0–0.5)
EOSINOPHILS RELATIVE PERCENT: 0.7 %
HEMATOCRIT: 36.1 % — ABNORMAL LOW (ref 39.0–48.0)
HEMOGLOBIN: 11.7 g/dL — ABNORMAL LOW (ref 12.9–16.5)
LYMPHOCYTES ABSOLUTE COUNT: 3.9 10*9/L — ABNORMAL HIGH (ref 1.1–3.6)
LYMPHOCYTES RELATIVE PERCENT: 55.3 %
MEAN CORPUSCULAR HEMOGLOBIN CONC: 32.6 g/dL (ref 32.0–36.0)
MEAN CORPUSCULAR HEMOGLOBIN: 27.3 pg (ref 25.9–32.4)
MEAN CORPUSCULAR VOLUME: 83.7 fL (ref 77.6–95.7)
MEAN PLATELET VOLUME: 8 fL (ref 6.8–10.7)
MONOCYTES ABSOLUTE COUNT: 0.6 10*9/L (ref 0.3–0.8)
MONOCYTES RELATIVE PERCENT: 8.5 %
NEUTROPHILS ABSOLUTE COUNT: 2.5 10*9/L (ref 1.8–7.8)
NEUTROPHILS RELATIVE PERCENT: 34.8 %
PLATELET COUNT: 102 10*9/L — ABNORMAL LOW (ref 150–450)
RED BLOOD CELL COUNT: 4.31 10*12/L (ref 4.26–5.60)
RED CELL DISTRIBUTION WIDTH: 16.3 % — ABNORMAL HIGH (ref 12.2–15.2)
WBC ADJUSTED: 7.1 10*9/L (ref 3.6–11.2)

## 2023-02-10 LAB — COMPREHENSIVE METABOLIC PANEL
ALBUMIN: 3.1 g/dL — ABNORMAL LOW (ref 3.5–5.0)
ALKALINE PHOSPHATASE: 146 U/L — ABNORMAL HIGH (ref 46–116)
ALT (SGPT): 23 U/L (ref 12–78)
ANION GAP: 8 mmol/L (ref 3–11)
AST (SGOT): 15 U/L (ref 15–40)
BILIRUBIN TOTAL: 0.3 mg/dL (ref 0.2–1.0)
BLOOD UREA NITROGEN: 48 mg/dL — ABNORMAL HIGH (ref 8–20)
BUN / CREAT RATIO: 32
CALCIUM: 8.4 mg/dL — ABNORMAL LOW (ref 8.5–10.1)
CHLORIDE: 107 mmol/L (ref 98–107)
CO2: 27.2 mmol/L (ref 21.0–32.0)
CREATININE: 1.49 mg/dL — ABNORMAL HIGH (ref 0.80–1.30)
EGFR CKD-EPI (2021) MALE: 46 mL/min/{1.73_m2} — ABNORMAL LOW (ref >=60)
GLUCOSE RANDOM: 217 mg/dL — ABNORMAL HIGH (ref 70–179)
POTASSIUM: 4.2 mmol/L (ref 3.5–5.0)
PROTEIN TOTAL: 5.6 g/dL — ABNORMAL LOW (ref 6.0–8.0)
SODIUM: 142 mmol/L (ref 135–145)

## 2023-02-10 LAB — LACTATE DEHYDROGENASE: LACTATE DEHYDROGENASE: 218 U/L (ref 81–234)

## 2023-02-10 LAB — SLIDE REVIEW

## 2023-02-10 MED ORDER — SUCRALFATE 100 MG/ML ORAL SUSPENSION
Freq: Every evening | ORAL | 4 refills | 42 days | Status: CP
Start: 2023-02-10 — End: ?

## 2023-02-10 NOTE — Unmapped (Signed)
HEMATOLOGY ONCOLOGY CLINIC:  INTERVAL FOLLOW UP  NOTE    PCP:  Arturo Morton, MD    DATE OF ENCOUNTER:  02/10/2023    INTERVAL HISTORY:  Christian Abbott is an 88 y.o. male who comes today for continued follow-up of chronic lymphocytic leukemia.    Received IVIG last on 01/10/2023.  Next dose is due in mid August 2024.    After recent colonoscopy showing a transverse colon  adenocarcinoma he underwent a right and transverse colectomy on 09/10/22 with Dr Elenore Rota. He has restarted calquence on 10/09/22.  Currently feeling well. Reports energy levels have continued to steadily increase. Able to be more active around the house.  Accompanied by his son today.  ECOG PS 2.       ONCOLOGY HISTORY  Hematology/Oncology History Overview Note   88 y/o male with long-standing history of chronic lymphocytic leukemia initially diagnosed in 1998  Status post Rituxan based therapy for 8 weeks during that time  History of anemia treated with Procrit off and on  History of hypertension diabetes asthma hypercholesterolemia.  History of hospitalization in January 2014 for bilateral pneumonia [facility] for 3 weeks.     CLL (chronic lymphocytic leukemia) (CMS-HCC)   07/18/1997 Initial Diagnosis    CLL (chronic lymphocytic leukemia)     Colon adenocarcinoma (CMS-HCC)   09/10/2022 -  Cancer Staged    Staging form: Colon and Rectum, AJCC 8th Edition  - Pathologic stage from 09/10/2022: pT4, pN0, cM0 - Signed by Elyse Hsu, MD on 11/03/2022       11/03/2022 Initial Diagnosis    Colon adenocarcinoma (CMS-HCC)           PAST MEDICAL HISTORY:     Patient Active Problem List   Diagnosis    CLL (chronic lymphocytic leukemia) (CMS-HCC)    Iron deficiency anemia    Hypogammaglobulinemia (CMS-HCC)    Anemia    Need for prophylactic immunotherapy    Colon adenocarcinoma (CMS-HCC)      has a past surgical history that includes Colonoscopy (2021); Esophagogastroduodenoscopy; Inguinal hernia repair; Cervical spine surgery (2012); Coronary angioplasty with stent (2014); Cataract extraction, bilateral; pr upper gi endoscopy,w/dir submuc inj (N/A, 08/10/2022); pr upper gi endoscopy,biopsy (N/A, 08/10/2022); and pr lap,surg,colectomy,w/remvl term ileum (N/A, 09/10/2022).    ALLERGIES:  has No Known Allergies.      MEDICATIONS: has a current medication list which includes the following prescription(s): acalabrutinib, albuterol 2.5 mg /3 mL (0.083 %) Nebu 3 mL, albuterol 5 mg/mL Nebu 0.5 mL, amoxicillin-clavulanate, atorvastatin, cholecalciferol (vitamin d3-125 mcg (5,000 unit)), denta 5000 plus, doxazosin, empagliflozin, famotidine, folic acid, glipizide, glipizide, glucosamine-chondroitin, hydralazine, ipratropium-albuterol, victoza 2-pak, metoprolol succinate, dulera, potassium chloride, prednisone, spironolactone, sucralfate, vit c/e/zn/coppr/lutein/zeaxan, zafirlukast, bumetanide, and clopidogrel.    FAMILY HISTORY / SOCIAL HISTORY:  Reviewed and updated as appropriate.    REVIEW OF SYSTEMS:  As per interval history.  All other systems reviewed and negative.    PHYSICAL EXAM    Vitals: BP 182/74 Comment: 2nd BP Is 148/60 taking maually - Pulse 60  - Temp 36.6 ??C (97.9 ??F) (Temporal)  - Resp 16  - Ht 165.1 cm (5' 5)  - Wt 63.5 kg (140 lb 1.6 oz)  - SpO2 97%  - BMI 23.31 kg/m??   General:  Pleasant, no acute distress.    Pain:  Pain Evaluation:  Pain Score (0 - 1):                                Pain Location:                                       Eyes:  Appears normal.   HENT:  Atraumatic.   Neck:  thyroid midline.  Lymphatics: Bilateral small cervical lymph nodes.  No supraclavicular axillary or inguinal lymphadenopathy   Cardiovascular:   RRR, trace edema b/l ankles.   Respiratory:  Unlabored.  Good air entry b/l. No wheezing.  Mild crackles at the left lower base.    Gastrointestinal: Does not appear distended.  Nontender. No palpable HSM.   Skin:  No rashes or subcutaneous nodules.    Musculoskeletal:  Gait is steady     Psychiatric:  Affect appropriate. Judgment and insight nl.  Neurologic:  Alert and oriented x3. Grossly non-focal.              RECENT LABS/PATHOLOGY:  Results for orders placed or performed in visit on 02/10/23   Comprehensive Metabolic Panel   Result Value Ref Range    Sodium 142 135 - 145 mmol/L    Potassium 4.2 3.5 - 5.0 mmol/L    Chloride 107 98 - 107 mmol/L    CO2 27.2 21.0 - 32.0 mmol/L    Anion Gap 8 3 - 11 mmol/L    BUN 48 (H) 8 - 20 mg/dL    Creatinine 1.61 (H) 0.80 - 1.30 mg/dL    BUN/Creatinine Ratio 32     eGFR CKD-EPI (2021) Male 46 (L) >=60 mL/min/1.75m2    Glucose 217 (H) 70 - 179 mg/dL    Calcium 8.4 (L) 8.5 - 10.1 mg/dL    Albumin 3.1 (L) 3.5 - 5.0 g/dL    Total Protein 5.6 (L) 6.0 - 8.0 g/dL    Total Bilirubin 0.3 0.2 - 1.0 mg/dL    AST 15 15 - 40 U/L    ALT 23 12 - 78 U/L    Alkaline Phosphatase 146 (H) 46 - 116 U/L   Lactate dehydrogenase   Result Value Ref Range    LDH 218 81 - 234 U/L   CBC w/ Differential   Result Value Ref Range    Results Verified by Slide Scan Slide Reviewed     WBC 7.1 3.6 - 11.2 10*9/L    RBC 4.31 4.26 - 5.60 10*12/L    HGB 11.7 (L) 12.9 - 16.5 g/dL    HCT 09.6 (L) 04.5 - 48.0 %    MCV 83.7 77.6 - 95.7 fL    MCH 27.3 25.9 - 32.4 pg    MCHC 32.6 32.0 - 36.0 g/dL    RDW 40.9 (H) 81.1 - 15.2 %    MPV 8.0 6.8 - 10.7 fL    Platelet 102 (L) 150 - 450 10*9/L    Neutrophils % 34.8 %    Lymphocytes % 55.3 %    Monocytes % 8.5 %    Eosinophils % 0.7 %    Basophils % 0.7 %    Absolute Neutrophils 2.5 1.8 - 7.8 10*9/L    Absolute Lymphocytes 3.9 (H) 1.1 - 3.6 10*9/L    Absolute Monocytes 0.6 0.3 - 0.8 10*9/L    Absolute Eosinophils 0.1 0.0 - 0.5 10*9/L  Absolute Basophils 0.0 0.0 - 0.1 10*9/L   Morphology Review   Result Value Ref Range    Rouleaux Present (A) Not Present    Toxic Granulation       RADIOLOGY    08/13/22 CT chest  1.  No evidence of thoracic metastatic disease.   2.  Decreased mediastinal and hilar lymphadenopathy, compatible with improved CLL. 08/13/2022 CT ap   Impression   1.  Mass-like thickening of the transverse colon, compatible with adenocarcinoma.   2.  Few hepatic lesions are likely benign but incompletely evaluated. Recommend MRI.   3.  Resolution of lymphadenopathy and splenomegaly, compatible with improved CLL.       ASSESSMENT and PLAN:    # Chronic lymphocytic leukemia  --Rai stage IV CLL  --CLL FISH panel shows 13 q. deletion 21.5%, IGHV mutated -good prognostic features  --11/05/2020: TP53 mutation negative.  NOTCH1 negative.  SF3B1 positive.  SF3B1 positivity indicates poor prognosis.  -- no evidence of hemolysis.   --Indication for treatment:  advanced stage and history of thrombocytopenia  -- He has been treated with Rituxan many years ago by Dr Thedore Mins, with good response in the 1990s. Treatment indication at that time may have been frequent infections.  -- started acabrutinib 12/19/20 - held 1 week prior to surgery on 09/10/22 and restarted on 10/09/2022.  -- reviewed CBC, CMP today.  ALC remains low.  Platelets improved to 135       PLAN:  -- Restarted acalabrutinib 100 mg twice daily 10/09/2022.  -- Last IVIG dose given 10/08/2022. Next IVIG dose today.         #Stage II Colon adenocarcinoma  - mucinous features[pT4N0M0]  - recent colonoscopy for evaluation of iron deficiency anemia revealed a transverse colon lesion consistent with adenocarcinoma --MMR deficient, MLH1 and PMS2 absent expression.  BRAF negative  MLH1 hypermethylation + c/with sporadic MMR-deficient colon adenoCA  -- Staging CT chest without contrast and CT abdomen and pelvis with contrast scheduled for today afternoon  -- 09/10/22 Partial colectomy   -- reviewed pathology in detail and discussed [facility] colon cancer nomogram which shows a >90 % 3 year and 5 year PFS   -- I explained the inherent resistance to 18fu with MMR-deficient colon cancers and I would not recommend adjuvant FOLFOX for him given his age ,and comorbidities. His PS is recovering well after surgery however he is still quite frail. PS 2.   -- we reviewed ctDNA results 10/01/22 low positive 0.02-->10/19/22  negative (0)  -- Although initial tet was positive - this test was very low positive and ~ 3w later on 2/20 repeat testing was negative. This suggests low risk of distant metastasis.   -- given age, comorbidites would recommend against Adjuvant FOLFOX.   -- I reviewed MRI abdomen - due to secondary hemachromatosis nonspecific liver lesions remained indeterminate.       PLAN:  Recent CT DNA testing remained negative from May 2024.  Will continue routine surveillance CT DNA testing via [facility] mobile [facility].  Continued surveillance visits every 3 months for first 2 years  Will repeat CT Chest and MRI abdomen with/without contrast in mid 01/2023-scheduled for 02/18/2023        # Patchy lung opacities  --follow up CT chest 08/13/22 Decreased mediastinal and hilar lymphadenopathy, compatible with improved CLL     #Iron deficiency anemia  -- He was noted to have iron deficiency in 2014 and has since received IV iron in the past and has been on  oral iron 1 pill/day.   -- iron deficiency anemia/ACD in 07/2019 with hb down to 8.7 and ferritin 78, iron sat 12%  -- now s/p 1020 mg ferheme 07/2019 and early 10/2018 with hb improvement to 10.9 today.   --EGD and colonoscopy with Dr. Loreta Ave 01/2020 revealed bleeding gastric ulcer, smaller antral ulcer with old blood. .  Currently being managed with sucralfate.  -- In June 2023 hemoglobin was down to 8.3.  He was given additional IV iron with Feraheme 510 mg x 2 doses  -- seen by Gi and planned for EGD , pending cardiology clearance  -- s/p feraheme 510 mg x 2 doses 9/28-10/9  --Scheduled for IV iron 08/17/2022 and 08/27/2022  -- CTM         # Hypogammaglobulinemia   --History level continues to be low however he has not had any more infections since we started him on IVIG. Before we started him on IVIG he has had multiple hospitalizations for pneumonias.  --Therefore we will continue IVIG for now every 3 months as he is tolerated this well.  - Next dose due ~ 04/12/2023        # diastolic CHF with volume overload  -- followed closely by Cardiology.recently switched to bumex.   -- diuresis has bee associated with some degree of renal dysfunction in the last 5-6 months  -- he will continue following closely with Cardiology    # CKD Stage IV  -- Following closely with [facility].  -- Cr stable      DISPO:  RTC in 4 weeks for MD visit       I personally spent 40 minutes face-to-face and non-face-to-face in the care of this patient, which includes all pre, intra, and post visit time on the date of service.        Elyse Hsu, MD  [facility]

## 2023-02-10 NOTE — Unmapped (Signed)
Latest Reference Range & Units 02/10/23 15:26   WBC 3.6 - 11.2 10*9/L 7.1   RBC 4.26 - 5.60 10*12/L 4.31   HGB 12.9 - 16.5 g/dL 81.1 (L)   HCT 91.4 - 78.2 % 36.1 (L)   MCV 77.6 - 95.7 fL 83.7   MCH 25.9 - 32.4 pg 27.3   MCHC 32.0 - 36.0 g/dL 95.6   RDW 21.3 - 08.6 % 16.3 (H)   MPV 6.8 - 10.7 fL 8.0   Platelet 150 - 450 10*9/L 102 (L)   Neutrophils % % 34.8   Lymphocytes % % 55.3   Monocytes % % 8.5   Eosinophils % % 0.7   Basophils % % 0.7   Absolute Neutrophils 1.8 - 7.8 10*9/L 2.5   Absolute Lymphocytes 1.1 - 3.6 10*9/L 3.9 (H)   Absolute Monocytes  0.3 - 0.8 10*9/L 0.6   Absolute Eosinophils 0.0 - 0.5 10*9/L 0.1   Absolute Basophils  0.0 - 0.1 10*9/L 0.0   Rouleaux Not Present  Present !   Toxic Granulation  See Comment   Sodium 135 - 145 mmol/L 142   Potassium 3.5 - 5.0 mmol/L 4.2   Chloride 98 - 107 mmol/L 107   CO2 21.0 - 32.0 mmol/L 27.2   Bun 8 - 20 mg/dL 48 (H)   Creatinine 5.78 - 1.30 mg/dL 4.69 (H)   BUN/Creatinine Ratio  32   eGFR CKD-EPI (2021) Male >=60 mL/min/1.36m2 46 (L)   Anion Gap 3 - 11 mmol/L 8   Glucose 70 - 179 mg/dL 629 (H)   Calcium 8.5 - 10.1 mg/dL 8.4 (L)   Albumin 3.5 - 5.0 g/dL 3.1 (L)   Total Protein 6.0 - 8.0 g/dL 5.6 (L)   Total Bilirubin 0.2 - 1.0 mg/dL 0.3   SGOT (AST) 15 - 40 U/L 15   ALT 12 - 78 U/L 23   Alkaline Phosphatase 46 - 116 U/L 146 (H)   (L): Data is abnormally low  (H): Data is abnormally high  !: Data is abnormal

## 2023-03-02 DIAGNOSIS — C9110 Chronic lymphocytic leukemia of B-cell type not having achieved remission: Principal | ICD-10-CM

## 2023-03-02 MED ORDER — ACALABRUTINIB MALEATE 100 MG TABLET
ORAL_TABLET | Freq: Two times a day (BID) | ORAL | 5 refills | 30 days | Status: CP
Start: 2023-03-02 — End: ?
  Filled 2023-03-10: qty 60, 30d supply, fill #0

## 2023-03-03 NOTE — Unmapped (Signed)
[facility] Refill Coordination Note    Specialty Medication(s) to be Shipped:   Hematology/Oncology: Calquence 100mg     Other medication(s) to be shipped: No additional medications requested for fill at this time     Christian Abbott, DOB: [birth year]  Phone: [phone] (home)       All above HIPAA information was verified with  patient's daughter      Was a Nurse, learning disability used for this call? No    Completed refill call assessment today to schedule patient's medication shipment from the [facility] [phone]).  All relevant notes have been reviewed.     Specialty medication(s) and dose(s) confirmed: Regimen is correct and unchanged.   Changes to medications: Ripken reports no changes at this time.  Changes to insurance: No  New side effects reported not previously addressed with a pharmacist or physician: None reported  Questions for the pharmacist: No    Confirmed patient received a Conservation officer, historic buildings and a Surveyor, mining with first shipment. The patient will receive a drug information handout for each medication shipped and additional FDA Medication Guides as required.       DISEASE/MEDICATION-SPECIFIC INFORMATION        N/A    SPECIALTY MEDICATION ADHERENCE     Medication Adherence    Patient reported X missed doses in the last month: 0  Specialty Medication: CALQUENCE 100 mg tablet (acalabrutinib)  Patient is on additional specialty medications: No  Informant: child/children              Were doses missed due to medication being on hold? No    Calquence 100 mg: 10 days of medicine on hand       REFERRAL TO PHARMACIST     Referral to the pharmacist: Not needed      [facility]     Shipping address confirmed in Epic.     Delivery Scheduled: Yes, Expected medication delivery date: 03/11/23.  However, Rx request for refills was sent to the provider as there are none remaining.     Medication will be delivered via Next Day Courier to the prescription address in Epic WAM.    [name]   [facility] Specialty Technician

## 2023-03-15 DIAGNOSIS — C189 Malignant neoplasm of colon, unspecified: Principal | ICD-10-CM

## 2023-03-17 ENCOUNTER — Ambulatory Visit: Admit: 2023-03-17 | Discharge: 2023-03-18 | Payer: MEDICARE

## 2023-03-17 LAB — CBC W/ AUTO DIFF
BASOPHILS ABSOLUTE COUNT: 0.1 10*9/L (ref 0.0–0.1)
BASOPHILS RELATIVE PERCENT: 0.7 %
EOSINOPHILS ABSOLUTE COUNT: 0.1 10*9/L (ref 0.0–0.5)
EOSINOPHILS RELATIVE PERCENT: 0.7 %
HEMATOCRIT: 38 % — ABNORMAL LOW (ref 39.0–48.0)
HEMOGLOBIN: 12.3 g/dL — ABNORMAL LOW (ref 12.9–16.5)
LYMPHOCYTES ABSOLUTE COUNT: 3.6 10*9/L (ref 1.1–3.6)
LYMPHOCYTES RELATIVE PERCENT: 45.3 %
MEAN CORPUSCULAR HEMOGLOBIN CONC: 32.5 g/dL (ref 32.0–36.0)
MEAN CORPUSCULAR HEMOGLOBIN: 26.8 pg (ref 25.9–32.4)
MEAN CORPUSCULAR VOLUME: 82.5 fL (ref 77.6–95.7)
MEAN PLATELET VOLUME: 8.2 fL (ref 6.8–10.7)
MONOCYTES ABSOLUTE COUNT: 0.6 10*9/L (ref 0.3–0.8)
MONOCYTES RELATIVE PERCENT: 8.1 %
NEUTROPHILS ABSOLUTE COUNT: 3.6 10*9/L (ref 1.8–7.8)
NEUTROPHILS RELATIVE PERCENT: 45.2 %
PLATELET COUNT: 97 10*9/L — ABNORMAL LOW (ref 150–450)
RED BLOOD CELL COUNT: 4.6 10*12/L (ref 4.26–5.60)
RED CELL DISTRIBUTION WIDTH: 16.3 % — ABNORMAL HIGH (ref 12.2–15.2)
WBC ADJUSTED: 8 10*9/L (ref 3.6–11.2)

## 2023-03-17 LAB — COMPREHENSIVE METABOLIC PANEL
ALBUMIN: 3.2 g/dL — ABNORMAL LOW (ref 3.5–5.0)
ALKALINE PHOSPHATASE: 153 U/L — ABNORMAL HIGH (ref 46–116)
ALT (SGPT): 24 U/L (ref 12–78)
ANION GAP: 8 mmol/L (ref 3–11)
AST (SGOT): 17 U/L (ref 15–40)
BILIRUBIN TOTAL: 0.4 mg/dL (ref 0.2–1.0)
BLOOD UREA NITROGEN: 42 mg/dL — ABNORMAL HIGH (ref 8–20)
BUN / CREAT RATIO: 28
CALCIUM: 8.7 mg/dL (ref 8.5–10.1)
CHLORIDE: 104 mmol/L (ref 98–107)
CO2: 26.1 mmol/L (ref 21.0–32.0)
CREATININE: 1.5 mg/dL — ABNORMAL HIGH (ref 0.80–1.30)
EGFR CKD-EPI (2021) MALE: 45 mL/min/{1.73_m2} — ABNORMAL LOW (ref >=60)
GLUCOSE RANDOM: 229 mg/dL — ABNORMAL HIGH (ref 70–179)
POTASSIUM: 3.7 mmol/L (ref 3.5–5.0)
PROTEIN TOTAL: 6 g/dL (ref 6.0–8.0)
SODIUM: 138 mmol/L (ref 135–145)

## 2023-03-17 LAB — LACTATE DEHYDROGENASE: LACTATE DEHYDROGENASE: 218 U/L (ref 81–234)

## 2023-03-17 LAB — SLIDE REVIEW

## 2023-03-17 NOTE — Unmapped (Deleted)
[facility] Established Patient Note     Patient Name: Christian Abbott   Date of Birth: 02/10/1935   Date of Encounter: 03/17/2023     PCP:  Arturo Morton, MD      Assessment and Plan    # Chronic Lymphocytic Leukemia  --Rai stage IV CLL  --CLL FISH panel shows 13 q. deletion 21.5%, IGHV mutated -good prognostic features  --11/05/2020: TP53 mutation negative.  NOTCH1 negative.  SF3B1 positive.  SF3B1 positivity indicates poor prognosis.  -- no evidence of hemolysis.   --Indication for treatment:  advanced stage and history of thrombocytopenia  -- He has been treated with Rituxan many years ago by Dr Thedore Mins, with good response in the 1990s. Treatment indication at that time may have been frequent infections.  -- Started Acalabrutinib 12/19/20 - held 1 week prior to surgery on 09/10/22 and restarted on 10/09/2022.  -- Reviewed CBC, CMP. ALC improved.  Platelets declined to 97. Platelet decline likely due to bone marrow suppression on Acalabrutinib      PLAN:  -- Continue Acalabrutinib 100 mg twice daily 10/09/2022.  -- Last IVIG dose given 01/10/2023. Next dose IVIG due 03/2023.        #Stage II Colon Adenocarcinoma  - Mucinous Features [pT4N0M0]  - Colonoscopy for evaluation of iron deficiency anemia revealed a transverse colon lesion consistent with adenocarcinoma --MMR deficient, MLH1 and PMS2 absent expression.  BRAF negative  MLH1 hypermethylation + c/with sporadic MMR-deficient colon adenoCA  -- Staging CT chest without contrast and CT abdomen and pelvis with contrast scheduled for today afternoon  -- 09/10/22 Partial colectomy   -- reviewed pathology in detail and discussed [facility] colon cancer nomogram which shows a >90 % 3 year and 5 year PFS   -- I explained the inherent resistance to 56fu with MMR-deficient colon cancers and I would not recommend adjuvant FOLFOX for him given his age ,and comorbidities. His PS is recovering well after surgery however he is still quite frail. PS 2.   -- Reviewed ctDNA results 10/01/22 low positive 0.02-->10/19/22  negative (0)  -- Although initial test was positive - this test was very low positive and ~ 3w later on 2/20 repeat testing was negative. This suggests low risk of distant metastasis.   -- Given age, comorbidites would recommend against Adjuvant FOLFOX.   -- I reviewed MRI abdomen - due to secondary hemachromatosis nonspecific liver lesions remained indeterminate. Repeat MRI Abdomen 02/18/2023 shows no evidence of metastatic disease in the abdomen   -- CT Chest Wo Contrast 02/18/2023 shows no evidence for new or progressive intrathoracic malignancy. Improved bibasal aeration with decreased juxtapleural reticular densities. Clustered reticulonodular densities within the lateral right middle and lower lobes, presumably infectious/inflammatory.       PLAN:  Recent CT DNA testing 12/2022 remained negative. Will continue routine surveillance CT DNA testing via [facility] mobile [facility] - due 03/2023. I will order this at 4-week follow-up.   Discussed germline testing. Will order for next visit in 4 weeks. Will refer to genetic counseling, if indicated.   Continued surveillance visits every 3 months for first 2 years  Repeat CT Chest and MRI abdomen and Pelvis in 6 months   Repeat colonoscopy due 08/2023       # Patchy Lung Opacities  -- Follow up CT chest 08/13/22 Decreased mediastinal and hilar lymphadenopathy, compatible with improved CLL     # Iron Deficiency Anemia  -- He was noted to have iron deficiency in 2014 and has  since received IV iron in the past and has been on oral iron 1 pill/day.   -- iron deficiency anemia/ACD in 07/2019 with hb down to 8.7 and ferritin 78, iron sat 12%  -- now s/p 1020 mg ferheme 07/2019 and early 10/2018 with hb improvement to 10.9 today.   --EGD and colonoscopy with Dr. Loreta Abbott 01/2020 revealed bleeding gastric ulcer, smaller antral ulcer with old blood. .  Currently being managed with sucralfate.  -- In June 2023 hemoglobin was down to 8.3.  He was given additional IV iron with Feraheme 510 mg x 2 doses  -- Seen by Gi and planned for EGD , pending cardiology clearance  -- S/p feraheme 510 mg x 2 doses 9/28-10/9  --S/p IV iron 08/17/2022 and 08/27/2022  -- Hemoglobin 01/2023 11.7, improved to 12.3 today.     # Hypogammaglobulinemia   --History level continues to be low however he has not had any more infections since we started him on IVIG. Before we started him on IVIG he has had multiple hospitalizations for pneumonias.  --Therefore we will continue IVIG for now every 3 months as he is tolerated this well. Next dose due 03/2023.        # Diastolic CHF with volume overload  -- Switched to bumex as prescribed by cardiology  -- Diuresis has bee associated with some degree of renal dysfunction in the last 5-6 months  -- Continue close follow-up with Cardiology    # CKD Stage IV  -- Following closely with [facility].  -- Cr stable, 1.5 today       Disposition:  RTC in 4 weeks for labs with germline testing +natera/exam/IVIG    Interval History   Christian Abbott presents for 4-week follow-up of chronic lymphocytic leukemia . Accompanied by daughter.     C/w calquence. Received IVIG last on 01/10/2023. MRI Abdomen 02/18/2023 shows no evidence of metastatic disease in the abdomen   CT Chest Wo Contrast 02/18/2023 shows no evidence for new or progressive intrathoracic malignancy. Improved bibasal aeration with decreased juxtapleural reticular densities. Clustered reticulonodular densities within the lateral right middle and lower lobes, presumably infectious/inflammatory.     Today, he reports improved energy levels. New increased gas with increased stools up to 3-4 times daily. Ambulates with cane today.  ECOG PS 2.       Oncology History:  Hematology/Oncology History Overview Note   88 y/o male with long-standing history of chronic lymphocytic leukemia initially diagnosed in 1998  Status post Rituxan based therapy for 8 weeks during that time  History of anemia treated with Procrit off and on  History of hypertension diabetes asthma hypercholesterolemia.  History of hospitalization in January 2014 for bilateral pneumonia [facility] for 3 weeks.     CLL (chronic lymphocytic leukemia) (CMS-HCC)   07/18/1997 Initial Diagnosis    CLL (chronic lymphocytic leukemia)     Colon adenocarcinoma (CMS-HCC)   09/10/2022 -  Cancer Staged    Staging form: Colon and Rectum, AJCC 8th Edition  - Pathologic stage from 09/10/2022: pT4, pN0, cM0 - Signed by Elyse Hsu, MD on 11/03/2022       11/03/2022 Initial Diagnosis    Colon adenocarcinoma (CMS-HCC)         Past Medical History  Patient Active Problem List   Diagnosis    CLL (chronic lymphocytic leukemia) (CMS-HCC)    Iron deficiency anemia    Hypogammaglobulinemia (CMS-HCC)    Anemia    Need for prophylactic immunotherapy  Colon adenocarcinoma (CMS-HCC)     Past Surgical History   has a past surgical history that includes Colonoscopy (2021); Esophagogastroduodenoscopy; Inguinal hernia repair; Cervical spine surgery (2012); Coronary angioplasty with stent (2014); Cataract extraction, bilateral; pr upper gi endoscopy,w/dir submuc inj (N/A, 08/10/2022); pr upper gi endoscopy,biopsy (N/A, 08/10/2022); and pr lap,surg,colectomy,w/remvl term ileum (N/A, 09/10/2022).    Allergies:   has No Known Allergies.      Medications:   has a current medication list which includes the following prescription(s): bumetanide, acalabrutinib, albuterol 2.5 mg /3 mL (0.083 %) Nebu 3 mL, albuterol 5 mg/mL Nebu 0.5 mL, atorvastatin, bumetanide, cholecalciferol (vitamin d3-125 mcg (5,000 unit)), clopidogrel, denta 5000 plus, doxazosin, empagliflozin, famotidine, folic acid, glipizide, glipizide, glucosamine-chondroitin, hydralazine, ipratropium-albuterol, victoza 2-pak, metoprolol succinate, dulera, potassium chloride, spironolactone, sucralfate, vit c/e/zn/coppr/lutein/zeaxan, and zafirlukast.    Family History/Social History:  Reviewed and updated as appropriate.    Review of Systems:   As per interval history.  All other systems reviewed and negative.    Physical Exam:   Vitals: BP 134/63  - Pulse 60  - Temp 36.3 ??C (97.3 ??F) (Temporal)  - Resp 16  - Ht 165.1 cm (5' 5)  - Wt 63.5 kg (140 lb)  - SpO2 98%  - BMI 23.30 kg/m??   General:  Pleasant, no acute distress.  ECOG PS 2.   Pain:  Pain Evaluation:                                   Pain Score (0 - 1):                                Pain Location:                                       Eyes:  Appears normal.   HENT:  Atraumatic.   Neck:  thyroid midline.  Lymphatics: Bilateral small cervical lymph nodes.  No supraclavicular axillary or inguinal lymphadenopathy   Cardiovascular:   RRR, trace edema b/l ankles.   Respiratory:  Unlabored.  Good air entry b/l. No wheezing.  Mild crackles at the left lower base.    Gastrointestinal: Does not appear distended.  Nontender. No palpable HSM.   Skin:  No rashes or subcutaneous nodules.    Musculoskeletal:  Gait is steady with cane.   Psychiatric:  Affect appropriate. Judgment and insight nl.  Neurologic:  Alert and oriented x3. Grossly non-focal.            Pertinent Studies/Imaging     MRI Abdomen 02/18/2023  Impression: No evidence of metastatic disease in the abdomen     CT Chest Wo Contrast 02/18/2023   Impression: No evidence for new or progressive intrathoracic malignancy. Improved bibasal aeration with decreased juxtapleural reticular densities. Clustered reticulonodular densities within the lateral right middle and lower lobes, presumably infectious/inflammatory.     Labs:   Results for orders placed or performed in visit on 03/17/23   Comprehensive Metabolic Panel   Result Value Ref Range    Sodium 138 135 - 145 mmol/L    Potassium 3.7 3.5 - 5.0 mmol/L    Chloride 104 98 - 107 mmol/L    CO2 26.1 21.0 - 32.0 mmol/L    Anion Gap 8 3 -  11 mmol/L    BUN 42 (H) 8 - 20 mg/dL    Creatinine 0.27 (H) 0.80 - 1.30 mg/dL    BUN/Creatinine Ratio 28     eGFR CKD-EPI (2021) Male 45 (L) >=60 mL/min/1.58m2    Glucose 229 (H) 70 - 179 mg/dL    Calcium 8.7 8.5 - 25.3 mg/dL    Albumin 3.2 (L) 3.5 - 5.0 g/dL    Total Protein 6.0 6.0 - 8.0 g/dL    Total Bilirubin 0.4 0.2 - 1.0 mg/dL    AST 17 15 - 40 U/L    ALT 24 12 - 78 U/L    Alkaline Phosphatase 153 (H) 46 - 116 U/L   Lactate dehydrogenase   Result Value Ref Range    LDH 218 81 - 234 U/L   CBC w/ Differential   Result Value Ref Range    Results Verified by Slide Scan Slide Reviewed     WBC 8.0 3.6 - 11.2 10*9/L    RBC 4.60 4.26 - 5.60 10*12/L    HGB 12.3 (L) 12.9 - 16.5 g/dL    HCT 66.4 (L) 40.3 - 48.0 %    MCV 82.5 77.6 - 95.7 fL    MCH 26.8 25.9 - 32.4 pg    MCHC 32.5 32.0 - 36.0 g/dL    RDW 47.4 (H) 25.9 - 15.2 %    MPV 8.2 6.8 - 10.7 fL    Platelet 97 (L) 150 - 450 10*9/L    Neutrophils % 45.2 %    Lymphocytes % 45.3 %    Monocytes % 8.1 %    Eosinophils % 0.7 %    Basophils % 0.7 %    Absolute Neutrophils 3.6 1.8 - 7.8 10*9/L    Absolute Lymphocytes 3.6 1.1 - 3.6 10*9/L    Absolute Monocytes 0.6 0.3 - 0.8 10*9/L    Absolute Eosinophils 0.1 0.0 - 0.5 10*9/L    Absolute Basophils 0.1 0.0 - 0.1 10*9/L   Morphology Review   Result Value Ref Range    Neutrophil Left Shift Present (A) Not Present       I personally spent ***  minutes face-to-face and non-face-to-face in the care of this patient, which includes all pre, intra, and post visit time on the date of service.    This document serves as a record of the services and decisions performed by Elyse Hsu, MD, on 03/17/2023. It was created on his behalf by Reine Just, a trained medical scribe. The creation of this document is based on the provider's statements and observations that were conveyed to the medical scribe during the patient's encounter.     The patient reports that all of his questions were answered to his satisfaction today. he was encouraged to contact us for any questions that may arise prior to his next visit.Thank you for the opportunity to participate in Shloimy Freehling's care.       Elyse Hsu, MD

## 2023-03-22 NOTE — Unmapped (Signed)
[facility] Established Patient Note     Patient Name: Christian Abbott   Date of Birth: 1935/01/06   Date of Encounter: 03/17/2023     PCP:  Arturo Morton, MD      Assessment and Plan    # Chronic Lymphocytic Leukemia  --Rai stage IV CLL  --CLL FISH panel shows 13 q. deletion 21.5%, IGHV mutated -good prognostic features  --11/05/2020: TP53 mutation negative.  NOTCH1 negative.  SF3B1 positive.  SF3B1 positivity indicates poor prognosis.  -- no evidence of hemolysis.   --Indication for treatment:  advanced stage and history of thrombocytopenia  -- He has been treated with Rituxan many years ago by Dr Thedore Mins, with good response in the 1990s. Treatment indication at that time may have been frequent infections.  -- Started Acalabrutinib 12/19/20 - held 1 week prior to surgery on 09/10/22 and restarted on 10/09/2022.  -- Reviewed CBC, CMP. ALC improved.  Platelets declined to 97. Platelet decline likely due to bone marrow suppression on Acalabrutinib      PLAN:  -- Continue Acalabrutinib 100 mg twice daily 10/09/2022.  -- Last IVIG dose given 01/10/2023. Next dose IVIG due 03/2023.        #Stage II Colon Adenocarcinoma  - Mucinous Features [pT4N0M0]  - Colonoscopy for evaluation of iron deficiency anemia revealed a transverse colon lesion consistent with adenocarcinoma --MMR deficient, MLH1 and PMS2 absent expression.  BRAF negative  MLH1 hypermethylation + c/with sporadic MMR-deficient colon adenoCA  -- Staging CT chest without contrast and CT abdomen and pelvis with contrast scheduled for today afternoon  -- 09/10/22 Partial colectomy   -- reviewed pathology in detail and discussed [facility] colon cancer nomogram which shows a >90 % 3 year and 5 year PFS   -- I explained the inherent resistance to 12fu with MMR-deficient colon cancers and I would not recommend adjuvant FOLFOX for him given his age ,and comorbidities. His PS is recovering well after surgery however he is still quite frail. PS 2.   -- Reviewed ctDNA results 10/01/22 low positive 0.02-->10/19/22  negative (0)  -- Although initial test was positive - this test was very low positive and ~ 3w later on 2/20 repeat testing was negative. This suggests low risk of distant metastasis.   -- Given age, comorbidites would recommend against Adjuvant FOLFOX.   -- I reviewed MRI abdomen - due to secondary hemachromatosis nonspecific liver lesions remained indeterminate. Repeat MRI Abdomen 02/18/2023 shows no evidence of metastatic disease in the abdomen   -- CT Chest Wo Contrast 02/18/2023 shows no evidence for new or progressive intrathoracic malignancy. Improved bibasal aeration with decreased juxtapleural reticular densities. Clustered reticulonodular densities within the lateral right middle and lower lobes, presumably infectious/inflammatory.       PLAN:  Recent CT DNA testing 12/2022 remained negative. Will continue routine surveillance CT DNA testing via [facility] mobile [facility] - due 03/2023. I will order this at 4-week follow-up.   Discussed germline testing. Will order for next visit in 4 weeks. Will refer to genetic counseling, if indicated.   Continued surveillance visits every 3 months for first 2 years  Repeat CT Chest and MRI abdomen and Pelvis in 6 months  ~ 07/2023 . Order at next visit   Repeat colonoscopy due 08/2023       # Patchy Lung Opacities  -- Follow up CT chest 08/13/22 Decreased mediastinal and hilar lymphadenopathy, compatible with improved CLL     # Iron Deficiency Anemia  -- He was noted  to have iron deficiency in 2014 and has since received IV iron in the past and has been on oral iron 1 pill/day.   -- iron deficiency anemia/ACD in 07/2019 with hb down to 8.7 and ferritin 78, iron sat 12%  -- now s/p 1020 mg ferheme 07/2019 and early 10/2018 with hb improvement to 10.9 today.   --EGD and colonoscopy with Dr. Loreta Ave 01/2020 revealed bleeding gastric ulcer, smaller antral ulcer with old blood. .  Currently being managed with sucralfate.  -- In June 2023 hemoglobin was down to 8.3.  He was given additional IV iron with Feraheme 510 mg x 2 doses  -- Seen by Gi and planned for EGD , pending cardiology clearance  -- S/p feraheme 510 mg x 2 doses 9/28-10/9  --S/p IV iron 08/17/2022 and 08/27/2022  -- Hemoglobin 01/2023 11.7, improved to 12.3 today.     # Hypogammaglobulinemia   --History level continues to be low however he has not had any more infections since we started him on IVIG. Before we started him on IVIG he has had multiple hospitalizations for pneumonias.  --Therefore we will continue IVIG for now every 3 months as he is tolerated this well. Next dose due 03/2023.        # Diastolic CHF with volume overload  -- Switched to bumex as prescribed by cardiology  -- Diuresis has bee associated with some degree of renal dysfunction in the last 5-6 months  -- Continue close follow-up with Cardiology    # CKD Stage IV  -- Following closely with [facility].  -- Cr stable, 1.5 today       Disposition:  RTC in 4 weeks for labs with germline testing +natera/exam/IVIG    Interval History   [name] presents for 4-week follow-up of chronic lymphocytic leukemia . Accompanied by daughter.     C/w calquence. Received IVIG last on 01/10/2023. MRI Abdomen 02/18/2023 shows no evidence of metastatic disease in the abdomen   CT Chest Wo Contrast 02/18/2023 shows no evidence for new or progressive intrathoracic malignancy. Improved bibasal aeration with decreased juxtapleural reticular densities. Clustered reticulonodular densities within the lateral right middle and lower lobes, presumably infectious/inflammatory.     Today, he reports improved energy levels. New increased gas with increased stools up to 3-4 times daily. Ambulates with cane today.  ECOG PS 2.       Oncology History:  Hematology/Oncology History Overview Note   88 y/o male with long-standing history of chronic lymphocytic leukemia initially diagnosed in 1998  Status post Rituxan based therapy for 8 weeks during that time  History of anemia treated with Procrit off and on  History of hypertension diabetes asthma hypercholesterolemia.  History of hospitalization in January 2014 for bilateral pneumonia [facility] for 3 weeks.     CLL (chronic lymphocytic leukemia) (CMS-HCC)   07/18/1997 Initial Diagnosis    CLL (chronic lymphocytic leukemia)     Colon adenocarcinoma (CMS-HCC)   09/10/2022 -  Cancer Staged    Staging form: Colon and Rectum, AJCC 8th Edition  - Pathologic stage from 09/10/2022: pT4, pN0, cM0 - Signed by Elyse Hsu, MD on 11/03/2022       11/03/2022 Initial Diagnosis    Colon adenocarcinoma (CMS-HCC)         Past Medical History  Patient Active Problem List   Diagnosis    CLL (chronic lymphocytic leukemia) (CMS-HCC)    Iron deficiency anemia    Hypogammaglobulinemia (CMS-HCC)    Anemia  Need for prophylactic immunotherapy    Colon adenocarcinoma (CMS-HCC)     Past Surgical History   has a past surgical history that includes Colonoscopy (2021); Esophagogastroduodenoscopy; Inguinal hernia repair; Cervical spine surgery (2012); Coronary angioplasty with stent (2014); Cataract extraction, bilateral; pr upper gi endoscopy,w/dir submuc inj (N/A, 08/10/2022); pr upper gi endoscopy,biopsy (N/A, 08/10/2022); and pr lap,surg,colectomy,w/remvl term ileum (N/A, 09/10/2022).    Allergies:   has No Known Allergies.      Medications:   has a current medication list which includes the following prescription(s): bumetanide, acalabrutinib, albuterol 2.5 mg /3 mL (0.083 %) Nebu 3 mL, albuterol 5 mg/mL Nebu 0.5 mL, atorvastatin, bumetanide, cholecalciferol (vitamin d3-125 mcg (5,000 unit)), clopidogrel, denta 5000 plus, doxazosin, empagliflozin, famotidine, folic acid, glipizide, glipizide, glucosamine-chondroitin, hydralazine, ipratropium-albuterol, victoza 2-pak, metoprolol succinate, dulera, potassium chloride, spironolactone, sucralfate, vit c/e/zn/coppr/lutein/zeaxan, and zafirlukast.    Family History/Social History:  Reviewed and updated as appropriate.    Review of Systems:   As per interval history.  All other systems reviewed and negative.    Physical Exam:   Vitals: BP 134/63  - Pulse 60  - Temp 36.3 ??C (97.3 ??F) (Temporal)  - Resp 16  - Ht 165.1 cm (5' 5)  - Wt 63.5 kg (140 lb)  - SpO2 98%  - BMI 23.30 kg/m??   General:  Pleasant, no acute distress.  ECOG PS 2.   Pain:  Pain Evaluation:                                   Pain Score (0 - 1):                                Pain Location:                                       Eyes:  Appears normal.   HENT:  Atraumatic.   Neck:  thyroid midline.  Lymphatics: Bilateral small cervical lymph nodes.  No supraclavicular axillary or inguinal lymphadenopathy   Cardiovascular:   RRR, trace edema b/l ankles.   Respiratory:  Unlabored.  Good air entry b/l. No wheezing.  Mild crackles at the left lower base.    Gastrointestinal: Does not appear distended.  Nontender. No palpable HSM.   Skin:  No rashes or subcutaneous nodules.    Musculoskeletal:  Gait is steady with cane.   Psychiatric:  Affect appropriate. Judgment and insight nl.  Neurologic:  Alert and oriented x3. Grossly non-focal.            Pertinent Studies/Imaging     MRI Abdomen 02/18/2023  Impression: No evidence of metastatic disease in the abdomen     CT Chest Wo Contrast 02/18/2023   Impression: No evidence for new or progressive intrathoracic malignancy. Improved bibasal aeration with decreased juxtapleural reticular densities. Clustered reticulonodular densities within the lateral right middle and lower lobes, presumably infectious/inflammatory.     Labs:   Results for orders placed or performed in visit on 03/17/23   Comprehensive Metabolic Panel   Result Value Ref Range    Sodium 138 135 - 145 mmol/L    Potassium 3.7 3.5 - 5.0 mmol/L    Chloride 104 98 - 107 mmol/L    CO2 26.1 21.0 - 32.0 mmol/L  Anion Gap 8 3 - 11 mmol/L    BUN 42 (H) 8 - 20 mg/dL    Creatinine 1.61 (H) 0.80 - 1.30 mg/dL BUN/Creatinine Ratio 28     eGFR CKD-EPI (2021) Male 45 (L) >=60 mL/min/1.79m2    Glucose 229 (H) 70 - 179 mg/dL    Calcium 8.7 8.5 - 09.6 mg/dL    Albumin 3.2 (L) 3.5 - 5.0 g/dL    Total Protein 6.0 6.0 - 8.0 g/dL    Total Bilirubin 0.4 0.2 - 1.0 mg/dL    AST 17 15 - 40 U/L    ALT 24 12 - 78 U/L    Alkaline Phosphatase 153 (H) 46 - 116 U/L   Lactate dehydrogenase   Result Value Ref Range    LDH 218 81 - 234 U/L   CBC w/ Differential   Result Value Ref Range    Results Verified by Slide Scan Slide Reviewed     WBC 8.0 3.6 - 11.2 10*9/L    RBC 4.60 4.26 - 5.60 10*12/L    HGB 12.3 (L) 12.9 - 16.5 g/dL    HCT 04.5 (L) 40.9 - 48.0 %    MCV 82.5 77.6 - 95.7 fL    MCH 26.8 25.9 - 32.4 pg    MCHC 32.5 32.0 - 36.0 g/dL    RDW 81.1 (H) 91.4 - 15.2 %    MPV 8.2 6.8 - 10.7 fL    Platelet 97 (L) 150 - 450 10*9/L    Neutrophils % 45.2 %    Lymphocytes % 45.3 %    Monocytes % 8.1 %    Eosinophils % 0.7 %    Basophils % 0.7 %    Absolute Neutrophils 3.6 1.8 - 7.8 10*9/L    Absolute Lymphocytes 3.6 1.1 - 3.6 10*9/L    Absolute Monocytes 0.6 0.3 - 0.8 10*9/L    Absolute Eosinophils 0.1 0.0 - 0.5 10*9/L    Absolute Basophils 0.1 0.0 - 0.1 10*9/L   Morphology Review   Result Value Ref Range    Neutrophil Left Shift Present (A) Not Present       I personally spent 30 minutes face-to-face and non-face-to-face in the care of this patient, which includes all pre, intra, and post visit time on the date of service.    This document serves as a record of the services and decisions performed by Elyse Hsu, MD, on 03/17/2023. It was created on his behalf by Reine Just, a trained medical scribe. The creation of this document is based on the provider's statements and observations that were conveyed to the medical scribe during the patient's encounter.     The patient reports that all of his questions were answered to his satisfaction today. he was encouraged to contact us for any questions that may arise prior to his next visit.Thank you for the opportunity to participate in Abrar Tomkins's care.       Elyse Hsu, MD

## 2023-03-31 DIAGNOSIS — D801 Nonfamilial hypogammaglobulinemia: Principal | ICD-10-CM

## 2023-04-01 NOTE — Unmapped (Signed)
[facility] Refill Coordination Note    Specialty Medication(s) to be Shipped:   Hematology/Oncology: Calquence 100mg     Other medication(s) to be shipped: No additional medications requested for fill at this time     Christian Abbott, DOB: 1935-03-05  Phone: [phone] (home)       All above HIPAA information was verified with  patient's daughter      Was a Nurse, learning disability used for this call? No    Completed refill call assessment today to schedule patient's medication shipment from the [facility] [phone]).  All relevant notes have been reviewed.     Specialty medication(s) and dose(s) confirmed: Regimen is correct and unchanged.   Changes to medications: Dub reports no changes at this time.  Changes to insurance: No  New side effects reported not previously addressed with a pharmacist or physician: None reported  Questions for the pharmacist: No    Confirmed patient received a Conservation officer, historic buildings and a Surveyor, mining with first shipment. The patient will receive a drug information handout for each medication shipped and additional FDA Medication Guides as required.       DISEASE/MEDICATION-SPECIFIC INFORMATION        N/A    SPECIALTY MEDICATION ADHERENCE     Medication Adherence    Patient reported X missed doses in the last month: 0  Specialty Medication: CALQUENCE 100 mg  Patient is on additional specialty medications: No  Patient is on more than two specialty medications: No  Any gaps in refill history greater than 2 weeks in the last 3 months: no  Demonstrates understanding of importance of adherence: yes              Were doses missed due to medication being on hold? No    Calquence 100 mg: 7-10  days of medicine on hand       REFERRAL TO PHARMACIST     Referral to the pharmacist: Not needed      [facility]     Shipping address confirmed in Epic.     Delivery Scheduled: Yes, Expected medication delivery date: 04/08/23 .     Medication will be delivered via Next Day Courier to the prescription address in Epic WAM.    Ricci Barker   [facility] Specialty Technician

## 2023-04-07 MED FILL — CALQUENCE (ACALABRUTINIB MALEATE) 100 MG TABLET: ORAL | 30 days supply | Qty: 60 | Fill #1

## 2023-04-14 DIAGNOSIS — C189 Malignant neoplasm of colon, unspecified: Principal | ICD-10-CM

## 2023-04-14 DIAGNOSIS — C9110 Chronic lymphocytic leukemia of B-cell type not having achieved remission: Principal | ICD-10-CM

## 2023-04-14 NOTE — Unmapped (Cosign Needed)
[facility] Established Patient Note     Patient Name: Christian Abbott   Date of Birth: 1935-07-25   Date of Encounter: 04/15/2023     PCP:  Arturo Morton, MD    Assessment and Plan    # Chronic Lymphocytic Leukemia  --Rai stage IV CLL  --CLL FISH panel shows 13 q. deletion 21.5%, IGHV mutated -good prognostic features  --11/05/2020: TP53 mutation negative.  NOTCH1 negative.  SF3B1 positive.  SF3B1 positivity indicates poor prognosis.  -- no evidence of hemolysis.   --Indication for treatment:  advanced stage and history of thrombocytopenia  -- He has been treated with Rituxan many years ago by Dr Thedore Mins, with good response in the 1990s. Treatment indication at that time may have been frequent infections.  -- Started Acalabrutinib 12/19/20 - held 1 week prior to surgery on 09/10/22 and restarted on 10/09/2022.  -- Reviewed CBC, CMP.  ALC low nml.  Platelets 106. Platelet decline likely due to bone marrow suppression on Acalabrutinib      PLAN:  -- Continue Acalabrutinib 100 mg twice daily 10/09/2022.  -- IVIG due today (03/2023), next dose 07/2023.        #Stage II Colon Adenocarcinoma  - Mucinous Features [pT4N0M0]  - Colonoscopy for evaluation of iron deficiency anemia revealed a transverse colon lesion consistent with adenocarcinoma --MMR deficient, MLH1 and PMS2 absent expression.  BRAF negative  MLH1 hypermethylation + c/with sporadic MMR-deficient colon adenoCA  -- Staging CT chest without contrast and CT abdomen and pelvis with contrast scheduled for today afternoon  -- 09/10/22 Partial colectomy   -- reviewed pathology in detail and discussed [facility] colon cancer nomogram which shows a >90 % 3 year and 5 year PFS   -- I explained the inherent resistance to 31fu with MMR-deficient colon cancers and I would not recommend adjuvant FOLFOX for him given his age ,and comorbidities. His PS is recovering well after surgery however he is still quite frail. PS 2.   -- Reviewed ctDNA results 10/01/22 low positive 0.02-->10/19/22  negative (0)  -- Although initial test was positive - this test was very low positive and ~ 3w later on 2/20 repeat testing was negative. This suggests low risk of distant metastasis.   -- Given age, comorbidites would recommend against Adjuvant FOLFOX.   -- I reviewed MRI abdomen - due to secondary hemachromatosis nonspecific liver lesions remained indeterminate. Repeat MRI Abdomen 02/18/2023 shows no evidence of metastatic disease in the abdomen   -- CT Chest Wo Contrast 02/18/2023 shows no evidence for new or progressive intrathoracic malignancy. Improved bibasal aeration with decreased juxtapleural reticular densities. Clustered reticulonodular densities within the lateral right middle and lower lobes, presumably infectious/inflammatory.   -- CEA pending       PLAN:  Recent CT DNA testing 12/2022 remained negative. Will continue routine surveillance CT DNA testing via [facility] - due 03/2023. Dtr will call to arrange.   Discussed germline testing. Blood draw completed today. Will refer to genetic counseling, if indicated.   Continued surveillance visits every 3 months for first 2 years  Repeat CT Chest and MRI abdomen and Pelvis in 6 months  ~ 07/2023 . Order at next visit   Repeat colonoscopy due 08/2023       # Patchy Lung Opacities  -- Follow up CT chest 08/13/22 Decreased mediastinal and hilar lymphadenopathy, compatible with improved CLL     # Iron Deficiency Anemia  -- He was noted to have iron deficiency in 2014  and has since received IV iron in the past and has been on oral iron 1 pill/day.   -- iron deficiency anemia/ACD in 07/2019 with hb down to 8.7 and ferritin 78, iron sat 12%  -- now s/p 1020 mg ferheme 07/2019 and early 10/2018 with hb improvement to 10.9 today.   --EGD and colonoscopy with Dr. Loreta Ave 01/2020 revealed bleeding gastric ulcer, smaller antral ulcer with old blood. .  Currently being managed with sucralfate.  -- In June 2023 hemoglobin was down to 8.3.  He was given additional IV iron with Feraheme 510 mg x 2 doses  -- Seen by Gi and planned for EGD , pending cardiology clearance  -- S/p feraheme 510 mg x 2 doses 9/28-10/9  --S/p IV iron 08/17/2022 and 08/27/2022  -- Hemoglobin 01/2023 11.7, stable at 12.3 today.       # Hypogammaglobulinemia   --History level continues to be low however he has not had any more infections since we started him on IVIG. Before we started him on IVIG he has had multiple hospitalizations for pneumonias.  --Therefore we will continue IVIG for now every 3 months as he is tolerated this well. Next dose due 07/2023.        # Diastolic CHF with volume overload  -- Switched to bumex as prescribed by cardiology  -- Diuresis has bee associated with some degree of renal dysfunction in the last 5-6 months    -- Continue close follow-up with Cardiology    # CKD Stage IV  -- Following closely with [facility].  -- Cr stable, 1.53 today   -- Reviewed adequate PO water intake     Disposition:  RTC in 4 weeks for labs/exam (MD)     Interval History     Christian Abbott presents for 4-week follow-up of chronic lymphocytic leukemia. Accompanied by daughter. He is doing well with no new concerns today.   He follows closely with nephrology, cardiology and Pulmonary and has upcoming appts.     Today, he reports that his energy levels are stable. No recent fevers, chill, night sweats. No new lumps noted.   Denies CP, palpitation, cough, sob. Denies abd pain, N/V. Bowels nml. Denies HA, vision change, dizziness or new bone pain.    Ambulates with cane. Appetite stable. ECOG 2.   ________________    C/w calquence. Received IVIG last on 01/10/2023. MRI Abdomen 02/18/2023 shows no evidence of metastatic disease in the abdomen   CT Chest Wo Contrast 02/18/2023 shows no evidence for new or progressive intrathoracic malignancy. Improved bibasal aeration with decreased juxtapleural reticular densities. Clustered reticulonodular densities within the lateral right middle and lower lobes, presumably infectious/inflammatory.       Oncology History:  Hematology/Oncology History Overview Note   88 y/o male with long-standing history of chronic lymphocytic leukemia initially diagnosed in 1998  Status post Rituxan based therapy for 8 weeks during that time  History of anemia treated with Procrit off and on  History of hypertension diabetes asthma hypercholesterolemia.  History of hospitalization in January 2014 for bilateral pneumonia [facility] for 3 weeks.     CLL (chronic lymphocytic leukemia) (CMS-HCC)   07/18/1997 Initial Diagnosis    CLL (chronic lymphocytic leukemia)     Colon adenocarcinoma (CMS-HCC)   09/10/2022 -  Cancer Staged    Staging form: Colon and Rectum, AJCC 8th Edition  - Pathologic stage from 09/10/2022: pT4, pN0, cM0 - Signed by Elyse Hsu, MD on 11/03/2022  11/03/2022 Initial Diagnosis    Colon adenocarcinoma (CMS-HCC)         Past Medical History  Patient Active Problem List   Diagnosis    CLL (chronic lymphocytic leukemia) (CMS-HCC)    Iron deficiency anemia    Hypogammaglobulinemia (CMS-HCC)    Anemia    Need for prophylactic immunotherapy    Colon adenocarcinoma (CMS-HCC)     Past Surgical History   has a past surgical history that includes Colonoscopy (2021); Esophagogastroduodenoscopy; Inguinal hernia repair; Cervical spine surgery (2012); Coronary angioplasty with stent (2014); Cataract extraction, bilateral; pr upper gi endoscopy,w/dir submuc inj (N/A, 08/10/2022); pr upper gi endoscopy,biopsy (N/A, 08/10/2022); and pr lap,surg,colectomy,w/remvl term ileum (N/A, 09/10/2022).    Allergies:   has No Known Allergies.      Medications:   has a current medication list which includes the following prescription(s): accu-chek guide test strips, tresiba flextouch u-100, acalabrutinib, albuterol 2.5 mg /3 mL (0.083 %) Nebu 3 mL, albuterol 5 mg/mL Nebu 0.5 mL, atorvastatin, bumetanide, bumetanide, cholecalciferol (vitamin d3-125 mcg (5,000 unit)), clopidogrel, denta 5000 plus, doxazosin, empagliflozin, famotidine, folic acid, glipizide, glipizide, glucosamine-chondroitin, hydralazine, ipratropium-albuterol, victoza 2-pak, metoprolol succinate, dulera, potassium chloride, spironolactone, sucralfate, vit c/e/zn/coppr/lutein/zeaxan, and zafirlukast, and the following Facility-Administered Medications: sodium chloride.    Family History/Social History:  Reviewed and updated as appropriate.    Review of Systems:   As per interval history.  All other systems reviewed and negative.    Physical Exam:     Vitals: BP 129/61  - Pulse 68  - Temp 36.3 ??C (97.3 ??F) (Temporal)  - Resp 16  - Ht 165.1 cm (5' 5)  - Wt 64 kg (141 lb 1.6 oz)  - SpO2 99%  - BMI 23.48 kg/m??   General:  Pleasant, no acute distress.  ECOG PS 2.   Pain:  Pain Evaluation:                                   Pain Score (0 - 1):                                Pain Location:                                       HEENT:  Atraumatic.   Neck:  thyroid midline. No swelling   Lymphatics: No palpable cervical, supraclavicular, inguinal or axillary nodes   Cardiovascular:  + murmur,  trace edema b/l ankles.   Respiratory:  Unlabored.  Good air entry b/l. No wheezing.  Mild crackles at the left lower base.    Gastrointestinal: Non distended.  Non tender. No palpable HSM.   Skin:  No rashes or subcutaneous nodules.    Musculoskeletal:  Gait is steady with cane.   Psychiatric:  Affect appropriate. Judgment and insight nl.  Neurologic:  Alert and oriented x3. Grossly non-focal.            Pertinent Studies/Imaging     MRI Abdomen 02/18/2023  Impression: No evidence of metastatic disease in the abdomen     CT Chest Wo Contrast 02/18/2023   Impression: No evidence for new or progressive intrathoracic malignancy. Improved bibasal aeration with decreased juxtapleural reticular densities. Clustered reticulonodular densities within the lateral right middle and lower lobes, presumably infectious/inflammatory.  Labs: Results for orders placed or performed in visit on 04/15/23   Comprehensive Metabolic Panel   Result Value Ref Range    Sodium 138 135 - 145 mmol/L    Potassium 3.9 3.5 - 5.0 mmol/L    Chloride 102 98 - 107 mmol/L    CO2 26.8 21.0 - 32.0 mmol/L    Anion Gap 9 3 - 11 mmol/L    BUN 42 (H) 8 - 20 mg/dL    Creatinine 1.61 (H) 0.80 - 1.30 mg/dL    BUN/Creatinine Ratio 27     eGFR CKD-EPI (2021) Male 44 (L) >=60 mL/min/1.7m2    Glucose 190 (H) 70 - 179 mg/dL    Calcium 8.5 8.5 - 09.6 mg/dL    Albumin 3.3 (L) 3.5 - 5.0 g/dL    Total Protein 5.8 (L) 6.0 - 8.0 g/dL    Total Bilirubin 0.5 0.2 - 1.0 mg/dL    AST 20 15 - 40 U/L    ALT 23 12 - 78 U/L    Alkaline Phosphatase 148 (H) 46 - 116 U/L   Lactate dehydrogenase   Result Value Ref Range    LDH 235 (H) 81 - 234 U/L   CBC w/ Differential   Result Value Ref Range    WBC 6.0 3.6 - 11.2 10*9/L    RBC 4.52 4.26 - 5.60 10*12/L    HGB 12.3 (L) 12.9 - 16.5 g/dL    HCT 04.5 (L) 40.9 - 48.0 %    MCV 81.9 77.6 - 95.7 fL    MCH 27.3 25.9 - 32.4 pg    MCHC 33.3 32.0 - 36.0 g/dL    RDW 81.1 (H) 91.4 - 15.2 %    MPV 7.8 6.8 - 10.7 fL    Platelet 106 (L) 150 - 450 10*9/L    Neutrophils % 65.8 %    Lymphocytes % 24.0 %    Monocytes % 8.5 %    Eosinophils % 0.7 %    Basophils % 1.0 %    Absolute Neutrophils 4.0 1.8 - 7.8 10*9/L    Absolute Lymphocytes 1.4 1.1 - 3.6 10*9/L    Absolute Monocytes 0.5 0.3 - 0.8 10*9/L    Absolute Eosinophils 0.0 0.0 - 0.5 10*9/L    Absolute Basophils 0.1 0.0 - 0.1 10*9/L       I personally spent 30 minutes face-to-face and non-face-to-face in the care of this patient, which includes all pre, intra, and post visit time on the date of service.    The patient reports that all of his questions were answered to his satisfaction today. he was encouraged to contact us for any questions that may arise prior to his next visit.Thank you for the opportunity to participate in Baran Fodge's care.

## 2023-04-14 NOTE — Unmapped (Signed)
Elyse Hsu, MD  Jesse Fall, RN  Pelase also order [facility] germline panel for this patient for tomorrow.    [facility] ordere online; uploaded patient's insurance info and progress note from 03/17/23.    Order ID is [id number].     Appt note added stating need for [facility] testing on 04/15/23.    Email sent to Gabriel Rung with [facility] asking her to confirm that patient is scheduled with mobile phlebotomy for next test due 04/29/23; response pending.

## 2023-04-15 ENCOUNTER — Ambulatory Visit: Admit: 2023-04-15 | Discharge: 2023-04-16 | Payer: MEDICARE

## 2023-04-15 LAB — COMPREHENSIVE METABOLIC PANEL
ALBUMIN: 3.3 g/dL — ABNORMAL LOW (ref 3.5–5.0)
ALKALINE PHOSPHATASE: 148 U/L — ABNORMAL HIGH (ref 46–116)
ALT (SGPT): 23 U/L (ref 12–78)
ANION GAP: 9 mmol/L (ref 3–11)
AST (SGOT): 20 U/L (ref 15–40)
BILIRUBIN TOTAL: 0.5 mg/dL (ref 0.2–1.0)
BLOOD UREA NITROGEN: 42 mg/dL — ABNORMAL HIGH (ref 8–20)
BUN / CREAT RATIO: 27
CALCIUM: 8.5 mg/dL (ref 8.5–10.1)
CHLORIDE: 102 mmol/L (ref 98–107)
CO2: 26.8 mmol/L (ref 21.0–32.0)
CREATININE: 1.53 mg/dL — ABNORMAL HIGH (ref 0.80–1.30)
EGFR CKD-EPI (2021) MALE: 44 mL/min/{1.73_m2} — ABNORMAL LOW (ref >=60)
GLUCOSE RANDOM: 190 mg/dL — ABNORMAL HIGH (ref 70–179)
POTASSIUM: 3.9 mmol/L (ref 3.5–5.0)
PROTEIN TOTAL: 5.8 g/dL — ABNORMAL LOW (ref 6.0–8.0)
SODIUM: 138 mmol/L (ref 135–145)

## 2023-04-15 LAB — CBC W/ AUTO DIFF
BASOPHILS ABSOLUTE COUNT: 0.1 10*9/L (ref 0.0–0.1)
BASOPHILS RELATIVE PERCENT: 1 %
EOSINOPHILS ABSOLUTE COUNT: 0 10*9/L (ref 0.0–0.5)
EOSINOPHILS RELATIVE PERCENT: 0.7 %
HEMATOCRIT: 37 % — ABNORMAL LOW (ref 39.0–48.0)
HEMOGLOBIN: 12.3 g/dL — ABNORMAL LOW (ref 12.9–16.5)
LYMPHOCYTES ABSOLUTE COUNT: 1.4 10*9/L (ref 1.1–3.6)
LYMPHOCYTES RELATIVE PERCENT: 24 %
MEAN CORPUSCULAR HEMOGLOBIN CONC: 33.3 g/dL (ref 32.0–36.0)
MEAN CORPUSCULAR HEMOGLOBIN: 27.3 pg (ref 25.9–32.4)
MEAN CORPUSCULAR VOLUME: 81.9 fL (ref 77.6–95.7)
MEAN PLATELET VOLUME: 7.8 fL (ref 6.8–10.7)
MONOCYTES ABSOLUTE COUNT: 0.5 10*9/L (ref 0.3–0.8)
MONOCYTES RELATIVE PERCENT: 8.5 %
NEUTROPHILS ABSOLUTE COUNT: 4 10*9/L (ref 1.8–7.8)
NEUTROPHILS RELATIVE PERCENT: 65.8 %
PLATELET COUNT: 106 10*9/L — ABNORMAL LOW (ref 150–450)
RED BLOOD CELL COUNT: 4.52 10*12/L (ref 4.26–5.60)
RED CELL DISTRIBUTION WIDTH: 16 % — ABNORMAL HIGH (ref 12.2–15.2)
WBC ADJUSTED: 6 10*9/L (ref 3.6–11.2)

## 2023-04-15 LAB — CEA: CARCINOEMBRYONIC ANTIGEN: 1.8 ng/mL (ref 0.0–5.0)

## 2023-04-15 LAB — LACTATE DEHYDROGENASE: LACTATE DEHYDROGENASE: 235 U/L — ABNORMAL HIGH (ref 81–234)

## 2023-04-15 MED ADMIN — immun glob G(IgG)-pro-IgA 0-50 (PRIVIGEN) 10 % intravenous solution 5 g: 5 g | INTRAVENOUS | @ 13:00:00 | Stop: 2023-04-15

## 2023-04-15 MED ADMIN — acetaminophen (TYLENOL) tablet 650 mg: 650 mg | ORAL | @ 13:00:00 | Stop: 2023-04-15

## 2023-04-15 MED ADMIN — immun glob G(IgG)-pro-IgA 0-50 (PRIVIGEN) 10 % intravenous solution 20 g: 20 g | INTRAVENOUS | @ 14:00:00 | Stop: 2023-04-15

## 2023-04-15 MED ADMIN — diphenhydrAMINE (BENADRYL) capsule/tablet 25 mg: 25 mg | ORAL | @ 13:00:00 | Stop: 2023-04-15

## 2023-04-15 MED ADMIN — sodium chloride (NS) 0.9 % infusion: 20 mL/h | INTRAVENOUS | @ 13:00:00 | Stop: 2023-04-15

## 2023-04-15 NOTE — Unmapped (Signed)
IVIG infused as ordered and tolerated well.  He denies any complaints today and was seen by provider prior to coming to infusion area.  Snack, drinks, and blankets provided.  PIV removed and patient discharged, stable, to home with daughter.

## 2023-04-29 NOTE — Unmapped (Signed)
[facility] Refill Coordination Note    Specialty Medication(s) to be Shipped:   Hematology/Oncology: Calquence 100mg     Other medication(s) to be shipped: No additional medications requested for fill at this time     Christian Abbott, DOB: 07/18/1935  Phone: [phone] (home)       All above HIPAA information was verified with  patient's daughter      Was a Nurse, learning disability used for this call? No    Completed refill call assessment today to schedule patient's medication shipment from the [facility] [phone]).  All relevant notes have been reviewed.     Specialty medication(s) and dose(s) confirmed: Regimen is correct and unchanged.   Changes to medications: Christian Abbott reports no changes at this time.  Changes to insurance: No  New side effects reported not previously addressed with a pharmacist or physician: None reported  Questions for the pharmacist: No    Confirmed patient received a Conservation officer, historic buildings and a Surveyor, mining with first shipment. The patient will receive a drug information handout for each medication shipped and additional FDA Medication Guides as required.       DISEASE/MEDICATION-SPECIFIC INFORMATION        N/A    SPECIALTY MEDICATION ADHERENCE     Medication Adherence    Patient reported X missed doses in the last month: 0  Specialty Medication: CALQUENCE 100 mg tablet (acalabrutinib)  Patient is on additional specialty medications: No  Informant: child/children              Were doses missed due to medication being on hold? No    Calquence 100 mg: 16 days of medicine on hand       REFERRAL TO PHARMACIST     Referral to the pharmacist: Not needed      [facility]     Shipping address confirmed in Epic.     Delivery Scheduled: Yes, Expected medication delivery date: 05/13/23.     Medication will be delivered via Next Day Courier to the prescription address in Epic WAM.    Christian Abbott   [facility] Specialty Technician

## 2023-05-12 MED FILL — CALQUENCE (ACALABRUTINIB MALEATE) 100 MG TABLET: ORAL | 30 days supply | Qty: 60 | Fill #2

## 2023-05-18 DIAGNOSIS — C9110 Chronic lymphocytic leukemia of B-cell type not having achieved remission: Principal | ICD-10-CM

## 2023-05-19 ENCOUNTER — Ambulatory Visit: Admit: 2023-05-19 | Discharge: 2023-05-20 | Payer: MEDICARE

## 2023-05-19 LAB — COMPREHENSIVE METABOLIC PANEL
ALBUMIN: 3 g/dL — ABNORMAL LOW (ref 3.5–5.0)
ALKALINE PHOSPHATASE: 152 U/L — ABNORMAL HIGH (ref 46–116)
ALT (SGPT): 15 U/L (ref 12–78)
ANION GAP: 6 mmol/L (ref 3–11)
AST (SGOT): 17 U/L (ref 15–40)
BILIRUBIN TOTAL: 0.4 mg/dL (ref 0.2–1.0)
BLOOD UREA NITROGEN: 44 mg/dL — ABNORMAL HIGH (ref 8–20)
BUN / CREAT RATIO: 27
CALCIUM: 9.2 mg/dL (ref 8.5–10.1)
CHLORIDE: 102 mmol/L (ref 98–107)
CO2: 27.3 mmol/L (ref 21.0–32.0)
CREATININE: 1.61 mg/dL — ABNORMAL HIGH (ref 0.80–1.30)
EGFR CKD-EPI (2021) MALE: 41 mL/min/{1.73_m2} — ABNORMAL LOW (ref >=60)
GLUCOSE RANDOM: 203 mg/dL — ABNORMAL HIGH (ref 70–179)
POTASSIUM: 4.7 mmol/L (ref 3.5–5.0)
PROTEIN TOTAL: 5.9 g/dL — ABNORMAL LOW (ref 6.0–8.0)
SODIUM: 135 mmol/L (ref 135–145)

## 2023-05-19 LAB — CBC W/ AUTO DIFF
BASOPHILS ABSOLUTE COUNT: 0.1 10*9/L (ref 0.0–0.1)
BASOPHILS RELATIVE PERCENT: 0.8 %
EOSINOPHILS ABSOLUTE COUNT: 0.1 10*9/L (ref 0.0–0.5)
EOSINOPHILS RELATIVE PERCENT: 0.8 %
HEMATOCRIT: 34.6 % — ABNORMAL LOW (ref 39.0–48.0)
HEMOGLOBIN: 11.5 g/dL — ABNORMAL LOW (ref 12.9–16.5)
LYMPHOCYTES ABSOLUTE COUNT: 2.6 10*9/L (ref 1.1–3.6)
LYMPHOCYTES RELATIVE PERCENT: 33 %
MEAN CORPUSCULAR HEMOGLOBIN CONC: 33.1 g/dL (ref 32.0–36.0)
MEAN CORPUSCULAR HEMOGLOBIN: 27.1 pg (ref 25.9–32.4)
MEAN CORPUSCULAR VOLUME: 81.8 fL (ref 77.6–95.7)
MEAN PLATELET VOLUME: 8.4 fL (ref 6.8–10.7)
MONOCYTES ABSOLUTE COUNT: 0.6 10*9/L (ref 0.3–0.8)
MONOCYTES RELATIVE PERCENT: 7.2 %
NEUTROPHILS ABSOLUTE COUNT: 4.6 10*9/L (ref 1.8–7.8)
NEUTROPHILS RELATIVE PERCENT: 58.2 %
PLATELET COUNT: 134 10*9/L — ABNORMAL LOW (ref 150–450)
RED BLOOD CELL COUNT: 4.24 10*12/L — ABNORMAL LOW (ref 4.26–5.60)
RED CELL DISTRIBUTION WIDTH: 15 % (ref 12.2–15.2)
WBC ADJUSTED: 7.9 10*9/L (ref 3.6–11.2)

## 2023-05-19 LAB — LACTATE DEHYDROGENASE: LACTATE DEHYDROGENASE: 243 U/L — ABNORMAL HIGH (ref 81–234)

## 2023-05-19 NOTE — Unmapped (Unsigned)
[facility] Established Patient Note     Patient Name: Christian Abbott   Date of Birth: 07-Aug-1935   Date of Encounter: 05/19/2023     PCP:  Arturo Morton, MD      Assessment and Plan    # Chronic Lymphocytic Leukemia  --Rai stage IV CLL  --CLL FISH panel shows 13 q. deletion 21.5%, IGHV mutated -good prognostic features  --11/05/2020: TP53 mutation negative.  NOTCH1 negative.  SF3B1 positive.  SF3B1 positivity indicates poor prognosis.  -- no evidence of hemolysis.   --Indication for treatment:  advanced stage and history of thrombocytopenia  -- He has been treated with Rituxan many years ago by Dr Thedore Mins, with good response in the 1990s. Treatment indication at that time may have been frequent infections.  -- Started Acalabrutinib 12/19/20 - held 1 week prior to surgery on 09/10/22 and restarted on 10/09/2022.  -- Reviewed CBC, CMP. ALC 2.6.  Platelets 134.    PLAN:  -- Continue Acalabrutinib 100 mg twice daily 10/09/2022.  -- Last IVIG  03/2023.  Next dose in 07/2023.       #Stage II Colon Adenocarcinoma  - Mucinous Features [pT4N0M0]  - Colonoscopy for evaluation of iron deficiency anemia revealed a transverse colon lesion consistent with adenocarcinoma --MMR deficient, MLH1 and PMS2 absent expression.  BRAF negative  MLH1 hypermethylation + c/with sporadic MMR-deficient colon adenoCA  -- Staging CT chest without contrast and CT abdomen and pelvis with contrast scheduled for today afternoon  -- 09/10/22 Partial colectomy   -- reviewed pathology in detail and discussed [facility] colon cancer nomogram which shows a >90 % 3 year and 5 year PFS   -- I explained the inherent resistance to 63fu with MMR-deficient colon cancers and I would not recommend adjuvant FOLFOX for him given his age ,and comorbidities. His PS is recovering well after surgery however he is still quite frail. PS 2.   -- Reviewed ctDNA results 10/01/22 low positive 0.02-->10/19/22  negative (0)  -- Although initial test was positive - this test was very low positive and ~ 3w later on 2/20 repeat testing was negative. This suggests low risk of distant metastasis.   -- Given age, comorbidites would recommend against Adjuvant FOLFOX.   -- I reviewed MRI abdomen - due to secondary hemachromatosis nonspecific liver lesions remained indeterminate. Repeat MRI Abdomen 02/18/2023 shows no evidence of metastatic disease in the abdomen   -- CT Chest Wo Contrast 02/18/2023 shows no evidence for new or progressive intrathoracic malignancy. Improved bibasal aeration with decreased juxtapleural reticular densities. Clustered reticulonodular densities within the lateral right middle and lower lobes, presumably infectious/inflammatory.       PLAN:  Recent CT DNA testing 03/2023 negative. Next in 07/2023. We will continue ctDNA every 3 months.   Continue surveillance visits every 3 months for first 2 years  Repeat CT Chest and MRI abdomen and Pelvis in 6 months  ~ 07/2023. Ordered   Repeat colonoscopy due 08/2023 - messaged GI       # Patchy Lung Opacities  -- Follow up CT chest 08/13/22 Decreased mediastinal and hilar lymphadenopathy, compatible with improved CLL     # Iron Deficiency Anemia  -- He was noted to have iron deficiency in 2014 and has since received IV iron in the past and has been on oral iron 1 pill/day.   -- iron deficiency anemia/ACD in 07/2019 with hb down to 8.7 and ferritin 78, iron sat 12%  -- now s/p 1020 mg ferheme  07/2019 and early 10/2018 with hb improvement to 10.9 today.   --EGD and colonoscopy with Dr. Loreta Ave 01/2020 revealed bleeding gastric ulcer, smaller antral ulcer with old blood. .  Currently being managed with sucralfate.  -- In June 2023 hemoglobin was down to 8.3.  He was given additional IV iron with Feraheme 510 mg x 2 doses  -- Seen by Gi and planned for EGD , pending cardiology clearance  -- S/p feraheme 510 mg x 2 doses 9/28-10/9  --S/p IV iron 08/17/2022 and 08/27/2022  -- hb stable at 11.3    # Hypogammaglobulinemia   --History level continues to be low however he has not had any more infections since we started him on IVIG. Before we started him on IVIG he has had multiple hospitalizations for pneumonias.  --Therefore we will continue IVIG for now every 3 months as he is tolerated this well. Next dose due 03/2023.        # Diastolic CHF with volume overload  -- Switched to bumex as prescribed by cardiology  -- Diuresis has bee associated with some degree of renal dysfunction in the last 5-6 months  -- Continue close follow-up with Cardiology    # CKD Stage IV  -- Following closely with [facility].  -- Cr stable, 1.5 today       Disposition:  RTC in 4 weeks for labs with germline testing +natera/exam/IVIG    Interval History   Christian Abbott presents for 4-week follow-up of chronic lymphocytic leukemia . Accompanied by daughter.     C/w calquence. Received IVIG last in 03/2023. MRI Abdomen 02/18/2023 shows no evidence of metastatic disease in the abdomen . CT Chest Wo Contrast 02/18/2023 shows no evidence for new or progressive intrathoracic malignancy. Improved bibasal aeration with decreased juxtapleural reticular densities. Clustered reticulonodular densities within the lateral right middle and lower lobes, presumably infectious/inflammatory.     Accompanied by his daughter. Chronic fatigue. No increased cough, SOB, abd pain. C,d,brbpr.     ECOG PS 2.       Oncology History:  Hematology/Oncology History Overview Note   88 y/o male with long-standing history of chronic lymphocytic leukemia initially diagnosed in 1998  Status post Rituxan based therapy for 8 weeks during that time  History of anemia treated with Procrit off and on  History of hypertension diabetes asthma hypercholesterolemia.  History of hospitalization in January 2014 for bilateral pneumonia [facility] for 3 weeks.     CLL (chronic lymphocytic leukemia) (CMS-HCC)   07/18/1997 Initial Diagnosis    CLL (chronic lymphocytic leukemia)     Colon adenocarcinoma (CMS-HCC)   09/10/2022 -  Cancer Staged    Staging form: Colon and Rectum, AJCC 8th Edition  - Pathologic stage from 09/10/2022: pT4, pN0, cM0 - Signed by Elyse Hsu, MD on 11/03/2022       11/03/2022 Initial Diagnosis    Colon adenocarcinoma (CMS-HCC)         Past Medical History  Patient Active Problem List   Diagnosis    CLL (chronic lymphocytic leukemia) (CMS-HCC)    Iron deficiency anemia    Hypogammaglobulinemia (CMS-HCC)    Anemia    Need for prophylactic immunotherapy    Colon adenocarcinoma (CMS-HCC)     Past Surgical History   has a past surgical history that includes Colonoscopy (2021); Esophagogastroduodenoscopy; Inguinal hernia repair; Cervical spine surgery (2012); Coronary angioplasty with stent (2014); Cataract extraction, bilateral; pr upper gi endoscopy,w/dir submuc inj (N/A, 08/10/2022); pr upper gi endoscopy,biopsy (N/A,  08/10/2022); and pr lap,surg,colectomy,w/remvl term ileum (N/A, 09/10/2022).    Allergies:   has No Known Allergies.      Medications:   has a current medication list which includes the following prescription(s): acalabrutinib, accu-chek guide test strips, albuterol 2.5 mg /3 mL (0.083 %) Nebu 3 mL, albuterol 5 mg/mL Nebu 0.5 mL, atorvastatin, bumetanide, cholecalciferol (vitamin d3-125 mcg (5,000 unit)), clopidogrel, denta 5000 plus, doxazosin, empagliflozin, famotidine, folic acid, glipizide, glipizide, glucosamine-chondroitin, hydralazine, ipratropium-albuterol, victoza 2-pak, metoprolol succinate, dulera, potassium chloride, spironolactone, sucralfate, tresiba flextouch u-100, vit c/e/zn/coppr/lutein/zeaxan, zafirlukast, and bumetanide.    Family History/Social History:  Reviewed and updated as appropriate.    Review of Systems:   As per interval history.  All other systems reviewed and negative.    Physical Exam:   Vitals: BP 129/56  - Pulse 62  - Temp 36.8 ??C (98.2 ??F) (Temporal)  - Resp 16  - Ht 165.1 cm (5' 5)  - Wt 64 kg (141 lb)  - SpO2 97%  - BMI 23.46 kg/m??   General:  Pleasant, no acute distress.  ECOG PS 2.   Pain:  Pain Evaluation:                                   Pain Score (0 - 1):                                Pain Location:                                       Eyes:  Appears normal.   HENT:  Atraumatic.   Neck:  thyroid midline.  Lymphatics: Bilateral small cervical lymph nodes.  No supraclavicular axillary or inguinal lymphadenopathy   Cardiovascular:   RRR, trace edema b/l ankles.   Respiratory:  Unlabored.  Good air entry b/l. No wheezing.  Mild crackles at the left lower base.    Gastrointestinal: Does not appear distended.  Nontender. No palpable HSM.   Skin:  No rashes or subcutaneous nodules.    Musculoskeletal:  Gait is steady with cane.   Psychiatric:  Affect appropriate. Judgment and insight nl.  Neurologic:  Alert and oriented x3. Grossly non-focal.            Pertinent Studies/Imaging     MRI Abdomen 02/18/2023  Impression: No evidence of metastatic disease in the abdomen     CT Chest Wo Contrast 02/18/2023   Impression: No evidence for new or progressive intrathoracic malignancy. Improved bibasal aeration with decreased juxtapleural reticular densities. Clustered reticulonodular densities within the lateral right middle and lower lobes, presumably infectious/inflammatory.     Labs:   Results for orders placed or performed in visit on 05/19/23   Comprehensive Metabolic Panel   Result Value Ref Range    Sodium 135 135 - 145 mmol/L    Potassium 4.7 3.5 - 5.0 mmol/L    Chloride 102 98 - 107 mmol/L    CO2 27.3 21.0 - 32.0 mmol/L    Anion Gap 6 3 - 11 mmol/L    BUN 44 (H) 8 - 20 mg/dL    Creatinine 1.19 (H) 0.80 - 1.30 mg/dL    BUN/Creatinine Ratio 27     eGFR CKD-EPI (2021) Male 41 (L) >=60 mL/min/1.56m2  Glucose 203 (H) 70 - 179 mg/dL    Calcium 9.2 8.5 - 65.7 mg/dL    Albumin 3.0 (L) 3.5 - 5.0 g/dL    Total Protein 5.9 (L) 6.0 - 8.0 g/dL    Total Bilirubin 0.4 0.2 - 1.0 mg/dL    AST 17 15 - 40 U/L    ALT 15 12 - 78 U/L    Alkaline Phosphatase 152 (H) 46 - 116 U/L   Lactate dehydrogenase   Result Value Ref Range    LDH 243 (H) 81 - 234 U/L   CBC w/ Differential   Result Value Ref Range    Results Verified by Slide Scan Slide Reviewed     WBC 7.9 3.6 - 11.2 10*9/L    RBC 4.24 (L) 4.26 - 5.60 10*12/L    HGB 11.5 (L) 12.9 - 16.5 g/dL    HCT 84.6 (L) 96.2 - 48.0 %    MCV 81.8 77.6 - 95.7 fL    MCH 27.1 25.9 - 32.4 pg    MCHC 33.1 32.0 - 36.0 g/dL    RDW 95.2 84.1 - 32.4 %    MPV 8.4 6.8 - 10.7 fL    Platelet 134 (L) 150 - 450 10*9/L    Neutrophils % 58.2 %    Lymphocytes % 33.0 %    Monocytes % 7.2 %    Eosinophils % 0.8 %    Basophils % 0.8 %    Absolute Neutrophils 4.6 1.8 - 7.8 10*9/L    Absolute Lymphocytes 2.6 1.1 - 3.6 10*9/L    Absolute Monocytes 0.6 0.3 - 0.8 10*9/L    Absolute Eosinophils 0.1 0.0 - 0.5 10*9/L    Absolute Basophils 0.1 0.0 - 0.1 10*9/L       I personally spent 30 minutes face-to-face and non-face-to-face in the care of this patient, which includes all pre, intra, and post visit time on the date of service.    This document serves as a record of the services and decisions performed by Elyse Hsu, MD, on 05/19/2023. It was created on his behalf by Reine Just, a trained medical scribe. The creation of this document is based on the provider's statements and observations that were conveyed to the medical scribe during the patient's encounter.     The patient reports that all of his questions were answered to his satisfaction today. he was encouraged to contact us for any questions that may arise prior to his next visit.Thank you for the opportunity to participate in Christian Abbott's care.       Elyse Hsu, MD participate in Christian Abbott's care.       Elyse Hsu, MD

## 2023-05-31 DIAGNOSIS — D801 Nonfamilial hypogammaglobulinemia: Principal | ICD-10-CM

## 2023-06-07 NOTE — Unmapped (Signed)
[facility] Clinical Assessment & Refill Coordination Note    Christian Abbott, DOB: 1934/12/22  Phone: [phone] (home)     All above HIPAA information was verified with patient's family member, daughter.     Was a Nurse, learning disability used for this call? No    Specialty Medication(s):   Hematology/Oncology: Calquence     Current Outpatient Medications   Medication Sig Dispense Refill    acalabrutinib (CALQUENCE) 100 mg tablet Take 1 tablet (100 mg total) by mouth Two (2) times a day. Swallow whole with water. Do not chew, crush, dissolve, or cut tablets. 60 tablet 5    ACCU-CHEK GUIDE TEST STRIPS Strp       albuterol 2.5 mg /3 mL (0.083 %) Nebu 3 mL, albuterol 5 mg/mL Nebu 0.5 mL Inhale 2.5 mg every six (6) hours.      atorvastatin (LIPITOR) 80 MG tablet Take 1 tablet (80 mg total) by mouth at bedtime.      bumetanide (BUMEX) 0.5 MG tablet Take 1 tablet (0.5 mg total) by mouth daily.      bumetanide (BUMEX) 1 MG tablet Take 1 tablet (1 mg total) by mouth daily. 30 tablet 0    cholecalciferol, vitamin D3, 5,000 unit Tab Take 1 tablet (125 mcg total) by mouth daily.      clopidogrel (PLAVIX) 75 mg tablet Take 1 tablet (75 mg total) by mouth daily. Resume taking on 09/19/22 30 tablet 0    DENTA 5000 PLUS 1.1 % Crea Apply 1 Application to teeth daily.      doxazosin (CARDURA) 4 MG tablet Take 1 tablet (4 mg total) by mouth nightly.  1    empagliflozin (JARDIANCE) 10 mg tablet Take 1 tablet (10 mg total) by mouth daily.      famotidine (PEPCID) 40 MG tablet Take 1 tablet (40 mg total) by mouth two (2) times a day.      folic acid (FOLVITE) 800 MCG tablet Take 1 tablet (800 mcg total) by mouth daily.      glipiZIDE (GLUCOTROL XL) 2.5 MG 24 hr tablet Take 1 tablet (2.5 mg total) by mouth nightly.      glipiZIDE (GLUCOTROL XL) 5 MG 24 hr tablet Take 1 tablet (5 mg total) by mouth daily.      glucosamine-chondroitin (OSTEO BI-FLEX) 250-200 mg Tab Take 1 tablet by mouth daily.      hydrALAZINE (APRESOLINE) 50 MG tablet Take 1 tablet (50 mg total) by mouth Three (3) times a day.      ipratropium-albuterol (DUO-NEB) 0.5-2.5 mg/3 mL nebulizer 3 mL every four (4) hours as needed.  3    liraglutide (VICTOZA 2-PAK) 0.6 mg/0.1 mL (18 mg/3 mL) injection Inject 0.3 mL (1.8 mg total) under the skin nightly.      metoPROLOL succinate (TOPROL-XL) 25 MG 24 hr tablet Take 2 tablets (50 mg total) by mouth daily.      mometasone-formoterol (DULERA) 200-5 mcg/actuation HFAA Inhale 2 puffs two (2) times a day.      potassium chloride 20 MEQ ER tablet Take 1 tablet (20 mEq total) by mouth daily.      spironolactone (ALDACTONE) 25 MG tablet Take 0.5 tablets (12.5 mg total) by mouth daily. 1/2 dose per cardiologist      sucralfate (CARAFATE) 100 mg/mL suspension Take 10 mL (1 g total) by mouth nightly. 420 mL 4    TRESIBA FLEXTOUCH U-100 100 unit/mL (3 mL) InPn INJECT 5 TO 10 UNITS NIGHTLY AT BEDTIME  vit C/E/Zn/coppr/lutein/zeaxan (PRESERVISION AREDS 2 ORAL) Take 1 tablet by mouth two (2) times a day.      zafirlukast (ACCOLATE) 20 MG tablet Take 1 tablet (20 mg total) by mouth two (2) times a day.       No current facility-administered medications for this visit.        Changes to medications: Camron reports no changes at this time.    No Known Allergies    Changes to allergies: No    SPECIALTY MEDICATION ADHERENCE     Calquence 100 mg: 7 days of medicine on hand       Medication Adherence    Patient reported X missed doses in the last month: 0  Specialty Medication: calquence 100mg           Specialty medication(s) dose(s) confirmed: Regimen is correct and unchanged.     Are there any concerns with adherence? No    Adherence counseling provided? Not needed    CLINICAL MANAGEMENT AND INTERVENTION      Clinical Benefit Assessment:    Do you feel the medicine is effective or helping your condition? Yes    Clinical Benefit counseling provided? Not needed    Adverse Effects Assessment:    Are you experiencing any side effects? No    Are you experiencing difficulty administering your medicine? No    Quality of Life Assessment:    Quality of Life    Rheumatology  Oncology  1. What impact has your specialty medication had on the reduction of your daily pain or discomfort level?: None  2. On a scale of 1-10, how would you rate your ability to manage side effects associated with your specialty medication? (1=no issues, 10 = unable to take medication due to side effects): 1  Dermatology  Cystic Fibrosis               Have you discussed this with your provider? Not needed    Acute Infection Status:    Acute infections noted within Epic:  No active infections  Patient reported infection: None    Therapy Appropriateness:    Is therapy appropriate based on current medication list, adverse reactions, adherence, clinical benefit and progress toward achieving therapeutic goals? Yes, therapy is appropriate and should be continued     DISEASE/MEDICATION-SPECIFIC INFORMATION      N/A    Oncology: Is the patient receiving adequate infection prevention treatment? Not applicable  Does the patient have adequate nutritional support? Not applicable    PATIENT SPECIFIC NEEDS     Does the patient have any physical, cognitive, or cultural barriers? No    Is the patient high risk? Yes, patient is taking oral chemotherapy. Appropriateness of therapy as been assessed    Did the patient require a clinical intervention? No    Does the patient require physician intervention or other additional services (i.e., nutrition, smoking cessation, social work)? No    SOCIAL DETERMINANTS OF HEALTH     At the [facility], we have learned that life circumstances - like trouble affording food, housing, utilities, or transportation can affect the health of many of our patients.   That is why we wanted to ask: are you currently experiencing any life circumstances that are negatively impacting your health and/or quality of life? Patient declined to answer    Social Determinants of Health     Food Insecurity: No Food Insecurity (09/10/2022)    Hunger Vital Sign     Worried About Running Out of  Food in the Last Year: Never true     Ran Out of Food in the Last Year: Never true   Internet Connectivity: No Internet connectivity concern identified (09/10/2022)    Internet Connectivity     Do you have access to internet services: Yes     How do you connect to the internet: Personal Device at home     Is your internet connection strong enough for you to watch video on your device without major problems?: Yes     Do you have enough data to get through the month?: Yes     Does at least one of the devices have a camera that you can use for video chat?: Yes   Housing/Utilities: Low Risk  (09/10/2022)    Housing/Utilities     Within the past 12 months, have you ever stayed: outside, in a car, in a tent, in an overnight shelter, or temporarily in someone else's home (i.e. couch-surfing)?: No     Are you worried about losing your housing?: No     Within the past 12 months, have you been unable to get utilities (heat, electricity) when it was really needed?: No   Tobacco Use: Low Risk  (05/19/2023)    Patient History     Smoking Tobacco Use: Never     Smokeless Tobacco Use: Never     Passive Exposure: Not on file   Transportation Needs: No Transportation Needs (09/10/2022)    PRAPARE - Transportation     Lack of Transportation (Medical): No     Lack of Transportation (Non-Medical): No   Alcohol Use: Not At Risk (09/10/2022)    Alcohol Use     How often do you have a drink containing alcohol?: Never     How many drinks containing alcohol do you have on a typical day when you are drinking?: Not on file     How often do you have 5 or more drinks on one occasion?: Never   Interpersonal Safety: Not At Risk (09/10/2022)    Interpersonal Safety     Unsafe Where You Currently Live: No     Physically Hurt by Anyone: No     Abused by Anyone: No   Physical Activity: Sufficiently Active (09/10/2022)    Exercise Vital Sign     Days of Exercise per Week: 7 days     Minutes of Exercise per Session: 50 min   Intimate Partner Violence: Not At Risk (09/10/2022)    Humiliation, Afraid, Rape, and Kick questionnaire     Fear of Current or Ex-Partner: No     Emotionally Abused: No     Physically Abused: No     Sexually Abused: No   Stress: Stress Concern Present (09/10/2022)    [employer] of Occupational Health - Occupational Stress Questionnaire     Feeling of Stress : To some extent   Substance Use: Low Risk  (09/10/2022)    Substance Use     Taken prescription drugs for non-medical reasons: Never     Taken illegal drugs: Never     Patient indicated they have taken drugs in the past year for non-medical reasons: Yes, [positive answer(s)]: Not on file   Social Connections: Moderately Integrated (09/10/2022)    Social Connection and Isolation Panel [NHANES]     Frequency of Communication with Friends and Family: More than three times a week     Frequency of Social Gatherings with Friends and Family: More than three times a week  Attends Religious Services: More than 4 times per year     Active Member of Clubs or Organizations: Yes     Attends Banker Meetings: More than 4 times per year     Marital Status: Widowed   Physicist, medical Strain: Low Risk  (09/10/2022)    Overall Financial Resource Strain (CARDIA)     Difficulty of Paying Living Expenses: Not hard at all   Depression: Not at risk (09/10/2022)    PHQ-2     PHQ-2 Score: 0   Health Literacy: Low Risk  (09/10/2022)    Health Literacy     : Never       Would you be willing to receive help with any of the needs that you have identified today? Not applicable       SHIPPING     Specialty Medication(s) to be Shipped:   Hematology/Oncology: Calquence    Other medication(s) to be shipped: No additional medications requested for fill at this time     Changes to insurance: No    Delivery Scheduled: Yes, Expected medication delivery date: 10/11.     Medication will be delivered via Next Day Courier to the confirmed prescription address in [facility]    The patient will receive a drug information handout for each medication shipped and additional FDA Medication Guides as required.  Verified that patient has previously received a Conservation officer, historic buildings and a Surveyor, mining.    The patient or caregiver noted above participated in the development of this care plan and knows that they can request review of or adjustments to the care plan at any time.      All of the patient's questions and concerns have been addressed.    Clydell Hakim, PharmD   [facility] Specialty Pharmacist

## 2023-06-09 MED FILL — CALQUENCE (ACALABRUTINIB MALEATE) 100 MG TABLET: ORAL | 30 days supply | Qty: 60 | Fill #3

## 2023-06-21 DIAGNOSIS — C189 Malignant neoplasm of colon, unspecified: Principal | ICD-10-CM

## 2023-06-22 ENCOUNTER — Ambulatory Visit: Admit: 2023-06-22 | Discharge: 2023-06-23 | Payer: MEDICARE

## 2023-06-22 LAB — COMPREHENSIVE METABOLIC PANEL
ALBUMIN: 3.1 g/dL — ABNORMAL LOW (ref 3.5–5.0)
ALKALINE PHOSPHATASE: 126 U/L — ABNORMAL HIGH (ref 46–116)
ALT (SGPT): 20 U/L (ref 12–78)
ANION GAP: 13 mmol/L — ABNORMAL HIGH (ref 3–11)
AST (SGOT): 17 U/L (ref 15–40)
BILIRUBIN TOTAL: 0.5 mg/dL (ref 0.2–1.0)
BLOOD UREA NITROGEN: 47 mg/dL — ABNORMAL HIGH (ref 8–20)
BUN / CREAT RATIO: 26
CALCIUM: 8.9 mg/dL (ref 8.5–10.1)
CHLORIDE: 103 mmol/L (ref 98–107)
CO2: 22.4 mmol/L (ref 21.0–32.0)
CREATININE: 1.8 mg/dL — ABNORMAL HIGH (ref 0.80–1.30)
EGFR CKD-EPI (2021) MALE: 36 mL/min/{1.73_m2} — ABNORMAL LOW (ref >=60)
GLUCOSE RANDOM: 195 mg/dL — ABNORMAL HIGH (ref 70–179)
POTASSIUM: 4.2 mmol/L (ref 3.5–5.0)
PROTEIN TOTAL: 5.8 g/dL — ABNORMAL LOW (ref 6.0–8.0)
SODIUM: 138 mmol/L (ref 135–145)

## 2023-06-22 LAB — CBC W/ AUTO DIFF
BASOPHILS ABSOLUTE COUNT: 0.1 10*9/L (ref 0.0–0.1)
BASOPHILS RELATIVE PERCENT: 0.8 %
EOSINOPHILS ABSOLUTE COUNT: 0.1 10*9/L (ref 0.0–0.5)
EOSINOPHILS RELATIVE PERCENT: 0.6 %
HEMATOCRIT: 35.2 % — ABNORMAL LOW (ref 39.0–48.0)
HEMOGLOBIN: 11.4 g/dL — ABNORMAL LOW (ref 12.9–16.5)
LYMPHOCYTES ABSOLUTE COUNT: 3.1 10*9/L (ref 1.1–3.6)
LYMPHOCYTES RELATIVE PERCENT: 36.5 %
MEAN CORPUSCULAR HEMOGLOBIN CONC: 32.4 g/dL (ref 32.0–36.0)
MEAN CORPUSCULAR HEMOGLOBIN: 27.3 pg (ref 25.9–32.4)
MEAN CORPUSCULAR VOLUME: 84.4 fL (ref 77.6–95.7)
MEAN PLATELET VOLUME: 8.3 fL (ref 6.8–10.7)
MONOCYTES ABSOLUTE COUNT: 0.6 10*9/L (ref 0.3–0.8)
MONOCYTES RELATIVE PERCENT: 7.7 %
NEUTROPHILS ABSOLUTE COUNT: 4.6 10*9/L (ref 1.8–7.8)
NEUTROPHILS RELATIVE PERCENT: 54.4 %
PLATELET COUNT: 116 10*9/L — ABNORMAL LOW (ref 150–450)
RED BLOOD CELL COUNT: 4.17 10*12/L — ABNORMAL LOW (ref 4.26–5.60)
RED CELL DISTRIBUTION WIDTH: 15.9 % — ABNORMAL HIGH (ref 12.2–15.2)
WBC ADJUSTED: 8.4 10*9/L (ref 3.6–11.2)

## 2023-06-22 LAB — SLIDE REVIEW

## 2023-06-22 LAB — LACTATE DEHYDROGENASE: LACTATE DEHYDROGENASE: 228 U/L (ref 81–234)

## 2023-06-22 NOTE — Unmapped (Deleted)
Christian Abbott Established Patient Note     Patient Name: Christian Abbott   Date of Birth: 01/31/1935   Date of Encounter: 06/22/2023     PCP:  Christian Morton, MD      Assessment and Plan    # Chronic Lymphocytic Leukemia  --Rai stage IV CLL  --CLL FISH panel shows 13 q. deletion 21.5%, IGHV mutated -good prognostic features  --11/05/2020: TP53 mutation negative.  NOTCH1 negative.  SF3B1 positive.  SF3B1 positivity indicates poor prognosis.  -- no evidence of hemolysis.   --Indication for treatment:  advanced stage and history of thrombocytopenia  -- He has been treated with Rituxan many years ago by Dr Christian Abbott, with good response in the 1990s. Treatment indication at that time may have been frequent infections.  -- Started Acalabrutinib 12/19/20 - held 1 week prior to surgery on 09/10/22 and restarted on 10/09/2022.  -- Reviewed CBC, CMP. ALC 3.2.  Platelets 119.    PLAN:  -- Continue Acalabrutinib 100 mg twice daily 10/09/2022.  -- Last IVIG  03/2023.  Next dose in 07/2023 - scheduled       #Stage II Colon Adenocarcinoma  - Mucinous Features [pT4N0M0]  - Colonoscopy for evaluation of iron deficiency anemia revealed a transverse colon lesion consistent with adenocarcinoma --MMR deficient, MLH1 and PMS2 absent expression.  BRAF negative  MLH1 hypermethylation + c/with sporadic MMR-deficient colon adenoCA  -- Staging CT chest without contrast and CT abdomen and pelvis with contrast scheduled for today afternoon  -- 09/10/22 Partial colectomy   -- reviewed pathology in detail and discussed [facility] colon cancer nomogram which shows a >90 % 3 year and 5 year PFS   -- I explained the inherent resistance to 14fu with MMR-deficient colon cancers and I would not recommend adjuvant FOLFOX for him given his age ,and comorbidities. His PS is recovering well after surgery however he is still quite frail. PS 2.   -- Reviewed ctDNA results 10/01/22 low positive 0.02-->10/19/22  negative (0)  -- Although initial test was positive - this test was very low positive and ~ 3w later on 2/20 repeat testing was negative. This suggests low risk of distant metastasis.   -- Given age, comorbidites would recommend against Adjuvant FOLFOX.   -- I reviewed MRI abdomen - due to secondary hemachromatosis nonspecific liver lesions remained indeterminate. Repeat MRI Abdomen 02/18/2023 shows no evidence of metastatic disease in the abdomen   -- CT Chest Wo Contrast 02/18/2023 shows no evidence for new or progressive intrathoracic malignancy. Improved bibasal aeration with decreased juxtapleural reticular densities. Clustered reticulonodular densities within the lateral right middle and lower lobes, presumably infectious/inflammatory.       PLAN:  Recent CT DNA testing 03/2023 negative. Next in 07/2023. We will continue ctDNA every 3 months.   Continue surveillance visits every 3 months for first 2 years  Repeat CT Chest and MRI abdomen and Pelvis in 6 months  ~ 07/2023. Ordered   Repeat colonoscopy due 08/2023 - messaged GI       # Patchy Lung Opacities  -- Follow up CT chest 08/13/22 Decreased mediastinal and hilar lymphadenopathy, compatible with improved CLL     # Iron Deficiency Anemia  -- He was noted to have iron deficiency in 2014 and has since received IV iron in the past and has been on oral iron 1 pill/day.   -- iron deficiency anemia/ACD in 07/2019 with hb down to 8.7 and ferritin 78, iron sat 12%  -- now s/p 1020  mg ferheme 07/2019 and early 10/2018 with hb improvement to 10.9 today.   --EGD and colonoscopy with Dr. Loreta Abbott 01/2020 revealed bleeding gastric ulcer, smaller antral ulcer with old blood. .  Currently being managed with sucralfate.  -- In June 2023 hemoglobin was down to 8.3.  He was given additional IV iron with Feraheme 510 mg x 2 doses  -- Seen by Gi and planned for EGD , pending cardiology clearance  -- S/p feraheme 510 mg x 2 doses 9/28-10/9  --S/p IV iron 08/17/2022 and 08/27/2022  -- hb stable at 11.3    # Hypogammaglobulinemia --History level continues to be low however he has not had any more infections since we started him on IVIG. Before we started him on IVIG he has had multiple hospitalizations for pneumonias.  --Therefore we will continue IVIG for now every 3 months as he is tolerated this well. Next dose due 03/2023.        # Diastolic CHF with volume overload  -- Switched to bumex as prescribed by cardiology  -- Diuresis has bee associated with some degree of renal dysfunction in the last 5-6 months  -- Continue close follow-up with Cardiology    # CKD Stage IV  -- Following closely with [facility].  -- Cr stable, 1.5 today       Disposition:  RTC in 4 weeks for labs with germline testing +natera/exam/IVIG    Interval History   Christian Abbott presents for 4-week follow-up of chronic lymphocytic leukemia . Accompanied by daughter.     C/w calquence. Received IVIG last in 03/2023. MRI Abdomen 02/18/2023 shows no evidence of metastatic disease in the abdomen . CT Chest Wo Contrast 02/18/2023 shows no evidence for new or progressive intrathoracic malignancy. Improved bibasal aeration with decreased juxtapleural reticular densities. Clustered reticulonodular densities within the lateral right middle and lower lobes, presumably infectious/inflammatory.     Accompanied by his daughter. Chronic fatigue. No increased cough, SOB, abd pain. C,d,brbpr.     ECOG PS 2.       Abbott History:  Hematology/Abbott History Overview Note   88 y/o male with long-standing history of chronic lymphocytic leukemia initially diagnosed in 1998  Status post Rituxan based therapy for 8 weeks during that time  History of anemia treated with Procrit off and on  History of hypertension diabetes asthma hypercholesterolemia.  History of hospitalization in January 2014 for bilateral pneumonia [facility] for 3 weeks.     CLL (chronic lymphocytic leukemia) (CMS-HCC)   07/18/1997 Initial Diagnosis    CLL (chronic lymphocytic leukemia)     Colon adenocarcinoma (CMS-HCC)   09/10/2022 -  Cancer Staged    Staging form: Colon and Rectum, AJCC 8th Edition  - Pathologic stage from 09/10/2022: pT4, pN0, cM0 - Signed by Elyse Hsu, MD on 11/03/2022       11/03/2022 Initial Diagnosis    Colon adenocarcinoma (CMS-HCC)         Past Medical History  Patient Active Problem List   Diagnosis    CLL (chronic lymphocytic leukemia) (CMS-HCC)    Iron deficiency anemia    Hypogammaglobulinemia (CMS-HCC)    Anemia    Need for prophylactic immunotherapy    Colon adenocarcinoma (CMS-HCC)     Past Surgical History   has a past surgical history that includes Colonoscopy (2021); Esophagogastroduodenoscopy; Inguinal hernia repair; Cervical spine surgery (2012); Coronary angioplasty with stent (2014); Cataract extraction, bilateral; pr upper gi endoscopy,w/dir submuc inj (N/A, 08/10/2022); pr upper gi endoscopy,biopsy (N/A,  08/10/2022); and pr lap,surg,colectomy,w/remvl term ileum (N/A, 09/10/2022).    Allergies:   has No Known Allergies.      Medications:   has a current medication list which includes the following prescription(s): acalabrutinib, accu-chek guide test strips, albuterol 2.5 mg /3 mL (0.083 %) Nebu 3 mL, albuterol 5 mg/mL Nebu 0.5 mL, atorvastatin, bumetanide, cholecalciferol (vitamin d3-125 mcg (5,000 unit)), denta 5000 plus, doxazosin, empagliflozin, famotidine, fluticasone propionate, folic acid, glipizide, glipizide, glucosamine-chondroitin, hydralazine, ipratropium-albuterol, victoza 2-pak, losartan, metoprolol succinate, dulera, potassium chloride, spironolactone, sucralfate, vit c/e/zn/coppr/lutein/zeaxan, zafirlukast, bumetanide, clopidogrel, and tresiba flextouch u-100.    Family History/Social History:  Reviewed and updated as appropriate.    Review of Systems:   As per interval history.  All other systems reviewed and negative.    Physical Exam:   Vitals: BP 125/52  - Pulse 65  - Temp 36.2 ??C (97.2 ??F) (Temporal)  - Resp 16  - Ht 165.1 cm (5' 5)  - Wt 64.9 kg (143 lb 1.6 oz)  - SpO2 100%  - BMI 23.81 kg/m??   General:  Pleasant, no acute distress.  ECOG PS 2.   Pain:  Pain Evaluation:                                   Pain Score (0 - 1):                                Pain Location:                                       Eyes:  Appears normal.   HENT:  Atraumatic.   Neck:  thyroid midline.  Lymphatics: Bilateral small cervical lymph nodes.  No supraclavicular axillary or inguinal lymphadenopathy   Cardiovascular:   RRR, trace edema b/l ankles.   Respiratory:  Unlabored.  Good air entry b/l. No wheezing.  Mild crackles at the left lower base.    Gastrointestinal: Does not appear distended.  Nontender. No palpable HSM.   Skin:  No rashes or subcutaneous nodules.    Musculoskeletal:  Gait is steady with cane.   Psychiatric:  Affect appropriate. Judgment and insight nl.  Neurologic:  Alert and oriented x3. Grossly non-focal.            Pertinent Studies/Imaging     MRI Abdomen 02/18/2023  Impression: No evidence of metastatic disease in the abdomen     CT Chest Wo Contrast 02/18/2023   Impression: No evidence for new or progressive intrathoracic malignancy. Improved bibasal aeration with decreased juxtapleural reticular densities. Clustered reticulonodular densities within the lateral right middle and lower lobes, presumably infectious/inflammatory.     Labs:   Results for orders placed or performed in visit on 06/22/23   Comprehensive Metabolic Panel   Result Value Ref Range    Sodium 138 135 - 145 mmol/L    Potassium 4.2 3.5 - 5.0 mmol/L    Chloride 103 98 - 107 mmol/L    CO2 22.4 21.0 - 32.0 mmol/L    Anion Gap 13 (H) 3 - 11 mmol/L    BUN 47 (H) 8 - 20 mg/dL    Creatinine 0.27 (H) 0.80 - 1.30 mg/dL    BUN/Creatinine Ratio 26     eGFR CKD-EPI (2021) Male 54 (  L) >=60 mL/min/1.60m2    Glucose 195 (H) 70 - 179 mg/dL    Calcium 8.9 8.5 - 42.5 mg/dL    Albumin 3.1 (L) 3.5 - 5.0 g/dL    Total Protein 5.8 (L) 6.0 - 8.0 g/dL    Total Bilirubin 0.5 0.2 - 1.0 mg/dL    AST 17 15 - 40 U/L ALT 20 12 - 78 U/L    Alkaline Phosphatase 126 (H) 46 - 116 U/L   Lactate dehydrogenase   Result Value Ref Range    LDH 228 81 - 234 U/L   CBC w/ Differential   Result Value Ref Range    Results Verified by Slide Scan Slide Reviewed     WBC 8.4 3.6 - 11.2 10*9/L    RBC 4.17 (L) 4.26 - 5.60 10*12/L    HGB 11.4 (L) 12.9 - 16.5 g/dL    HCT 95.6 (L) 38.7 - 48.0 %    MCV 84.4 77.6 - 95.7 fL    MCH 27.3 25.9 - 32.4 pg    MCHC 32.4 32.0 - 36.0 g/dL    RDW 56.4 (H) 33.2 - 15.2 %    MPV 8.3 6.8 - 10.7 fL    Platelet 116 (L) 150 - 450 10*9/L    Neutrophils % 54.4 %    Lymphocytes % 36.5 %    Monocytes % 7.7 %    Eosinophils % 0.6 %    Basophils % 0.8 %    Absolute Neutrophils 4.6 1.8 - 7.8 10*9/L    Absolute Lymphocytes 3.1 1.1 - 3.6 10*9/L    Absolute Monocytes 0.6 0.3 - 0.8 10*9/L    Absolute Eosinophils 0.1 0.0 - 0.5 10*9/L    Absolute Basophils 0.1 0.0 - 0.1 10*9/L   Morphology Review   Result Value Ref Range    Ovalocytes      Hypersegmented Neutrophils      Neutrophil Left Shift Present (A) Not Present       I personally spent 30 minutes face-to-face and non-face-to-face in the care of this patient, which includes all pre, intra, and post visit time on the date of service.    This document serves as a record of the services and decisions performed by Elyse Hsu, MD, on 06/22/2023. It was created on his behalf by Reine Just, a trained medical scribe. The creation of this document is based on the provider's statements and observations that were conveyed to the medical scribe during the patient's encounter.     The patient reports that all of his questions were answered to his satisfaction today. he was encouraged to contact us for any questions that may arise prior to his next visit.Thank you for the opportunity to participate in [place]'s care.       Elyse Hsu, MD

## 2023-06-22 NOTE — Unmapped (Signed)
Ordered Natera testing for 2025; q3 months per provider's request.

## 2023-06-22 NOTE — Unmapped (Signed)
[facility] Established Patient Note     Patient Name: Christian Abbott   Date of Birth: 1935/08/20   Date of Encounter: 06/22/2023     PCP:  Arturo Morton, MD      Assessment and Plan    # Chronic Lymphocytic Leukemia  --Rai stage IV CLL  --CLL FISH panel shows 13 q. deletion 21.5%, IGHV mutated -good prognostic features  --11/05/2020: TP53 mutation negative.  NOTCH1 negative.  SF3B1 positive.  SF3B1 positivity indicates poor prognosis.  -- no evidence of hemolysis.   --Indication for treatment:  advanced stage and history of thrombocytopenia  -- He has been treated with Rituxan many years ago by Dr Thedore Mins, with good response in the 1990s. Treatment indication at that time may have been frequent infections.  -- Started Acalabrutinib 12/19/20 - held 1 week prior to surgery on 09/10/22 and restarted on 10/09/2022.  -- Reviewed CBC, CMP. ALC 2.6.  Platelets 134.    PLAN:  -- Continue Acalabrutinib 100 mg twice daily 10/09/2022.  -- Last IVIG  03/2023.  Next dose in 07/2023.       #Stage II Colon Adenocarcinoma  - Mucinous Features [pT4N0M0]  - Colonoscopy for evaluation of iron deficiency anemia revealed a transverse colon lesion consistent with adenocarcinoma --MMR deficient, MLH1 and PMS2 absent expression.  BRAF negative  MLH1 hypermethylation + c/with sporadic MMR-deficient colon adenoCA  -- Staging CT chest without contrast and CT abdomen and pelvis with contrast scheduled for today afternoon  -- 09/10/22 Partial colectomy   -- reviewed pathology in detail and discussed [facility] colon cancer nomogram which shows a >90 % 3 year and 5 year PFS   -- I explained the inherent resistance to 77fu with MMR-deficient colon cancers and I would not recommend adjuvant FOLFOX for him given his age ,and comorbidities. His PS is recovering well after surgery however he is still quite frail. PS 2.   -- Reviewed ctDNA results 10/01/22 low positive 0.02-->10/19/22  negative (0)  -- Although initial test was positive - this test was very low positive and ~ 3w later on 2/20 repeat testing was negative. This suggests low risk of distant metastasis.   -- Given age, comorbidites would recommend against Adjuvant FOLFOX.   --MRI Abdomen 02/18/2023 shows no evidence of metastatic disease in the abdomen   -- CT Chest Wo Contrast 02/18/2023 shows no evidence for new or progressive intrathoracic malignancy. Improved bibasal aeration with decreased juxtapleural reticular densities. Clustered reticulonodular densities within the lateral right middle and lower lobes, presumably infectious/inflammatory.       PLAN:  Recent CT DNA testing 03/2023 negative. Next in 07/2023. We will continue ctDNA every 3 months until year 2 then transition to every 6 months for  year 3-5.    Continue surveillance visits at least every 3 months for first 2 years - however patient is being seen monthly for CLL.   Repeat CT Chest and MRI abdomen and Pelvis in 6 months  ~ 07/2023. Ordered   Repeat colonoscopy due 08/2023 - messaged GI team.       # Iron Deficiency Anemia  -- He was noted to have iron deficiency in 2014 and has since received IV iron in the past and has been on oral iron 1 pill/day.   -- iron deficiency anemia/ACD in 07/2019 with hb down to 8.7 and ferritin 78, iron sat 12%  -- now s/p 1020 mg ferheme 07/2019 and early 10/2018 with hb improvement to 10.9 today.   --EGD  and colonoscopy with Dr. Loreta Ave 01/2020 revealed bleeding gastric ulcer, smaller antral ulcer with old blood. .  Currently being managed with sucralfate.  -- In June 2023 hemoglobin was down to 8.3.  He was given additional IV iron with Feraheme 510 mg x 2 doses  -- Seen by Gi and planned for EGD , pending cardiology clearance  -- S/p feraheme 510 mg x 2 doses 9/28-10/9  --S/p IV iron 08/17/2022 and 08/27/2022  -- hb stable at 11.4    # Hypogammaglobulinemia   --History level continues to be low however he has not had any more infections since we started him on IVIG. Before we started him on IVIG he has had multiple hospitalizations for pneumonias.  --Therefore we will continue IVIG for now every 3 months as he is tolerated this well.   -- next IVIG dose scheduled for 07/2023       # Diastolic CHF with volume overload  -- Switched to bumex as prescribed by cardiology  -- Diuresis has bee associated with some degree of renal dysfunction in the last 5-6 months  -- Continue close follow-up with Cardiology    # CKD Stage IV  -- Following closely with [facility].  -- Cr stable, 1.8 today       Disposition:  RTC in 4 weeks for labs/exam    Interval History   Christian Abbott presents for 4-week follow-up of chronic lymphocytic leukemia . Accompanied by daughter.     C/w calquence. Received IVIG last in 03/2023. MRI Abdomen 02/18/2023 shows no evidence of metastatic disease in the abdomen . CT Chest Wo Contrast 02/18/2023 shows no evidence for new or progressive intrathoracic malignancy. Improved bibasal aeration with decreased juxtapleural reticular densities. Clustered reticulonodular densities within the lateral right middle and lower lobes, presumably infectious/inflammatory.     Accompanied by his daughter. Chronic fatigue. No increased cough, SOB, abd pain. C, d, brbpr or melena.     ECOG PS 2.         Oncology History:  Hematology/Oncology History Overview Note   88 y/o male with long-standing history of chronic lymphocytic leukemia initially diagnosed in 1998  Status post Rituxan based therapy for 8 weeks during that time  History of anemia treated with Procrit off and on  History of hypertension diabetes asthma hypercholesterolemia.  History of hospitalization in January 2014 for bilateral pneumonia [facility] for 3 weeks.     CLL (chronic lymphocytic leukemia) (CMS-HCC)   07/18/1997 Initial Diagnosis    CLL (chronic lymphocytic leukemia)     Colon adenocarcinoma (CMS-HCC)   09/10/2022 -  Cancer Staged    Staging form: Colon and Rectum, AJCC 8th Edition  - Pathologic stage from 09/10/2022: pT4, pN0, cM0 - Signed by Elyse Hsu, MD on 11/03/2022       11/03/2022 Initial Diagnosis    Colon adenocarcinoma (CMS-HCC)         Past Medical History  Patient Active Problem List   Diagnosis    CLL (chronic lymphocytic leukemia) (CMS-HCC)    Iron deficiency anemia    Hypogammaglobulinemia (CMS-HCC)    Anemia    Need for prophylactic immunotherapy    Colon adenocarcinoma (CMS-HCC)     Past Surgical History   has a past surgical history that includes Colonoscopy (2021); Esophagogastroduodenoscopy; Inguinal hernia repair; Cervical spine surgery (2012); Coronary angioplasty with stent (2014); Cataract extraction, bilateral; pr upper gi endoscopy,w/dir submuc inj (N/A, 08/10/2022); pr upper gi endoscopy,biopsy (N/A, 08/10/2022); and pr lap,surg,colectomy,w/remvl term ileum (N/A,  09/10/2022).    Allergies:   has No Known Allergies.      Medications:   has a current medication list which includes the following prescription(s): acalabrutinib, accu-chek guide test strips, albuterol 2.5 mg /3 mL (0.083 %) Nebu 3 mL, albuterol 5 mg/mL Nebu 0.5 mL, atorvastatin, bumetanide, cholecalciferol (vitamin d3-125 mcg (5,000 unit)), denta 5000 plus, doxazosin, empagliflozin, famotidine, fluticasone propionate, folic acid, glipizide, glipizide, glucosamine-chondroitin, hydralazine, ipratropium-albuterol, victoza 2-pak, losartan, metoprolol succinate, dulera, potassium chloride, spironolactone, sucralfate, vit c/e/zn/coppr/lutein/zeaxan, zafirlukast, bumetanide, clopidogrel, and tresiba flextouch u-100.    Family History/Social History:  Reviewed and updated as appropriate.    Review of Systems:   As per interval history.  All other systems reviewed and negative.    Physical Exam:   Vitals: BP 125/52  - Pulse 65  - Temp 36.2 ??C (97.2 ??F) (Temporal)  - Resp 16  - Ht 165.1 cm (5' 5)  - Wt 64.9 kg (143 lb 1.6 oz)  - SpO2 100%  - BMI 23.81 kg/m??   General:  Pleasant, no acute distress.  ECOG PS 2.   Pain:  Pain Evaluation: Pain Score (0 - 1):                                Pain Location:                                       Eyes:  Appears normal.   HENT:  Atraumatic.   Neck:  thyroid midline.  Lymphatics: Bilateral small cervical lymph nodes.  No supraclavicular axillary or inguinal lymphadenopathy   Cardiovascular:   RRR, trace edema b/l ankles.   Respiratory:  Unlabored.  Good air entry b/l. No wheezing.  Mild crackles at the left lower base.    Gastrointestinal: Does not appear distended.  Nontender. No palpable HSM.   Skin:  No rashes or subcutaneous nodules.    Musculoskeletal:  Gait is steady with cane.   Psychiatric:  Affect appropriate. Judgment and insight nl.  Neurologic:  Alert and oriented x3. Grossly non-focal.            Pertinent Studies/Imaging     MRI Abdomen 02/18/2023  Impression: No evidence of metastatic disease in the abdomen     CT Chest Wo Contrast 02/18/2023   Impression: No evidence for new or progressive intrathoracic malignancy. Improved bibasal aeration with decreased juxtapleural reticular densities. Clustered reticulonodular densities within the lateral right middle and lower lobes, presumably infectious/inflammatory.     Labs:   Results for orders placed or performed in visit on 06/22/23   Comprehensive Metabolic Panel   Result Value Ref Range    Sodium 138 135 - 145 mmol/L    Potassium 4.2 3.5 - 5.0 mmol/L    Chloride 103 98 - 107 mmol/L    CO2 22.4 21.0 - 32.0 mmol/L    Anion Gap 13 (H) 3 - 11 mmol/L    BUN 47 (H) 8 - 20 mg/dL    Creatinine 1.61 (H) 0.80 - 1.30 mg/dL    BUN/Creatinine Ratio 26     eGFR CKD-EPI (2021) Male 36 (L) >=60 mL/min/1.79m2    Glucose 195 (H) 70 - 179 mg/dL    Calcium 8.9 8.5 - 09.6 mg/dL    Albumin 3.1 (L) 3.5 - 5.0 g/dL    Total Protein 5.8 (L) 6.0 -  8.0 g/dL    Total Bilirubin 0.5 0.2 - 1.0 mg/dL    AST 17 15 - 40 U/L    ALT 20 12 - 78 U/L    Alkaline Phosphatase 126 (H) 46 - 116 U/L   Lactate dehydrogenase   Result Value Ref Range    LDH 228 81 - 234 U/L   CBC w/ Differential   Result Value Ref Range    Results Verified by Slide Scan Slide Reviewed     WBC 8.4 3.6 - 11.2 10*9/L    RBC 4.17 (L) 4.26 - 5.60 10*12/L    HGB 11.4 (L) 12.9 - 16.5 g/dL    HCT 16.1 (L) 09.6 - 48.0 %    MCV 84.4 77.6 - 95.7 fL    MCH 27.3 25.9 - 32.4 pg    MCHC 32.4 32.0 - 36.0 g/dL    RDW 04.5 (H) 40.9 - 15.2 %    MPV 8.3 6.8 - 10.7 fL    Platelet 116 (L) 150 - 450 10*9/L    Neutrophils % 54.4 %    Lymphocytes % 36.5 %    Monocytes % 7.7 %    Eosinophils % 0.6 %    Basophils % 0.8 %    Absolute Neutrophils 4.6 1.8 - 7.8 10*9/L    Absolute Lymphocytes 3.1 1.1 - 3.6 10*9/L    Absolute Monocytes 0.6 0.3 - 0.8 10*9/L    Absolute Eosinophils 0.1 0.0 - 0.5 10*9/L    Absolute Basophils 0.1 0.0 - 0.1 10*9/L   Morphology Review   Result Value Ref Range    Ovalocytes      Hypersegmented Neutrophils      Neutrophil Left Shift Present (A) Not Present       I personally spent 30 minutes face-to-face and non-face-to-face in the care of this patient, which includes all pre, intra, and post visit time on the date of service.    The patient reports that all of his questions were answered to his satisfaction today. he was encouraged to contact us for any questions that may arise prior to his next visit.Thank you for the opportunity to participate in [facility]'s care.       Elyse Hsu, MD

## 2023-06-22 NOTE — Unmapped (Signed)
Christian Pizza, MD  Loa Socks, CMA  Can we schedule for surveillance colonoscopy with me or swamy at [facility] for history of colon cancer?

## 2023-06-22 NOTE — Unmapped (Signed)
Latest Reference Range & Units 06/22/23 08:32   WBC 3.6 - 11.2 10*9/L 8.4   RBC 4.26 - 5.60 10*12/L 4.17 (L)   HGB 12.9 - 16.5 g/dL 62.9 (L)   HCT 52.8 - 41.3 % 35.2 (L)   MCV 77.6 - 95.7 fL 84.4   MCH 25.9 - 32.4 pg 27.3   MCHC 32.0 - 36.0 g/dL 24.4   RDW 01.0 - 27.2 % 15.9 (H)   MPV 6.8 - 10.7 fL 8.3   Platelet 150 - 450 10*9/L 116 (L)   Neutrophils % % 54.4   Lymphocytes % % 36.5   Monocytes % % 7.7   Eosinophils % % 0.6   Basophils % % 0.8   Absolute Neutrophils 1.8 - 7.8 10*9/L 4.6   Absolute Lymphocytes 1.1 - 3.6 10*9/L 3.1   Absolute Monocytes  0.3 - 0.8 10*9/L 0.6   Absolute Eosinophils 0.0 - 0.5 10*9/L 0.1   Absolute Basophils  0.0 - 0.1 10*9/L 0.1   Ovalocytes  See Comment   Hypersegmented Neutrophils  See Comment   Neutrophil Left Shift Not Present  Present !   Sodium 135 - 145 mmol/L 138   Potassium 3.5 - 5.0 mmol/L 4.2   Chloride 98 - 107 mmol/L 103   CO2 21.0 - 32.0 mmol/L 22.4   Bun 8 - 20 mg/dL 47 (H)   Creatinine 5.36 - 1.30 mg/dL 6.44 (H)   BUN/Creatinine Ratio  26   eGFR CKD-EPI (2021) Male >=60 mL/min/1.77m2 36 (L)   Anion Gap 3 - 11 mmol/L 13 (H)   Glucose 70 - 179 mg/dL 034 (H)   Calcium 8.5 - 10.1 mg/dL 8.9   Albumin 3.5 - 5.0 g/dL 3.1 (L)   Total Protein 6.0 - 8.0 g/dL 5.8 (L)   Total Bilirubin 0.2 - 1.0 mg/dL 0.5   SGOT (AST) 15 - 40 U/L 17   ALT 12 - 78 U/L 20   Alkaline Phosphatase 46 - 116 U/L 126 (H)   (L): Data is abnormally low  (H): Data is abnormally high  !: Data is abnormal

## 2023-06-22 NOTE — Unmapped (Signed)
To Shawn, please schedule at [place], thanks!    Procedure: Colonoscopy  MD: [place] or Dr. Margaretann Loveless  Dx: hx of colon cancer  Latex allergy: No  PAT needed: Yes- taking GLP-1  CC faxed: Yes- on plavix       Prep instructions and clearance to be sent once scheduled.  Will also need diabetic medication instructions.

## 2023-06-23 MED ORDER — PEG-ELECTROLYTE SOLUTION 420 GRAM ORAL SOLUTION
Freq: Once | ORAL | 0 refills | 1 days | Status: CP
Start: 2023-06-23 — End: 2023-06-23

## 2023-06-23 NOTE — Unmapped (Signed)
Procedure information, GLP-1 Nulytely prep instructions and diabetic medication instructions sent via MyChart. Nulytely escribed to patient's pharmacy. Cardiac clearance faxed to Dr. Everardo All to hold Plavix- copy to Kia for tracking.

## 2023-06-23 NOTE — Unmapped (Signed)
Pt agrees to the following appointments. Please send packet via [facility], Send in Rx if needed, and obtain any clearances if required.    CASE: [id number]   PROCEDURE: COLON  PT SCHEDULED:  1/21 @ 0800 at [place] / [place]  PRETESTING: yes 1/14 @ 1430

## 2023-06-27 NOTE — Unmapped (Signed)
Lvm informing pt that he has been cleared to have his procedure and that he is to hold plavix for 5 days prior to procedure and to continue to his asa.

## 2023-07-01 DIAGNOSIS — D801 Nonfamilial hypogammaglobulinemia: Principal | ICD-10-CM

## 2023-07-06 NOTE — Unmapped (Signed)
[facility] Specialty and Home Delivery Pharmacy Refill Coordination Note    Specialty Medication(s) to be Shipped:   Hematology/Oncology: Calquence 100mg     Other medication(s) to be shipped: No additional medications requested for fill at this time     Christian Abbott, DOB: Nov 03, 1934  Phone: [phone] (home)       All above HIPAA information was verified with  patient's daughter      Was a Nurse, learning disability used for this call? No    Completed refill call assessment today to schedule patient's medication shipment from the [facility] and Home Delivery Pharmacy  [phone]).  All relevant notes have been reviewed.     Specialty medication(s) and dose(s) confirmed: Regimen is correct and unchanged.   Changes to medications: Amore reports no changes at this time.  Changes to insurance: No  New side effects reported not previously addressed with a pharmacist or physician: None reported  Questions for the pharmacist: No    Confirmed patient received a Conservation officer, historic buildings and a Surveyor, mining with first shipment. The patient will receive a drug information handout for each medication shipped and additional FDA Medication Guides as required.       DISEASE/MEDICATION-SPECIFIC INFORMATION        N/A    SPECIALTY MEDICATION ADHERENCE     Medication Adherence    Patient reported X missed doses in the last month: 0  Specialty Medication: CALQUENCE 100 mg tablet (acalabrutinib)  Patient is on additional specialty medications: No  Informant: patient              Were doses missed due to medication being on hold? No    Calquence 100 mg: 10 days of medicine on hand       REFERRAL TO PHARMACIST     Referral to the pharmacist: Not needed      [facility]     Shipping address confirmed in Epic.       Delivery Scheduled: Yes, Expected medication delivery date: 07/13/23.     Medication will be delivered via Next Day Courier to the prescription address in Epic WAM.    Christian Abbott   [facility] Specialty and Home Delivery Pharmacy Specialty Technician

## 2023-07-12 MED FILL — CALQUENCE (ACALABRUTINIB MALEATE) 100 MG TABLET: ORAL | 30 days supply | Qty: 60 | Fill #4

## 2023-07-22 DIAGNOSIS — C9110 Chronic lymphocytic leukemia of B-cell type not having achieved remission: Principal | ICD-10-CM

## 2023-07-25 ENCOUNTER — Ambulatory Visit: Admit: 2023-07-25 | Discharge: 2023-07-25 | Payer: MEDICARE

## 2023-07-25 DIAGNOSIS — C9110 Chronic lymphocytic leukemia of B-cell type not having achieved remission: Principal | ICD-10-CM

## 2023-07-25 DIAGNOSIS — D801 Nonfamilial hypogammaglobulinemia: Principal | ICD-10-CM

## 2023-07-25 DIAGNOSIS — D509 Iron deficiency anemia, unspecified: Principal | ICD-10-CM

## 2023-07-25 LAB — COMPREHENSIVE METABOLIC PANEL
ALBUMIN: 3.4 g/dL — ABNORMAL LOW (ref 3.5–5.0)
ALKALINE PHOSPHATASE: 136 U/L — ABNORMAL HIGH (ref 46–116)
ALT (SGPT): 21 U/L (ref 12–78)
ANION GAP: 9 mmol/L (ref 3–11)
AST (SGOT): 18 U/L (ref 15–40)
BILIRUBIN TOTAL: 0.4 mg/dL (ref 0.2–1.0)
BLOOD UREA NITROGEN: 52 mg/dL — ABNORMAL HIGH (ref 8–20)
BUN / CREAT RATIO: 31
CALCIUM: 9.2 mg/dL (ref 8.5–10.1)
CHLORIDE: 104 mmol/L (ref 98–107)
CO2: 25.9 mmol/L (ref 21.0–32.0)
CREATININE: 1.66 mg/dL — ABNORMAL HIGH (ref 0.80–1.30)
EGFR CKD-EPI (2021) MALE: 40 mL/min/{1.73_m2} — ABNORMAL LOW (ref >=60)
GLUCOSE RANDOM: 170 mg/dL (ref 70–179)
POTASSIUM: 4.8 mmol/L (ref 3.5–5.0)
PROTEIN TOTAL: 6.1 g/dL (ref 6.0–8.0)
SODIUM: 139 mmol/L (ref 135–145)

## 2023-07-25 LAB — CBC W/ AUTO DIFF
BASOPHILS ABSOLUTE COUNT: 0 10*9/L (ref 0.0–0.1)
BASOPHILS RELATIVE PERCENT: 0.7 %
EOSINOPHILS ABSOLUTE COUNT: 0.1 10*9/L (ref 0.0–0.5)
EOSINOPHILS RELATIVE PERCENT: 1 %
HEMATOCRIT: 36.3 % — ABNORMAL LOW (ref 39.0–48.0)
HEMOGLOBIN: 11.9 g/dL — ABNORMAL LOW (ref 12.9–16.5)
LYMPHOCYTES ABSOLUTE COUNT: 2 10*9/L (ref 1.1–3.6)
LYMPHOCYTES RELATIVE PERCENT: 33 %
MEAN CORPUSCULAR HEMOGLOBIN CONC: 32.9 g/dL (ref 32.0–36.0)
MEAN CORPUSCULAR HEMOGLOBIN: 27.6 pg (ref 25.9–32.4)
MEAN CORPUSCULAR VOLUME: 84.1 fL (ref 77.6–95.7)
MEAN PLATELET VOLUME: 8.3 fL (ref 6.8–10.7)
MONOCYTES ABSOLUTE COUNT: 0.5 10*9/L (ref 0.3–0.8)
MONOCYTES RELATIVE PERCENT: 8.8 %
NEUTROPHILS ABSOLUTE COUNT: 3.4 10*9/L (ref 1.8–7.8)
NEUTROPHILS RELATIVE PERCENT: 56.5 %
PLATELET COUNT: 96 10*9/L — ABNORMAL LOW (ref 150–450)
RED BLOOD CELL COUNT: 4.32 10*12/L (ref 4.26–5.60)
RED CELL DISTRIBUTION WIDTH: 15.7 % — ABNORMAL HIGH (ref 12.2–15.2)
WBC ADJUSTED: 6 10*9/L (ref 3.6–11.2)

## 2023-07-25 LAB — LACTATE DEHYDROGENASE: LACTATE DEHYDROGENASE: 233 U/L (ref 81–234)

## 2023-07-25 LAB — IGG: GAMMAGLOBULIN; IGG: 223 mg/dL — ABNORMAL LOW (ref 646–2013)

## 2023-07-25 MED ADMIN — acetaminophen (TYLENOL) tablet 650 mg: 650 mg | ORAL | @ 14:00:00 | Stop: 2023-07-25

## 2023-07-25 MED ADMIN — immun glob G(IgG)-pro-IgA 0-50 (PRIVIGEN) 10 % intravenous solution 20 g: 20 g | INTRAVENOUS | @ 15:00:00 | Stop: 2023-07-25

## 2023-07-25 MED ADMIN — diphenhydrAMINE (BENADRYL) capsule/tablet 25 mg: 25 mg | ORAL | @ 14:00:00 | Stop: 2023-07-25

## 2023-07-25 MED ADMIN — sodium chloride (NS) 0.9 % infusion: 20 mL/h | INTRAVENOUS | @ 14:00:00 | Stop: 2023-07-25

## 2023-07-25 MED ADMIN — immun glob G(IgG)-pro-IgA 0-50 (PRIVIGEN) 10 % intravenous solution 5 g: 5 g | INTRAVENOUS | @ 15:00:00 | Stop: 2023-07-25

## 2023-07-25 NOTE — Unmapped (Signed)
[facility] Established Patient Note     Patient Name: Christian Abbott   Date of Birth: 1934/11/27   Date of Encounter: 07/25/2023     PCP:  Arturo Morton, MD      Assessment and Plan    # Chronic Lymphocytic Leukemia  --Rai stage IV CLL  --CLL FISH panel shows 13 q. deletion 21.5%, IGHV mutated -good prognostic features  --11/05/2020: TP53 mutation negative.  NOTCH1 negative.  SF3B1 positive.  SF3B1 positivity indicates poor prognosis.  -- no evidence of hemolysis.   --Indication for treatment:  advanced stage and history of thrombocytopenia  -- He has been treated with Rituxan many years ago by Dr Thedore Mins, with good response in the 1990s. Treatment indication at that time may have been frequent infections.  -- Started Acalabrutinib 12/19/20 - held 1 week prior to surgery on 09/10/22 and restarted on 10/09/2022.  -- Reviewed CBC, CMP. ALC 2.0.  Platelets 96.    PLAN:  -- Continue Acalabrutinib 100 mg twice daily   -- Last IVIG  03/2023.  Next dose today  in 07/2023.   -- sent message to schedule to schedule next IVIG infusion in late 10/2023.         #Stage II Colon Adenocarcinoma  - Mucinous Features [pT4N0M0]  - Colonoscopy for evaluation of iron deficiency anemia revealed a transverse colon lesion consistent with adenocarcinoma --MMR deficient, MLH1 and PMS2 absent expression.  BRAF negative  MLH1 hypermethylation + c/with sporadic MMR-deficient colon adenoCA  -- Staging CT chest without contrast and CT abdomen and pelvis with contrast scheduled for today afternoon  -- 09/10/22 Partial colectomy   -- reviewed pathology in detail and discussed [facility]KCC colon cancer nomogram which shows a >90 % 3 year and 5 year PFS   -- I explained the inherent resistance to 78fu with MMR-deficient colon cancers and I would not recommend adjuvant FOLFOX for him given his age ,and comorbidities. His PS is recovering well after surgery however he is still quite frail. PS 2.   -- Reviewed ctDNA results 10/01/22 low positive 0.02-->10/19/22  negative (0)  -- Although initial test was positive - this test was very low positive and ~ 3w later on 2/20 repeat testing was negative. This suggests low risk of distant metastasis.   -- Given age, comorbidites would recommend against Adjuvant FOLFOX.   --MRI Abdomen 02/18/2023 shows no evidence of metastatic disease in the abdomen   -- CT Chest Wo Contrast 02/18/2023 shows no evidence for new or progressive intrathoracic malignancy. Improved bibasal aeration with decreased juxtapleural reticular densities. Clustered reticulonodular densities within the lateral right middle and lower lobes, presumably infectious/inflammatory.       PLAN:  Recent CT DNA testing 03/2023 negative. ctDNA drawn  07/23/2023 - results pending.    We will continue ctDNA every 3 months until year 2  then transition to every 6 months for  year 3-5.    Continue surveillance visits at least every 3 months for first 2 years - however patient is being seen every 4-6 weeks for CLL.   Repeat  6 month interval CT Chest won contrast and MRI abdomen and Pelvis  with/without  ~ 07/2023. scheduled   Repeat colonoscopy due 08/2023 - scheduled       # Iron Deficiency Anemia  -- He was noted to have iron deficiency in 2014 and has since received IV iron in the past and has been on oral iron 1 pill/day.   -- iron deficiency anemia/ACD in  07/2019 with hb down to 8.7 and ferritin 78, iron sat 12%  -- now s/p 1020 mg ferheme 07/2019 and early 10/2018 with hb improvement to 10.9 today.   --EGD and colonoscopy with Dr. Loreta Ave 01/2020 revealed bleeding gastric ulcer, smaller antral ulcer with old blood. .  Currently being managed with sucralfate.  -- In June 2023 hemoglobin was down to 8.3.  He was given additional IV iron with Feraheme 510 mg x 2 doses  -- Seen by Gi and planned for EGD , pending cardiology clearance  -- S/p feraheme 510 mg x 2 doses 9/28-10/9  --S/p IV iron 08/17/2022 and 08/27/2022  -- hb stable at 11.4    # Hypogammaglobulinemia   --History level continues to be low however he has not had any more infections since we started him on IVIG. Before we started him on IVIG he has had multiple hospitalizations for pneumonias.  --Therefore we will continue IVIG for now every 3 months as he is tolerated this well.   -- next IVIG dose scheduled for today  -- next infusion requested for 10/2023      # Diastolic CHF with volume overload  -- Switched to bumex as prescribed by cardiology  -- Diuresis has bee associated with some degree of renal dysfunction in the last 5-6 months  -- Continue close follow-up with Cardiology    # CKD Stage IV  -- Following closely with [facility].  -- Cr stable, 1.66 today       Disposition:  RTC in 6 weeks for labs/exam    Interval History   Mr. Picardo presents for 4-week follow-up of chronic lymphocytic leukemia . Accompanied by daughter.     C/w calquence. Received IVIG last in 03/2023. MRI Abdomen 02/18/2023 shows no evidence of metastatic disease in the abdomen . CT Chest Wo Contrast 02/18/2023 shows no evidence for new or progressive intrathoracic malignancy. Improved bibasal aeration with decreased juxtapleural reticular densities. Clustered reticulonodular densities within the lateral right middle and lower lobes, presumably infectious/inflammatory.     Accompanied by his daughter. Chronic fatigue. No increased cough, SOB, abd pain. C, d, brbpr or melena.     ECOG PS 2.         Oncology History:  Hematology/Oncology History Overview Note   88 y/o male with long-standing history of chronic lymphocytic leukemia initially diagnosed in 1998  Status post Rituxan based therapy for 8 weeks during that time  History of anemia treated with Procrit off and on  History of hypertension diabetes asthma hypercholesterolemia.  History of hospitalization in January 2014 for bilateral pneumonia [facility] for 3 weeks.     CLL (chronic lymphocytic leukemia) (CMS-HCC)   07/18/1997 Initial Diagnosis    CLL (chronic lymphocytic leukemia)     Colon adenocarcinoma (CMS-HCC)   09/10/2022 -  Cancer Staged    Staging form: Colon and Rectum, AJCC 8th Edition  - Pathologic stage from 09/10/2022: pT4, pN0, cM0 - Signed by Elyse Hsu, MD on 11/03/2022       11/03/2022 Initial Diagnosis    Colon adenocarcinoma (CMS-HCC)         Past Medical History  Patient Active Problem List   Diagnosis    CLL (chronic lymphocytic leukemia) (CMS-HCC)    Iron deficiency anemia    Hypogammaglobulinemia (CMS-HCC)    Anemia    Need for prophylactic immunotherapy    Colon adenocarcinoma (CMS-HCC)     Past Surgical History   has a past surgical history that includes  Colonoscopy (2021); Esophagogastroduodenoscopy; Inguinal hernia repair; Cervical spine surgery (2012); Coronary angioplasty with stent (2014); Cataract extraction, bilateral; pr upper gi endoscopy,w/dir submuc inj (N/A, 08/10/2022); pr upper gi endoscopy,biopsy (N/A, 08/10/2022); and pr lap,surg,colectomy,w/remvl term ileum (N/A, 09/10/2022).    Allergies:   has no known allergies.      Medications:   has a current medication list which includes the following prescription(s): acalabrutinib, accu-chek guide test strips, albuterol 2.5 mg /3 mL (0.083 %) Nebu 3 mL, albuterol 5 mg/mL Nebu 0.5 mL, atorvastatin, bumetanide, cholecalciferol (vitamin d3-125 mcg (5,000 unit)), denta 5000 plus, doxazosin, empagliflozin, famotidine, fluticasone propionate, folic acid, glipizide, glipizide, glucosamine-chondroitin, hydralazine, ipratropium-albuterol, victoza 2-pak, metoprolol succinate, dulera, potassium chloride, spironolactone, sucralfate, vit c/e/zn/coppr/lutein/zeaxan, zafirlukast, bumetanide, clopidogrel, losartan, and tresiba flextouch u-100, and the following Facility-Administered Medications: acetaminophen, diphenhydramine, immun glob g(igg)-pro-iga 0-50 **FOLLOWED BY** immun glob g(igg)-pro-iga 0-50, sodium chloride.    Family History/Social History:  Reviewed and updated as appropriate.    Review of Systems:   As per interval history.  All other systems reviewed and negative.    Physical Exam:   Vitals: BP (S) 140/72  - Pulse 61  - Temp 36.3 ??C (97.3 ??F) (Temporal)  - Resp 16  - Ht 165.1 cm (5' 5)  - Wt 64.5 kg (142 lb 1.6 oz)  - SpO2 97%  - BMI 23.65 kg/m??   General:  Pleasant, no acute distress.  ECOG PS 2.   Pain:  Pain Evaluation:                                   Pain Score (0 - 1):                                Pain Location:                                       Eyes:  Appears normal.   HENT:  Atraumatic.   Neck:  thyroid midline.  Lymphatics: Bilateral small cervical lymph nodes.  No supraclavicular axillary or inguinal lymphadenopathy   Cardiovascular:   RRR, trace edema b/l ankles.   Respiratory:  Unlabored.  Good air entry b/l. No wheezing.  Mild crackles at the left lower base.    Gastrointestinal: Does not appear distended.  Nontender. No palpable HSM.   Skin:  No rashes or subcutaneous nodules.    Musculoskeletal:  Gait is steady with cane.   Psychiatric:  Affect appropriate. Judgment and insight nl.  Neurologic:  Alert and oriented x3. Grossly non-focal.            Pertinent Studies/Imaging     MRI Abdomen 02/18/2023  Impression: No evidence of metastatic disease in the abdomen     CT Chest Wo Contrast 02/18/2023   Impression: No evidence for new or progressive intrathoracic malignancy. Improved bibasal aeration with decreased juxtapleural reticular densities. Clustered reticulonodular densities within the lateral right middle and lower lobes, presumably infectious/inflammatory.     Labs:   Results for orders placed or performed in visit on 07/25/23   Comprehensive Metabolic Panel   Result Value Ref Range    Sodium 139 135 - 145 mmol/L    Potassium 4.8 3.5 - 5.0 mmol/L    Chloride 104 98 - 107 mmol/L    CO2  25.9 21.0 - 32.0 mmol/L    Anion Gap 9 3 - 11 mmol/L    BUN 52 (H) 8 - 20 mg/dL    Creatinine 6.04 (H) 0.80 - 1.30 mg/dL    BUN/Creatinine Ratio 31     eGFR CKD-EPI (2021) Male 40 (L) >=60 mL/min/1.35m2    Glucose 170 70 - 179 mg/dL    Calcium 9.2 8.5 - 54.0 mg/dL    Albumin 3.4 (L) 3.5 - 5.0 g/dL    Total Protein 6.1 6.0 - 8.0 g/dL    Total Bilirubin 0.4 0.2 - 1.0 mg/dL    AST 18 15 - 40 U/L    ALT 21 12 - 78 U/L    Alkaline Phosphatase 136 (H) 46 - 116 U/L   Lactate dehydrogenase   Result Value Ref Range    LDH 233 81 - 234 U/L   CBC w/ Differential   Result Value Ref Range    Results Verified by Slide Scan Slide Reviewed     WBC 6.0 3.6 - 11.2 10*9/L    RBC 4.32 4.26 - 5.60 10*12/L    HGB 11.9 (L) 12.9 - 16.5 g/dL    HCT 98.1 (L) 19.1 - 48.0 %    MCV 84.1 77.6 - 95.7 fL    MCH 27.6 25.9 - 32.4 pg    MCHC 32.9 32.0 - 36.0 g/dL    RDW 47.8 (H) 29.5 - 15.2 %    MPV 8.3 6.8 - 10.7 fL    Platelet 96 (L) 150 - 450 10*9/L    Neutrophils % 56.5 %    Lymphocytes % 33.0 %    Monocytes % 8.8 %    Eosinophils % 1.0 %    Basophils % 0.7 %    Absolute Neutrophils 3.4 1.8 - 7.8 10*9/L    Absolute Lymphocytes 2.0 1.1 - 3.6 10*9/L    Absolute Monocytes 0.5 0.3 - 0.8 10*9/L    Absolute Eosinophils 0.1 0.0 - 0.5 10*9/L    Absolute Basophils 0.0 0.0 - 0.1 10*9/L     I have independently reviewed notes from prior visits, results from labs and imaging.  The current plan of care requires intensive monitoring for toxicity due to use of antineoplastic treatments.  Medical decision making was of high complexity due to to ongoing risk of morbidity/mortality from antineoplastic treatment.      The patient reports that all of his questions were answered to his satisfaction today. he was encouraged to contact us for any questions that may arise prior to his next visit.Thank you for the opportunity to participate in Jaydrian Langlois's care.       Elyse Hsu, MD

## 2023-07-25 NOTE — Unmapped (Signed)
Pt here for IVIG.  Infusion completed, tolerated well, VSS.  Patient provided blankets, drinks, and snacks as needed throughout infusion.  Comfort continually assessed by staff.  PIV de-accessed per [place] protocol. Patient discharged in stable condition, ambulatory with cane.  Return to clinic information provided.

## 2023-07-25 NOTE — Unmapped (Signed)
Latest Reference Range & Units 07/25/23 08:00   WBC 3.6 - 11.2 10*9/L 6.0   RBC 4.26 - 5.60 10*12/L 4.32   HGB 12.9 - 16.5 g/dL 16.1 (L)   HCT 09.6 - 04.5 % 36.3 (L)   MCV 77.6 - 95.7 fL 84.1   MCH 25.9 - 32.4 pg 27.6   MCHC 32.0 - 36.0 g/dL 40.9   RDW 81.1 - 91.4 % 15.7 (H)   MPV 6.8 - 10.7 fL 8.3   Platelet 150 - 450 10*9/L 96 (L)   Neutrophils % % 56.5   Lymphocytes % % 33.0   Monocytes % % 8.8   Eosinophils % % 1.0   Basophils % % 0.7   Absolute Neutrophils 1.8 - 7.8 10*9/L 3.4   Absolute Lymphocytes 1.1 - 3.6 10*9/L 2.0   Absolute Monocytes  0.3 - 0.8 10*9/L 0.5   Absolute Eosinophils 0.0 - 0.5 10*9/L 0.1   Absolute Basophils  0.0 - 0.1 10*9/L 0.0   Sodium 135 - 145 mmol/L 139   Potassium 3.5 - 5.0 mmol/L 4.8   Chloride 98 - 107 mmol/L 104   CO2 21.0 - 32.0 mmol/L 25.9   Bun 8 - 20 mg/dL 52 (H)   Creatinine 7.82 - 1.30 mg/dL 9.56 (H)   BUN/Creatinine Ratio  31   eGFR CKD-EPI (2021) Male >=60 mL/min/1.105m2 40 (L)   Anion Gap 3 - 11 mmol/L 9   Glucose 70 - 179 mg/dL 213   Calcium 8.5 - 08.6 mg/dL 9.2   Albumin 3.5 - 5.0 g/dL 3.4 (L)   Total Protein 6.0 - 8.0 g/dL 6.1   Total Bilirubin 0.2 - 1.0 mg/dL 0.4   SGOT (AST) 15 - 40 U/L 18   ALT 12 - 78 U/L 21   Alkaline Phosphatase 46 - 116 U/L 136 (H)   LDH 81 - 234 U/L 233   (L): Data is abnormally low  (H): Data is abnormally high

## 2023-08-01 NOTE — Unmapped (Signed)
MyChart message sent to patient informing him of negative ctDNA result per provider's request.

## 2023-08-05 NOTE — Unmapped (Signed)
[facility] Specialty and Home Delivery Pharmacy Refill Coordination Note    Specialty Medication(s) to be Shipped:   Hematology/Oncology: Calquence 100mg     Other medication(s) to be shipped: No additional medications requested for fill at this time     Christian Abbott, DOB: 1935-01-25  Phone: [phone] (home)       All above HIPAA information was verified with  patient's daughter      Was a Nurse, learning disability used for this call? No    Completed refill call assessment today to schedule patient's medication shipment from the [facility] and [facility] Delivery Pharmacy  [phone]).  All relevant notes have been reviewed.     Specialty medication(s) and dose(s) confirmed: Regimen is correct and unchanged.   Changes to medications: Christian Abbott reports no changes at this time.  Changes to insurance: No  New side effects reported not previously addressed with a pharmacist or physician: None reported  Questions for the pharmacist: No    Confirmed patient received a Conservation officer, historic buildings and a Surveyor, mining with first shipment. The patient will receive a drug information handout for each medication shipped and additional FDA Medication Guides as required.       DISEASE/MEDICATION-SPECIFIC INFORMATION        N/A    SPECIALTY MEDICATION ADHERENCE     Medication Adherence    Patient reported X missed doses in the last month: 0  Specialty Medication: CALQUENCE 100 mg tablet (acalabrutinib)  Patient is on additional specialty medications: No  Informant: child/children              Were doses missed due to medication being on hold? No    Calquence 100 mg: 12 days of medicine on hand       REFERRAL TO PHARMACIST     Referral to the pharmacist: Not needed      [facility]     Shipping address confirmed in Epic.       Delivery Scheduled: Yes, Expected medication delivery date: 08/12/23.     Medication will be delivered via Next Day Courier to the prescription address in Epic WAM.    [name]   [facility] Specialty and Home Delivery Pharmacy  Specialty Technician

## 2023-08-11 MED FILL — CALQUENCE (ACALABRUTINIB MALEATE) 100 MG TABLET: ORAL | 30 days supply | Qty: 60 | Fill #5

## 2023-09-06 DIAGNOSIS — C9110 Chronic lymphocytic leukemia of B-cell type not having achieved remission: Principal | ICD-10-CM

## 2023-09-07 ENCOUNTER — Ambulatory Visit: Admit: 2023-09-07 | Discharge: 2023-09-08 | Payer: MEDICARE

## 2023-09-07 LAB — CBC W/ AUTO DIFF
HEMATOCRIT: 37.7 % — ABNORMAL LOW (ref 39.0–48.0)
HEMOGLOBIN: 12 g/dL — ABNORMAL LOW (ref 12.9–16.5)
MEAN CORPUSCULAR HEMOGLOBIN CONC: 31.9 g/dL — ABNORMAL LOW (ref 32.0–36.0)
MEAN CORPUSCULAR HEMOGLOBIN: 27.2 pg (ref 25.9–32.4)
MEAN CORPUSCULAR VOLUME: 85.4 fL (ref 77.6–95.7)
MEAN PLATELET VOLUME: 7.7 fL (ref 6.8–10.7)
PLATELET COUNT: 79 10*9/L — ABNORMAL LOW (ref 150–450)
RED BLOOD CELL COUNT: 4.42 10*12/L (ref 4.26–5.60)
RED CELL DISTRIBUTION WIDTH: 15.5 % — ABNORMAL HIGH (ref 12.2–15.2)
WBC ADJUSTED: 6.2 10*9/L (ref 3.6–11.2)

## 2023-09-07 LAB — COMPREHENSIVE METABOLIC PANEL
ALBUMIN: 3.3 g/dL — ABNORMAL LOW (ref 3.5–5.0)
ALKALINE PHOSPHATASE: 147 U/L — ABNORMAL HIGH (ref 46–116)
ALT (SGPT): 22 U/L (ref 12–78)
ANION GAP: 6 mmol/L (ref 3–11)
AST (SGOT): 11 U/L — ABNORMAL LOW (ref 15–40)
BILIRUBIN TOTAL: 0.5 mg/dL (ref 0.2–1.0)
BLOOD UREA NITROGEN: 68 mg/dL — ABNORMAL HIGH (ref 8–20)
BUN / CREAT RATIO: 36
CALCIUM: 8.6 mg/dL (ref 8.5–10.1)
CHLORIDE: 106 mmol/L (ref 98–107)
CO2: 26.9 mmol/L (ref 21.0–32.0)
CREATININE: 1.91 mg/dL — ABNORMAL HIGH (ref 0.80–1.30)
EGFR CKD-EPI (2021) MALE: 34 mL/min/{1.73_m2} — ABNORMAL LOW (ref >=60)
GLUCOSE RANDOM: 254 mg/dL — ABNORMAL HIGH (ref 70–179)
POTASSIUM: 4.4 mmol/L (ref 3.5–5.0)
PROTEIN TOTAL: 6.1 g/dL (ref 6.0–8.0)
SODIUM: 139 mmol/L (ref 135–145)

## 2023-09-07 LAB — MANUAL DIFFERENTIAL
BASOPHILS - ABS (DIFF): 0.1 10*9/L (ref 0.0–0.1)
BASOPHILS - REL (DIFF): 1 %
EOSINOPHILS - ABS (DIFF): 0.2 10*9/L (ref 0.0–0.5)
EOSINOPHILS - REL (DIFF): 3 %
LYMPHOCYTES - ABS (DIFF): 2.2 10*9/L (ref 1.1–3.6)
LYMPHOCYTES - REL (DIFF): 35 %
MONOCYTES - ABS (DIFF): 0.7 10*9/L (ref 0.3–0.8)
MONOCYTES - REL (DIFF): 12 %
NEUTROPHILS - ABS (DIFF): 3 10*9/L (ref 1.8–7.8)
NEUTROPHILS - REL (DIFF): 49 %

## 2023-09-07 LAB — CEA: CARCINOEMBRYONIC ANTIGEN: 2.2 ng/mL (ref 0.0–5.0)

## 2023-09-07 LAB — LACTATE DEHYDROGENASE: LACTATE DEHYDROGENASE: 206 U/L (ref 81–234)

## 2023-09-07 LAB — IGG: GAMMAGLOBULIN; IGG: 356 mg/dL — ABNORMAL LOW (ref 646–2013)

## 2023-09-07 NOTE — Unmapped (Signed)
Christian Abbott Established Patient Note     Patient Name: Christian Abbott   Date of Birth: 1935/05/25   Date of Encounter: 09/07/2023     PCP:  Christian Morton, MD      Assessment and Plan    # Chronic Lymphocytic Leukemia  --Rai stage IV CLL  --CLL FISH panel shows 13 q. deletion 21.5%, IGHV mutated -good prognostic features  --11/05/2020: TP53 mutation negative.  NOTCH1 negative.  SF3B1 positive.  SF3B1 positivity indicates poor prognosis.  -- no evidence of hemolysis.   --Indication for treatment:  advanced stage and history of thrombocytopenia  -- He has been treated with Rituxan many years ago by Dr Christian Abbott, with good response in the 1990s. Treatment indication at that time may have been frequent infections.  -- Started Acalabrutinib 12/19/20 - held 1 week prior to surgery on 09/10/22 and restarted on 10/09/2022.  -- Reviewed CBC, CMP. ALC 2.0.  Platelets 76.  Likely 2/2 Acalbrutinib . We will continue to monitor platelets for now.     PLAN:  -- Continue Acalabrutinib 100 mg twice daily    For upcoming colonoscopy 09/20/23  recommend stopping acalbrutinib 3 days prior and if no biopsy/resection performed okay to restart that evening. If  biopsy/resection performed resume 3 days after.   -- Last IVIG dose  in 07/2023 - next dose scheduled for 10/2023  -- RTC in 4 weeks for repeat labs and exam.       #Stage II Colon Adenocarcinoma  - Mucinous Features [pT4N0M0]  - Colonoscopy for evaluation of iron deficiency anemia revealed a transverse colon lesion consistent with adenocarcinoma --MMR deficient, MLH1 and PMS2 absent expression.  BRAF negative  MLH1 hypermethylation + c/with sporadic MMR-deficient colon adenoCA  -- Staging CT chest without contrast and CT abdomen and pelvis with contrast scheduled for today afternoon  -- 09/10/22 Partial colectomy   -- reviewed pathology in detail and discussed [facility] colon cancer nomogram which shows a >90 % 3 year and 5 year PFS   -- I explained the inherent resistance to 27fu with MMR-deficient colon cancers and I would not recommend adjuvant FOLFOX for him given his age ,and comorbidities. His PS is recovering well after surgery however he is still quite frail. PS 2.   -- Reviewed ctDNA results 10/01/22 low positive 0.02-->10/19/22  negative (0)  -- Although initial test was positive - this test was very low positive and ~ 3w later on 2/20 repeat testing was negative. This suggests low risk of distant metastasis.   -- Given age, comorbidites would recommend against Adjuvant FOLFOX.   --MRI Abdomen 02/18/2023 shows no evidence of metastatic disease in the abdomen   -- CT Chest Wo Contrast 02/18/2023 shows no evidence for new or progressive intrathoracic malignancy. Improved bibasal aeration with decreased juxtapleural reticular densities. Clustered reticulonodular densities within the lateral right middle and lower lobes, presumably infectious/inflammatory.   -- Reviewed recent CT chest and MRI abdomen/pelvis 07/2023 showed NED. Some changed noted in the LLL which could be inflammation/infection. Patient has remained asymptomatic without fever, cough, productive sputum.  -- Recent CT DNA testing 03/2023 negative. ctDNA drawn  07/23/2023 - negative.     PLAN:  We will continue ctDNA every 3 months until year 2 ~ 08/2024.   then transition to every 6 months for  year 3-5.    Next ctDNA due in 10/2023.   Continue surveillance visits at least every 3 months for first 2 years - however patient is being seen every  4-6 weeks for CLL.   Repeat  6 month interval CT Chest won contrast and MRI abdomen and Pelvis  with/without  ~ 01/2024. Ordered    Repeat colonoscopy due 08/2023 - scheduled 09/20/23    #Midline incisional hernia  -- Asymptomatic.  Fully reducible.  Not incarcerated.  -- I will discuss this with Dr. Elenore Abbott and make a  referral to general surgery if needed    # Iron Deficiency Anemia  -- He was noted to have iron deficiency in 2014 and has since received IV iron in the past and has been on oral iron 1 pill/day.   -- iron deficiency anemia/ACD in 07/2019 with hb down to 8.7 and ferritin 78, iron sat 12%  -- now s/p 1020 mg ferheme 07/2019 and early 10/2018 with hb improvement to 10.9 today.   --EGD and colonoscopy with Dr. Loreta Abbott 01/2020 revealed bleeding gastric ulcer, smaller antral ulcer with old blood. .  Currently being managed with sucralfate.  -- In June 2023 hemoglobin was down to 8.3.  He was given additional IV iron with Feraheme 510 mg x 2 doses  -- Seen by Gi and planned for EGD , pending cardiology clearance  -- S/p feraheme 510 mg x 2 doses 9/28-10/9  --S/p IV iron 08/17/2022 and 08/27/2022  -- hb stable at 12.0 today     # Hypogammaglobulinemia   --History level continues to be low however he has not had any more infections since we started him on IVIG. Before we started him on IVIG he has had multiple hospitalizations for pneumonias.  --Therefore we will continue IVIG for now every 3 months as he is tolerated this well.   -- next IVIG dose scheduled for today  -- next infusion requested for 10/2023      # Diastolic CHF with volume overload  -- Switched to bumex as prescribed by cardiology  -- Diuresis has bee associated with some degree of renal dysfunction in the last 5-6 months  -- Continue close follow-up with Cardiology    # CKD Stage IV  -- Following closely with [facility].  -- Cr stable, 1.9 today       Disposition:  RTC in 4 weeks.           Interval History   Christian Abbott presents for 4-week follow-up of chronic lymphocytic leukemia . Accompanied by daughter.     C/w calquence. Received IVIG last in 07/2023. MRI Abdomen 02/18/2023 shows no evidence of metastatic disease in the abdomen . CT Chest Wo Contrast 02/18/2023 shows no evidence for new or progressive intrathoracic malignancy. Improved bibasal aeration with decreased juxtapleural reticular densities. Clustered reticulonodular densities within the lateral right middle and lower lobes, presumably infectious/inflammatory.     CT chest lung with MRI abdomen pelvis 07/2023 showed no evidence of disease.  There was some inflammatory changes in the left lower lobe suggestive of inflammation/early infection.  However patient has remained asymptomatic.  No sick contacts.  No fevers productive phlegm, shortness of breath.  I suspect this could be artifactual versus related to atelectasis.    Accompanied by his daughter.  Continues to have chronic fatigue.  Denies new constipation diarrhea or frankly bloody or melenic stools.  No increased cough shortness of breath abdominal pain.     He has noticed a midline abdominal incisional hernia which is fully reducible and nonpainful.    ECOG PS 2.         Abbott History:  Hematology/Abbott History Overview Note  89 y/o male with long-standing history of chronic lymphocytic leukemia initially diagnosed in 1998  Status post Rituxan based therapy for 8 weeks during that time  History of anemia treated with Procrit off and on  History of hypertension diabetes asthma hypercholesterolemia.  History of hospitalization in January 2014 for bilateral pneumonia [facility] for 3 weeks.     CLL (chronic lymphocytic leukemia) (CMS-HCC)   07/18/1997 Initial Diagnosis    CLL (chronic lymphocytic leukemia)     Colon adenocarcinoma (CMS-HCC)   09/10/2022 -  Cancer Staged    Staging form: Colon and Rectum, AJCC 8th Edition  - Pathologic stage from 09/10/2022: pT4, pN0, cM0 - Signed by Elyse Hsu, MD on 11/03/2022       11/03/2022 Initial Diagnosis    Colon adenocarcinoma (CMS-HCC)         Past Medical History  Patient Active Problem List   Diagnosis    CLL (chronic lymphocytic leukemia) (CMS-HCC)    Iron deficiency anemia    Hypogammaglobulinemia (CMS-HCC)    Anemia    Need for prophylactic immunotherapy    Colon adenocarcinoma (CMS-HCC)     Past Surgical History   has a past surgical history that includes Colonoscopy (2021); Esophagogastroduodenoscopy; Inguinal hernia repair; Cervical spine surgery (2012); Coronary angioplasty with stent (2014); Cataract extraction, bilateral; pr upper gi endoscopy,w/dir submuc inj (N/A, 08/10/2022); pr upper gi endoscopy,biopsy (N/A, 08/10/2022); and pr lap,surg,colectomy,w/remvl term ileum (N/A, 09/10/2022).    Allergies:   has no known allergies.      Medications:   has a current medication list which includes the following prescription(s): acalabrutinib, accu-chek guide test strips, albuterol 2.5 mg /3 mL (0.083 %) Nebu 3 mL, albuterol 5 mg/mL Nebu 0.5 mL, atorvastatin, bumetanide, cholecalciferol (vitamin d3-125 mcg (5,000 unit)), cyclobenzaprine, denta 5000 plus, doxazosin, empagliflozin, famotidine, fluticasone propionate, folic acid, glipizide, glipizide, glucosamine-chondroitin, hydralazine, ipratropium-albuterol, victoza 2-pak, losartan, metoprolol succinate, dulera, peg-electrolyte soln, spironolactone, sucralfate, tresiba flextouch u-100, vit c/e/zn/coppr/lutein/zeaxan, zafirlukast, bumetanide, and clopidogrel.    Family History/Social History:  Reviewed and updated as appropriate.    Review of Systems:   As per interval history.  All other systems reviewed and negative.    Physical Exam:   Vitals: BP (S) 142/68  - Pulse 73  - Temp 36.4 ??C (97.6 ??F) (Temporal)  - Resp 16  - Ht 165.1 cm (5' 5)  - Wt 65.1 kg (143 lb 8 oz)  - SpO2 100%  - BMI 23.88 kg/m??   General:  Pleasant, no acute distress.  ECOG PS 2.   Pain:  Pain Evaluation:                                   Pain Score (0 - 1):                                Pain Location:                                       Eyes:  Appears normal.   HENT:  Atraumatic.   Neck:  thyroid midline.  Lymphatics: Bilateral small cervical lymph nodes.  No supraclavicular axillary or inguinal lymphadenopathy   Cardiovascular:   RRR, trace edema b/l ankles.   Respiratory:  Unlabored.  Good air entry b/l. No  wheezing.  Mild crackles at the left lower base.    Gastrointestinal: Nondistended.  Nontender. No palpable HSM.  Midline insicional hernia. Reducible. Nontender.   Skin:  No rashes or subcutaneous nodules.    Musculoskeletal:  Gait is steady with cane.   Psychiatric:  Affect appropriate. Judgment and insight nl.  Neurologic:  Alert and oriented x3. Grossly non-focal.            Pertinent Studies/Imaging     MRI Abdomen 02/18/2023  Impression: No evidence of metastatic disease in the abdomen     CT Chest Wo Contrast 02/18/2023   Impression: No evidence for new or progressive intrathoracic malignancy. Improved bibasal aeration with decreased juxtapleural reticular densities. Clustered reticulonodular densities within the lateral right middle and lower lobes, presumably infectious/inflammatory.     Labs:   Results for orders placed or performed in visit on 09/07/23   Comprehensive Metabolic Panel   Result Value Ref Range    Sodium 139 135 - 145 mmol/L    Potassium 4.4 3.5 - 5.0 mmol/L    Chloride 106 98 - 107 mmol/L    CO2 26.9 21.0 - 32.0 mmol/L    Anion Gap 6 3 - 11 mmol/L    BUN 68 (H) 8 - 20 mg/dL    Creatinine 0.98 (H) 0.80 - 1.30 mg/dL    BUN/Creatinine Ratio 36     eGFR CKD-EPI (2021) Male 34 (L) >=60 mL/min/1.49m2    Glucose 254 (H) 70 - 179 mg/dL    Calcium 8.6 8.5 - 11.9 mg/dL    Albumin 3.3 (L) 3.5 - 5.0 g/dL    Total Protein 6.1 6.0 - 8.0 g/dL    Total Bilirubin 0.5 0.2 - 1.0 mg/dL    AST 11 (L) 15 - 40 U/L    ALT 22 12 - 78 U/L    Alkaline Phosphatase 147 (H) 46 - 116 U/L   Lactate dehydrogenase   Result Value Ref Range    LDH 206 81 - 234 U/L   CBC w/ Differential   Result Value Ref Range    WBC 6.2 3.6 - 11.2 10*9/L    RBC 4.42 4.26 - 5.60 10*12/L    HGB 12.0 (L) 12.9 - 16.5 g/dL    HCT 14.7 (L) 82.9 - 48.0 %    MCV 85.4 77.6 - 95.7 fL    MCH 27.2 25.9 - 32.4 pg    MCHC 31.9 (L) 32.0 - 36.0 g/dL    RDW 56.2 (H) 13.0 - 15.2 %    MPV 7.7 6.8 - 10.7 fL    Platelet 79 (L) 150 - 450 10*9/L   Manual Differential   Result Value Ref Range    Neutrophils % 49 %    Lymphocytes % 35 %    Monocytes % 12 %    Eosinophils % 3 %    Basophils % 1 %    Absolute Neutrophils 3.0 1.8 - 7.8 10*9/L    Absolute Lymphocytes 2.2 1.1 - 3.6 10*9/L    Absolute Monocytes 0.7 0.3 - 0.8 10*9/L    Absolute Eosinophils 0.2 0.0 - 0.5 10*9/L    Absolute Basophils 0.1 0.0 - 0.1 10*9/L    Giant Platelets Present (A) Not Present    Toxic Granulation Present (A) Not Present    Smudge Cells       I have independently reviewed notes from prior visits, results from labs and imaging.  The current plan of care requires intensive monitoring for toxicity due to  use of antineoplastic treatments.  Medical decision making was of high complexity due to to ongoing risk of morbidity/mortality from antineoplastic treatment.      The patient reports that all of his questions were answered to his satisfaction today. he was encouraged to contact us for any questions that may arise prior to his next visit.Thank you for the opportunity to participate in Audon Padron's care.       Elyse Hsu, MD

## 2023-09-07 NOTE — Unmapped (Signed)
Your labs from today:   Latest Reference Range & Units 09/07/23 10:51   WBC 3.6 - 11.2 10*9/L 6.2   RBC 4.26 - 5.60 10*12/L 4.42   HGB 12.9 - 16.5 g/dL 02.7 (L)   HCT 25.3 - 66.4 % 37.7 (L)   MCV 77.6 - 95.7 fL 85.4   MCH 25.9 - 32.4 pg 27.2   MCHC 32.0 - 36.0 g/dL 40.3 (L)   RDW 47.4 - 25.9 % 15.5 (H)   MPV 6.8 - 10.7 fL 7.7   Platelet 150 - 450 10*9/L 79 (L)   Neutrophils % % 49   Lymphocytes % % 35   Monocytes % % 12   Eosinophils % % 3   Basophils % % 1   Absolute Neutrophils 1.8 - 7.8 10*9/L 3.0   Absolute Lymphocytes 1.1 - 3.6 10*9/L 2.2   Absolute Monocytes  0.3 - 0.8 10*9/L 0.7   Absolute Eosinophils 0.0 - 0.5 10*9/L 0.2   Absolute Basophils  0.0 - 0.1 10*9/L 0.1   Smudge Cells  See Comment   Giant PLTs Not Present  Present !   Toxic Granulation Not Present  Present !   Sodium 135 - 145 mmol/L 139   Potassium 3.5 - 5.0 mmol/L 4.4   Chloride 98 - 107 mmol/L 106   CO2 21.0 - 32.0 mmol/L 26.9   Bun 8 - 20 mg/dL 68 (H)   Creatinine 5.63 - 1.30 mg/dL 8.75 (H)   BUN/Creatinine Ratio  36   eGFR CKD-EPI (2021) Male >=60 mL/min/1.79m2 34 (L)   Anion Gap 3 - 11 mmol/L 6   Glucose 70 - 179 mg/dL 643 (H)   Calcium 8.5 - 10.1 mg/dL 8.6   Albumin 3.5 - 5.0 g/dL 3.3 (L)   Total Protein 6.0 - 8.0 g/dL 6.1   Total Bilirubin 0.2 - 1.0 mg/dL 0.5   SGOT (AST) 15 - 40 U/L 11 (L)   ALT 12 - 78 U/L 22   Alkaline Phosphatase 46 - 116 U/L 147 (H)   LDH 81 - 234 U/L 206   (L): Data is abnormally low  (H): Data is abnormally high  !: Data is abnormal

## 2023-09-12 DIAGNOSIS — C9110 Chronic lymphocytic leukemia of B-cell type not having achieved remission: Principal | ICD-10-CM

## 2023-09-12 MED ORDER — ACALABRUTINIB MALEATE 100 MG TABLET
ORAL_TABLET | Freq: Two times a day (BID) | ORAL | 5 refills | 30.00 days | Status: CP
Start: 2023-09-12 — End: 2023-09-12
  Filled 2023-09-15: qty 60, 30d supply, fill #0

## 2023-09-12 NOTE — Unmapped (Signed)
[facility] Refill Coordination Note    Specialty Medication(s) to be Shipped:   Hematology/Oncology: Calquence 100mg     Other medication(s) to be shipped: No additional medications requested for fill at this time     Christian Abbott, DOB: 1935-03-06  Phone: There are no phone numbers on file.      All above HIPAA information was verified with  patient's daughter      Was a Nurse, learning disability used for this call? No    Completed refill call assessment today to schedule patient's medication shipment from the [facility]  [phone]).  All relevant notes have been reviewed.     Specialty medication(s) and dose(s) confirmed: Regimen is correct and unchanged.   Changes to medications: Christian Abbott reports no changes at this time.  Changes to insurance: No  New side effects reported not previously addressed with a pharmacist or physician: None reported  Questions for the pharmacist: No    Confirmed patient received a Conservation officer, historic buildings and a Surveyor, mining with first shipment. The patient will receive a drug information handout for each medication shipped and additional FDA Medication Guides as required.       DISEASE/MEDICATION-SPECIFIC INFORMATION        N/A    SPECIALTY MEDICATION ADHERENCE     Medication Adherence    Patient reported X missed doses in the last month: 0  Specialty Medication: CALQUENCE 100 mg tablet (acalabrutinib)  Patient is on additional specialty medications: No  Informant: child/children              Were doses missed due to medication being on hold? No    Calquence 100 mg: 4 days of medicine on hand       REFERRAL TO PHARMACIST     Referral to the pharmacist: Not needed      [facility]     Shipping address confirmed in Epic.       Delivery Scheduled: Yes, Expected medication delivery date: 09/15/23.  However, Rx request for refills was sent to the provider as there are none remaining.     Medication will be delivered via Same Day Courier to the prescription address in Epic WAM.    Christian Abbott   [facility]  Specialty Technician

## 2023-09-13 ENCOUNTER — Ambulatory Visit: Admit: 2023-09-13 | Discharge: 2023-09-14 | Payer: MEDICARE

## 2023-09-13 DIAGNOSIS — C9110 Chronic lymphocytic leukemia of B-cell type not having achieved remission: Principal | ICD-10-CM

## 2023-09-20 ENCOUNTER — Encounter: Admit: 2023-09-20 | Discharge: 2023-09-20 | Payer: MEDICARE

## 2023-09-20 ENCOUNTER — Inpatient Hospital Stay: Admit: 2023-09-20 | Discharge: 2023-09-20 | Payer: MEDICARE

## 2023-09-20 MED ADMIN — sodium chloride (NS) 0.9 % infusion: INTRAVENOUS | @ 14:00:00 | Stop: 2023-09-20

## 2023-09-20 MED ADMIN — Propofol (DIPRIVAN) injection: INTRAVENOUS | @ 14:00:00 | Stop: 2023-09-20

## 2023-09-20 MED ADMIN — lidocaine (PF) (XYLOCAINE-MPF) 20 mg/mL (2 %) injection: INTRAVENOUS | @ 14:00:00 | Stop: 2023-09-20

## 2023-09-20 NOTE — Unmapped (Signed)
 Anesthesia Side Effects:                 Today we gave you an anesthetic or a sedative.     We require you have a responsible adult with you the rest of the day and overnight, for your safety.     You should NOT do the following for the next 24 hours or at any time while taking prescription pain medicine:  Do not drink alcohol  Do not sign legal documents  Do not drive a car or operate heavy machinery  Do not use power tools    You may have a very dry mouth and sore throat. Warm saltwater gargles, drinking cool or warm beverages may help to soothe your throat.     It is normal to feel tired and weak after receiving anesthesia. You should limit your activities today and rest at home.     After anesthesia, it is better to start with a light liquid diet such as soft drinks, tea, Jell-O or broth.  Next you can try soup and crackers.   Finally work up to [employer]. Please remember that it is very important for you to drink liquids during the next 24 hours.    The amount of pain you have may differ from what other people experience.  Your pain may be unpredictable. Call your surgeon if you cannot control your pain by using the pain medicine you were prescribed.

## 2023-09-20 NOTE — Unmapped (Signed)
 See Provation procedure note located in RESULTS REVIEWED section of chart under GI procedures.    Full report with photographs is in the MEDIA section of EPIC.  FOR REFERRING PHYSICIANS OUTSIDE OF [place]/EPIC, A FULL REPORT WITH PATHOLOGY (IF BIOPSIES TAKEN) WILL FOLLOW IN 1-2 WEEKS.

## 2023-10-01 DIAGNOSIS — D801 Nonfamilial hypogammaglobulinemia: Principal | ICD-10-CM

## 2023-10-05 DIAGNOSIS — C9110 Chronic lymphocytic leukemia of B-cell type not having achieved remission: Principal | ICD-10-CM

## 2023-10-05 DIAGNOSIS — C189 Malignant neoplasm of colon, unspecified: Principal | ICD-10-CM

## 2023-10-06 ENCOUNTER — Ambulatory Visit: Admit: 2023-10-06 | Discharge: 2023-10-07 | Payer: MEDICARE

## 2023-10-06 LAB — COMPREHENSIVE METABOLIC PANEL
ALBUMIN: 3.3 g/dL — ABNORMAL LOW (ref 3.5–5.0)
ALKALINE PHOSPHATASE: 118 U/L — ABNORMAL HIGH (ref 46–116)
ALT (SGPT): 20 U/L (ref 12–78)
ANION GAP: 8 mmol/L (ref 3–11)
AST (SGOT): 8 U/L — ABNORMAL LOW (ref 15–40)
BILIRUBIN TOTAL: 0.5 mg/dL (ref 0.2–1.0)
BLOOD UREA NITROGEN: 47 mg/dL — ABNORMAL HIGH (ref 8–20)
BUN / CREAT RATIO: 23
CALCIUM: 9.2 mg/dL (ref 8.5–10.1)
CHLORIDE: 104 mmol/L (ref 98–107)
CO2: 24.7 mmol/L (ref 21.0–32.0)
CREATININE: 2.04 mg/dL — ABNORMAL HIGH (ref 0.80–1.30)
EGFR CKD-EPI (2021) MALE: 31 mL/min/{1.73_m2} — ABNORMAL LOW (ref >=60)
GLUCOSE RANDOM: 236 mg/dL — ABNORMAL HIGH (ref 70–179)
POTASSIUM: 4 mmol/L (ref 3.5–5.0)
PROTEIN TOTAL: 6 g/dL (ref 6.0–8.0)
SODIUM: 137 mmol/L (ref 135–145)

## 2023-10-06 LAB — CBC W/ AUTO DIFF
HEMATOCRIT: 36.2 % — ABNORMAL LOW (ref 39.0–48.0)
HEMOGLOBIN: 11.8 g/dL — ABNORMAL LOW (ref 12.9–16.5)
MEAN CORPUSCULAR HEMOGLOBIN CONC: 32.5 g/dL (ref 32.0–36.0)
MEAN CORPUSCULAR HEMOGLOBIN: 27.3 pg (ref 25.9–32.4)
MEAN CORPUSCULAR VOLUME: 84.3 fL (ref 77.6–95.7)
MEAN PLATELET VOLUME: 8.3 fL (ref 6.8–10.7)
PLATELET COUNT: 90 10*9/L — ABNORMAL LOW (ref 150–450)
RED BLOOD CELL COUNT: 4.3 10*12/L (ref 4.26–5.60)
RED CELL DISTRIBUTION WIDTH: 15.4 % — ABNORMAL HIGH (ref 12.2–15.2)
WBC ADJUSTED: 5.9 10*9/L (ref 3.6–11.2)

## 2023-10-06 LAB — LACTATE DEHYDROGENASE: LACTATE DEHYDROGENASE: 190 U/L (ref 81–234)

## 2023-10-06 LAB — MANUAL DIFFERENTIAL
BASOPHILS - ABS (DIFF): 0.1 10*9/L (ref 0.0–0.1)
BASOPHILS - REL (DIFF): 1 %
EOSINOPHILS - ABS (DIFF): 0 10*9/L (ref 0.0–0.5)
EOSINOPHILS - REL (DIFF): 0 %
LYMPHOCYTES - ABS (DIFF): 2.4 10*9/L (ref 1.1–3.6)
LYMPHOCYTES - REL (DIFF): 40 %
MONOCYTES - ABS (DIFF): 0.5 10*9/L (ref 0.3–0.8)
MONOCYTES - REL (DIFF): 8 %
NEUTROPHILS - ABS (DIFF): 3 10*9/L (ref 1.8–7.8)
NEUTROPHILS - REL (DIFF): 51 %

## 2023-10-06 LAB — PRO-BNP: PRO-BNP: 4698 pg/mL — ABNORMAL HIGH (ref 0.0–450.0)

## 2023-10-06 LAB — IGG: GAMMAGLOBULIN; IGG: 284 mg/dL — ABNORMAL LOW (ref 646–2013)

## 2023-10-06 LAB — CEA: CARCINOEMBRYONIC ANTIGEN: <2 ng/mL (ref 0.0–5.0)

## 2023-10-06 NOTE — Unmapped (Signed)
PCP is calling to let us know that the patient is there and has tested positive for Covid. They would like to know if it is okay to give him Paxlovid because of potential drug interactions?  Provided the MD is in a patient room. I will call her back as soon as he comes out and has a response.  --    MD notified via secure chat @[id number] .

## 2023-10-06 NOTE — Unmapped (Signed)
[facility] Established Patient Note     Patient Name: Christian Abbott   Date of Birth: 1935/02/20   Date of Encounter: 10/06/2023     PCP:  Arturo Morton, MD      Assessment and Plan    # Chronic Lymphocytic Leukemia  --Rai stage IV CLL  --CLL FISH panel shows 13 q. deletion 21.5%, IGHV mutated -good prognostic features  --11/05/2020: TP53 mutation negative.  NOTCH1 negative.  SF3B1 positive.  SF3B1 positivity indicates poor prognosis.  -- no evidence of hemolysis.   --Indication for treatment:  advanced stage and history of thrombocytopenia  -- He has been treated with Rituxan many years ago by Dr Thedore Mins, with good response in the 1990s. Treatment indication at that time may have been frequent infections.  -- Started Acalabrutinib 12/19/20 - held 1 week prior to surgery on 09/10/22 and restarted on 10/09/2022.  -- Reviewed CBC, CMP.  Hb 11.8 ALC 2.4   Platelets improved to 90. Thrombocytopenia  Likely 2/2 Acalbrutinib .    PLAN:  -- Continue Acalabrutinib 100 mg twice daily    -- If CXR shows Pneumonia or viral panel + for flu recommended holding Acalbrutinib x 1 week.    -- if + flu recommend 5 days course of Tamiflu.   -- Last IVIG dose  in 07/2023 - next dose scheduled for 10/26/2023  -- RTC in 3 weeks for repeat labs and exam during IVIG infusion.          # Suspected respiratory viral illness  -- Symptoms suggest possible respiratory viral illness with increased fatigue nasal congestion cough with clear sputum and fever yesterday.  Current vital signs remained stable.    PLAN:  --  ordered CXR today - he will walk in at [facility] rad.   -- advised to speak with PCP - for Covid/flu test or respiratory viral panel. If not available advised to go to urgent care.   -- if + flu recommend 5 days course of Tamiflu.   -- discussed vaccinations noted below.         #Stage II Colon Adenocarcinoma  - Mucinous Features [pT4N0M0]  - Colonoscopy for evaluation of iron deficiency anemia revealed a transverse colon lesion consistent with adenocarcinoma --MMR deficient, MLH1 and PMS2 absent expression.  BRAF negative  MLH1 hypermethylation + c/with sporadic MMR-deficient colon adenoCA  -- Staging CT chest without contrast and CT abdomen and pelvis with contrast scheduled for today afternoon  -- 09/10/22 Partial colectomy   -- reviewed pathology in detail and discussed [facility] colon cancer nomogram which shows a >90 % 3 year and 5 year PFS   -- I explained the inherent resistance to 54fu with MMR-deficient colon cancers and I would not recommend adjuvant FOLFOX for him given his age ,and comorbidities. His PS is recovering well after surgery however he is still quite frail. PS 2.   -- Reviewed ctDNA results 10/01/22 low positive 0.02-->10/19/22  negative (0)  -- Although initial test was positive - this test was very low positive and ~ 3w later on 2/20 repeat testing was negative. This suggests low risk of distant metastasis.   -- Given age, comorbidites would recommend against Adjuvant FOLFOX.   --MRI Abdomen 02/18/2023 shows no evidence of metastatic disease in the abdomen   -- CT Chest Wo Contrast 02/18/2023 shows no evidence for new or progressive intrathoracic malignancy. Improved bibasal aeration with decreased juxtapleural reticular densities. Clustered reticulonodular densities within the lateral right middle and lower lobes, presumably  infectious/inflammatory.   -- Reviewed recent CT chest and MRI abdomen/pelvis 07/2023 showed NED. Some changed noted in the LLL which could be inflammation/infection. Patient has remained asymptomatic without fever, cough, productive sputum.  -- Recent CT DNA testing 03/2023 negative. ctDNA drawn  07/23/2023 - negative.   -- Repeat colonoscopy 08/2023 - clear.       PLAN:  We will continue ctDNA every 3 months until year 2 ~ 08/2024.   then transition to every 6 months for  year 3-5.    Next ctDNA due in 10/2023.   Continue surveillance visits at least every 3 months for first 2 years - however patient is being seen every 4-6 weeks for CLL.   Repeat  6 month interval CT Chest wo contrast and MRI abdomen and Pelvis  with/without  ~ 01/2024. scheduled     #Midline incisional hernia  -- Asymptomatic.  Fully reducible.  Not incarcerated.  -- CTM.     # History of iron deficiency anemia   -- He was noted to have iron deficiency in 2014 and has since received IV iron in the past and has been on oral iron 1 pill/day.   -- iron deficiency anemia/ACD in 07/2019 with hb down to 8.7 and ferritin 78, iron sat 12%  -- s/p 1020 mg ferheme 07/2019 and early 10/2018 with hb improvement to 10.9 tday.   --EGD and colonoscopy with Dr. Loreta Ave 01/2020 revealed bleeding gastric ulcer, smaller antral ulcer with old blood. .  Currently being managed with sucralfate.  -- In June 2023 hemoglobin was down to 8.3.  He was given additional IV iron with Feraheme 510 mg x 2 doses  -- S/p feraheme 510 mg x 2 doses 9/28-10/9  --S/p IV iron 08/17/2022 and 08/27/2022  -- hb stable at 11.8 today     # Hypogammaglobulinemia   --History level continues to be low however he has not had any more infections since we started him on IVIG. Before we started him on IVIG he has had multiple hospitalizations for pneumonias.  --Therefore we will continue IVIG for now every 3 months as he is tolerated this well.   -- next IVIG dose scheduled for today  -- next infusion  scheduled 10/26/23      # Diastolic CHF with volume overload  -- Switched to bumex as prescribed by cardiology  -- Diuresis has bee associated with some degree of renal dysfunction  -- Continue close follow-up with Cardiology  -- 10/06/23: Add proBNP. Given increased SOB/DOE.     # CKD Stage IV  -- Following closely with [facility].  -- Cr slightly higher than baseline at 2.    # Health maintenance  -- Covid booster 05/27/2023  -- Flu vaccine 05/27/2023  -- RSV vaccine ~07/2023        Disposition:  RTC in 3 weeks for IVIG with exam at chairside during infusion  RTC in ~ 7 weeks, 4 weeks after next visit           Interval History     Patient is here for a routine 4 week follow-up visit on a acalabrutinib for Rai stage IV CLL.     On Sunday this week he was at [facility] and thinks he may have been exposed to someone with a cold/respiratory illness.  By Tuesday he was feeling very poorly with nasal congestion watering eyes and cough and fatigue.  This cough is mildly productive of whitish sputum.  Yesterday he had a fever up  to 101.3 daughters report.  Has been experiencing more than usual dyspnea on exertion in the last 2 days.  No orthopnea or PND.  Over the last few days his appetite has decreased.    Vital signs today appear stable.  Weight stable at 140 pounds, heart rate 76, BP 155 x 67, respiratory rate 16 at baseline, 96% on room air.      ECOG PS 2.         Oncology History:  Hematology/Oncology History Overview Note   89 y/o male with long-standing history of chronic lymphocytic leukemia initially diagnosed in 1998  Status post Rituxan based therapy for 8 weeks during that time  History of anemia treated with Procrit off and on  History of hypertension diabetes asthma hypercholesterolemia.  History of hospitalization in January 2014 for bilateral pneumonia [facility] for 3 weeks.     CLL (chronic lymphocytic leukemia) (CMS-HCC)   07/18/1997 Initial Diagnosis    CLL (chronic lymphocytic leukemia)     Colon adenocarcinoma (CMS-HCC)   09/10/2022 -  Cancer Staged    Staging form: Colon and Rectum, AJCC 8th Edition  - Pathologic stage from 09/10/2022: pT4, pN0, cM0 - Signed by Elyse Hsu, MD on 11/03/2022       11/03/2022 Initial Diagnosis    Colon adenocarcinoma (CMS-HCC)         Past Medical History  Patient Active Problem List   Diagnosis    CLL (chronic lymphocytic leukemia) (CMS-HCC)    Iron deficiency anemia    Hypogammaglobulinemia (CMS-HCC)    Anemia    Need for prophylactic immunotherapy    Colon adenocarcinoma (CMS-HCC)    Thrombocytopenia (CMS-HCC)     Past Surgical History   has a past surgical history that includes Colonoscopy (2021); Esophagogastroduodenoscopy; Inguinal hernia repair; Cervical spine surgery (2012); Coronary angioplasty with stent (2014); Cataract extraction, bilateral; pr upper gi endoscopy,w/dir submuc inj (N/A, 08/10/2022); pr upper gi endoscopy,biopsy (N/A, 08/10/2022); pr lap,surg,colectomy,w/remvl term ileum (N/A, 09/10/2022); and pr colonoscopy flx dx w/collj spec when pfrmd (N/A, 09/20/2023).    Allergies:   has no known allergies.      Medications:   has a current medication list which includes the following prescription(s): acalabrutinib, accu-chek guide test strips, albuterol 2.5 mg /3 mL (0.083 %) Nebu 3 mL, albuterol 5 mg/mL Nebu 0.5 mL, atorvastatin, bumetanide, cholecalciferol (vitamin d3-125 mcg (5,000 unit)), clopidogrel, denta 5000 plus, doxazosin, empagliflozin, famotidine, fluticasone propionate, folic acid, glipizide, glipizide, glucosamine-chondroitin, hydralazine, ipratropium-albuterol, metoprolol succinate, dulera, peg-electrolyte soln, potassium chloride, rybelsus, spironolactone, sucralfate, vit c/e/zn/coppr/lutein/zeaxan, zafirlukast, aspirin, and victoza 2-pak.    Family History/Social History:  Reviewed and updated as appropriate.    Review of Systems:   As per interval history.  All other systems reviewed and negative.    Physical Exam:   Vitals: BP 155/67  - Pulse 76  - Temp 36.9 ??C (98.4 ??F) (Temporal)  - Resp 16  - Ht 159 cm (5' 2.6)  - Wt 63.8 kg (140 lb 9.6 oz)  - SpO2 96%  - BMI 25.23 kg/m??   General:  Pleasant, no acute distress.  ECOG PS 2.   Pain:  Pain Evaluation:                                   Pain Score (0 - 1):  Pain Location:                                       Eyes:  Appears normal.   HENT:  Atraumatic.   Neck:  thyroid midline.  Lymphatics: Bilateral small cervical lymph nodes.  No supraclavicular axillary or inguinal lymphadenopathy   Cardiovascular:   RRR, trace edema b/l ankles.   Respiratory:  Unlabored.  Good air entry b/l. No wheezing.  Mild crackles at the left lower base.    Gastrointestinal: Nondistended.  Nontender. No palpable HSM.  Midline insicional hernia. Reducible. Nontender.   Skin:  No rashes or subcutaneous nodules.    Musculoskeletal:  Gait is steady with cane.   Psychiatric:  Affect appropriate. Judgment and insight nl.  Neurologic:  Alert and oriented x3. Grossly non-focal.            Pertinent Studies/Imaging     MRI Abdomen 02/18/2023  Impression: No evidence of metastatic disease in the abdomen     CT Chest Wo Contrast 02/18/2023   Impression: No evidence for new or progressive intrathoracic malignancy. Improved bibasal aeration with decreased juxtapleural reticular densities. Clustered reticulonodular densities within the lateral right middle and lower lobes, presumably infectious/inflammatory.     Labs:   Results for orders placed or performed in visit on 10/06/23   Comprehensive Metabolic Panel   Result Value Ref Range    Sodium 137 135 - 145 mmol/L    Potassium 4.0 3.5 - 5.0 mmol/L    Chloride 104 98 - 107 mmol/L    CO2 24.7 21.0 - 32.0 mmol/L    Anion Gap 8 3 - 11 mmol/L    BUN 47 (H) 8 - 20 mg/dL    Creatinine 1.61 (H) 0.80 - 1.30 mg/dL    BUN/Creatinine Ratio 23     eGFR CKD-EPI (2021) Male 31 (L) >=60 mL/min/1.27m2    Glucose 236 (H) 70 - 179 mg/dL    Calcium 9.2 8.5 - 09.6 mg/dL    Albumin 3.3 (L) 3.5 - 5.0 g/dL    Total Protein 6.0 6.0 - 8.0 g/dL    Total Bilirubin 0.5 0.2 - 1.0 mg/dL    AST 8 (L) 15 - 40 U/L    ALT 20 12 - 78 U/L    Alkaline Phosphatase 118 (H) 46 - 116 U/L   Lactate dehydrogenase   Result Value Ref Range    LDH 190 81 - 234 U/L   CBC w/ Differential   Result Value Ref Range    Results Verified by Slide Scan Slide Reviewed     WBC 5.9 3.6 - 11.2 10*9/L    RBC 4.30 4.26 - 5.60 10*12/L    HGB 11.8 (L) 12.9 - 16.5 g/dL    HCT 04.5 (L) 40.9 - 48.0 %    MCV 84.3 77.6 - 95.7 fL    MCH 27.3 25.9 - 32.4 pg    MCHC 32.5 32.0 - 36.0 g/dL    RDW 81.1 (H) 91.4 - 15.2 %    MPV 8.3 6.8 - 10.7 fL    Platelet 90 (L) 150 - 450 10*9/L   Manual Differential   Result Value Ref Range    Neutrophils % 51 %    Lymphocytes % 40 %    Monocytes % 8 %    Eosinophils % 0 %    Basophils % 1 %    Absolute Neutrophils  3.0 1.8 - 7.8 10*9/L    Absolute Lymphocytes 2.4 1.1 - 3.6 10*9/L    Absolute Monocytes 0.5 0.3 - 0.8 10*9/L    Absolute Eosinophils 0.0 0.0 - 0.5 10*9/L    Absolute Basophils 0.1 0.0 - 0.1 10*9/L    Giant Platelets      Rouleaux Present (A) Not Present    Hypersegmented Neutrophils Present (A) Not Present     I have independently reviewed notes from prior visits, results from labs and imaging.  The current plan of care requires intensive monitoring for toxicity due to use of antineoplastic treatments.  Medical decision making was of high complexity due to to ongoing risk of morbidity/mortality from antineoplastic treatment.      The patient reports that all of his questions were answered to his satisfaction today. he was encouraged to contact us for any questions that may arise prior to his next visit.Thank you for the opportunity to participate in Lekeith Clinard's care.       Elyse Hsu, MD

## 2023-10-06 NOTE — Unmapped (Signed)
Called patient. Went to PCP today and was + for COVID-19.   He was prescribed Paxlovid 150/100 mg BID x 5 days and also given Azithromycin x 5 days  due to the CXR showed possible respiratory  infection vs edema. proBNP pending.   He will have a repeat CXR in 7 days  ordered by PCP office.   Recommended stopping Acalbrutinib x 7 days.     Elyse Hsu, MD  [facility]

## 2023-10-06 NOTE — Unmapped (Signed)
Your labs from today:     Latest Reference Range & Units 10/06/23 07:48   Slide Scan  Slide Reviewed   WBC 3.6 - 11.2 10*9/L 5.9   RBC 4.26 - 5.60 10*12/L 4.30   HGB 12.9 - 16.5 g/dL 16.1 (L)   HCT 09.6 - 04.5 % 36.2 (L)   MCV 77.6 - 95.7 fL 84.3   MCH 25.9 - 32.4 pg 27.3   MCHC 32.0 - 36.0 g/dL 40.9   RDW 81.1 - 91.4 % 15.4 (H)   MPV 6.8 - 10.7 fL 8.3   Platelet 150 - 450 10*9/L 90 (L)   Neutrophils % % 51   Lymphocytes % % 40   Monocytes % % 8   Eosinophils % % 0   Basophils % % 1   Absolute Neutrophils 1.8 - 7.8 10*9/L 3.0   Absolute Lymphocytes 1.1 - 3.6 10*9/L 2.4   Absolute Monocytes  0.3 - 0.8 10*9/L 0.5   Absolute Eosinophils 0.0 - 0.5 10*9/L 0.0   Absolute Basophils  0.0 - 0.1 10*9/L 0.1   Giant PLTs  See Comment   Rouleaux Not Present  Present !   Hypersegmented Neutrophils Not Present  Present !   Sodium 135 - 145 mmol/L 137   Potassium 3.5 - 5.0 mmol/L 4.0   Chloride 98 - 107 mmol/L 104   CO2 21.0 - 32.0 mmol/L 24.7   Bun 8 - 20 mg/dL 47 (H)   Creatinine 7.82 - 1.30 mg/dL 9.56 (H)   BUN/Creatinine Ratio  23   eGFR CKD-EPI (2021) Male >=60 mL/min/1.63m2 31 (L)   Anion Gap 3 - 11 mmol/L 8   Glucose 70 - 179 mg/dL 213 (H)   Calcium 8.5 - 10.1 mg/dL 9.2   Albumin 3.5 - 5.0 g/dL 3.3 (L)   Total Protein 6.0 - 8.0 g/dL 6.0   Total Bilirubin 0.2 - 1.0 mg/dL 0.5   SGOT (AST) 15 - 40 U/L 8 (L)   ALT 12 - 78 U/L 20   Alkaline Phosphatase 46 - 116 U/L 118 (H)   LDH 81 - 234 U/L 190   (L): Data is abnormally low  (H): Data is abnormally high  !: Data is abnormal

## 2023-10-07 NOTE — Unmapped (Signed)
Christian Hsu, MD  Christian Fall, RN  Please let patients daughter know pro-bnp is elevated. As discussed with daughter recommend seeing cardiology asap.    Called patient's daughter, Christian Abbott, and let her know that patient's Pro BNP was highly elevated (4698.0) and provider recommends that patient see his cardiologist asap.  Ms. Christian Abbott reported that they had just gotten home from the ER; patient diagnosed with Covid yesterday and last night was having difficulty breathing. CM asked if ER team was aware of lab results, specifically the Pro BNP, from earlier yesterday; she did not think that they were. Ms. Christian Abbott said that she would call cardiologist right away.

## 2023-10-10 NOTE — Unmapped (Signed)
[facility] Refill Coordination Note    Specialty Medication(s) to be Shipped:   Hematology/Oncology: Calquence 100mg     Other medication(s) to be shipped: No additional medications requested for fill at this time     Christian Abbott, DOB: Jan 12, 1935  Phone: There are no phone numbers on file.      All above HIPAA information was verified with  patient's daughter      Was a Nurse, learning disability used for this call? No    Completed refill call assessment today to schedule patient's medication shipment from the [facility]  [phone]).  All relevant notes have been reviewed.     Specialty medication(s) and dose(s) confirmed: Regimen is correct and unchanged.   Changes to medications: [name] reports no changes at this time.  Changes to insurance: No  New side effects reported not previously addressed with a pharmacist or physician: None reported  Questions for the pharmacist: No    Confirmed patient received a Conservation officer, historic buildings and a Surveyor, mining with first shipment. The patient will receive a drug information handout for each medication shipped and additional FDA Medication Guides as required.       DISEASE/MEDICATION-SPECIFIC INFORMATION        N/A    SPECIALTY MEDICATION ADHERENCE     Medication Adherence    Patient reported X missed doses in the last month: 0  Specialty Medication: CALQUENCE 100 mg tablet (acalabrutinib)  Patient is on additional specialty medications: No  Informant: child/children              Were doses missed due to medication being on hold? No    Calquence 100 mg: 10 days of medicine on hand    Medication is currently on hold, patient will restart on 10/13/23    REFERRAL TO PHARMACIST     Referral to the pharmacist: Not needed      [facility]     Shipping address confirmed in Epic.       Delivery Scheduled: Yes, Expected medication delivery date: 10/21/23.     Medication will be delivered via Next Day Courier to the prescription address in Epic WAM.    [name]   [facility]  Specialty Technician

## 2023-10-20 MED FILL — CALQUENCE (ACALABRUTINIB MALEATE) 100 MG TABLET: ORAL | 30 days supply | Qty: 60 | Fill #1

## 2023-10-25 DIAGNOSIS — D801 Nonfamilial hypogammaglobulinemia: Principal | ICD-10-CM

## 2023-10-25 DIAGNOSIS — D696 Thrombocytopenia, unspecified: Principal | ICD-10-CM

## 2023-10-25 DIAGNOSIS — D649 Anemia, unspecified: Principal | ICD-10-CM

## 2023-10-25 DIAGNOSIS — C9110 Chronic lymphocytic leukemia of B-cell type not having achieved remission: Principal | ICD-10-CM

## 2023-10-25 DIAGNOSIS — C189 Malignant neoplasm of colon, unspecified: Principal | ICD-10-CM

## 2023-10-25 NOTE — Unmapped (Signed)
 [facility] Established Patient Note     Patient Name: Christian Abbott   Date of Birth: 08-31-1934   Date of Encounter: 10/26/2023     PCP:  Arturo Morton, MD      Assessment and Plan    # Chronic Lymphocytic Leukemia  --Rai stage IV CLL  --CLL FISH panel shows 13 q. deletion 21.5%, IGHV mutated -good prognostic features  --11/05/2020: TP53 mutation negative.  NOTCH1 negative.  SF3B1 positive.  SF3B1 positivity indicates poor prognosis.  -- no evidence of hemolysis.   --Indication for treatment:  advanced stage and history of thrombocytopenia  -- He has been treated with Rituxan many years ago by Dr Thedore Mins, with good response in the 1990s. Treatment indication at that time may have been frequent infections.  -- Started Acalabrutinib 12/19/20 - held 1 week prior to surgery on 09/10/22 and restarted on 10/09/2022.  -- 10/26/2023 reviewed CBC, CMP.  Hb 12.1 ALC 1.3 platelets stable to improved at 97.  Thrombocytopenia likely secondary to acalabrutinib     PLAN:  -- Continue Acalabrutinib 100 mg twice daily   --Proceed with IVIG today  -- RTC next in 4 weeks        # Recent COVID-pneumonia  -- Status post 5 days of Paxlovid and azithromycin ordered by PCP.  -- Significant improvement in shortness of breath  and fatigue      #Stage II Colon Adenocarcinoma  - Mucinous Features [[id number]]  - Colonoscopy for evaluation of iron deficiency anemia revealed a transverse colon lesion consistent with adenocarcinoma --MMR deficient, MLH1 and PMS2 absent expression.  BRAF negative  MLH1 hypermethylation + c/with sporadic MMR-deficient colon adenoCA  -- Staging CT chest without contrast and CT abdomen and pelvis with contrast scheduled for today afternoon  -- 09/10/22 Partial colectomy   -- reviewed pathology in detail and discussed MSKCC colon cancer nomogram which shows a >90 % 3 year and 5 year PFS   -- I explained the inherent resistance to 97fu with MMR-deficient colon cancers and I would not recommend adjuvant FOLFOX for him given his age ,and comorbidities. His PS is recovering well after surgery however he is still quite frail. PS 2.   -- Reviewed ctDNA results 10/01/22 low positive 0.02-->10/19/22  negative (0)  -- Although initial test was positive - this test was very low positive and ~ 3w later on 2/20 repeat testing was negative. This suggests low risk of distant metastasis.   -- Given age, comorbidites would recommend against Adjuvant FOLFOX.   --MRI Abdomen 02/18/2023 shows no evidence of metastatic disease in the abdomen   -- CT Chest Wo Contrast 02/18/2023 shows no evidence for new or progressive intrathoracic malignancy. Improved bibasal aeration with decreased juxtapleural reticular densities. Clustered reticulonodular densities within the lateral right middle and lower lobes, presumably infectious/inflammatory.   -- Reviewed recent CT chest and MRI abdomen/pelvis 07/2023 showed NED. Some changed noted in the LLL which could be inflammation/infection. Patient has remained asymptomatic without fever, cough, productive sputum.  -- Recent CT DNA testing 03/2023 negative. ctDNA drawn  07/23/2023 - negative.   -- Repeat colonoscopy 08/2023 - clear.       PLAN:  We will continue ctDNA every 3 months until year 2 ~ 08/2024.   then transition to every 6 months for  year 3-5.    His CT DNA in February 2025 was negative   Continue surveillance visits at least every 3 months for first 2 years - however patient is being seen  every 4-6 weeks for CLL.   Repeat  6 month interval CT Chest wo contrast and MRI abdomen and Pelvis  with/without  ~ 01/2024. scheduled     #Midline incisional hernia  -- Asymptomatic.  Fully reducible.  Not incarcerated.  -- CTM.     # History of iron deficiency anemia   -- He was noted to have iron deficiency in 2014 and has since received IV iron in the past and has been on oral iron 1 pill/day.   -- iron deficiency anemia/ACD in 07/2019 with hb down to 8.7 and ferritin 78, iron sat 12%  -- s/p 1020 mg ferheme 07/2019 and early 10/2018 with hb improvement to 10.9 tday.   --EGD and colonoscopy with Dr. Loreta Ave 01/2020 revealed bleeding gastric ulcer, smaller antral ulcer with old blood. .  Currently being managed with sucralfate.  -- In June 2023 hemoglobin was down to 8.3.  He was given additional IV iron with Feraheme 510 mg x 2 doses  -- S/p feraheme 510 mg x 2 doses 9/28-10/9  --S/p IV iron 08/17/2022 and 08/27/2022  -- hb stable at 11.8 today     # Hypogammaglobulinemia   --History level continues to be low however he has not had any more infections since we started him on IVIG. Before we started him on IVIG he has had multiple hospitalizations for pneumonias.  --Therefore we will continue IVIG for now every 3 months as he is tolerated this well.   -- next IVIG dose scheduled for today      # Diastolic CHF with volume overload  -- Switched to bumex as prescribed by cardiology  -- Diuresis has bee associated with some degree of renal dysfunction  -- Continue close follow-up with Cardiology  -- 10/06/23: Add proBNP. Given increased SOB/DOE.     # CKD Stage IV  -- Following closely with [facility].  -- Cr slightly higher than baseline at 2.    # Health maintenance  -- Covid booster 05/27/2023  -- Flu vaccine 05/27/2023  -- RSV vaccine ~07/2023        Disposition:  RTC 4 weeks          Interval History     Patient is here for a routine follow-up visit on a acalabrutinib for Rai stage IV CLL.  He is being seen at chair side today receiving IVIG. He recently had a sick contact and then developed upper respiratory symptoms including nasal congestion watering eyes cough and then progressive fatigue.  He was recently tested positive for COVID and was given Paxlovid and azithromycin for 5 days by his PCP.  Reports today that he is gradually recovering but feels significantly better.  He held acalabrutinib for 7 days and then resumed 2 days  following completion of Paxlovid.    ECOG PS 2.         Oncology History:  Hematology/Oncology History Overview Note   89 y/o male with long-standing history of chronic lymphocytic leukemia initially diagnosed in 1998  Status post Rituxan based therapy for 8 weeks during that time  History of anemia treated with Procrit off and on  History of hypertension diabetes asthma hypercholesterolemia.  History of hospitalization in January 2014 for bilateral pneumonia [facility] for 3 weeks.     CLL (chronic lymphocytic leukemia) (CMS-HCC)   07/18/1997 Initial Diagnosis    CLL (chronic lymphocytic leukemia)     Colon adenocarcinoma (CMS-HCC)   09/10/2022 -  Cancer Staged    Staging  form: Colon and Rectum, AJCC 8th Edition  - Pathologic stage from 09/10/2022: pT4, pN0, cM0 - Signed by Elyse Hsu, MD on 11/03/2022       11/03/2022 Initial Diagnosis    Colon adenocarcinoma (CMS-HCC)         Past Medical History  Patient Active Problem List   Diagnosis    CLL (chronic lymphocytic leukemia) (CMS-HCC)    Iron deficiency anemia    Hypogammaglobulinemia (CMS-HCC)    Anemia    Need for prophylactic immunotherapy    Colon adenocarcinoma (CMS-HCC)    Thrombocytopenia (CMS-HCC)     Past Surgical History   has a past surgical history that includes Colonoscopy (2021); Esophagogastroduodenoscopy; Inguinal hernia repair; Cervical spine surgery (2012); Coronary angioplasty with stent (2014); Cataract extraction, bilateral; pr upper gi endoscopy,w/dir submuc inj (N/A, 08/10/2022); pr upper gi endoscopy,biopsy (N/A, 08/10/2022); pr lap,surg,colectomy,w/remvl term ileum (N/A, 09/10/2022); and pr colonoscopy flx dx w/collj spec when pfrmd (N/A, 09/20/2023).    Allergies:   has no known allergies.      Medications:   has a current medication list which includes the following prescription(s): acalabrutinib, accu-chek guide test strips, albuterol 2.5 mg /3 mL (0.083 %) Nebu 3 mL, albuterol 5 mg/mL Nebu 0.5 mL, aspirin, atorvastatin, bumetanide, cholecalciferol (vitamin d3-125 mcg (5,000 unit)), clopidogrel, denta 5000 plus, doxazosin, empagliflozin, famotidine, fluticasone propionate, folic acid, glipizide, glipizide, glucosamine-chondroitin, hydralazine, ipratropium-albuterol, victoza 2-pak, metoprolol succinate, dulera, peg-electrolyte soln, potassium chloride, rybelsus, spironolactone, sucralfate, vit c/e/zn/coppr/lutein/zeaxan, and zafirlukast.    Family History/Social History:  Reviewed and updated as appropriate.    Review of Systems:   As per interval history.  All other systems reviewed and negative.    Physical Exam:   Vitals: There were no vitals taken for this visit.  General:  Pleasant, no acute distress.  ECOG PS 2.   Pain:  Pain Evaluation:                                   Pain Score (0 - 1):                                Pain Location:                                       Eyes:  Appears normal.   HENT:  Atraumatic.   Neck:  thyroid midline.  Lymphatics: Bilateral small cervical lymph nodes.  No supraclavicular axillary or inguinal lymphadenopathy   Cardiovascular:   RRR, trace edema b/l ankles.   Respiratory:  Unlabored.  Good air entry b/l. No wheezing.  Mild crackles at the left lower base.    Gastrointestinal: Nondistended.  Nontender. No palpable HSM.  Midline insicional hernia. Reducible. Nontender.   Skin:  No rashes or subcutaneous nodules.    Musculoskeletal:  Gait is steady with cane.   Psychiatric:  Affect appropriate. Judgment and insight nl.  Neurologic:  Alert and oriented x3. Grossly non-focal.            Pertinent Studies/Imaging     MRI Abdomen 02/18/2023  Impression: No evidence of metastatic disease in the abdomen     CT Chest Wo Contrast 02/18/2023   Impression: No evidence for new or progressive intrathoracic malignancy. Improved bibasal aeration with decreased juxtapleural reticular densities. Clustered  reticulonodular densities within the lateral right middle and lower lobes, presumably infectious/inflammatory.     Labs:   Results for orders placed or performed in visit on 10/26/23 Comprehensive Metabolic Panel   Result Value Ref Range    Sodium 137 135 - 145 mmol/L    Potassium 4.3 3.5 - 5.0 mmol/L    Chloride 103 98 - 107 mmol/L    CO2 23.3 21.0 - 32.0 mmol/L    Anion Gap 11 3 - 11 mmol/L    BUN 56 (H) 8 - 20 mg/dL    Creatinine 2.84 (H) 0.80 - 1.30 mg/dL    BUN/Creatinine Ratio 29     eGFR CKD-EPI (2021) Male 34 (L) >=60 mL/min/1.58m2    Glucose 289 (H) 70 - 179 mg/dL    Calcium 9.1 8.5 - 13.2 mg/dL    Albumin 3.5 3.5 - 5.0 g/dL    Total Protein 5.9 (L) 6.0 - 8.0 g/dL    Total Bilirubin 0.6 0.2 - 1.0 mg/dL    AST 6 (L) 15 - 40 U/L    ALT 23 12 - 78 U/L    Alkaline Phosphatase 139 (H) 46 - 116 U/L   Lactate dehydrogenase   Result Value Ref Range    LDH 165 81 - 234 U/L   IgG   Result Value Ref Range    Total IgG 223 (L) 646 - 2,013 mg/dL   CBC w/ Differential   Result Value Ref Range    Results Verified by Slide Scan Slide Reviewed     WBC 8.7 3.6 - 11.2 10*9/L    RBC 4.41 4.26 - 5.60 10*12/L    HGB 12.1 (L) 12.9 - 16.5 g/dL    HCT 44.0 (L) 10.2 - 48.0 %    MCV 83.6 77.6 - 95.7 fL    MCH 27.4 25.9 - 32.4 pg    MCHC 32.7 32.0 - 36.0 g/dL    RDW 72.5 (H) 36.6 - 15.2 %    MPV 8.4 6.8 - 10.7 fL    Platelet 97 (L) 150 - 450 10*9/L    Neutrophil Left Shift Present (A) Not Present    Neutrophils % 75.5 %    Lymphocytes % 15.0 %    Monocytes % 8.9 %    Eosinophils % 0.2 %    Basophils % 0.4 %    Absolute Neutrophils 6.6 1.8 - 7.8 10*9/L    Absolute Lymphocytes 1.3 1.1 - 3.6 10*9/L    Absolute Monocytes 0.8 0.3 - 0.8 10*9/L    Absolute Eosinophils 0.0 0.0 - 0.5 10*9/L    Absolute Basophils 0.0 0.0 - 0.1 10*9/L     *Note: Due to a large number of results and/or encounters for the requested time period, some results have not been displayed. A complete set of results can be found in Results Review.     I have independently reviewed notes from prior visits, results from labs and imaging.  The current plan of care requires intensive monitoring for toxicity due to use of antineoplastic treatments. Medical decision making was of high complexity due to to ongoing risk of morbidity/mortality from antineoplastic treatment.      The patient reports that all of his questions were answered to his satisfaction today. he was encouraged to contact us for any questions that may arise prior to his next visit.Thank you for the opportunity to participate in Christian Abbott's care.       Elyse Hsu, MD

## 2023-10-26 ENCOUNTER — Ambulatory Visit: Admit: 2023-10-26 | Discharge: 2023-10-27 | Payer: MEDICARE

## 2023-10-26 ENCOUNTER — Encounter: Admit: 2023-10-26 | Discharge: 2023-10-27 | Payer: MEDICARE

## 2023-10-26 LAB — COMPREHENSIVE METABOLIC PANEL
ALBUMIN: 3.5 g/dL (ref 3.5–5.0)
ALKALINE PHOSPHATASE: 139 U/L — ABNORMAL HIGH (ref 46–116)
ALT (SGPT): 23 U/L (ref 12–78)
ANION GAP: 11 mmol/L (ref 3–11)
AST (SGOT): 6 U/L — ABNORMAL LOW (ref 15–40)
BILIRUBIN TOTAL: 0.6 mg/dL (ref 0.2–1.0)
BLOOD UREA NITROGEN: 56 mg/dL — ABNORMAL HIGH (ref 8–20)
BUN / CREAT RATIO: 29
CALCIUM: 9.1 mg/dL (ref 8.5–10.1)
CHLORIDE: 103 mmol/L (ref 98–107)
CO2: 23.3 mmol/L (ref 21.0–32.0)
CREATININE: 1.91 mg/dL — ABNORMAL HIGH (ref 0.80–1.30)
EGFR CKD-EPI (2021) MALE: 34 mL/min/{1.73_m2} — ABNORMAL LOW (ref >=60)
GLUCOSE RANDOM: 289 mg/dL — ABNORMAL HIGH (ref 70–179)
POTASSIUM: 4.3 mmol/L (ref 3.5–5.0)
PROTEIN TOTAL: 5.9 g/dL — ABNORMAL LOW (ref 6.0–8.0)
SODIUM: 137 mmol/L (ref 135–145)

## 2023-10-26 LAB — CBC W/ AUTO DIFF
BASOPHILS ABSOLUTE COUNT: 0 10*9/L (ref 0.0–0.1)
BASOPHILS RELATIVE PERCENT: 0.4 %
EOSINOPHILS ABSOLUTE COUNT: 0 10*9/L (ref 0.0–0.5)
EOSINOPHILS RELATIVE PERCENT: 0.2 %
HEMATOCRIT: 36.8 % — ABNORMAL LOW (ref 39.0–48.0)
HEMOGLOBIN: 12.1 g/dL — ABNORMAL LOW (ref 12.9–16.5)
LYMPHOCYTES ABSOLUTE COUNT: 1.3 10*9/L (ref 1.1–3.6)
LYMPHOCYTES RELATIVE PERCENT: 15 %
MEAN CORPUSCULAR HEMOGLOBIN CONC: 32.7 g/dL (ref 32.0–36.0)
MEAN CORPUSCULAR HEMOGLOBIN: 27.4 pg (ref 25.9–32.4)
MEAN CORPUSCULAR VOLUME: 83.6 fL (ref 77.6–95.7)
MEAN PLATELET VOLUME: 8.4 fL (ref 6.8–10.7)
MONOCYTES ABSOLUTE COUNT: 0.8 10*9/L (ref 0.3–0.8)
MONOCYTES RELATIVE PERCENT: 8.9 %
NEUTROPHILS ABSOLUTE COUNT: 6.6 10*9/L (ref 1.8–7.8)
NEUTROPHILS RELATIVE PERCENT: 75.5 %
PLATELET COUNT: 97 10*9/L — ABNORMAL LOW (ref 150–450)
RED BLOOD CELL COUNT: 4.41 10*12/L (ref 4.26–5.60)
RED CELL DISTRIBUTION WIDTH: 15.3 % — ABNORMAL HIGH (ref 12.2–15.2)
WBC ADJUSTED: 8.7 10*9/L (ref 3.6–11.2)

## 2023-10-26 LAB — IGG: GAMMAGLOBULIN; IGG: 223 mg/dL — ABNORMAL LOW (ref 646–2013)

## 2023-10-26 LAB — LACTATE DEHYDROGENASE: LACTATE DEHYDROGENASE: 165 U/L (ref 81–234)

## 2023-10-26 MED ADMIN — acetaminophen (TYLENOL) tablet 650 mg: 650 mg | ORAL | @ 14:00:00 | Stop: 2023-10-26

## 2023-10-26 MED ADMIN — diphenhydrAMINE (BENADRYL) capsule/tablet 25 mg: 25 mg | ORAL | @ 14:00:00 | Stop: 2023-10-26

## 2023-10-26 MED ADMIN — immun glob G(IgG)-pro-IgA 0-50 (PRIVIGEN) 10 % intravenous solution 5 g: 5 g | INTRAVENOUS | @ 14:00:00 | Stop: 2023-10-26

## 2023-10-26 MED ADMIN — immun glob G(IgG)-pro-IgA 0-50 (PRIVIGEN) 10 % intravenous solution 20 g: 20 g | INTRAVENOUS | @ 15:00:00 | Stop: 2023-10-26

## 2023-10-26 NOTE — Unmapped (Signed)
Infusion completed, tolerated well, VSS.  Patient provided blankets, drinks, and snacks as needed throughout infusion.  Comfort continually assessed by staff.  PIV removed per [place] protocol. Patient discharged in stable condition, ambulatory.  Return to clinic information provided.

## 2023-10-26 NOTE — Unmapped (Signed)
 Relayed pt results per MD Please let pt know blood ctDNA results remain negative

## 2023-11-14 NOTE — Unmapped (Signed)
 [facility] Clinical Assessment & Refill Coordination Note    Christian Abbott, DOB: 1935-07-29  Phone: There are no phone numbers on file.    All above HIPAA information was verified with patient's family member, daughter.     Was a Nurse, learning disability used for this call? No    Specialty Medication(s):   Hematology/Oncology: Calquence     Current Outpatient Medications   Medication Sig Dispense Refill    acalabrutinib (CALQUENCE) 100 mg tablet Take 1 tablet (100 mg total) by mouth Two (2) times a day. Swallow whole with water. Do not chew, crush, dissolve, or cut tablets. 60 tablet 5    ACCU-CHEK GUIDE TEST STRIPS Strp       albuterol 2.5 mg /3 mL (0.083 %) Nebu 3 mL, albuterol 5 mg/mL Nebu 0.5 mL Inhale 2.5 mg every six (6) hours.      aspirin (ECOTRIN) 81 MG tablet Take 1 tablet (81 mg total) by mouth daily. (Patient not taking: Reported on 10/06/2023)      atorvastatin (LIPITOR) 80 MG tablet Take 1 tablet (80 mg total) by mouth nightly.      bumetanide (BUMEX) 0.5 MG tablet Take 1 tablet (0.5 mg total) by mouth daily.      cholecalciferol, vitamin D3, 5,000 unit Tab Take 1 tablet (125 mcg total) by mouth daily.      clopidogrel (PLAVIX) 75 mg tablet Take 1 tablet (75 mg total) by mouth daily. Resume taking on 09/19/22 30 tablet 0    DENTA 5000 PLUS 1.1 % Crea Apply 1 Application to teeth daily.      doxazosin (CARDURA) 4 MG tablet Take 1 tablet (4 mg total) by mouth nightly.  1    empagliflozin (JARDIANCE) 10 mg tablet Take 1 tablet (10 mg total) by mouth daily.      famotidine (PEPCID) 40 MG tablet Take 1 tablet (40 mg total) by mouth two (2) times a day.      fluticasone propionate (FLONASE) 50 mcg/actuation nasal spray 2 sprays into each nostril daily as needed.      folic acid (FOLVITE) 800 MCG tablet Take 1 tablet (800 mcg total) by mouth daily.      glipiZIDE (GLUCOTROL XL) 2.5 MG 24 hr tablet Take 1 tablet (2.5 mg total) by mouth nightly.      glipiZIDE (GLUCOTROL XL) 5 MG 24 hr tablet Take 1 tablet (5 mg total) by mouth daily.      glucosamine-chondroitin (OSTEO BI-FLEX) 250-200 mg Tab Take 1 tablet by mouth daily.      hydrALAZINE (APRESOLINE) 50 MG tablet Take 1 tablet (50 mg total) by mouth Three (3) times a day.      ipratropium-albuterol (DUO-NEB) 0.5-2.5 mg/3 mL nebulizer 3 mL every four (4) hours as needed.  3    liraglutide (VICTOZA 2-PAK) 0.6 mg/0.1 mL (18 mg/3 mL) injection Inject 0.2 mL (1.2 mg total) under the skin nightly. (Patient not taking: Reported on 10/06/2023)      metoPROLOL succinate (TOPROL-XL) 50 MG 24 hr tablet Take 1 tablet (50 mg total) by mouth.      mometasone-formoterol (DULERA) 200-5 mcg/actuation HFAA Inhale 2 puffs two (2) times a day.      peg-electrolyte soln (NULYTELY) 420 gram SolR Take 4,000 mL by mouth.      potassium chloride (KLOR-CON) 10 MEQ CR tablet Take 1 tablet (10 mEq total) by mouth daily.      RYBELSUS 7 mg Tab Take 1 tablet by mouth daily.  spironolactone (ALDACTONE) 25 MG tablet Take 1 tablet (25 mg total) by mouth daily. 1/2 dose per cardiologist      sucralfate (CARAFATE) 100 mg/mL suspension Take 10 mL (1 g total) by mouth nightly. 420 mL 4    vit C/E/Zn/coppr/lutein/zeaxan (PRESERVISION AREDS 2 ORAL) Take 1 tablet by mouth two (2) times a day.      zafirlukast (ACCOLATE) 20 MG tablet Take 1 tablet (20 mg total) by mouth two (2) times a day.       No current facility-administered medications for this visit.        Changes to medications: [name] reports no changes at this time.    Medication list has been reviewed and updated in Epic: Yes    No Known Allergies    Changes to allergies: No    Allergies have been reviewed and updated in Epic: Yes    SPECIALTY MEDICATION ADHERENCE     Calquence 100 mg: 10 days of medicine on   Medication Adherence    Patient reported X missed doses in the last month: 0  Specialty Medication: Calquence 100mg           Specialty medication(s) dose(s) confirmed: Regimen is correct and unchanged.     Are there any concerns with adherence? No    Adherence counseling provided? Not needed    CLINICAL MANAGEMENT AND INTERVENTION      Clinical Benefit Assessment:    Do you feel the medicine is effective or helping your condition? Yes    Clinical Benefit counseling provided? Not needed    Adverse Effects Assessment:    Are you experiencing any side effects? No    Are you experiencing difficulty administering your medicine? No    Quality of Life Assessment:    Quality of Life      Oncology  1. What impact has your specialty medication had on the reduction of your daily pain or discomfort level?: None  2. On a scale of 1-10, how would you rate your ability to manage side effects associated with your specialty medication? (1=no issues, 10 = unable to take medication due to side effects): 1            How many days over the past month did your condition/medicaiton  keep you from your normal activities? For example, brushing your teeth or getting up in the morning. 0    Have you discussed this with your provider? Not needed    Acute Infection Status:    Acute infections noted within Epic:  No active infections    Patient reported infection: None    Therapy Appropriateness:    Is therapy appropriate based on current medication list, adverse reactions, adherence, clinical benefit and progress toward achieving therapeutic goals? Yes, therapy is appropriate and should be continued     Clinical Intervention:    Was an intervention completed as part of this clinical assessment? No    DISEASE/MEDICATION-SPECIFIC INFORMATION      N/A    Oncology: Is the patient receiving adequate infection prevention treatment? Not applicable  Does the patient have adequate nutritional support? Not applicable    PATIENT SPECIFIC NEEDS     Does the patient have any physical, cognitive, or cultural barriers? No    Is the patient high risk? Yes, patient is taking oral chemotherapy. Appropriateness of therapy as been assessed    Does the patient require physician intervention or other additional services (i.e., nutrition, smoking cessation, social work)? No    Does the  patient have an additional or emergency contact listed in their chart? Yes    SOCIAL DETERMINANTS OF HEALTH     At the [facility], we have learned that life circumstances - like trouble affording food, housing, utilities, or transportation can affect the health of many of our patients.   That is why we wanted to ask: are you currently experiencing any life circumstances that are negatively impacting your health and/or quality of life? No    Social Drivers of Health     Food Insecurity: No Food Insecurity (09/10/2022)    Hunger Vital Sign     Worried About Running Out of Food in the Last Year: Never true     Ran Out of Food in the Last Year: Never true   Tobacco Use: Low Risk  (10/12/2023)    Received from [facility]    Patient History     Smoking Tobacco Use: Never     Smokeless Tobacco Use: Never     Passive Exposure: Not on file   Transportation Needs: No Transportation Needs (09/10/2022)    PRAPARE - Transportation     Lack of Transportation (Medical): No     Lack of Transportation (Non-Medical): No   Alcohol Use: Not At Risk (09/10/2022)    Alcohol Use     How often do you have a drink containing alcohol?: Never     How many drinks containing alcohol do you have on a typical day when you are drinking?: Not on file     How often do you have 5 or more drinks on one occasion?: Never   Housing: Not on file   Physical Activity: Sufficiently Active (09/10/2022)    Exercise Vital Sign     Days of Exercise per Week: 7 days     Minutes of Exercise per Session: 50 min   Utilities: Not on file   Stress: Stress Concern Present (09/10/2022)    [employer] of Occupational Health - Occupational Stress Questionnaire     Feeling of Stress : To some extent   Interpersonal Safety: Not At Risk (09/10/2022)    Interpersonal Safety     Unsafe Where You Currently Live: No     Physically Hurt by Anyone: No     Abused by Anyone: No   Substance Use: Not on file (09/11/2023)   Intimate Partner Violence: Not At Risk (09/10/2022)    Humiliation, Afraid, Rape, and Kick questionnaire     Fear of Current or Ex-Partner: No     Emotionally Abused: No     Physically Abused: No     Sexually Abused: No   Social Connections: Moderately Integrated (09/10/2022)    Social Connection and Isolation Panel [NHANES]     Frequency of Communication with Friends and Family: More than three times a week     Frequency of Social Gatherings with Friends and Family: More than three times a week     Attends Religious Services: More than 4 times per year     Active Member of [employer] or Organizations: Yes     Attends Banker Meetings: More than 4 times per year     Marital Status: Widowed   Physicist, medical Strain: Low Risk  (09/10/2022)    Overall Financial Resource Strain (CARDIA)     Difficulty of Paying Living Expenses: Not hard at all   Depression: Not at risk (09/10/2022)    PHQ-2     PHQ-2 Score: 0   Internet  Connectivity: No Internet connectivity concern identified (09/10/2022)    Internet Connectivity     Do you have access to internet services: Yes     How do you connect to the internet: Personal Device at home     Is your internet connection strong enough for you to watch video on your device without major problems?: Yes     Do you have enough data to get through the month?: Yes     Does at least one of the devices have a camera that you can use for video chat?: Yes   Health Literacy: Low Risk  (09/10/2022)    Health Literacy     : Never       Would you be willing to receive help with any of the needs that you have identified today? Not applicable       SHIPPING     Specialty Medication(s) to be Shipped:   Hematology/Oncology: Calquence    Other medication(s) to be shipped: No additional medications requested for fill at this time     Changes to insurance: No    Cost and Payment: Patient has a $0 copay, payment information is not required.    Delivery Scheduled: Yes, Expected medication delivery date: 11/21/23.     Medication will be delivered via Same Day Courier to the confirmed prescription address in [facility]    The patient will receive a drug information handout for each medication shipped and additional FDA Medication Guides as required.  Verified that patient has previously received a Conservation officer, historic buildings and a Surveyor, mining.    The patient or caregiver noted above participated in the development of this care plan and knows that they can request review of or adjustments to the care plan at any time.      All of the patient's questions and concerns have been addressed.    Rollen Sox, [facility]   [facility] Specialty Pharmacist

## 2023-11-18 DIAGNOSIS — D801 Nonfamilial hypogammaglobulinemia: Principal | ICD-10-CM

## 2023-11-18 DIAGNOSIS — C9110 Chronic lymphocytic leukemia of B-cell type not having achieved remission: Principal | ICD-10-CM

## 2023-11-21 MED FILL — CALQUENCE (ACALABRUTINIB MALEATE) 100 MG TABLET: ORAL | 30 days supply | Qty: 60 | Fill #2

## 2023-11-22 NOTE — Unmapped (Signed)
  Hematology Oncology Established Patient Note     Patient Name: Christian Abbott   Date of Birth: Feb 10, 1935   Date of Encounter: 11/23/2023     PCP:  Christian Morton, MD      Assessment and Plan    # Chronic Lymphocytic Leukemia  --Rai stage IV CLL  --CLL FISH panel shows 13 q. deletion 21.5%, IGHV mutated -good prognostic features  --11/05/2020: TP53 mutation negative.  NOTCH1 negative.  SF3B1 positive.  SF3B1 positivity indicates poor prognosis.  -- no evidence of hemolysis.   --Indication for treatment:  advanced stage and history of thrombocytopenia  -- He has been treated with Rituxan many years ago by Dr Christian Abbott, with good response in the 1990s. Treatment indication at that time may have been frequent infections.  -- Started Acalabrutinib 12/19/20 - held 1 week prior to surgery on 09/10/22 and restarted on 10/09/2022.  -- 10/26/2023 reviewed CBC, CMP.  Hb 12.1 ALC 1.3 platelets stable to improved at 97.  Thrombocytopenia likely secondary to acalabrutinib     PLAN:  -- Continue Acalabrutinib 100 mg twice daily   --Proceed with IVIG today  -- RTC next in 4 weeks        # Recent COVID-pneumonia  -- Status post 5 days of Paxlovid and azithromycin ordered by PCP.  -- Significant improvement in shortness of breath  and fatigue      #Stage II Colon Adenocarcinoma  - Mucinous Features [pT4N0M0]  - Colonoscopy for evaluation of iron deficiency anemia revealed a transverse colon lesion consistent with adenocarcinoma --MMR deficient, MLH1 and PMS2 absent expression.  BRAF negative  MLH1 hypermethylation + c/with sporadic MMR-deficient colon adenoCA  -- Staging CT chest without contrast and CT abdomen and pelvis with contrast scheduled for today afternoon  -- 09/10/22 Partial colectomy   -- reviewed pathology in detail and discussed [facility] colon cancer nomogram which shows a >90 % 3 year and 5 year PFS   -- I explained the inherent resistance to 85fu with MMR-deficient colon cancers and I would not recommend adjuvant FOLFOX for him given his age ,and comorbidities. His PS is recovering well after surgery however he is still quite frail. PS 2.   -- Reviewed ctDNA results 10/01/22 low positive 0.02-->10/19/22  negative (0)  -- Although initial test was positive - this test was very low positive and ~ 3w later on 2/20 repeat testing was negative. This suggests low risk of distant metastasis.   -- Given age, comorbidites would recommend against Adjuvant FOLFOX.   --MRI Abdomen 02/18/2023 shows no evidence of metastatic disease in the abdomen   -- CT Chest Wo Contrast 02/18/2023 shows no evidence for new or progressive intrathoracic malignancy. Improved bibasal aeration with decreased juxtapleural reticular densities. Clustered reticulonodular densities within the lateral right middle and lower lobes, presumably infectious/inflammatory.   -- Reviewed recent CT chest and MRI abdomen/pelvis 07/2023 showed NED. Some changed noted in the LLL which could be inflammation/infection. Patient has remained asymptomatic without fever, cough, productive sputum.  -- Recent CT DNA testing 03/2023 negative. ctDNA drawn  07/23/2023 - negative.   -- Repeat colonoscopy 08/2023 - clear.       PLAN:  We will continue ctDNA every 3 months until year 2 ~ 08/2024.   then transition to every 6 months for  year 3-5.    His CT DNA in February 2025 was negative   Continue surveillance visits at least every 3 months for first 2 years - however patient is being seen  every 4-6 weeks for CLL.   Repeat  6 month interval CT Chest wo contrast and MRI abdomen and Pelvis  with/without  ~ 01/2024. scheduled     # cold intolerance:  -- ordered TSH T3 T4 today    #Midline incisional hernia  -- Asymptomatic.  Fully reducible.  Not incarcerated.  -- CTM.     # History of iron deficiency anemia   -- He was noted to have iron deficiency in 2014 and has since received IV iron in the past and has been on oral iron 1 pill/day.   -- iron deficiency anemia/ACD in 07/2019 with hb down to 8.7 and ferritin 78, iron sat 12%  -- s/p 1020 mg ferheme 07/2019 and early 10/2018 with hb improvement to 10.9 tday.   --EGD and colonoscopy with Dr. Loreta Abbott 01/2020 revealed bleeding gastric ulcer, smaller antral ulcer with old blood. .  Currently being managed with sucralfate.  -- In June 2023 hemoglobin was down to 8.3.  He was given additional IV iron with Feraheme 510 mg x 2 doses  -- S/p feraheme 510 mg x 2 doses 9/28-10/9  --S/p IV iron 08/17/2022 and 08/27/2022  -- hb stable at 12.1    # Hypogammaglobulinemia   --History level continues to be low however he has not had any more infections since we started him on IVIG. Before we started him on IVIG he has had multiple hospitalizations for pneumonias.  --Therefore we will continue IVIG for now every 3 months as he is tolerated this well.   -- next IVIG dose  due in late May      # Diastolic CHF  -- Switched to bumex as prescribed by cardiology  -- Diuresis has been associated with some degree of renal dysfunction  -- Continue close follow-up with Cardiology    # CKD Stage IV  -- Following closely with [facility].  -- Cr slightly higher than baseline at 2.    # Health maintenance  -- Covid booster 05/27/2023  -- Flu vaccine 05/27/2023  -- RSV vaccine ~07/2023  -- Covid booster recently ~ 10/2023      Disposition:  RTC 4 weeks        Interval History   History of Present Illness  Christian Abbott is an 89 year old male with chronic lymphocytic leukemia and Stage II colon cancer who presents for management of these conditions. He is accompanied by his family members.     He is currently being treated for chronic lymphocytic leukemia (CLL) with acalabrutinib 100 mg twice daily. His lymphocyte count has increased slightly from 1.3 to 2.4 but remains within the normal range. His white blood cell count is normal, hemoglobin is stable at 12.0, and platelets have increased to 103. He experiences fatigue, which has worsened recently, and reports a dry mouth and throat, particularly at night, which he manages with medication to keep his mouth moist. He also feels very cold, often wearing a jacket indoors, and has experienced episodes of shivering at night, occurring two to three times, with the first episode about a month ago. His weight  is stable at 140.     He has a history of colon cancer, with the last colonoscopy in January 2025 being clear. He is under surveillance with CT DNA testing every three months, with the last test in February being negative.    His kidney function shows a fluctuating eGFR, currently at 31, down from 34, with a creatinine level slightly increased to  2.05 from 1.91. His liver function is normal.    He receives IVIG every three months. Next due 12/2023          Oncology History:  Hematology/Oncology History Overview Note   89 y/o male with long-standing history of chronic lymphocytic leukemia initially diagnosed in 1998  Status post Rituxan based therapy for 8 weeks during that time  History of anemia treated with Procrit off and on  History of hypertension diabetes asthma hypercholesterolemia.  History of hospitalization in January 2014 for bilateral pneumonia [facility] for 3 weeks.     CLL (chronic lymphocytic leukemia)   07/18/1997 Initial Diagnosis    CLL (chronic lymphocytic leukemia)     Colon adenocarcinoma   09/10/2022 -  Cancer Staged    Staging form: Colon and Rectum, AJCC 8th Edition  - Pathologic stage from 09/10/2022: pT4, pN0, cM0 - Signed by Elyse Hsu, MD on 11/03/2022       11/03/2022 Initial Diagnosis    Colon adenocarcinoma (CMS-HCC)         Past Medical History  Patient Active Problem List   Diagnosis    CLL (chronic lymphocytic leukemia)    Iron deficiency anemia    Hypogammaglobulinemia    Anemia    Need for prophylactic immunotherapy    Colon adenocarcinoma    Thrombocytopenia     Past Surgical History   has a past surgical history that includes Colonoscopy (2021); Esophagogastroduodenoscopy; Inguinal hernia repair; Cervical spine surgery (2012); Coronary angioplasty with stent (2014); Cataract extraction, bilateral; pr upper gi endoscopy,w/dir submuc inj (N/A, 08/10/2022); pr upper gi endoscopy,biopsy (N/A, 08/10/2022); pr lap,surg,colectomy,w/remvl term ileum (N/A, 09/10/2022); and pr colonoscopy flx dx w/collj spec when pfrmd (N/A, 09/20/2023).    Allergies:   has no known allergies.      Medications:   has a current medication list which includes the following prescription(s): acalabrutinib, accu-chek guide test strips, albuterol 2.5 mg /3 mL (0.083 %) Nebu 3 mL, albuterol 5 mg/mL Nebu 0.5 mL, aspirin, atorvastatin, bumetanide, cholecalciferol (vitamin d3-125 mcg (5,000 unit)), clopidogrel, denta 5000 plus, doxazosin, empagliflozin, famotidine, fluticasone propionate, folic acid, glipizide, glipizide, glucosamine-chondroitin, hydralazine, ipratropium-albuterol, victoza 2-pak, metoprolol succinate, dulera, peg-electrolyte soln, potassium chloride, rybelsus, spironolactone, sucralfate, vit c/e/zn/coppr/lutein/zeaxan, and zafirlukast.    Family History/Social History:  Reviewed and updated as appropriate.    Review of Systems:   As per interval history.  All other systems reviewed and negative.    Physical Exam:   Vitals: There were no vitals taken for this visit.  General:  Pleasant, no acute distress.  ECOG PS 2.   Pain:  Pain Evaluation:                                   Pain Score (0 - 1):                                Pain Location:                                       Eyes:  Appears normal.   HENT:  Atraumatic.   Neck:  thyroid midline.  Lymphatics: Bilateral small cervical lymph nodes.  No supraclavicular axillary or inguinal lymphadenopathy   Cardiovascular:   RRR, trace edema b/l ankles.   Respiratory:  Unlabored.  Good air entry b/l. No wheezing.  Mild crackles at the left lower base.    Gastrointestinal: Nondistended.  Nontender. No palpable HSM.  Midline insicional hernia. Reducible. Nontender. Skin:  No rashes or subcutaneous nodules.    Musculoskeletal:  Gait is steady with cane.   Psychiatric:  Affect appropriate. Judgment and insight nl.  Neurologic:  Alert and oriented x3. Grossly non-focal.            Pertinent Studies/Imaging     MRI Abdomen 02/18/2023  Impression: No evidence of metastatic disease in the abdomen     CT Chest Wo Contrast 02/18/2023   Impression: No evidence for new or progressive intrathoracic malignancy. Improved bibasal aeration with decreased juxtapleural reticular densities. Clustered reticulonodular densities within the lateral right middle and lower lobes, presumably infectious/inflammatory.     Labs:   Results for orders placed or performed in visit on 10/26/23   Comprehensive Metabolic Panel   Result Value Ref Range    Sodium 137 135 - 145 mmol/L    Potassium 4.3 3.5 - 5.0 mmol/L    Chloride 103 98 - 107 mmol/L    CO2 23.3 21.0 - 32.0 mmol/L    Anion Gap 11 3 - 11 mmol/L    BUN 56 (H) 8 - 20 mg/dL    Creatinine 9.81 (H) 0.80 - 1.30 mg/dL    BUN/Creatinine Ratio 29     eGFR CKD-EPI (2021) Male 34 (L) >=60 mL/min/1.39m2    Glucose 289 (H) 70 - 179 mg/dL    Calcium 9.1 8.5 - 19.1 mg/dL    Albumin 3.5 3.5 - 5.0 g/dL    Total Protein 5.9 (L) 6.0 - 8.0 g/dL    Total Bilirubin 0.6 0.2 - 1.0 mg/dL    AST 6 (L) 15 - 40 U/L    ALT 23 12 - 78 U/L    Alkaline Phosphatase 139 (H) 46 - 116 U/L   Lactate dehydrogenase   Result Value Ref Range    LDH 165 81 - 234 U/L   IgG   Result Value Ref Range    Total IgG 223 (L) 646 - 2,013 mg/dL   CBC w/ Differential   Result Value Ref Range    Results Verified by Slide Scan Slide Reviewed     WBC 8.7 3.6 - 11.2 10*9/L    RBC 4.41 4.26 - 5.60 10*12/L    HGB 12.1 (L) 12.9 - 16.5 g/dL    HCT 47.8 (L) 29.5 - 48.0 %    MCV 83.6 77.6 - 95.7 fL    MCH 27.4 25.9 - 32.4 pg    MCHC 32.7 32.0 - 36.0 g/dL    RDW 62.1 (H) 30.8 - 15.2 %    MPV 8.4 6.8 - 10.7 fL    Platelet 97 (L) 150 - 450 10*9/L    Neutrophil Left Shift Present (A) Not Present    Neutrophils % 75.5 %    Lymphocytes % 15.0 %    Monocytes % 8.9 %    Eosinophils % 0.2 %    Basophils % 0.4 %    Absolute Neutrophils 6.6 1.8 - 7.8 10*9/L    Absolute Lymphocytes 1.3 1.1 - 3.6 10*9/L    Absolute Monocytes 0.8 0.3 - 0.8 10*9/L    Absolute Eosinophils 0.0 0.0 - 0.5 10*9/L    Absolute Basophils 0.0 0.0 - 0.1 10*9/L     *Note: Due to a large number of results and/or encounters for the requested time period, some  results have not been displayed. A complete set of results can be found in Results Review.     I have independently reviewed notes from prior visits, results from labs and imaging.  The current plan of care requires intensive monitoring for toxicity due to use of antineoplastic treatments.  Medical decision making was of high complexity due to to ongoing risk of morbidity/mortality from antineoplastic treatment.      The patient reports that all of his questions were answered to his satisfaction today. he was encouraged to contact us for any questions that may arise prior to his next visit.Thank you for the opportunity to participate in Randell Marinaro's care.       Elyse Hsu, MD

## 2023-11-23 ENCOUNTER — Ambulatory Visit: Admit: 2023-11-23 | Discharge: 2023-11-23 | Payer: MEDICARE

## 2023-11-23 DIAGNOSIS — D801 Nonfamilial hypogammaglobulinemia: Principal | ICD-10-CM

## 2023-11-23 DIAGNOSIS — R6889 Other general symptoms and signs: Principal | ICD-10-CM

## 2023-11-23 DIAGNOSIS — C9110 Chronic lymphocytic leukemia of B-cell type not having achieved remission: Principal | ICD-10-CM

## 2023-11-23 LAB — CBC W/ AUTO DIFF
BASOPHILS ABSOLUTE COUNT: 0.1 10*9/L (ref 0.0–0.1)
BASOPHILS RELATIVE PERCENT: 0.5 %
EOSINOPHILS ABSOLUTE COUNT: 0 10*9/L (ref 0.0–0.5)
EOSINOPHILS RELATIVE PERCENT: 0.2 %
HEMATOCRIT: 37.3 % — ABNORMAL LOW (ref 39.0–48.0)
HEMOGLOBIN: 12 g/dL — ABNORMAL LOW (ref 12.9–16.5)
LYMPHOCYTES ABSOLUTE COUNT: 2.4 10*9/L (ref 1.1–3.6)
LYMPHOCYTES RELATIVE PERCENT: 24.2 %
MEAN CORPUSCULAR HEMOGLOBIN CONC: 32.2 g/dL (ref 32.0–36.0)
MEAN CORPUSCULAR HEMOGLOBIN: 26.9 pg (ref 25.9–32.4)
MEAN CORPUSCULAR VOLUME: 83.6 fL (ref 77.6–95.7)
MEAN PLATELET VOLUME: 8.2 fL (ref 6.8–10.7)
MONOCYTES ABSOLUTE COUNT: 0.9 10*9/L — ABNORMAL HIGH (ref 0.3–0.8)
MONOCYTES RELATIVE PERCENT: 9.7 %
NEUTROPHILS ABSOLUTE COUNT: 6.4 10*9/L (ref 1.8–7.8)
NEUTROPHILS RELATIVE PERCENT: 65.4 %
PLATELET COUNT: 103 10*9/L — ABNORMAL LOW (ref 150–450)
RED BLOOD CELL COUNT: 4.46 10*12/L (ref 4.26–5.60)
RED CELL DISTRIBUTION WIDTH: 15.8 % — ABNORMAL HIGH (ref 12.2–15.2)
WBC ADJUSTED: 9.8 10*9/L (ref 3.6–11.2)

## 2023-11-23 LAB — COMPREHENSIVE METABOLIC PANEL
ALBUMIN: 3.5 g/dL (ref 3.5–5.0)
ALKALINE PHOSPHATASE: 134 U/L — ABNORMAL HIGH (ref 46–116)
ALT (SGPT): 26 U/L (ref 12–78)
ANION GAP: 11 mmol/L (ref 3–11)
AST (SGOT): 20 U/L (ref 15–40)
BILIRUBIN TOTAL: 0.4 mg/dL (ref 0.2–1.0)
BLOOD UREA NITROGEN: 65 mg/dL — ABNORMAL HIGH (ref 8–20)
BUN / CREAT RATIO: 32
CALCIUM: 8.5 mg/dL (ref 8.5–10.1)
CHLORIDE: 103 mmol/L (ref 98–107)
CO2: 23.9 mmol/L (ref 21.0–32.0)
CREATININE: 2.05 mg/dL — ABNORMAL HIGH (ref 0.80–1.30)
EGFR CKD-EPI (2021) MALE: 31 mL/min/{1.73_m2} — ABNORMAL LOW (ref >=60)
GLUCOSE RANDOM: 247 mg/dL — ABNORMAL HIGH (ref 70–179)
POTASSIUM: 4.5 mmol/L (ref 3.5–5.0)
PROTEIN TOTAL: 6 g/dL (ref 6.0–8.0)
SODIUM: 138 mmol/L (ref 135–145)

## 2023-11-23 LAB — T4, FREE: FREE T4: 1.14 ng/dL (ref 0.89–1.76)

## 2023-11-23 LAB — IGG: GAMMAGLOBULIN; IGG: 455 mg/dL — ABNORMAL LOW (ref 646–2013)

## 2023-11-23 LAB — LACTATE DEHYDROGENASE: LACTATE DEHYDROGENASE: 200 U/L (ref 81–234)

## 2023-11-23 LAB — TSH: THYROID STIMULATING HORMONE: 1.309 u[IU]/mL (ref 0.550–4.780)

## 2023-11-23 LAB — T3: T3 TOTAL: 70.6 ng/dL (ref 60.0–180.0)

## 2023-11-23 NOTE — Unmapped (Signed)
 You labs from today:      Latest Reference Range & Units 11/23/23 07:46   WBC 3.6 - 11.2 10*9/L 9.8   RBC 4.26 - 5.60 10*12/L 4.46   HGB 12.9 - 16.5 g/dL 64.4 (L)   HCT 03.4 - 74.2 % 37.3 (L)   MCV 77.6 - 95.7 fL 83.6   MCH 25.9 - 32.4 pg 26.9   MCHC 32.0 - 36.0 g/dL 59.5   RDW 63.8 - 75.6 % 15.8 (H)   MPV 6.8 - 10.7 fL 8.2   Platelet 150 - 450 10*9/L 103 (L)   Neutrophils % % 65.4   Lymphocytes % % 24.2   Monocytes % % 9.7   Eosinophils % % 0.2   Basophils % % 0.5   Absolute Neutrophils 1.8 - 7.8 10*9/L 6.4   Absolute Lymphocytes 1.1 - 3.6 10*9/L 2.4   Absolute Monocytes  0.3 - 0.8 10*9/L 0.9 (H)   Absolute Eosinophils 0.0 - 0.5 10*9/L 0.0   Absolute Basophils  0.0 - 0.1 10*9/L 0.1   Neutrophil Left Shift Not Present  Present !   Sodium 135 - 145 mmol/L 138   Potassium 3.5 - 5.0 mmol/L 4.5   Chloride 98 - 107 mmol/L 103   CO2 21.0 - 32.0 mmol/L 23.9   Bun 8 - 20 mg/dL 65 (H)   Creatinine 4.33 - 1.30 mg/dL 2.95 (H)   BUN/Creatinine Ratio  32   eGFR CKD-EPI (2021) Male >=60 mL/min/1.16m2 31 (L)   Anion Gap 3 - 11 mmol/L 11   Glucose 70 - 179 mg/dL 188 (H)   Calcium 8.5 - 10.1 mg/dL 8.5   Albumin 3.5 - 5.0 g/dL 3.5   Total Protein 6.0 - 8.0 g/dL 6.0   Total Bilirubin 0.2 - 1.0 mg/dL 0.4   SGOT (AST) 15 - 40 U/L 20   ALT 12 - 78 U/L 26   Alkaline Phosphatase 46 - 116 U/L 134 (H)   LDH 81 - 234 U/L 200   (L): Data is abnormally low  (H): Data is abnormally high  !: Data is abnormal

## 2023-12-13 NOTE — Unmapped (Signed)
 [facility] Refill Coordination Note    Specialty Medication(s) to be Shipped:   Hematology/Oncology: Calquence     Other medication(s) to be shipped: No additional medications requested for fill at this time     Christian Abbott, DOB: 1934/09/28  Phone: There are no phone numbers on file.      All above HIPAA information was verified with patient's family member, daughter.     Was a Nurse, learning disability used for this call? No    Completed refill call assessment today to schedule patient's medication shipment from the [facility]  [phone]).  All relevant notes have been reviewed.     Specialty medication(s) and dose(s) confirmed: Regimen is correct and unchanged.   Changes to medications: Christian Abbott reports stopping the following medications: VICTOZA  2-PAK 0.6 mg/0.1 mL (18 mg/3 mL) injection (liraglutide ) glipiZIDE 2.5 MG 24 hr tablet (GLUCOTROL XL)  Changes to insurance: No  New side effects reported not previously addressed with a pharmacist or physician: None reported  Questions for the pharmacist: No    Confirmed patient received a Conservation officer, historic buildings and a Surveyor, mining with first shipment. The patient will receive a drug information handout for each medication shipped and additional FDA Medication Guides as required.       DISEASE/MEDICATION-SPECIFIC INFORMATION        N/A    SPECIALTY MEDICATION ADHERENCE     Medication Adherence    Specialty Medication: CALQUENCE  100 mg tablet (acalabrutinib )  Patient is on additional specialty medications: No  Patient is on more than two specialty medications: No              Were doses missed due to medication being on hold? No    CALQUENCE  100 mg tablet (acalabrutinib )  : 7 days of medicine on hand     REFERRAL TO PHARMACIST     Referral to the pharmacist: Not needed      [facility]     Shipping address confirmed in Epic.     Cost and Payment: Patient has a $0 copay, payment information is not required.    Delivery Scheduled: Yes, Expected medication delivery date: 12/16/23.     Medication will be delivered via Same Day Courier to the prescription address in Epic WAM.    Christian Abbott   [facility]  Specialty Technician

## 2023-12-16 MED FILL — CALQUENCE (ACALABRUTINIB MALEATE) 100 MG TABLET: ORAL | 30 days supply | Qty: 60 | Fill #3

## 2023-12-22 DIAGNOSIS — C9110 Chronic lymphocytic leukemia of B-cell type not having achieved remission: Principal | ICD-10-CM

## 2023-12-22 DIAGNOSIS — D801 Nonfamilial hypogammaglobulinemia: Principal | ICD-10-CM

## 2023-12-22 DIAGNOSIS — R918 Other nonspecific abnormal finding of lung field: Principal | ICD-10-CM

## 2023-12-26 ENCOUNTER — Ambulatory Visit: Admit: 2023-12-26 | Discharge: 2023-12-27 | Payer: Medicare (Managed Care)

## 2023-12-26 LAB — CBC W/ AUTO DIFF
BASOPHILS ABSOLUTE COUNT: 0 10*9/L (ref 0.0–0.1)
BASOPHILS RELATIVE PERCENT: 0.8 %
EOSINOPHILS ABSOLUTE COUNT: 0.1 10*9/L (ref 0.0–0.5)
EOSINOPHILS RELATIVE PERCENT: 2.1 %
HEMATOCRIT: 35.2 % — ABNORMAL LOW (ref 39.0–48.0)
HEMOGLOBIN: 11.4 g/dL — ABNORMAL LOW (ref 12.9–16.5)
LYMPHOCYTES ABSOLUTE COUNT: 2.3 10*9/L (ref 1.1–3.6)
LYMPHOCYTES RELATIVE PERCENT: 39.6 %
MEAN CORPUSCULAR HEMOGLOBIN CONC: 32.5 g/dL (ref 32.0–36.0)
MEAN CORPUSCULAR HEMOGLOBIN: 27.1 pg (ref 25.9–32.4)
MEAN CORPUSCULAR VOLUME: 83.3 fL (ref 77.6–95.7)
MEAN PLATELET VOLUME: 8.3 fL (ref 6.8–10.7)
MONOCYTES ABSOLUTE COUNT: 0.6 10*9/L (ref 0.3–0.8)
MONOCYTES RELATIVE PERCENT: 10.9 %
NEUTROPHILS ABSOLUTE COUNT: 2.7 10*9/L (ref 1.8–7.8)
NEUTROPHILS RELATIVE PERCENT: 46.6 %
PLATELET COUNT: 92 10*9/L — ABNORMAL LOW (ref 150–450)
RED BLOOD CELL COUNT: 4.23 10*12/L — ABNORMAL LOW (ref 4.26–5.60)
RED CELL DISTRIBUTION WIDTH: 15.6 % — ABNORMAL HIGH (ref 12.2–15.2)
WBC ADJUSTED: 5.7 10*9/L (ref 3.6–11.2)

## 2023-12-26 LAB — COMPREHENSIVE METABOLIC PANEL
ALBUMIN: 3.3 g/dL — ABNORMAL LOW (ref 3.5–5.0)
ALKALINE PHOSPHATASE: 148 U/L — ABNORMAL HIGH (ref 46–116)
ALT (SGPT): 15 U/L (ref 12–78)
ANION GAP: 10 mmol/L (ref 3–11)
AST (SGOT): 16 U/L (ref 15–40)
BILIRUBIN TOTAL: 0.4 mg/dL (ref 0.2–1.0)
BLOOD UREA NITROGEN: 45 mg/dL — ABNORMAL HIGH (ref 8–20)
BUN / CREAT RATIO: 25
CALCIUM: 8.9 mg/dL (ref 8.5–10.1)
CHLORIDE: 100 mmol/L (ref 98–107)
CO2: 25.6 mmol/L (ref 21.0–32.0)
CREATININE: 1.77 mg/dL — ABNORMAL HIGH (ref 0.80–1.30)
EGFR CKD-EPI (2021) MALE: 37 mL/min/1.73m2 — ABNORMAL LOW (ref >=60)
GLUCOSE RANDOM: 250 mg/dL — ABNORMAL HIGH (ref 70–179)
POTASSIUM: 4.5 mmol/L (ref 3.5–5.0)
PROTEIN TOTAL: 5.9 g/dL — ABNORMAL LOW (ref 6.0–8.0)
SODIUM: 136 mmol/L (ref 135–145)

## 2023-12-26 LAB — LACTATE DEHYDROGENASE: LACTATE DEHYDROGENASE: 187 U/L (ref 81–234)

## 2023-12-26 LAB — IGG: GAMMAGLOBULIN; IGG: 255 mg/dL — ABNORMAL LOW (ref 646–2013)

## 2023-12-26 LAB — IRON & TIBC
IRON SATURATION: 11 % — ABNORMAL LOW (ref 20–55)
IRON: 30 ug/dL — ABNORMAL LOW (ref 50–150)
TOTAL IRON BINDING CAPACITY: 263 ug/dL (ref 250–450)

## 2023-12-26 LAB — FERRITIN: FERRITIN: 98 ng/mL (ref 10.5–307.3)

## 2023-12-26 NOTE — Unmapped (Signed)
 [facility] Established Patient Note     Patient Name: Christian Abbott   Date of Birth: 01/31/1935   Date of Encounter: 12/26/2023     PCP:  Karolynn Pack, MD      Assessment and Plan    # Chronic Lymphocytic Leukemia  --Rai stage IV CLL  --CLL FISH panel shows 13 q. deletion 21.5%, IGHV mutated -good prognostic features  --11/05/2020: TP53 mutation negative.  NOTCH1 negative.  SF3B1 positive.  SF3B1 positivity indicates poor prognosis.  -- no evidence of hemolysis.   --Indication for treatment:  advanced stage and history of thrombocytopenia  -- He has been treated with Rituxan many years ago by Dr Zelda Hickman, with good response in the 1990s. Treatment indication at that time may have been frequent infections.  -- Started Acalabrutinib 12/19/20 - held 1 week prior to surgery on 09/10/22 and restarted on 10/09/2022.  -- 10/26/2023 reviewed CBC, CMP.  Hb 12.1 ALC 1.3 platelets stable to improved at 97.  Thrombocytopenia likely secondary to acalabrutinib     PLAN:  -- Continue Acalabrutinib 100 mg twice daily   --Proceed with IVIG today  -- RTC next in 4 weeks        # Recent COVID-pneumonia  -- Status post 5 days of Paxlovid and azithromycin ordered by PCP.  -- Significant improvement in shortness of breath  and fatigue      #Stage II Colon Adenocarcinoma  - Mucinous Features [pT4N0M0]  - Colonoscopy for evaluation of iron deficiency anemia revealed a transverse colon lesion consistent with adenocarcinoma --MMR deficient, MLH1 and PMS2 absent expression.  BRAF negative  MLH1 hypermethylation + c/with sporadic MMR-deficient colon adenoCA  -- Staging CT chest without contrast and CT abdomen and pelvis with contrast scheduled for today afternoon  -- 09/10/22 Partial colectomy   -- reviewed pathology in detail and discussed [facility] colon cancer nomogram which shows a >90 % 3 year and 5 year PFS   -- I explained the inherent resistance to 33fu with MMR-deficient colon cancers and I would not recommend adjuvant FOLFOX for him given his age ,and comorbidities. His PS is recovering well after surgery however he is still quite frail. PS 2.   -- Reviewed ctDNA results 10/01/22 low positive 0.02-->10/19/22  negative (0)  -- Although initial test was positive - this test was very low positive and ~ 3w later on 2/20 repeat testing was negative. This suggests low risk of distant metastasis.   -- Given age, comorbidites would recommend against Adjuvant FOLFOX.   --MRI Abdomen 02/18/2023 shows no evidence of metastatic disease in the abdomen   -- CT Chest Wo Contrast 02/18/2023 shows no evidence for new or progressive intrathoracic malignancy. Improved bibasal aeration with decreased juxtapleural reticular densities. Clustered reticulonodular densities within the lateral right middle and lower lobes, presumably infectious/inflammatory.   -- Reviewed recent CT chest and MRI abdomen/pelvis 07/2023 showed NED. Some changed noted in the LLL which could be inflammation/infection. Patient has remained asymptomatic without fever, cough, productive sputum.  -- Recent CT DNA testing 03/2023 negative. ctDNA drawn  07/23/2023 - negative.   -- Repeat colonoscopy 08/2023 - clear.       PLAN:  We will continue ctDNA every 3 months until year 2 ~ 08/2024.   then transition to every 6 months for  year 3-5.    CT DNA in February 2025 was negative   Next test by mobile phlebotomy coodinated by [employer] will be scheduled in May 2025- results can be seen in  genetics tab when available.   Continue surveillance visits at least every 3 months for first 2 years until 08/2024 - however patient is being seen every 4-6 weeks for CLL.   Repeat  6 month interval CT Chest wo contrast and MRI abdomen and Pelvis  with/without  ~ scheduled 02/27/2024.   Repeat every 6 months until ~08/2024 then annually for year 3-5.     # cold intolerance:  -- TFTs recently normal.   -- CTM    #Midline incisional hernia  -- Asymptomatic.  Fully reducible.  Not incarcerated.  -- CTM.     # History of iron deficiency anemia   -- He was noted to have iron deficiency in 2014 and has since received IV iron in the past and has been on oral iron 1 pill/day.   -- iron deficiency anemia/ACD in 07/2019 with hb down to 8.7 and ferritin 78, iron sat 12%  -- s/p 1020 mg ferheme 07/2019 and early 10/2018 with hb improvement to 10.9 tday.   --EGD and colonoscopy with Dr. Tova Fresh 01/2020 revealed bleeding gastric ulcer, smaller antral ulcer with old blood. .  Currently being managed with sucralfate.  -- In June 2023 hemoglobin was down to 8.3.  He was given additional IV iron with Feraheme 510 mg x 2 doses  -- S/p feraheme 510 mg x 2 doses 9/28-10/9  --S/p IV iron 08/17/2022 and 08/27/2022  -- hb stable slightly lower todat at 11.4    PLAN  -- repeat iron panel ferritin. Iron 30 . Iron sat 11%, ferritin 98.  Joann Mu has underlying iron deficiency anemia. Will order feraheme 5190 mg x 2 doses 1 week apart.       # Hypogammaglobulinemia   --History level continues to be low however he has not had any more infections since we started him on IVIG. Before we started him on IVIG he has had multiple hospitalizations for pneumonias.  --Therefore we will continue IVIG for now every 3 months as he is tolerated this well.   -- next IVIG dose scheduled May 28th 2026.       # Diastolic CHF  -- Switched to bumex as prescribed by cardiology  -- Diuresis has been associated with some degree of renal dysfunction  -- Continue close follow-up with Cardiology    # CKD Stage IV  -- Following closely with [facility].  -- Cr 1.77 today . At baseline     # Health maintenance  -- Covid booster 05/27/2023  -- Flu vaccine 05/27/2023  -- RSV vaccine ~07/2023  -- Covid booster ~ 10/2023      Disposition:  RTC 4 weeks        Interval History   History of Present Illness  Christian Abbott is an 89 year old male with chronic lymphocytic leukemia and Stage II colon cancer who presents for management of these conditions. He is accompanied by his family members.     He is currently being treated for chronic lymphocytic leukemia (CLL) with acalabrutinib 100 mg twice daily.      He experiences fatigue, which has worsened recently,    He also feels very cold, often wearing a jacket indoors, and has experienced episodes of shivering at night, occurring two to three times, with the first episode about a month ago. Last month TFTs were checked and were nromal.     He has a history of colon cancer, with the last colonoscopy in January 2025 being clear. He is under surveillance with  CT DNA testing every three months, with the last test in February being negative.    His kidney function shows a fluctuating eGFR.       He receives IVIG every three months. Next due 12/2023          Oncology History:  Hematology/Oncology History Overview Note   89 y/o male with long-standing history of chronic lymphocytic leukemia initially diagnosed in 1998  Status post Rituxan based therapy for 8 weeks during that time  History of anemia treated with Procrit off and on  History of hypertension diabetes asthma hypercholesterolemia.  History of hospitalization in January 2014 for bilateral pneumonia [facility] for 3 weeks.     CLL (chronic lymphocytic leukemia)   07/18/1997 Initial Diagnosis    CLL (chronic lymphocytic leukemia)     Colon adenocarcinoma   09/10/2022 -  Cancer Staged    Staging form: Colon and Rectum, AJCC 8th Edition  - Pathologic stage from 09/10/2022: pT4, pN0, cM0 - Signed by Amador Junes, MD on 11/03/2022       11/03/2022 Initial Diagnosis    Colon adenocarcinoma (CMS-HCC)         Past Medical History  Patient Active Problem List   Diagnosis    CLL (chronic lymphocytic leukemia)    Iron deficiency anemia    Hypogammaglobulinemia    Anemia    Need for prophylactic immunotherapy    Colon adenocarcinoma    Thrombocytopenia     Past Surgical History   has a past surgical history that includes Colonoscopy (2021); Esophagogastroduodenoscopy; Inguinal hernia repair; Cervical spine surgery (2012); Coronary angioplasty with stent (2014); Cataract extraction, bilateral; pr upper gi endoscopy,w/dir submuc inj (N/A, 08/10/2022); pr upper gi endoscopy,biopsy (N/A, 08/10/2022); pr lap,surg,colectomy,w/remvl term ileum (N/A, 09/10/2022); and pr colonoscopy flx dx w/collj spec when pfrmd (N/A, 09/20/2023).    Allergies:   has no known allergies.      Medications:   has a current medication list which includes the following prescription(s): acalabrutinib, accu-chek guide test strips, albuterol 2.5 mg /3 mL (0.083 %) Nebu 3 mL, albuterol 5 mg/mL Nebu 0.5 mL, aspirin, atorvastatin, bumetanide, cholecalciferol (vitamin d3-125 mcg (5,000 unit)), denta 5000 plus, doxazosin, empagliflozin, famotidine, fluticasone propionate, folic acid, freestyle libre 3 sensor, glipizide, glucosamine-chondroitin, hydralazine, ipratropium-albuterol, victoza 2-pak, metoprolol succinate, dulera, peg-electrolyte soln, potassium chloride, rybelsus, spironolactone, sucralfate, vit c/e/zn/coppr/lutein/zeaxan, zafirlukast, clopidogrel, and glipizide.    Family History/Social History:  Reviewed and updated as appropriate.    Review of Systems:   As per interval history.  All other systems reviewed and negative.    Physical Exam:   Vitals: BP 130/64  - Pulse 62  - Temp 36.4 ??C (97.5 ??F) (Temporal)  - Resp 16  - Ht 159 cm (5' 2.6)  - Wt 64.9 kg (143 lb)  - SpO2 99%  - BMI 25.66 kg/m??   General:  Pleasant, no acute distress.  ECOG PS 2.   Pain:  Pain Evaluation:                                   Pain Score (0 - 1):                                Pain Location:  Eyes:  Appears normal.   HENT:  Atraumatic.   Neck:  thyroid midline.  Lymphatics: Bilateral small cervical lymph nodes.  No supraclavicular axillary or inguinal lymphadenopathy   Cardiovascular:   RRR, trace edema b/l ankles.   Respiratory:  Unlabored.  Good air entry b/l. No wheezing.    Gastrointestinal: Nondistended.  Nontender. No palpable HSM.  Midline insicional hernia. Reducible. Nontender.   Skin:  No rashes or subcutaneous nodules.    Musculoskeletal:  Gait is steady with cane.   Psychiatric:  Affect appropriate. Judgment and insight nl.  Neurologic:  Alert and oriented x3. Grossly non-focal.            Pertinent Studies/Imaging     MRI Abdomen 02/18/2023  Impression: No evidence of metastatic disease in the abdomen     CT Chest Wo Contrast 02/18/2023   Impression: No evidence for new or progressive intrathoracic malignancy. Improved bibasal aeration with decreased juxtapleural reticular densities. Clustered reticulonodular densities within the lateral right middle and lower lobes, presumably infectious/inflammatory.     Labs:   Results for orders placed or performed in visit on 12/26/23   Comprehensive Metabolic Panel   Result Value Ref Range    Sodium 136 135 - 145 mmol/L    Potassium 4.5 3.5 - 5.0 mmol/L    Chloride 100 98 - 107 mmol/L    CO2 25.6 21.0 - 32.0 mmol/L    Anion Gap 10 3 - 11 mmol/L    BUN 45 (H) 8 - 20 mg/dL    Creatinine 1.61 (H) 0.80 - 1.30 mg/dL    BUN/Creatinine Ratio 25     eGFR CKD-EPI (2021) Male 37 (L) >=60 mL/min/1.28m2    Glucose 250 (H) 70 - 179 mg/dL    Calcium 8.9 8.5 - 09.6 mg/dL    Albumin 3.3 (L) 3.5 - 5.0 g/dL    Total Protein 5.9 (L) 6.0 - 8.0 g/dL    Total Bilirubin 0.4 0.2 - 1.0 mg/dL    AST 16 15 - 40 U/L    ALT 15 12 - 78 U/L    Alkaline Phosphatase 148 (H) 46 - 116 U/L   Lactate dehydrogenase   Result Value Ref Range    LDH 187 81 - 234 U/L   CBC w/ Differential   Result Value Ref Range    Results Verified by Slide Scan Slide Reviewed     WBC 5.7 3.6 - 11.2 10*9/L    RBC 4.23 (L) 4.26 - 5.60 10*12/L    HGB 11.4 (L) 12.9 - 16.5 g/dL    HCT 04.5 (L) 40.9 - 48.0 %    MCV 83.3 77.6 - 95.7 fL    MCH 27.1 25.9 - 32.4 pg    MCHC 32.5 32.0 - 36.0 g/dL    RDW 81.1 (H) 91.4 - 15.2 %    MPV 8.3 6.8 - 10.7 fL    Platelet 92 (L) 150 - 450 10*9/L    Neutrophils % 46.6 %    Lymphocytes % 39.6 %    Monocytes % 10.9 %    Eosinophils % 2.1 %    Basophils % 0.8 %    Absolute Neutrophils 2.7 1.8 - 7.8 10*9/L    Absolute Lymphocytes 2.3 1.1 - 3.6 10*9/L    Absolute Monocytes 0.6 0.3 - 0.8 10*9/L    Absolute Eosinophils 0.1 0.0 - 0.5 10*9/L    Absolute Basophils 0.0 0.0 - 0.1 10*9/L     *Note: Due to a large number of results  and/or encounters for the requested time period, some results have not been displayed. A complete set of results can be found in Results Review.     I have independently reviewed notes from prior visits, results from labs and imaging.  The current plan of care requires intensive monitoring for toxicity due to use of antineoplastic treatments.  Medical decision making was of high complexity due to to ongoing risk of morbidity/mortality from antineoplastic treatment.      The patient reports that all of his questions were answered to his satisfaction today. he was encouraged to contact us  for any questions that may arise prior to his next visit.Thank you for the opportunity to participate in Sohrab Carreras's care.       Amador Junes, MD

## 2023-12-28 DIAGNOSIS — D509 Iron deficiency anemia, unspecified: Principal | ICD-10-CM

## 2023-12-28 NOTE — Unmapped (Signed)
 Addended by: Amador Junes on: 12/28/2023 06:15 PM     Modules accepted: Orders

## 2023-12-29 DIAGNOSIS — D801 Nonfamilial hypogammaglobulinemia: Principal | ICD-10-CM

## 2024-01-13 NOTE — Unmapped (Signed)
 [facility] Refill Coordination Note    Specialty Medication(s) to be Shipped:   Hematology/Oncology: Cal[place]ence    Other medication(s) to be shipped: No additional medications requested for fill at this time     Kinan Safley, DOB: 1935-08-11  Phone: There are no phone numbers on file.      All above HIPAA information was verified with patient's family member, daughter .     Was a Nurse, learning disability used for this call? No    Completed refill call assessment today to schedule patient's medication shipment from the [facility]  [phone]).  All relevant notes have been reviewed.     Specialty medication(s) and dose(s) confirmed: Regimen is correct and unchanged.   Changes to medications: Tarell reports stopping the following medications: victoza and starting rybelsus  Changes to insurance: No  New side effects reported not previously addressed with a pharmacist or physician: None reported  Questions for the pharmacist: No    Confirmed patient received a Conservation officer, historic buildings and a Surveyor, mining with first shipment. The patient will receive a drug information handout for each medication shipped and additional FDA Medication Guides as required.       DISEASE/MEDICATION-SPECIFIC INFORMATION        N/A    SPECIALTY MEDICATION ADHERENCE     Medication Adherence    Patient reported X missed doses in the last month: 0  Specialty Medication: acalabrutinib: CALQUENCE 100 mg tablet  Patient is on additional specialty medications: No  Patient is on more than two specialty medications: No              Were doses missed due to medication being on hold? No    acalabrutinib: CALQUENCE 100 mg tablet  : 7 days of medicine on hand       REFERRAL TO PHARMACIST     Referral to the pharmacist: Not needed      [facility]     Shipping address confirmed in Epic.     Cost and Payment: Patient has a $0 copay, payment information is not required.    Delivery Scheduled: Yes, Expected medication delivery date: 01/20/24.     Medication will be delivered via Same Day Courier to the prescription address in Epic WAM.    Almeta Arm   [facility]  Specialty Technician

## 2024-01-20 MED FILL — CALQUENCE (ACALABRUTINIB MALEATE) 100 MG TABLET: ORAL | 30 days supply | Qty: 60 | Fill #4

## 2024-01-24 NOTE — Unmapped (Signed)
 [facility] Established Patient Note     Patient Name: Christian Abbott   Date of Birth: 03/30/1935   Date of Encounter: 01/25/2024     PCP:  Karolynn Pack, MD      Assessment and Plan    # Chronic Lymphocytic Leukemia  --Rai stage IV CLL  --CLL FISH panel shows 13 q. deletion 21.5%, IGHV mutated -good prognostic features  --11/05/2020: TP53 mutation negative.  NOTCH1 negative.  SF3B1 positive.  SF3B1 positivity indicates poor prognosis.  -- no evidence of hemolysis.   --Indication for treatment:  advanced stage and history of thrombocytopenia  -- He has been treated with Rituxan many years ago by Dr Zelda Hickman, with good response in the 1990s. Treatment indication at that time may have been frequent infections.  -- Started Acalabrutinib 12/19/20 - held 1 week prior to surgery on 09/10/22 and restarted on 10/09/2022.    Tolerating Acalabrutinib well.  CBC very stable. Platelets 101 today     PLAN:  -- Continue Acalabrutinib 100 mg twice daily   -- Proceed with IVIG today  -- RTC next in 4 weeks    # Recent COVID-pneumonia  -- Status post 5 days of Paxlovid and azithromycin ordered by PCP.  -- Significant improvement in shortness of breath and fatigue    #Stage II Colon Adenocarcinoma  - Mucinous Features [pT4N0M0]  - Colonoscopy for evaluation of iron deficiency anemia revealed a transverse colon lesion consistent with adenocarcinoma --MMR deficient, MLH1 and PMS2 absent expression.  BRAF negative  MLH1 hypermethylation + c/with sporadic MMR-deficient colon adenoCA  -- Staging CT chest without contrast and CT abdomen and pelvis with contrast scheduled for today afternoon  -- 09/10/22 Partial colectomy   -- reviewed pathology in detail and discussed [facility] colon cancer nomogram which shows a >90 % 3 year and 5 year PFS   -- I explained the inherent resistance to 1fu with MMR-deficient colon cancers and I would not recommend adjuvant FOLFOX for him given his age ,and comorbidities. His PS is recovering well after surgery however he is still quite frail. PS 2.   -- Reviewed ctDNA results 10/01/22 low positive 0.02-->10/19/22  negative (0)  -- Although initial test was positive - this test was very low positive and ~ 3w later on 2/20 repeat testing was negative. This suggests low risk of distant metastasis.   -- Given age, comorbidites would recommend against Adjuvant FOLFOX.   --MRI Abdomen 02/18/2023 shows no evidence of metastatic disease in the abdomen   -- CT Chest Wo Contrast 02/18/2023 shows no evidence for new or progressive intrathoracic malignancy. Improved bibasal aeration with decreased juxtapleural reticular densities. Clustered reticulonodular densities within the lateral right middle and lower lobes, presumably infectious/inflammatory.   -- Reviewed recent CT chest and MRI abdomen/pelvis 07/2023 showed NED. Some changed noted in the LLL which could be inflammation/infection. Patient has remained asymptomatic without fever, cough, productive sputum.  -- Recent CT DNA testing 03/2023 negative. ctDNA drawn  07/23/2023 - negative.   -- Repeat colonoscopy 08/2023 - clear.   -- ctDNA May 2025 - negative, reviewed with pt and provided copy     PLAN:  We will continue ctDNA every 3 months until year 2 ~ 08/2024.   then transition to every 6 months for  year 3-5.    CT DNA in May 2025 was negative   Next test by mobile phlebotomy coodinated by [employer] will be scheduled in August 2025- results can be seen in genetics tab when available.  Continue surveillance visits at least every 3 months for first 2 years until 08/2024 - however patient is being seen every 4-6 weeks for CLL.   Repeat  6 month interval CT Chest wo contrast and MRI abdomen and Pelvis with/without  ~ scheduled 02/27/2024.   Repeat every 6 months until ~08/2024 then annually for year 3-5.     # cold intolerance:  -- TFTs recently normal.   -- CTM    #Midline incisional hernia  -- Asymptomatic.  Fully reducible.  Not incarcerated.  -- CTM.     # History of iron deficiency anemia   -- He was noted to have iron deficiency in 2014 and has since received IV iron in the past and has been on oral iron 1 pill/day.   -- iron deficiency anemia/ACD in 07/2019 with hb down to 8.7 and ferritin 78, iron sat 12%  -- s/p 1020 mg ferheme 07/2019 and early 10/2018 with hb improvement to 10.9 tday.   --EGD and colonoscopy with Dr. Tova Fresh 01/2020 revealed bleeding gastric ulcer, smaller antral ulcer with old blood. .  Currently being managed with sucralfate.  -- In June 2023 hemoglobin was down to 8.3.  He was given additional IV iron with Feraheme 510 mg x 2 doses  -- S/p feraheme 510 mg x 2 doses 9/28-10/9  --S/p IV iron 08/17/2022 and 08/27/2022  -- hb stable slightly lower todat at 11.4  -- repeat iron panel ferritin. Iron 30 . Iron sat 11%, ferritin 98.  Joann Mu has underlying iron deficiency anemia. Will order feraheme 5190 mg x 2 doses 1 week apart.     Plan:   Here for IV feraheme today per Dr Drexel Gentles. Reviewed labs and indications for iron.       # Hypogammaglobulinemia   --History level continues to be low however he has not had any more infections since we started him on IVIG. Before we started him on IVIG he has had multiple hospitalizations for pneumonias.  --Therefore we will continue IVIG for now every 3 months as he is tolerated this well.   -- Receiving IVIG today   -- next IVIG dose will be due ~ Aug 2025      # Diastolic CHF  -- Switched to bumex as prescribed by cardiology  -- Diuresis has been associated with some degree of renal dysfunction  -- Continue close follow-up with Cardiology    # CKD Stage IV  -- Following closely with [facility].  -- Cr 2.09 today. Reviewed with pt and dtr. Will send labs to nephrology     # Health maintenance  -- Covid booster 05/27/2023  -- Flu vaccine 05/27/2023  -- RSV vaccine ~07/2023  -- Covid booster ~ 10/2023    Disposition:  RTC 4 weeks    Collins Dean, MSN, AGNP-BC  [facility], [facility] Patient was seen independently with physician in clinic.  Prentice Brochure, MD was present in the clinic and available for collaboration regarding patient case.      Interval History     01/25/24: Mr. Groleau is seen chairside in infusion. He presents per IVIG and IV feraheme. Dtr is present. He is feeling well. No new concerns. Intolerance to activity is gradually improving, but still endorses fatigue. No cough. No CP. No recent fevers, night sweats. Appetite is good. He drinks water throughout the day (approximately 60 oz).     History of Present Illness  Mychael Jilek is an 89 year old male with chronic lymphocytic  leukemia and Stage II colon cancer who presents for management of these conditions. He is accompanied by his family members.     He is currently being treated for chronic lymphocytic leukemia (CLL) with acalabrutinib 100 mg twice daily.      He experiences fatigue, which has worsened recently,    He also feels very cold, often wearing a jacket indoors, and has experienced episodes of shivering at night, occurring two to three times, with the first episode about a month ago. Last month TFTs were checked and were nromal.     He has a history of colon cancer, with the last colonoscopy in January 2025 being clear. He is under surveillance with CT DNA testing every three months, with the last test in February being negative.    His kidney function shows a fluctuating eGFR.       He receives IVIG every three months. Next due 12/2023          Oncology History:  Hematology/Oncology History Overview Note   89 y/o male with long-standing history of chronic lymphocytic leukemia initially diagnosed in 1998  Status post Rituxan based therapy for 8 weeks during that time  History of anemia treated with Procrit off and on  History of hypertension diabetes asthma hypercholesterolemia.  History of hospitalization in January 2014 for bilateral pneumonia [facility] for 3 weeks.     CLL (chronic lymphocytic leukemia)     07/18/1997 Initial Diagnosis    CLL (chronic lymphocytic leukemia)     Colon adenocarcinoma     09/10/2022 -  Cancer Staged    Staging form: Colon and Rectum, AJCC 8th Edition  - Pathologic stage from 09/10/2022: pT4, pN0, cM0 - Signed by Amador Junes, MD on 11/03/2022       11/03/2022 Initial Diagnosis    Colon adenocarcinoma (CMS-HCC)         Past Medical History  Patient Active Problem List   Diagnosis    CLL (chronic lymphocytic leukemia)      Iron deficiency anemia    Hypogammaglobulinemia (HHS-HCC)    Anemia    Need for prophylactic immunotherapy    Colon adenocarcinoma      Thrombocytopenia     Past Surgical History   has a past surgical history that includes Colonoscopy (2021); Esophagogastroduodenoscopy; Inguinal hernia repair; Cervical spine surgery (2012); Coronary angioplasty with stent (2014); Cataract extraction, bilateral; pr upper gi endoscopy,w/dir submuc inj (N/A, 08/10/2022); pr upper gi endoscopy,biopsy (N/A, 08/10/2022); pr lap,surg,colectomy,w/remvl term ileum (N/A, 09/10/2022); and pr colonoscopy flx dx w/collj spec when pfrmd (N/A, 09/20/2023).    Allergies:   has no known allergies.      Medications:   has a current medication list which includes the following prescription(s): acalabrutinib, accu-chek guide test strips, albuterol 2.5 mg /3 mL (0.083 %) Nebu 3 mL, albuterol 5 mg/mL Nebu 0.5 mL, aspirin, atorvastatin, bumetanide, cholecalciferol (vitamin d3-125 mcg (5,000 unit)), clopidogrel, denta 5000 plus, doxazosin, empagliflozin, famotidine, fluticasone propionate, folic acid, freestyle libre 3 sensor, glipizide, glipizide, glucosamine-chondroitin, hydralazine, ipratropium-albuterol, victoza 2-pak, metoprolol succinate, dulera, peg-electrolyte soln, potassium chloride, rybelsus, spironolactone, sucralfate, vit c/e/zn/coppr/lutein/zeaxan, and zafirlukast, and the following Facility-Administered Medications: ferumoxytol (FERAHEME) 510 mg in sodium chloride 0.9 % (NS) 100 mL IVPB-MBP, immun glob g(igg)-pro-iga 0-50 **FOLLOWED BY** immun glob g(igg)-pro-iga 0-50, [EXPIRED] immun glob g(igg)-pro-iga 0-50 **FOLLOWED BY** immun glob g(igg)-pro-iga 0-50.    Family History/Social History:  Reviewed and updated as appropriate.    Review of Systems:   As per interval history.  All  other systems reviewed and negative.    Physical Exam:   Vitals: There were no vitals taken for this visit.  Per infusion flow sheet, WNL   General:  Pleasant, no acute distress.  ECOG PS 2.   Pain:  Pain Evaluation:                                   Pain Score (0 - 1):   0                             Pain Location:                                       General: NAD, pleasant  HENT:  Atraumatic.   Neck:  thyroid midline.  Lymphatics: Bilateral small cervical lymph nodes.  No supraclavicular axillary or inguinal lymphadenopathy   Cardiovascular:   RRR, No edema   Respiratory:  Unlabored.  Good air entry b/l. No wheezing. Mildly decreased bases   Gastrointestinal: Nondistended.  Nontender. No palpable HSM.  Midline insicional hernia. Reducible. Nontender.   Skin:  No rashes or subcutaneous nodules.    Musculoskeletal:  Gait is steady with cane. Moves all extremities   Psychiatric:  Affect appropriate. Judgment and insight nl.  Neurologic:  Alert and oriented x3. Grossly non-focal.            Pertinent Studies/Imaging     MRI Abdomen 02/18/2023  Impression: No evidence of metastatic disease in the abdomen     CT Chest Wo Contrast 02/18/2023   Impression: No evidence for new or progressive intrathoracic malignancy. Improved bibasal aeration with decreased juxtapleural reticular densities. Clustered reticulonodular densities within the lateral right middle and lower lobes, presumably infectious/inflammatory.     Labs:   Results for orders placed or performed in visit on 01/25/24   Comprehensive Metabolic Panel   Result Value Ref Range    Sodium 136 135 - 145 mmol/L    Potassium 4.8 3.5 - 5.0 mmol/L    Chloride 104 98 - 107 mmol/L    CO2 25.1 21.0 - 32.0 mmol/L    Anion Gap 7 3 - 11 mmol/L    BUN 70 (H) 8 - 20 mg/dL    Creatinine 6.64 (H) 0.80 - 1.30 mg/dL    BUN/Creatinine Ratio 33     eGFR CKD-EPI (2021) Male 30 (L) >=60 mL/min/1.45m2    Glucose 202 (H) 70 - 179 mg/dL    Calcium 9.1 8.5 - 40.3 mg/dL    Albumin 3.5 3.5 - 5.0 g/dL    Total Protein 6.1 6.0 - 8.0 g/dL    Total Bilirubin 0.4 0.2 - 1.0 mg/dL    AST 17 15 - 40 U/L    ALT 23 12 - 78 U/L    Alkaline Phosphatase 152 (H) 46 - 116 U/L   Iron Level and TIBC   Result Value Ref Range    Iron 46 (L) 50 - 150 ug/dL    TIBC 474 259 - 563 ug/dL    Iron Saturation (%) 16 (L) 20 - 55 %   Ferritin   Result Value Ref Range    Ferritin 88.0 10.5 - 307.3 ng/mL   Lactate dehydrogenase   Result Value Ref Range    LDH 203 81 - 234  U/L   CBC w/ Differential   Result Value Ref Range    Results Verified by Slide Scan Slide Reviewed     WBC 5.2 3.6 - 11.2 10*9/L    RBC 4.35 4.26 - 5.60 10*12/L    HGB 11.8 (L) 12.9 - 16.5 g/dL    HCT 03.4 (L) 74.2 - 48.0 %    MCV 82.3 77.6 - 95.7 fL    MCH 27.2 25.9 - 32.4 pg    MCHC 33.1 32.0 - 36.0 g/dL    RDW 59.5 (H) 63.8 - 15.2 %    MPV 8.1 6.8 - 10.7 fL    Platelet 101 (L) 150 - 450 10*9/L    Neutrophils % 55.8 %    Lymphocytes % 29.3 %    Monocytes % 12.0 %    Eosinophils % 1.8 %    Basophils % 1.1 %    Absolute Neutrophils 2.9 1.8 - 7.8 10*9/L    Absolute Lymphocytes 1.5 1.1 - 3.6 10*9/L    Absolute Monocytes 0.6 0.3 - 0.8 10*9/L    Absolute Eosinophils 0.1 0.0 - 0.5 10*9/L    Absolute Basophils 0.1 0.0 - 0.1 10*9/L   Morphology Review   Result Value Ref Range    Hypersegmented Neutrophils Present (A) Not Present     *Note: Due to a large number of results and/or encounters for the requested time period, some results have not been displayed. A complete set of results can be found in Results Review.     I have independently reviewed notes from prior visits, results from labs and imaging.  The current plan of care requires intensive monitoring for toxicity due to use of antineoplastic treatments.  Medical decision making was of high complexity due to to ongoing risk of morbidity/mortality from antineoplastic treatment.    The patient reports that all of his questions were answered to his satisfaction today. he was encouraged to contact us  for any questions that may arise prior to his next visit.Thank you for the opportunity to participate in Zorawar Peake's care.

## 2024-01-25 ENCOUNTER — Ambulatory Visit: Admit: 2024-01-25 | Discharge: 2024-01-25 | Payer: Medicare (Managed Care)

## 2024-01-25 ENCOUNTER — Encounter: Admit: 2024-01-25 | Discharge: 2024-01-25 | Payer: Medicare (Managed Care)

## 2024-01-25 DIAGNOSIS — D508 Other iron deficiency anemias: Principal | ICD-10-CM

## 2024-01-25 DIAGNOSIS — C9110 Chronic lymphocytic leukemia of B-cell type not having achieved remission: Principal | ICD-10-CM

## 2024-01-25 DIAGNOSIS — C189 Malignant neoplasm of colon, unspecified: Principal | ICD-10-CM

## 2024-01-25 DIAGNOSIS — D801 Nonfamilial hypogammaglobulinemia: Principal | ICD-10-CM

## 2024-01-25 DIAGNOSIS — D509 Iron deficiency anemia, unspecified: Principal | ICD-10-CM

## 2024-01-25 DIAGNOSIS — D696 Thrombocytopenia, unspecified: Principal | ICD-10-CM

## 2024-01-25 LAB — COMPREHENSIVE METABOLIC PANEL
ALBUMIN: 3.5 g/dL (ref 3.5–5.0)
ALKALINE PHOSPHATASE: 152 U/L — ABNORMAL HIGH (ref 46–116)
ALT (SGPT): 23 U/L (ref 12–78)
ANION GAP: 7 mmol/L (ref 3–11)
AST (SGOT): 17 U/L (ref 15–40)
BILIRUBIN TOTAL: 0.4 mg/dL (ref 0.2–1.0)
BLOOD UREA NITROGEN: 70 mg/dL — ABNORMAL HIGH (ref 8–20)
BUN / CREAT RATIO: 33
CALCIUM: 9.1 mg/dL (ref 8.5–10.1)
CHLORIDE: 104 mmol/L (ref 98–107)
CO2: 25.1 mmol/L (ref 21.0–32.0)
CREATININE: 2.09 mg/dL — ABNORMAL HIGH (ref 0.80–1.30)
EGFR CKD-EPI (2021) MALE: 30 mL/min/1.73m2 — ABNORMAL LOW (ref >=60)
GLUCOSE RANDOM: 202 mg/dL — ABNORMAL HIGH (ref 70–179)
POTASSIUM: 4.8 mmol/L (ref 3.5–5.0)
PROTEIN TOTAL: 6.1 g/dL (ref 6.0–8.0)
SODIUM: 136 mmol/L (ref 135–145)

## 2024-01-25 LAB — FERRITIN: FERRITIN: 88 ng/mL (ref 10.5–307.3)

## 2024-01-25 LAB — CBC W/ AUTO DIFF
BASOPHILS ABSOLUTE COUNT: 0.1 10*9/L (ref 0.0–0.1)
BASOPHILS RELATIVE PERCENT: 1.1 %
EOSINOPHILS ABSOLUTE COUNT: 0.1 10*9/L (ref 0.0–0.5)
EOSINOPHILS RELATIVE PERCENT: 1.8 %
HEMATOCRIT: 35.8 % — ABNORMAL LOW (ref 39.0–48.0)
HEMOGLOBIN: 11.8 g/dL — ABNORMAL LOW (ref 12.9–16.5)
LYMPHOCYTES ABSOLUTE COUNT: 1.5 10*9/L (ref 1.1–3.6)
LYMPHOCYTES RELATIVE PERCENT: 29.3 %
MEAN CORPUSCULAR HEMOGLOBIN CONC: 33.1 g/dL (ref 32.0–36.0)
MEAN CORPUSCULAR HEMOGLOBIN: 27.2 pg (ref 25.9–32.4)
MEAN CORPUSCULAR VOLUME: 82.3 fL (ref 77.6–95.7)
MEAN PLATELET VOLUME: 8.1 fL (ref 6.8–10.7)
MONOCYTES ABSOLUTE COUNT: 0.6 10*9/L (ref 0.3–0.8)
MONOCYTES RELATIVE PERCENT: 12 %
NEUTROPHILS ABSOLUTE COUNT: 2.9 10*9/L (ref 1.8–7.8)
NEUTROPHILS RELATIVE PERCENT: 55.8 %
PLATELET COUNT: 101 10*9/L — ABNORMAL LOW (ref 150–450)
RED BLOOD CELL COUNT: 4.35 10*12/L (ref 4.26–5.60)
RED CELL DISTRIBUTION WIDTH: 15.5 % — ABNORMAL HIGH (ref 12.2–15.2)
WBC ADJUSTED: 5.2 10*9/L (ref 3.6–11.2)

## 2024-01-25 LAB — SLIDE REVIEW

## 2024-01-25 LAB — LACTATE DEHYDROGENASE: LACTATE DEHYDROGENASE: 203 U/L (ref 81–234)

## 2024-01-25 LAB — IRON & TIBC
IRON SATURATION: 16 % — ABNORMAL LOW (ref 20–55)
IRON: 46 ug/dL — ABNORMAL LOW (ref 50–150)
TOTAL IRON BINDING CAPACITY: 289 ug/dL (ref 250–450)

## 2024-01-25 MED ADMIN — immun glob G(IgG)-pro-IgA 0-50 (PRIVIGEN) 10 % intravenous solution 5 g: 5 g | INTRAVENOUS | @ 15:00:00 | Stop: 2024-01-25

## 2024-01-25 MED ADMIN — diphenhydrAMINE (BENADRYL) capsule/tablet 25 mg: 25 mg | ORAL | @ 12:00:00 | Stop: 2024-01-25

## 2024-01-25 MED ADMIN — acetaminophen (TYLENOL) tablet 650 mg: 650 mg | ORAL | @ 12:00:00 | Stop: 2024-01-25

## 2024-01-25 MED ADMIN — ferumoxytol (FERAHEME) 510 mg in sodium chloride 0.9 % (NS) 100 mL IVPB-MBP: 510 mg | INTRAVENOUS | @ 15:00:00 | Stop: 2024-01-25

## 2024-01-25 MED ADMIN — immun glob G(IgG)-pro-IgA 0-50 (PRIVIGEN) 10 % intravenous solution 20 g: 20 g | INTRAVENOUS | @ 13:00:00 | Stop: 2024-01-25

## 2024-01-25 NOTE — Unmapped (Signed)
 Latest Reference Range & Units 01/25/24 08:06   Slide Scan  Slide Reviewed   WBC 3.6 - 11.2 10*9/L 5.2   RBC 4.26 - 5.60 10*12/L 4.35   HGB 12.9 - 16.5 g/dL 09.6 (L)   HCT 04.5 - 40.9 % 35.8 (L)   MCV 77.6 - 95.7 fL 82.3   MCH 25.9 - 32.4 pg 27.2   MCHC 32.0 - 36.0 g/dL 81.1   RDW 91.4 - 78.2 % 15.5 (H)   MPV 6.8 - 10.7 fL 8.1   Platelet 150 - 450 10*9/L 101 (L)   Neutrophils % % 55.8   Lymphocytes % % 29.3   Monocytes % % 12.0   Eosinophils % % 1.8   Basophils % % 1.1   Absolute Neutrophils 1.8 - 7.8 10*9/L 2.9   Absolute Lymphocytes 1.1 - 3.6 10*9/L 1.5   Absolute Monocytes  0.3 - 0.8 10*9/L 0.6   Absolute Eosinophils 0.0 - 0.5 10*9/L 0.1   Absolute Basophils  0.0 - 0.1 10*9/L 0.1   Hypersegmented Neutrophils Not Present  Present !   Sodium 135 - 145 mmol/L 136   Potassium 3.5 - 5.0 mmol/L 4.8   Chloride 98 - 107 mmol/L 104   CO2 21.0 - 32.0 mmol/L 25.1   Bun 8 - 20 mg/dL 70 (H)   Creatinine 9.56 - 1.30 mg/dL 2.13 (H)   BUN/Creatinine Ratio  33   eGFR CKD-EPI (2021) Male >=60 mL/min/1.45m2 30 (L)   Anion Gap 3 - 11 mmol/L 7   Glucose 70 - 179 mg/dL 086 (H)   Calcium 8.5 - 10.1 mg/dL 9.1   Albumin 3.5 - 5.0 g/dL 3.5   Total Protein 6.0 - 8.0 g/dL 6.1   Total Bilirubin 0.2 - 1.0 mg/dL 0.4   SGOT (AST) 15 - 40 U/L 17   ALT 12 - 78 U/L 23   Alkaline Phosphatase 46 - 116 U/L 152 (H)   Iron 50 - 150 ug/dL 46 (L)   TIBC 578 - 469 ug/dL 629   Iron Saturation (%) 20 - 55 % 16 (L)   Ferritin 10.5 - 307.3 ng/mL 88.0   LDH 81 - 234 U/L 203   (L): Data is abnormally low  (H): Data is abnormally high  !: Data is abnormal

## 2024-01-25 NOTE — Unmapped (Signed)
 Pt arrived for IVIG/feraheme infusion. Premeds given. PIV placed without issue and gave brisk blood return.  Infusion completed, tolerated well, VSS.  Patient provided blankets, drinks, and snacks as needed throughout infusion.  Comfort continually assessed by staff. Daughter at chairside. Pt monitored for 30 minutes post feraheme infusion. PIV de-accessed per [place] protocol. Patient discharged in stable condition, ambulatory.  Return to clinic info reviewed with patient.

## 2024-01-28 MED ORDER — SUCRALFATE 100 MG/ML ORAL SUSPENSION
Freq: Every evening | ORAL | 4 refills | 0.00000 days
Start: 2024-01-28 — End: ?

## 2024-01-30 MED ORDER — SUCRALFATE 100 MG/ML ORAL SUSPENSION
Freq: Every evening | ORAL | 4 refills | 42.00000 days | Status: CP
Start: 2024-01-30 — End: ?

## 2024-01-30 NOTE — Unmapped (Signed)
 VM received from daughter, Lita Rieger, requesting refill of Sucralfate 100 mg/ml.     Refill request from pharmacy already sent to provider by Dickie Found.

## 2024-01-30 NOTE — Unmapped (Signed)
 Refill request for Carafate 100 mg sent to provider to approve as appropriate.       Last Refill: 20Apr2025  Last Visit: 28May2025

## 2024-02-01 ENCOUNTER — Encounter: Admit: 2024-02-01 | Discharge: 2024-02-02 | Payer: Medicare (Managed Care)

## 2024-02-01 DIAGNOSIS — D801 Nonfamilial hypogammaglobulinemia: Principal | ICD-10-CM

## 2024-02-01 DIAGNOSIS — C9110 Chronic lymphocytic leukemia of B-cell type not having achieved remission: Principal | ICD-10-CM

## 2024-02-01 DIAGNOSIS — D509 Iron deficiency anemia, unspecified: Principal | ICD-10-CM

## 2024-02-01 MED ADMIN — diphenhydrAMINE (BENADRYL) capsule/tablet 25 mg: 25 mg | ORAL | @ 12:00:00 | Stop: 2024-02-01

## 2024-02-01 MED ADMIN — ferumoxytol (FERAHEME) 510 mg in sodium chloride 0.9 % (NS) 100 mL IVPB-MBP: 510 mg | INTRAVENOUS | @ 13:00:00 | Stop: 2024-02-01

## 2024-02-01 MED ADMIN — acetaminophen (TYLENOL) tablet 650 mg: 650 mg | ORAL | @ 12:00:00 | Stop: 2024-02-01

## 2024-02-01 NOTE — Unmapped (Signed)
 Patient here for #2 of 2 fereheme infusions, completed, tolerated well, VSS.  Patient provided blankets, drinks, and snacks as needed throughout infusion.  Comfort continually assessed by staff. Family at chairside. PIV de-accessed per [place] protocol. Patient discharged in stable condition, ambulatory, uses cane.  Return to clinic info reviewed with patient.

## 2024-02-16 NOTE — Unmapped (Signed)
 [facility] Refill Coordination Note    Specialty Medication(s) to be Shipped:   Hematology/Oncology: [place]    Other medication(s) to be shipped: No additional medications requested for fill at this time     Christian Abbott, DOB: 31-Mar-1935  Phone: There are no phone numbers on file.      All above HIPAA information was verified with patient's family member, daughter.     Was a Nurse, learning disability used for this call? No    Completed refill call assessment today to schedule patient's medication shipment from the [facility]  [phone]).  All relevant notes have been reviewed.     Specialty medication(s) and dose(s) confirmed: Regimen is correct and unchanged.   Changes to medications: Christian Abbott reports no changes at this time.  Changes to insurance: No  New side effects reported not previously addressed with a pharmacist or physician: None reported  Questions for the pharmacist: No    Confirmed patient received a Conservation officer, historic buildings and a Surveyor, mining with first shipment. The patient will receive a drug information handout for each medication shipped and additional FDA Medication Guides as required.       DISEASE/MEDICATION-SPECIFIC INFORMATION        N/A    SPECIALTY MEDICATION ADHERENCE     Medication Adherence    Patient reported X missed doses in the last month: 0  Specialty Medication: acalabrutinib: [place] 100 mg tablet  Patient is on additional specialty medications: No  Patient is on more than two specialty medications: No              Were doses missed due to medication being on hold? No    acalabrutinib: [place] 100 mg tablet  : 8 days of medicine on hand       REFERRAL TO PHARMACIST     Referral to the pharmacist: Not needed      [facility]     Shipping address confirmed in Epic.     Cost and Payment: Patient has a $0 copay, payment information is not required.    Delivery Scheduled: Yes, Expected medication delivery date: 02/24/24. Medication will be delivered via Same Day Courier to the prescription address in Epic WAM.    Christian Abbott   [facility]  Specialty Technician

## 2024-02-24 MED FILL — CALQUENCE (ACALABRUTINIB MALEATE) 100 MG TABLET: ORAL | 30 days supply | Qty: 60 | Fill #5

## 2024-02-28 NOTE — Unmapped (Signed)
 Relayed pt results per MD Please let pt know that Ct chest was negative for metastatic disease.

## 2024-03-04 DIAGNOSIS — D509 Iron deficiency anemia, unspecified: Principal | ICD-10-CM

## 2024-03-07 DIAGNOSIS — C189 Malignant neoplasm of colon, unspecified: Principal | ICD-10-CM

## 2024-03-07 NOTE — Unmapped (Signed)
 Called patient and spoke with daughter. Reviewed imaging from last week. Informed her that the indeterminate liver lesions have increased slightly in size and remain indeterminate. Per radiology likley too small to register on PET or to be amenable for biopsy. Slight enlargement of cysts is suspected but metastatic disease cannot be ruled out. 3- 6 month rpeeat MRI is recommended. No other e./o metastatic disease on CT chest or MRI pelvis.  I explained that since he has had persistently negative ctDNA testing - imaging seems less likely to be related to mets. I have ordered an MRI abdomen with/without contrast to be done in 3 months.     Since patient was last seen by APP - she wants to have me see him on 7/18 when he ois scheduled for APP appointment. We will switch him to see me on 7/18 at 8 AM. With labs before.       Carmell Pac, MD  [facility]

## 2024-03-07 NOTE — Unmapped (Signed)
 Per provider, please order mri abdomen with and without contrast to be done in 3 months.    Entered order for MRI abdomen w wo Contrast for review and cosign by provider.

## 2024-03-15 DIAGNOSIS — C9110 Chronic lymphocytic leukemia of B-cell type not having achieved remission: Principal | ICD-10-CM

## 2024-03-15 DIAGNOSIS — C189 Malignant neoplasm of colon, unspecified: Principal | ICD-10-CM

## 2024-03-16 ENCOUNTER — Ambulatory Visit: Admit: 2024-03-16 | Discharge: 2024-03-16 | Payer: Medicare (Managed Care)

## 2024-03-16 ENCOUNTER — Encounter: Admit: 2024-03-16 | Discharge: 2024-03-16 | Payer: Medicare (Managed Care)

## 2024-03-16 DIAGNOSIS — D801 Nonfamilial hypogammaglobulinemia: Principal | ICD-10-CM

## 2024-03-16 DIAGNOSIS — C9110 Chronic lymphocytic leukemia of B-cell type not having achieved remission: Principal | ICD-10-CM

## 2024-03-16 DIAGNOSIS — C189 Malignant neoplasm of colon, unspecified: Principal | ICD-10-CM

## 2024-03-16 LAB — COMPREHENSIVE METABOLIC PANEL
ALBUMIN: 3.3 g/dL — ABNORMAL LOW (ref 3.5–5.0)
ALKALINE PHOSPHATASE: 130 U/L — ABNORMAL HIGH (ref 46–116)
ALT (SGPT): 23 U/L (ref 12–78)
ANION GAP: 6 mmol/L (ref 3–11)
AST (SGOT): 9 U/L — ABNORMAL LOW (ref 15–40)
BILIRUBIN TOTAL: 0.5 mg/dL (ref 0.2–1.0)
BLOOD UREA NITROGEN: 52 mg/dL — ABNORMAL HIGH (ref 8–20)
BUN / CREAT RATIO: 27
CALCIUM: 9.4 mg/dL (ref 8.5–10.1)
CHLORIDE: 104 mmol/L (ref 98–107)
CO2: 28.6 mmol/L (ref 21.0–32.0)
CREATININE: 1.92 mg/dL — ABNORMAL HIGH (ref 0.80–1.30)
EGFR CKD-EPI (2021) MALE: 33 mL/min/1.73m2 — ABNORMAL LOW (ref >=60)
GLUCOSE RANDOM: 166 mg/dL (ref 70–179)
POTASSIUM: 4.2 mmol/L (ref 3.5–5.0)
PROTEIN TOTAL: 6 g/dL (ref 6.0–8.0)
SODIUM: 139 mmol/L (ref 135–145)

## 2024-03-16 LAB — CBC W/ AUTO DIFF
HEMATOCRIT: 37.7 % — ABNORMAL LOW (ref 39.0–48.0)
HEMOGLOBIN: 12.8 g/dL — ABNORMAL LOW (ref 12.9–16.5)
MEAN CORPUSCULAR HEMOGLOBIN CONC: 33.8 g/dL (ref 32.0–36.0)
MEAN CORPUSCULAR HEMOGLOBIN: 29.1 pg (ref 25.9–32.4)
MEAN CORPUSCULAR VOLUME: 85.9 fL (ref 77.6–95.7)
MEAN PLATELET VOLUME: 8.2 fL (ref 6.8–10.7)
PLATELET COUNT: 97 10*9/L — ABNORMAL LOW (ref 150–450)
RED BLOOD CELL COUNT: 4.39 10*12/L (ref 4.26–5.60)
RED CELL DISTRIBUTION WIDTH: 17.2 % — ABNORMAL HIGH (ref 12.2–15.2)
WBC ADJUSTED: 6.5 10*9/L (ref 3.6–11.2)

## 2024-03-16 LAB — IRON & TIBC
IRON SATURATION: 23 % (ref 20–55)
IRON: 55 ug/dL (ref 50–150)
TOTAL IRON BINDING CAPACITY: 238 ug/dL — ABNORMAL LOW (ref 250–450)

## 2024-03-16 LAB — IGG: GAMMAGLOBULIN; IGG: 391 mg/dL — ABNORMAL LOW (ref 646–2013)

## 2024-03-16 LAB — MANUAL DIFFERENTIAL
BASOPHILS - ABS (DIFF): 0 10*9/L (ref 0.0–0.1)
BASOPHILS - REL (DIFF): 0 %
EOSINOPHILS - ABS (DIFF): 0.1 10*9/L (ref 0.0–0.5)
EOSINOPHILS - REL (DIFF): 2 %
LYMPHOCYTES - ABS (DIFF): 1.5 10*9/L (ref 1.1–3.6)
LYMPHOCYTES - REL (DIFF): 23 %
MONOCYTES - ABS (DIFF): 0.7 10*9/L (ref 0.3–0.8)
MONOCYTES - REL (DIFF): 10 %
NEUTROPHILS - ABS (DIFF): 4.2 10*9/L (ref 1.8–7.8)
NEUTROPHILS - REL (DIFF): 65 %

## 2024-03-16 LAB — LACTATE DEHYDROGENASE: LACTATE DEHYDROGENASE: 198 U/L (ref 81–234)

## 2024-03-16 LAB — FERRITIN: FERRITIN: 948 ng/mL — ABNORMAL HIGH (ref 10.5–307.3)

## 2024-03-16 NOTE — Unmapped (Signed)
 [facility] Established Patient Note     Patient Name: Christian Abbott   Date of Birth: [birth year]   Date of Encounter: 03/16/2024     PCP:  Brutus Albertha Sprang, MD      Assessment and Plan    # Chronic Lymphocytic Leukemia  --Rai stage IV CLL  --CLL FISH panel shows 13 q. deletion 21.5%, IGHV mutated -good prognostic features  --11/05/2020: TP53 mutation negative.  NOTCH1 negative.  SF3B1 positive.  SF3B1 positivity indicates poor prognosis.  -- no evidence of hemolysis.   --Indication for treatment:  advanced stage and history of thrombocytopenia  -- He has been treated with Rituxan many years ago by Dr Dennise, with good response in the 1990s. Treatment indication at that time may have been frequent infections.  -- Started Acalabrutinib  12/19/20 - held 1 week prior to surgery on 09/10/22 and restarted on 10/09/2022.    Tolerating Acalabrutinib  well.  CBC very stable. Platelets 97    PLAN:  -- Continue Acalabrutinib  100 mg twice daily   -- given recent cough we will consider giving IVIG more frequently and with goal to keep IgG >400 but will need to be careful about volume overload  given his CKD and diastolic dysfunction.   -- check IgG level today. If <400 will bring him back soon for IVIG 0.4g/kg infusion otherwise will plan for IVIG in late August when he returns.   -- RTC next in ~ 6 weeks      # Indeterminate liver lesions  He has a few small liver lesions that have increased in size over time by 2 to 4 mm over the last year.  These remain indeterminate and may be benign cysts but cannot rule out metastatic disease.  As reported on recent MRI abdomen 02/27/2024 these are likely too small to register on PET and be amenable to liver biopsy therefore will continue surveillance.  Rather than doing a 76-month follow-up MRI I have ordered a 28-month follow-up MRI abdomen to be done in October 2025    # Recent respiratory infection/cold  -- Recently treated with a course of azithromycin and short course of prednisone  -- Recommended symptomatic management with Mucinex      #Stage II Colon Adenocarcinoma  - Mucinous Features [[id number]]  - Colonoscopy for evaluation of iron deficiency anemia revealed a transverse colon lesion consistent with adenocarcinoma --MMR deficient, MLH1 and PMS2 absent expression.  BRAF negative  MLH1 hypermethylation + c/with sporadic MMR-deficient colon adenoCA  -- Staging CT chest without contrast and CT abdomen and pelvis with contrast scheduled for today afternoon  -- 09/10/22 Partial colectomy   -- reviewed pathology in detail and discussed [facility] colon cancer nomogram which shows a >90 % 3 year and 5 year PFS   -- I explained the inherent resistance to 70fu with MMR-deficient colon cancers and I would not recommend adjuvant FOLFOX for him given his age ,and comorbidities. His PS is recovering well after surgery however he is still quite frail. PS 2.   -- Reviewed ctDNA results 10/01/22 low positive 0.02-->10/19/22  negative (0)  -- Although initial test was positive - this test was very low positive and ~ 3w later on 2/20 repeat testing was negative. This suggests low risk of distant metastasis.   -- Given age, comorbidites would recommend against Adjuvant FOLFOX.   --MRI Abdomen 02/18/2023 shows no evidence of metastatic disease in the abdomen   -- CT Chest Wo Contrast 02/18/2023 shows no evidence for new or progressive  intrathoracic malignancy. Improved bibasal aeration with decreased juxtapleural reticular densities. Clustered reticulonodular densities within the lateral right middle and lower lobes, presumably infectious/inflammatory.   -- Reviewed recent CT chest and MRI abdomen/pelvis 07/2023 showed NED. Some changed noted in the LLL which could be inflammation/infection. Patient has remained asymptomatic without fever, cough, productive sputum.  -- Recent CT DNA testing 03/2023 negative. ctDNA drawn  07/23/2023 - negative.   -- Repeat colonoscopy 08/2023 - clear.   -- ctDNA May 2025 - negative, reviewed with pt and provided copy     PLAN:  We will continue ctDNA every 3 months until year 2 ~ 08/2024.   then transition to every 6 months for  year 3-5.    CT DNA in May 2025 was negative   Next test by mobile phlebotomy coodinated by [employer] will be scheduled in August 2025- results can be seen in genetics tab when available.   Continue surveillance visits at least every 3 months for first 2 years until 08/2024 - however patient is being seen every 4-6 weeks for CLL.   Discussed indeterminate liver lesions as noted on MRI abdomen 02/27/2024 noted above .  Repeat MRI abdomen in October 2025 ordered   Next CT chest without contrast and MRI abdomen and pelvis with and without contrast will be due in late December 2025 will order at subsequent visit    As long as no new evidence of metastatic disease we will continue to repeat surveillance imaging every 6 months until approximately January 2026 and then annually for years 3-5      # cold intolerance:  -- TFTs recently normal.   -- CTM    #Midline incisional hernia  -- Asymptomatic.  Fully reducible.  Not incarcerated.  -- CTM.     # History of iron deficiency anemia   -- He was noted to have iron deficiency in 2014 and has since received IV iron in the past and has been on oral iron 1 pill/day.   -- iron deficiency anemia/ACD in 07/2019 with hb down to 8.7 and ferritin 78, iron sat 12%  -- s/p 1020 mg ferheme 07/2019 and early 10/2018 with hb improvement to 10.9 tday.   --EGD and colonoscopy with Dr. Kristie 01/2020 revealed bleeding gastric ulcer, smaller antral ulcer with old blood. .  Currently being managed with sucralfate .  -- In June 2023 hemoglobin was down to 8.3.  He was given additional IV iron with Feraheme  510 mg x 2 doses  -- S/p feraheme  510 mg x 2 doses 9/28-10/9  --S/p IV iron 08/17/2022 and 08/27/2022  -- S/p feraheme  510 mg x 2 doses 01/2024  -- hb 12.8 today.         # Hypogammaglobulinemia   --History level continues to be low however he has not had any more infections since we started him on IVIG. Before we started him on IVIG he has had multiple hospitalizations for pneumonias.  --Therefore we will continue IVIG for now every 3 months as he is tolerated this well.   -- Receiving IVIG 12/2023  -- next IVIG dose will be due ~ Aug 2025    -- reassess IgG level today and will give IVIG sooner if IgG <400    # Diastolic CHF  -- Switched to bumex  as prescribed by cardiology  -- Diuresis has been associated with some degree of renal dysfunction  -- Continue close follow-up with Cardiology    # CKD Stage IV  -- Following closely with [facility].  -- Cr 1.92 today.     # Health maintenance  -- Covid booster 05/27/2023  -- Flu vaccine 05/27/2023  -- RSV vaccine ~07/2023  -- Covid booster ~ 10/2023    Disposition:  RTC 6 weeks    Interval History 03/16/24    Christian Abbott is an 89 year old male with chronic lymphocytic leukemia and stage three colon cancer who presents for routine follow-up and management of his conditions.    He has experienced increased coughing over the last few days, which began approximately 8 to 10 days ago. Initially, his lungs were not completely clear, and he was prescribed prednisone and azithromycin. He is down to his last two days of prednisone. Despite this, his cough has worsened in the last two to three days, affecting his sleep as he was unable to sleep last night due to the cough. The cough is described as dry, with no productive sputum.    He has a history of low IgG levels and receives IVIG every three months. His IgG levels tend to be low, and he experiences frequent colds and coughs, especially when exposed to others, such as his grandson who recently had a runny nose. He is currently taking prednisone, with two doses remaining, and has completed a course of azithromycin. No SOB, no fevers.     Recently celebrated his 89th birthday.     History of Present Illness  Christian Abbott is an 89 year old male with chronic lymphocytic leukemia and Stage II colon cancer who presents for management of these conditions. He is accompanied by his family members.     He is currently being treated for chronic lymphocytic leukemia (CLL) with acalabrutinib  100 mg twice daily.      He experiences fatigue, which has worsened recently,    He also feels very cold, often wearing a jacket indoors, and has experienced episodes of shivering at night, occurring two to three times, with the first episode about a month ago. Last month TFTs were checked and were nromal.     He has a history of colon cancer, with the last colonoscopy in January 2025 being clear. He is under surveillance with CT DNA testing every three months, with the last test in February being negative.    His kidney function shows a fluctuating eGFR.       He receives IVIG every three months. Next due 12/2023                Oncology History:  Hematology/Oncology History Overview Note   89 y/o male with long-standing history of chronic lymphocytic leukemia initially diagnosed in 1998  Status post Rituxan based therapy for 8 weeks during that time  History of anemia treated with Procrit off and on  History of hypertension diabetes asthma hypercholesterolemia.  History of hospitalization in January 2014 for bilateral pneumonia [facility] for 3 weeks.     CLL (chronic lymphocytic leukemia)      07/18/1997 Initial Diagnosis    CLL (chronic lymphocytic leukemia)     Colon adenocarcinoma      09/10/2022 -  Cancer Staged    Staging form: Colon and Rectum, AJCC 8th Edition  - Pathologic stage from 09/10/2022: pT4, pN0, cM0 - Signed by Fairy Pop, MD on 11/03/2022       11/03/2022 Initial Diagnosis    Colon adenocarcinoma (CMS-HCC)         Past Medical History  Patient Active Problem List   Diagnosis  CLL (chronic lymphocytic leukemia)       Iron deficiency anemia    Hypogammaglobulinemia (HHS-HCC)    Anemia    Need for prophylactic immunotherapy    Colon adenocarcinoma Thrombocytopenia     Past Surgical History   has a past surgical history that includes Colonoscopy (2021); Esophagogastroduodenoscopy; Inguinal hernia repair; Cervical spine surgery (2012); Coronary angioplasty with stent (2014); Cataract extraction, bilateral; pr upper gi endoscopy,w/dir submuc inj (N/A, 08/10/2022); pr upper gi endoscopy,biopsy (N/A, 08/10/2022); pr lap,surg,colectomy,w/remvl term ileum (N/A, 09/10/2022); and pr colonoscopy flx dx w/collj spec when pfrmd (N/A, 09/20/2023).    Allergies:   has no known allergies.      Medications:   has a current medication list which includes the following prescription(s): acalabrutinib , accu-chek guide test strips, albuterol  2.5 mg /3 mL (0.083 %) Nebu 3 mL, albuterol  5 mg/mL Nebu 0.5 mL, aspirin, atorvastatin , bumetanide , cholecalciferol (vitamin d3-125 mcg (5,000 unit)), clopidogrel , denta 5000 plus, doxazosin, empagliflozin, famotidine, fluticasone  propionate, folic acid, freestyle libre 3 sensor, glipizide, glucosamine-chondroitin, hydralazine , ipratropium-albuterol , victoza  2-pak, metoprolol  succinate, dulera, peg-electrolyte soln, potassium chloride , prednisone, rybelsus, spironolactone, sucralfate , vit c/e/zn/coppr/lutein/zeaxan, zafirlukast, and glipizide.    Family History/Social History:  Reviewed and updated as appropriate.    Review of Systems:   As per interval history.  All other systems reviewed and negative.    Physical Exam:   Vitals: BP 131/62  - Pulse 59  - Temp 36.3 ??C (97.4 ??F) (Temporal)  - Resp 16  - Ht 160 cm (5' 2.99)  - Wt 62.1 kg (136 lb 14.4 oz)  - SpO2 96%  - BMI 24.26 kg/m??   Per infusion flow sheet, WNL   General:  Pleasant, no acute distress.  ECOG PS 2.   Pain:  Pain Evaluation:                                   Pain Score (0 - 1):   0                             Pain Location:                                       General: NAD, pleasant  HENT:  Atraumatic.   Neck:  thyroid midline.  Lymphatics: Bilateral small cervical lymph nodes.  No supraclavicular axillary or inguinal lymphadenopathy   Cardiovascular:   RRR, No edema   Respiratory:  Unlabored.  Good air entry b/l. No wheezing. CTABL.   Gastrointestinal: Nondistended.  Nontender. No palpable HSM.  Midline insicional hernia. Reducible. Nontender.   Skin:  No rashes or subcutaneous nodules.    Musculoskeletal:  Gait is steady with cane. Moves all extremities   Psychiatric:  Affect appropriate. Judgment and insight nl.  Neurologic:  Alert and oriented x3. Grossly non-focal.            Pertinent Studies/Imaging     MRI Abdomen 02/18/2023  Impression: No evidence of metastatic disease in the abdomen     CT Chest Wo Contrast 02/18/2023   Impression: No evidence for new or progressive intrathoracic malignancy. Improved bibasal aeration with decreased juxtapleural reticular densities. Clustered reticulonodular densities within the lateral right middle and lower lobes, presumably infectious/inflammatory.     Labs:   Results for orders placed or performed  in visit on 03/16/24   CBC w/ Differential   Result Value Ref Range    Results Verified by Slide Scan Slide Reviewed     WBC 6.5 3.6 - 11.2 10*9/L    RBC 4.39 4.26 - 5.60 10*12/L    HGB 12.8 (L) 12.9 - 16.5 g/dL    HCT 62.2 (L) 60.9 - 48.0 %    MCV 85.9 77.6 - 95.7 fL    MCH 29.1 25.9 - 32.4 pg    MCHC 33.8 32.0 - 36.0 g/dL    RDW 82.7 (H) 87.7 - 15.2 %    MPV 8.2 6.8 - 10.7 fL    Platelet 97 (L) 150 - 450 10*9/L    Anisocytosis Slight (A) Not Present     I have independently reviewed notes from prior visits, results from labs and imaging.  The current plan of care requires intensive monitoring for toxicity due to use of antineoplastic treatments.  Medical decision making was of high complexity due to to ongoing risk of morbidity/mortality from antineoplastic treatment.    The patient reports that all of his questions were answered to his satisfaction today. he was encouraged to contact us  for any questions that may arise prior to his next visit.Thank you for the opportunity to participate in Christian Abbott's care.       Carmell Pac, MD  [facility]

## 2024-03-22 ENCOUNTER — Encounter: Admit: 2024-03-22 | Discharge: 2024-03-23 | Payer: Medicare (Managed Care)

## 2024-03-22 DIAGNOSIS — D801 Nonfamilial hypogammaglobulinemia: Principal | ICD-10-CM

## 2024-03-22 MED ADMIN — immun glob G(IgG)-pro-IgA 0-50 (PRIVIGEN) 10 % intravenous solution 20 g: 20 g | INTRAVENOUS | @ 13:00:00 | Stop: 2024-03-22

## 2024-03-22 MED ADMIN — diphenhydrAMINE (BENADRYL) capsule/tablet 25 mg: 25 mg | ORAL | @ 13:00:00 | Stop: 2024-03-22

## 2024-03-22 MED ADMIN — immun glob G(IgG)-pro-IgA 0-50 (PRIVIGEN) 10 % intravenous solution 5 g: 5 g | INTRAVENOUS | @ 15:00:00 | Stop: 2024-03-22

## 2024-03-22 MED ADMIN — sodium chloride (NS) 0.9 % infusion: 20 mL/h | INTRAVENOUS | @ 13:00:00 | Stop: 2024-03-22

## 2024-03-22 MED ADMIN — acetaminophen (TYLENOL) tablet 650 mg: 650 mg | ORAL | @ 13:00:00 | Stop: 2024-03-22

## 2024-03-22 NOTE — Unmapped (Signed)
 Pt arrived for IVIG (privigen ). Daughter states he has been quickly declining this week and finished a dose of steroids but still has a lingering productive cough. Pt was unsteady while walking and also 'freezing' for most of the visit. Daughter states she keeps her house at 16 degrees for him and he still needs a blanket and feels cold. His energy level has declined significantly. Pt given 4-5 blankets and coffee as RN tried to keep him warm. RN suggested daughter bring a heated blanket next visit as well as our blankets lose their heat eventually. PIV placed without issue. Premeds given.  Infusion completed, tolerated well, VSS.  Pt appeared to feel better after infusion was completed. Patient provided drinks and snacks as needed throughout infusion.  Comfort continually assessed by staff.  PIV de-accessed per [facility] protocol. Patient discharged in stable condition, ambulatory.  Return to clinic info reviewed with patient.

## 2024-03-26 DIAGNOSIS — C9110 Chronic lymphocytic leukemia of B-cell type not having achieved remission: Principal | ICD-10-CM

## 2024-03-26 MED ORDER — CALQUENCE (ACALABRUTINIB MALEATE) 100 MG TABLET
ORAL_TABLET | Freq: Two times a day (BID) | ORAL | 5 refills | 30.00000 days | Status: CN
Start: 2024-03-26 — End: ?

## 2024-03-26 MED ORDER — ACALABRUTINIB MALEATE 100 MG TABLET
ORAL_TABLET | Freq: Two times a day (BID) | ORAL | 5 refills | 30.00000 days | Status: CP
Start: 2024-03-26 — End: ?
  Filled 2024-03-28: qty 60, 30d supply, fill #0

## 2024-03-26 NOTE — Unmapped (Signed)
 03/26/2024 - Clinical assessment date was due on 8.17.25. Reached out to [facility] and got approval to proceed with scheduling the patients refill and to move the clinical assessment date to match the next refill coordination.

## 2024-03-26 NOTE — Unmapped (Signed)
 [facility] Refill Coordination Note    Specialty Medication(s) to be Shipped:   Hematology/Oncology: Acalabrutinib     Other medication(s) to be shipped: No additional medications requested for fill at this time     Christian Abbott, DOB: [birth year]  Phone: There are no phone numbers on file.      All above HIPAA information was verified with patient's family member, daughter.     Was a Nurse, learning disability used for this call? No    Completed refill call assessment today to schedule patient's medication shipment from the [facility]  [phone]).  All relevant notes have been reviewed.     Specialty medication(s) and dose(s) confirmed: Regimen is correct and unchanged.   Changes to medications: Christian Abbott reports no changes at this time.  Changes to insurance: No  New side effects reported not previously addressed with a pharmacist or physician: None reported  Questions for the pharmacist: No    Confirmed patient received a Conservation officer, historic buildings and a Surveyor, mining with first shipment. The patient will receive a drug information handout for each medication shipped and additional FDA Medication Guides as required.       DISEASE/MEDICATION-SPECIFIC INFORMATION        N/A    SPECIALTY MEDICATION ADHERENCE     Medication Adherence    Specialty Medication: acalabrutinib : CALQUENCE  100 mg tablet  Patient is on additional specialty medications: No              Were doses missed due to medication being on hold? No      acalabrutinib : CALQUENCE  100 mg tablet: 3 days of medicine on hand       REFERRAL TO PHARMACIST     Referral to the pharmacist: Not needed      [facility]     Shipping address confirmed in Epic.     Cost and Payment: Patient has a $0 copay, payment information is not required.    Delivery Scheduled: Yes, Expected medication delivery date: 7.30.25. Daughter is aware that refill request sent to provider    Medication will be delivered via Same Day Courier to the prescription address in Epic WAM.    Christian Abbott   [facility]  Specialty Technician

## 2024-03-30 DIAGNOSIS — D801 Nonfamilial hypogammaglobulinemia: Principal | ICD-10-CM

## 2024-04-19 NOTE — Unmapped (Signed)
 [facility] Refill Coordination Note    Christian Abbott, DOB: 1934-11-18  Phone: There are no phone numbers on file.      All above HIPAA information was verified with patient.         04/16/2024     4:49 PM   Specialty Rx Medication Refill Questionnaire   Which Medications would you like refilled and shipped? Calquence    Please list all current allergies: None   Have you missed any doses in the last 30 days? No   Have you had any changes to your medication(s) since your last refill? No   How much of each medication do you have remaining at home? (eg. number of tablets, injections, etc.) 20 tablets. 10 day supply   Have you experienced any side effects in the last 30 days? No   Please enter the full address (street address, city, state, zip code) where you would like your medication(s) to be delivered to. [address] , [place]  [place] [address]   Please specify on which day you would like your medication(s) to arrive. Note: if you need your medication(s) within 3 days, please call the pharmacy to schedule your order at [phone]  04/25/2024   Has your insurance changed since your last refill? No   Would you like a pharmacist to call you to discuss your medication(s)? No   Do you require a signature for your package? (Note: if we are billing Medicare Part B or your order contains a controlled substance, we will require a signature) No   I have been provided my out of pocket cost for my medication and approve the pharmacy to charge the amount to my credit card on file. Yes         Completed refill call assessment today to schedule patient's medication shipment from the [facility] [phone]).  All relevant notes have been reviewed.       Confirmed patient received a Conservation officer, historic buildings and a Surveyor, mining with first shipment. The patient will receive a drug information handout for each medication shipped and additional FDA Medication Guides as required.         REFERRAL TO PHARMACIST     Referral to the pharmacist: Not needed      [facility]     Shipping address confirmed in Epic.     Delivery Scheduled: Yes, Expected medication delivery date: 04/25/2024.     Medication will be delivered via Same Day Courier to the prescription address in Epic WAM.     Laymon Duty   [facility] Specialty Technician

## 2024-04-25 MED FILL — CALQUENCE (ACALABRUTINIB MALEATE) 100 MG TABLET: ORAL | 30 days supply | Qty: 60 | Fill #1

## 2024-04-26 DIAGNOSIS — D696 Thrombocytopenia, unspecified: Principal | ICD-10-CM

## 2024-04-26 DIAGNOSIS — C9110 Chronic lymphocytic leukemia of B-cell type not having achieved remission: Principal | ICD-10-CM

## 2024-04-26 DIAGNOSIS — D801 Nonfamilial hypogammaglobulinemia: Principal | ICD-10-CM

## 2024-04-27 ENCOUNTER — Ambulatory Visit: Admit: 2024-04-27 | Discharge: 2024-04-27 | Payer: Medicare (Managed Care)

## 2024-04-27 DIAGNOSIS — C9110 Chronic lymphocytic leukemia of B-cell type not having achieved remission: Principal | ICD-10-CM

## 2024-04-27 DIAGNOSIS — D801 Nonfamilial hypogammaglobulinemia: Principal | ICD-10-CM

## 2024-04-27 DIAGNOSIS — D696 Thrombocytopenia, unspecified: Principal | ICD-10-CM

## 2024-04-27 LAB — MANUAL DIFFERENTIAL
BANDS - REL (DIFF): 1 %
LYMPHOCYTES - ABS (DIFF): 2.9 10*9/L (ref 1.1–3.6)
LYMPHOCYTES - REL (DIFF): 44 %
MONOCYTES - ABS (DIFF): 0.3 10*9/L (ref 0.3–0.8)
MONOCYTES - REL (DIFF): 4 %
NEUTROPHILS - ABS (DIFF): 3.5 10*9/L (ref 1.8–7.8)
NEUTROPHILS - REL (DIFF): 51 %

## 2024-04-27 LAB — CBC W/ AUTO DIFF
HEMATOCRIT: 33.5 % — ABNORMAL LOW (ref 39.0–48.0)
HEMOGLOBIN: 11.4 g/dL — ABNORMAL LOW (ref 12.9–16.5)
MEAN CORPUSCULAR HEMOGLOBIN CONC: 33.9 g/dL (ref 32.0–36.0)
MEAN CORPUSCULAR HEMOGLOBIN: 29.6 pg (ref 25.9–32.4)
MEAN CORPUSCULAR VOLUME: 87.3 fL (ref 77.6–95.7)
MEAN PLATELET VOLUME: 8.7 fL (ref 6.8–10.7)
PLATELET COUNT: 100 10*9/L — ABNORMAL LOW (ref 150–450)
RED BLOOD CELL COUNT: 3.84 10*12/L — ABNORMAL LOW (ref 4.26–5.60)
RED CELL DISTRIBUTION WIDTH: 15.7 % — ABNORMAL HIGH (ref 12.2–15.2)
WBC ADJUSTED: 6.7 10*9/L (ref 3.6–11.2)

## 2024-04-27 LAB — COMPREHENSIVE METABOLIC PANEL
ALBUMIN: 3.1 g/dL — ABNORMAL LOW (ref 3.5–5.0)
ALKALINE PHOSPHATASE: 125 U/L — ABNORMAL HIGH (ref 46–116)
ALT (SGPT): 24 U/L (ref 12–78)
ANION GAP: 7 mmol/L (ref 3–11)
AST (SGOT): 18 U/L (ref 15–40)
BILIRUBIN TOTAL: 0.4 mg/dL (ref 0.2–1.0)
BLOOD UREA NITROGEN: 47 mg/dL — ABNORMAL HIGH (ref 8–20)
BUN / CREAT RATIO: 27
CALCIUM: 8.9 mg/dL (ref 8.5–10.1)
CHLORIDE: 97 mmol/L — ABNORMAL LOW (ref 98–107)
CO2: 26.5 mmol/L (ref 21.0–32.0)
CREATININE: 1.72 mg/dL — ABNORMAL HIGH (ref 0.80–1.30)
EGFR CKD-EPI (2021) MALE: 38 mL/min/1.73m2 — ABNORMAL LOW (ref >=60)
GLUCOSE RANDOM: 88 mg/dL (ref 70–179)
POTASSIUM: 3.8 mmol/L (ref 3.5–5.0)
PROTEIN TOTAL: 6 g/dL (ref 6.0–8.0)
SODIUM: 130 mmol/L — ABNORMAL LOW (ref 135–145)

## 2024-04-27 LAB — LACTATE DEHYDROGENASE: LACTATE DEHYDROGENASE: 236 U/L — ABNORMAL HIGH (ref 81–234)

## 2024-04-27 LAB — IGG: GAMMAGLOBULIN; IGG: 427 mg/dL — ABNORMAL LOW (ref 646–2013)

## 2024-04-27 NOTE — Unmapped (Signed)
 Latest Reference Range & Units 04/27/24 15:28   WBC 3.6 - 11.2 10*9/L 6.7   RBC 4.26 - 5.60 10*12/L 3.84 (L)   HGB 12.9 - 16.5 g/dL 88.5 (L)   HCT 60.9 - 51.9 % 33.5 (L)   MCV 77.6 - 95.7 fL 87.3   MCH 25.9 - 32.4 pg 29.6   MCHC 32.0 - 36.0 g/dL 66.0   RDW 87.7 - 84.7 % 15.7 (H)   MPV 6.8 - 10.7 fL 8.7   Platelet 150 - 450 10*9/L 100 (L)   Neutrophils % % 51   Lymphocytes % % 44   Monocytes % % 4   Bands Relative % 1   Absolute Neutrophils 1.8 - 7.8 10*9/L 3.5   Absolute Lymphocytes 1.1 - 3.6 10*9/L 2.9   Absolute Monocytes  0.3 - 0.8 10*9/L 0.3   Sodium 135 - 145 mmol/L 130 (L)   Potassium 3.5 - 5.0 mmol/L 3.8   Chloride 98 - 107 mmol/L 97 (L)   CO2 21.0 - 32.0 mmol/L 26.5   Bun 8 - 20 mg/dL 47 (H)   Creatinine 9.19 - 1.30 mg/dL 8.27 (H)   BUN/Creatinine Ratio  27   eGFR CKD-EPI (2021) Male >=60 mL/min/1.98m2 38 (L)   Anion Gap 3 - 11 mmol/L 7   Glucose 70 - 179 mg/dL 88   Calcium  8.5 - 10.1 mg/dL 8.9   Albumin 3.5 - 5.0 g/dL 3.1 (L)   Total Protein 6.0 - 8.0 g/dL 6.0   Total Bilirubin 0.2 - 1.0 mg/dL 0.4   SGOT (AST) 15 - 40 U/L 18   ALT 12 - 78 U/L 24   Alkaline Phosphatase 46 - 116 U/L 125 (H)   LDH 81 - 234 U/L 236 (H)   (L): Data is abnormally low  (H): Data is abnormally high

## 2024-04-27 NOTE — Unmapped (Signed)
 [facility] Established Patient Note     Patient Name: Christian Abbott   Date of Birth: 1935-02-10   Date of Encounter: 04/27/2024     PCP:  Brutus Albertha Sprang, MD      Assessment and Plan    # Chronic Lymphocytic Leukemia  --Rai stage IV CLL  --CLL FISH panel shows 13 q. deletion 21.5%, IGHV mutated -good prognostic features  --11/05/2020: TP53 mutation negative.  NOTCH1 negative.  SF3B1 positive.  SF3B1 positivity indicates poor prognosis.  -- no evidence of hemolysis.   --Indication for treatment:  advanced stage and history of thrombocytopenia  -- He has been treated with Rituxan many years ago by Dr Dennise, with good response in the 1990s. Treatment indication at that time may have been frequent infections.  -- Started Acalabrutinib  12/19/20 - held 1 week prior to surgery on 09/10/22 and restarted on 10/09/2022.    Tolerating Acalabrutinib  well.  CBC very stable. Platelets 100    PLAN:  -- Continue Acalabrutinib  100 mg twice daily   -- given recent cough we will consider giving IVIG more frequently and with goal to keep IgG >400 but will need to be careful about volume overload  given his CKD and diastolic dysfunction.   -- RTC next in ~  4 weeks.       # Indeterminate liver lesions  He has a few small liver lesions that have increased in size over time by 2 to 4 mm over the last year.  These remain indeterminate and may be benign cysts but cannot rule out metastatic disease.  As reported on recent MRI abdomen 02/27/2024 these are likely too small to register on PET and be amenable to liver biopsy therefore will continue surveillance.  Rather than doing a 22-month follow-up MRI I have ordered a 39-month follow-up MRI abdomen to be done in October 2025    # Recent respiratory infection/cold  -- Recently treated with a course of azithromycin and short course of prednisone  -- Recommended symptomatic management with Mucinex      #Stage II Colon Adenocarcinoma  - Mucinous Features [pT4N0M0]  - Colonoscopy for evaluation of iron deficiency anemia revealed a transverse colon lesion consistent with adenocarcinoma --MMR deficient, MLH1 and PMS2 absent expression.  BRAF negative  MLH1 hypermethylation + c/with sporadic MMR-deficient colon adenoCA  -- Staging CT chest without contrast and CT abdomen and pelvis with contrast scheduled for today afternoon  -- 09/10/22 Partial colectomy   -- reviewed pathology in detail and discussed [facility] colon cancer nomogram which shows a >90 % 3 year and 5 year PFS   -- I explained the inherent resistance to 62fu with MMR-deficient colon cancers and I would not recommend adjuvant FOLFOX for him given his age ,and comorbidities. His PS is recovering well after surgery however he is still quite frail. PS 2.   -- Reviewed ctDNA results 10/01/22 low positive 0.02-->10/19/22  negative (0)  -- Although initial test was positive - this test was very low positive and ~ 3w later on 2/20 repeat testing was negative. This suggests low risk of distant metastasis.   -- Given age, comorbidites would recommend against Adjuvant FOLFOX.   --MRI Abdomen 02/18/2023 shows no evidence of metastatic disease in the abdomen   -- CT Chest Wo Contrast 02/18/2023 shows no evidence for new or progressive intrathoracic malignancy. Improved bibasal aeration with decreased juxtapleural reticular densities. Clustered reticulonodular densities within the lateral right middle and lower lobes, presumably infectious/inflammatory.   -- Reviewed  recent CT chest and MRI abdomen/pelvis 07/2023 showed NED. Some changed noted in the LLL which could be inflammation/infection. Patient has remained asymptomatic without fever, cough, productive sputum.  -- Recent CT DNA testing 03/2023 negative. ctDNA drawn  07/23/2023 - negative.   -- Repeat colonoscopy 08/2023 - clear.   -- ctDNA May 2025 - negative, reviewed with pt and provided copy     PLAN:  We will continue ctDNA every 3 months until year 2 ~ 08/2024.   then transition to every 6 months for  year 3-5.    CT DNA in May 2025 was negative   Next test by mobile phlebotomy coodinated by [facility] done recently. Pending results.   Continue surveillance visits at least every 3 months for first 2 years until 08/2024 - however patient is being seen every 4-6 weeks for CLL.   Discussed indeterminate liver lesions as noted on MRI abdomen 02/27/2024 noted above .  Repeat MRI abdomen in October 2025 ordered   Next CT chest without contrast and MRI abdomen and pelvis with and without contrast will be due in late December 2025 will order at subsequent visit    As long as no new evidence of metastatic disease we will continue to repeat surveillance imaging every 6 months until approximately January 2026 and then annually for years 3-5      # cold intolerance:  -- TFTs recently normal.   -- CTM    #Midline incisional hernia  -- Asymptomatic.  Fully reducible.  Not incarcerated.  -- CTM.     # History of iron deficiency anemia   -- He was noted to have iron deficiency in 2014 and has since received IV iron in the past and has been on oral iron 1 pill/day.   -- iron deficiency anemia/ACD in 07/2019 with hb down to 8.7 and ferritin 78, iron sat 12%  -- s/p 1020 mg ferheme 07/2019 and early 10/2018 with hb improvement to 10.9 tday.   --EGD and colonoscopy with Dr. Kristie 01/2020 revealed bleeding gastric ulcer, smaller antral ulcer with old blood. .  Currently being managed with sucralfate .  -- In June 2023 hemoglobin was down to 8.3.  He was given additional IV iron with Feraheme  510 mg x 2 doses  -- S/p feraheme  510 mg x 2 doses 9/28-10/9  --S/p IV iron 08/17/2022 and 08/27/2022  -- S/p feraheme  510 mg x 2 doses 01/2024  -- hb 11.4        # Hypogammaglobulinemia   --History level continues to be low however he has not had any more infections since we started him on IVIG. Before we started him on IVIG he has had multiple hospitalizations for pneumonias.  --Therefore we will continue IVIG for now every 3 months as he is tolerated this well.   -- Receiving IVIG 12/2023  -- next IVIG dose will be due ~ Aug 2025    -- reassess IgG level today and will give IVIG sooner if IgG <400  -- today IgG level 427    # Diastolic CHF  -- Switched to bumex  as prescribed by cardiology  -- Diuresis has been associated with some degree of renal dysfunction  -- Continue close follow-up with Cardiology    # CKD Stage IV  -- Following closely with [facility].  -- Cr 1.72 today.     # Health maintenance  -- Covid booster 05/27/2023  -- Flu vaccine 05/27/2023  -- RSV vaccine ~07/2023  -- Covid booster ~ 10/2023  Disposition:  RTC 4 weeks       History of Present Illness      Christian Abbott is an 89 year old male with chronic lymphocytic leukemia and colon cancer who presents for follow-up and lab review.    He has a persistent cough that is improving, and a recent lung infection has resolved.     Recent lab results show a white blood cell count of 6.7, lymphocyte count of 2.9, and platelets at 100. Hemoglobin has decreased slightly from 12.8 to 11.4. The LDH level has increased slightly to 236, above the upper limit of normal. Creatinine level has improved to 1.72 from previous values of 1.92 and 2.09, with an EGFR of 38. Sodium is slightly low at 130, and glucose is stable at 88.    His IgG level was previously recorded at 390, and the current level is pending. He is also undergoing Natera testing to monitor for colon cancer recurrence, with the last test conducted in January 2024. He and his son express concerns about the ability of the Natera test to detect potential metastasis, particularly regarding a spot on the liver.    He has a history of colon cancer and underwent surgery in January 2024. He is scheduled for an MRI on September 30th to monitor for any changes.          Oncology History:  Hematology/Oncology History Overview Note   89 y/o male with long-standing history of chronic lymphocytic leukemia initially diagnosed in 1998  Status post Rituxan based therapy for 8 weeks during that time  History of anemia treated with Procrit off and on  History of hypertension diabetes asthma hypercholesterolemia.  History of hospitalization in January 2014 for bilateral pneumonia [facility] for 3 weeks.     CLL (chronic lymphocytic leukemia)       07/18/1997 Initial Diagnosis    CLL (chronic lymphocytic leukemia)     Colon adenocarcinoma       09/10/2022 -  Cancer Staged    Staging form: Colon and Rectum, AJCC 8th Edition  - Pathologic stage from 09/10/2022: pT4, pN0, cM0 - Signed by Fairy Pop, MD on 11/03/2022       11/03/2022 Initial Diagnosis    Colon adenocarcinoma (CMS-HCC)         Past Medical History  Patient Active Problem List   Diagnosis    CLL (chronic lymphocytic leukemia)        Iron deficiency anemia    Hypogammaglobulinemia (HHS-HCC)    Anemia    Need for prophylactic immunotherapy    Colon adenocarcinoma        Thrombocytopenia     Past Surgical History   has a past surgical history that includes Colonoscopy (2021); Esophagogastroduodenoscopy; Inguinal hernia repair; Cervical spine surgery (2012); Coronary angioplasty with stent (2014); Cataract extraction, bilateral; pr upper gi endoscopy,w/dir submuc inj (N/A, 08/10/2022); pr upper gi endoscopy,biopsy (N/A, 08/10/2022); pr lap,surg,colectomy,w/remvl term ileum (N/A, 09/10/2022); and pr colonoscopy flx dx w/collj spec when pfrmd (N/A, 09/20/2023).    Allergies:   has no known allergies.      Medications:   has a current medication list which includes the following prescription(s): acalabrutinib , accu-chek guide test strips, albuterol  2.5 mg /3 mL (0.083 %) Nebu 3 mL, albuterol  5 mg/mL Nebu 0.5 mL, aspirin, atorvastatin , bumetanide , cholecalciferol (vitamin d3-125 mcg (5,000 unit)), clopidogrel , denta 5000 plus, doxazosin, empagliflozin, famotidine, fluticasone  propionate, folic acid, freestyle libre 3 sensor, glipizide, glipizide, glucosamine-chondroitin, hydralazine , ipratropium-albuterol , victoza  2-pak, metoprolol  succinate, dulera, omeprazole, peg-electrolyte  soln, potassium chloride , prednisone, rybelsus, spironolactone, sucralfate , vit c/e/zn/coppr/lutein/zeaxan, and zafirlukast.    Family History/Social History:  Reviewed and updated as appropriate.    Review of Systems:   As per interval history.  All other systems reviewed and negative.    Physical Exam:   Vitals: BP (S) 142/65  - Pulse 67  - Temp 36.3 ??C (97.4 ??F) (Temporal)  - Resp 16  - Ht 160 cm (5' 2.99)  - Wt 64.1 kg (141 lb 6.4 oz)  - SpO2 97%  - BMI 25.05 kg/m??   Per infusion flow sheet, WNL   General:  Pleasant, no acute distress.  ECOG PS 2.   Pain:  Pain Evaluation:                                   Pain Score (0 - 1):   0                             Pain Location:                                       General: NAD, pleasant  HENT:  Atraumatic.   Neck:  thyroid midline.  Lymphatics: Bilateral small cervical lymph nodes.  No supraclavicular axillary or inguinal lymphadenopathy   Cardiovascular:   RRR, No edema   Respiratory:  Unlabored.  Good air entry b/l. No wheezing. CTABL.   Gastrointestinal: Nondistended.  Nontender. No palpable HSM.  Midline insicional hernia. Reducible. Nontender.   Skin:  No rashes or subcutaneous nodules.    Musculoskeletal:  Gait is steady with cane. Moves all extremities   Psychiatric:  Affect appropriate. Judgment and insight nl.  Neurologic:  Alert and oriented x3. Grossly non-focal.            Pertinent Studies/Imaging     MRI Abdomen 02/18/2023  Impression: No evidence of metastatic disease in the abdomen     CT Chest Wo Contrast 02/18/2023   Impression: No evidence for new or progressive intrathoracic malignancy. Improved bibasal aeration with decreased juxtapleural reticular densities. Clustered reticulonodular densities within the lateral right middle and lower lobes, presumably infectious/inflammatory.     Labs:   Results for orders placed or performed in visit on 04/27/24   Comprehensive Metabolic Panel   Result Value Ref Range    Sodium 130 (L) 135 - 145 mmol/L    Potassium 3.8 3.5 - 5.0 mmol/L    Chloride 97 (L) 98 - 107 mmol/L    CO2 26.5 21.0 - 32.0 mmol/L    Anion Gap 7 3 - 11 mmol/L    BUN 47 (H) 8 - 20 mg/dL    Creatinine 8.27 (H) 0.80 - 1.30 mg/dL    BUN/Creatinine Ratio 27     eGFR CKD-EPI (2021) Male 38 (L) >=60 mL/min/1.69m2    Glucose 88 70 - 179 mg/dL    Calcium  8.9 8.5 - 10.1 mg/dL    Albumin 3.1 (L) 3.5 - 5.0 g/dL    Total Protein 6.0 6.0 - 8.0 g/dL    Total Bilirubin 0.4 0.2 - 1.0 mg/dL    AST 18 15 - 40 U/L    ALT 24 12 - 78 U/L    Alkaline Phosphatase 125 (H) 46 - 116 U/L   Lactate dehydrogenase  Result Value Ref Range    LDH 236 (H) 81 - 234 U/L   CBC w/ Differential   Result Value Ref Range    WBC 6.7 3.6 - 11.2 10*9/L    RBC 3.84 (L) 4.26 - 5.60 10*12/L    HGB 11.4 (L) 12.9 - 16.5 g/dL    HCT 66.4 (L) 60.9 - 48.0 %    MCV 87.3 77.6 - 95.7 fL    MCH 29.6 25.9 - 32.4 pg    MCHC 33.9 32.0 - 36.0 g/dL    RDW 84.2 (H) 87.7 - 15.2 %    MPV 8.7 6.8 - 10.7 fL    Platelet 100 (L) 150 - 450 10*9/L   Manual Differential   Result Value Ref Range    Neutrophils % 51 %    Lymphocytes % 44 %    Monocytes % 4 %    Bands % 1 %    Absolute Neutrophils 3.5 1.8 - 7.8 10*9/L    Absolute Lymphocytes 2.9 1.1 - 3.6 10*9/L    Absolute Monocytes 0.3 0.3 - 0.8 10*9/L    Absolute Eosinophils      Absolute Basophils       I have independently reviewed notes from prior visits, results from labs and imaging.  The current plan of care requires intensive monitoring for toxicity due to use of antineoplastic treatments.  Medical decision making was of high complexity due to to ongoing risk of morbidity/mortality from antineoplastic treatment.    The patient reports that all of his questions were answered to his satisfaction today. he was encouraged to contact us  for any questions that may arise prior to his next visit.Thank you for the opportunity to participate in Stanislaw Brandon's care.       Carmell Pac, MD  [facility]

## 2024-05-06 DIAGNOSIS — D509 Iron deficiency anemia, unspecified: Principal | ICD-10-CM

## 2024-05-08 NOTE — Unmapped (Signed)
 Relayed pt message per MD Let pt know ctdna was negative

## 2024-05-16 NOTE — Unmapped (Signed)
 [facility] Refill Coordination Note    Danarius Mcconathy, DOB: April 26, 1935  Phone: There are no phone numbers on file.      All above HIPAA information was verified with patient.         05/15/2024     9:24 PM   Specialty Rx Medication Refill Questionnaire   Which Medications would you like refilled and shipped? 10 days   Please list all current allergies: None   Have you missed any doses in the last 30 days? No   Have you had any changes to your medication(s) since your last refill? No   How much of each medication do you have remaining at home? (eg. number of tablets, injections, etc.) 21   Have you experienced any side effects in the last 30 days? No   Please enter the full address (street address, city, state, zip code) where you would like your medication(s) to be delivered to. [address], [place]  [place] [address]   Please specify on which day you would like your medication(s) to arrive. Note: if you need your medication(s) within 3 days, please call the pharmacy to schedule your order at [phone]  05/25/2024   Has your insurance changed since your last refill? No   Would you like a pharmacist to call you to discuss your medication(s)? No   Do you require a signature for your package? (Note: if we are billing Medicare Part B or your order contains a controlled substance, we will require a signature) No   I have been provided my out of pocket cost for my medication and approve the pharmacy to charge the amount to my credit card on file. Yes         Completed refill call assessment today to schedule patient's medication shipment from the [facility] [phone]).  All relevant notes have been reviewed.       Confirmed patient received a Conservation officer, historic buildings and a Surveyor, mining with first shipment. The patient will receive a drug information handout for each medication shipped and additional FDA Medication Guides as required. REFERRAL TO PHARMACIST     Referral to the pharmacist: Not needed      [facility]     Shipping address confirmed in Epic.     Delivery Scheduled: Yes, Expected medication delivery date: 05/25/2024.     Medication will be delivered via Next Day Courier to the prescription address in Epic WAM.    Lucie CHRISTELLA Forts   [facility] Specialty Technician

## 2024-05-24 MED FILL — CALQUENCE (ACALABRUTINIB MALEATE) 100 MG TABLET: ORAL | 30 days supply | Qty: 60 | Fill #2

## 2024-05-31 DIAGNOSIS — C189 Malignant neoplasm of colon, unspecified: Principal | ICD-10-CM

## 2024-05-31 DIAGNOSIS — C9110 Chronic lymphocytic leukemia of B-cell type not having achieved remission: Principal | ICD-10-CM

## 2024-05-31 DIAGNOSIS — D801 Nonfamilial hypogammaglobulinemia: Principal | ICD-10-CM

## 2024-06-01 ENCOUNTER — Ambulatory Visit: Admit: 2024-06-01 | Discharge: 2024-06-02 | Payer: Medicare (Managed Care)

## 2024-06-01 DIAGNOSIS — D801 Nonfamilial hypogammaglobulinemia: Principal | ICD-10-CM

## 2024-06-01 LAB — COMPREHENSIVE METABOLIC PANEL
ALBUMIN: 3.2 g/dL — ABNORMAL LOW (ref 3.5–5.0)
ALKALINE PHOSPHATASE: 127 U/L — ABNORMAL HIGH (ref 46–116)
ALT (SGPT): 24 U/L (ref 12–78)
ANION GAP: 11 mmol/L (ref 3–11)
AST (SGOT): 14 U/L — ABNORMAL LOW (ref 15–40)
BILIRUBIN TOTAL: 0.4 mg/dL (ref 0.2–1.0)
BLOOD UREA NITROGEN: 72 mg/dL — ABNORMAL HIGH (ref 8–20)
BUN / CREAT RATIO: 34
CALCIUM: 9.1 mg/dL (ref 8.5–10.1)
CHLORIDE: 104 mmol/L (ref 98–107)
CO2: 25.9 mmol/L (ref 21.0–32.0)
CREATININE: 2.1 mg/dL — ABNORMAL HIGH (ref 0.80–1.30)
EGFR CKD-EPI (2021) MALE: 30 mL/min/1.73m2 — ABNORMAL LOW (ref >=60)
GLUCOSE RANDOM: 309 mg/dL — ABNORMAL HIGH (ref 70–179)
POTASSIUM: 4 mmol/L (ref 3.5–5.0)
PROTEIN TOTAL: 5.6 g/dL — ABNORMAL LOW (ref 6.0–8.0)
SODIUM: 141 mmol/L (ref 135–145)

## 2024-06-01 LAB — CBC W/ AUTO DIFF
BASOPHILS ABSOLUTE COUNT: 0 10*9/L (ref 0.0–0.1)
BASOPHILS RELATIVE PERCENT: 0.3 %
EOSINOPHILS ABSOLUTE COUNT: 0 10*9/L (ref 0.0–0.5)
EOSINOPHILS RELATIVE PERCENT: 0.3 %
HEMATOCRIT: 37.1 % — ABNORMAL LOW (ref 39.0–48.0)
HEMOGLOBIN: 12 g/dL — ABNORMAL LOW (ref 12.9–16.5)
LYMPHOCYTES ABSOLUTE COUNT: 0.9 10*9/L — ABNORMAL LOW (ref 1.1–3.6)
LYMPHOCYTES RELATIVE PERCENT: 18.4 %
MEAN CORPUSCULAR HEMOGLOBIN CONC: 32.5 g/dL (ref 32.0–36.0)
MEAN CORPUSCULAR HEMOGLOBIN: 29.2 pg (ref 25.9–32.4)
MEAN CORPUSCULAR VOLUME: 89.8 fL (ref 77.6–95.7)
MEAN PLATELET VOLUME: 8.2 fL (ref 6.8–10.7)
MONOCYTES ABSOLUTE COUNT: 0.5 10*9/L (ref 0.3–0.8)
MONOCYTES RELATIVE PERCENT: 9.7 %
NEUTROPHILS ABSOLUTE COUNT: 3.4 10*9/L (ref 1.8–7.8)
NEUTROPHILS RELATIVE PERCENT: 71.3 %
PLATELET COUNT: 87 10*9/L — ABNORMAL LOW (ref 150–450)
RED BLOOD CELL COUNT: 4.13 10*12/L — ABNORMAL LOW (ref 4.26–5.60)
RED CELL DISTRIBUTION WIDTH: 14.9 % (ref 12.2–15.2)
WBC ADJUSTED: 4.7 10*9/L (ref 3.6–11.2)

## 2024-06-01 LAB — LACTATE DEHYDROGENASE: LACTATE DEHYDROGENASE: 190 U/L (ref 81–234)

## 2024-06-01 LAB — IGG: GAMMAGLOBULIN; IGG: 274 mg/dL — ABNORMAL LOW (ref 646–2013)

## 2024-06-01 NOTE — Unmapped (Signed)
 [facility] Established Patient Note     Patient Name: Christian Abbott   Date of Birth: Apr 28, 1935   Date of Encounter: 06/01/2024     PCP:  Brutus Albertha Sprang, MD      Assessment and Plan    # Chronic Lymphocytic Leukemia  --Rai stage IV CLL  --CLL FISH panel shows 13 q. deletion 21.5%, IGHV mutated -good prognostic features  --11/05/2020: TP53 mutation negative.  NOTCH1 negative.  SF3B1 positive.  SF3B1 positivity indicates poor prognosis.  -- no evidence of hemolysis.   --Indication for treatment:  advanced stage and history of thrombocytopenia  -- He has been treated with Rituxan many years ago by Dr Dennise, with good response in the 1990s. Treatment indication at that time may have been frequent infections.  -- Started Acalabrutinib  12/19/20 - held 1 week prior to surgery on 09/10/22 and restarted on 10/09/2022.      PLAN:  -- Hold acalbrutinib until completing antibiotics for pna.     -- given recent cough we will consider giving IVIG more frequently and with goal to keep IgG >400 . Check igG level today if <400 will bring him in sooner for IVIG.       # Indeterminate liver lesions  He has a few small liver lesions that have increased in size over time by 2 to 4 mm over the last year.  These remain indeterminate and may be benign cysts but cannot rule out metastatic disease.  As reported on recent MRI abdomen 02/27/2024 these are likely too small to register on PET and be amenable to liver biopsy therefore  I will continue surveillance.  Rather than doing a 22-month follow-up MRI I have ordered a 44-month follow-up MRI abdomen to be done in October 2025.  --Scheduled for 10/8    # Recent respiratory infection/cold  -- Recently treated with a course of azithromycin and short course of prednisone  -- Recommended symptomatic management with Mucinex  -- Follow up with Dr Judge.       #Stage II Colon Adenocarcinoma  - Mucinous Features [[id number]]  - Colonoscopy for evaluation of iron deficiency anemia revealed a transverse colon lesion consistent with adenocarcinoma --MMR deficient, MLH1 and PMS2 absent expression.  BRAF negative  MLH1 hypermethylation + c/with sporadic MMR-deficient colon adenoCA  -- Staging CT chest without contrast and CT abdomen and pelvis with contrast scheduled for today afternoon  -- 09/10/22 Partial colectomy   -- reviewed pathology in detail and discussed [facility] colon cancer nomogram which shows a >90 % 3 year and 5 year PFS   -- I explained the inherent resistance to 46fu with MMR-deficient colon cancers and I would not recommend adjuvant FOLFOX for him given his age ,and comorbidities. His PS is recovering well after surgery however he is still quite frail. PS 2.   -- Reviewed ctDNA results 10/01/22 low positive 0.02-->10/19/22  negative (0)  -- Although initial test was positive - this test was very low positive and ~ 3w later on 2/20 repeat testing was negative. This suggests low risk of distant metastasis.   -- Given age, comorbidites would recommend against Adjuvant FOLFOX.   --MRI Abdomen 02/18/2023 shows no evidence of metastatic disease in the abdomen   -- CT Chest Wo Contrast 02/18/2023 shows no evidence for new or progressive intrathoracic malignancy. Improved bibasal aeration with decreased juxtapleural reticular densities. Clustered reticulonodular densities within the lateral right middle and lower lobes, presumably infectious/inflammatory.   -- Reviewed recent CT chest and MRI  abdomen/pelvis 07/2023 showed NED. Some changed noted in the LLL which could be inflammation/infection. Patient has remained asymptomatic without fever, cough, productive sputum.  -- Recent CT DNA testing 03/2023 negative. ctDNA drawn  07/23/2023 - negative.   -- Repeat colonoscopy 08/2023 - clear.   -- ctDNA 03/2024 - negative,    PLAN:  We will check ctDNA every 3 months until year 2 ~ 08/2024.   then transition to every 6 months for  year 3-5.    CT DNA in 8/ 2025 was negative   Next test by mobile phlebotomy coodinated by [employer] done recently.   Continue surveillance visits at least every 3 months for first 2 years until 08/2024 - however patient is being seen every 4-6 weeks for CLL.   Discussed indeterminate liver lesions as noted on MRI abdomen 02/27/2024 noted above .  Repeat MRI abdomen in October 2025 ordered   Next CT chest without contrast and MRI abdomen and pelvis with and without contrast will be due in late December 2025 will order at subsequent visit    As long as no new evidence of metastatic disease we will continue to repeat surveillance imaging every 6 months until approximately January 2026 and then annually for years 3-5      # cold intolerance:  -- TFTs recently normal.   -- CTM    #Midline incisional hernia  -- Asymptomatic.  Fully reducible.  Not incarcerated.  -- CTM.     # History of iron deficiency anemia   -- He was noted to have iron deficiency in 2014 and has since received IV iron in the past and has been on oral iron 1 pill/day.   -- iron deficiency anemia/ACD in 07/2019 with hb down to 8.7 and ferritin 78, iron sat 12%  -- s/p 1020 mg ferheme 07/2019 and early 10/2018 with hb improvement to 10.9 tday.   --EGD and colonoscopy with Dr. Kristie 01/2020 revealed bleeding gastric ulcer, smaller antral ulcer with old blood. .  Currently being managed with sucralfate .  -- In June 2023 hemoglobin was down to 8.3.  He was given additional IV iron with Feraheme  510 mg x 2 doses  -- S/p feraheme  510 mg x 2 doses 9/28-10/9  --S/p IV iron 08/17/2022 and 08/27/2022  -- S/p feraheme  510 mg x 2 doses 01/2024  -- hb 11.4        # Hypogammaglobulinemia   --History level continues to be low however he has not had any more infections since we started him on IVIG. Before we started him on IVIG he has had multiple hospitalizations for pneumonias.  --Therefore we will continue IVIG for now every 3 months as he is tolerated this well.   -- Receiving IVIG 12/2023  -- next IVIG dose will be due ~ Aug 2025    -- reassess IgG level today and will give IVIG sooner if IgG <400  -- today IgG level 427    # Diastolic CHF  -- Switched to bumex  as prescribed by cardiology  -- Diuresis has been associated with some degree of renal dysfunction  -- Continue close follow-up with Cardiology    # CKD Stage IV  -- Following closely with [facility].  -- Cr 1.72 today.     # Health maintenance  -- Covid booster 05/27/2023  -- Flu vaccine 05/27/2023  -- RSV vaccine ~07/2023  -- Covid booster ~ 10/2023    Disposition:  RTC 4 weeks  Interval History: 10/3  Christian Abbott is an 89 year old male with diabetes who presents for management of CLL and stage III colon cancer.    He has a history of recent pneumonia, which was a continuation of COVID-19 from earlier in the year. Despite treatment with antibiotics and steroids, he reports ongoing respiratory symptoms. He notes that his lungs 'feel more clear,' but continues to report symptoms. He has been on and off steroids over the past few months, and his current regimen includes a tapering dose of prednisone. He is currently completing a course of antibiotics for pneumonia, with three doses remaining.    He has a history of diabetes, with recent blood glucose levels reaching as high as 309 mg/dL. He monitors his glucose levels daily, noting that they have been increasing, particularly while on steroids. His hemoglobin A1c has also been rising, with a recent reading of 11.5%. He is not currently on insulin  but takes 14 mg of an unspecified medication. He reports frequent urination, describing it as 'peeing every fifteen, twenty minutes,' which he attributes to elevated glucose levels.    He mentions a history of COVID-19 infection in February or March, which has been followed by persistent respiratory issues. He has not been hospitalized recently and wants to avoid hospitalization.        History of Present Illness      Christian Abbott is an 89 year old male with chronic lymphocytic leukemia and colon cancer who presents for follow-up and lab review.    He has a persistent cough that is improving, and a recent lung infection has resolved.     Recent lab results show a white blood cell count of 6.7, lymphocyte count of 2.9, and platelets at 100. Hemoglobin has decreased slightly from 12.8 to 11.4. The LDH level has increased slightly to 236, above the upper limit of normal. Creatinine level has improved to 1.72 from previous values of 1.92 and 2.09, with an EGFR of 38. Sodium is slightly low at 130, and glucose is stable at 88.    His IgG level was previously recorded at 390, and the current level is pending. He is also undergoing [employer] testing to monitor for colon cancer recurrence, with the last test conducted in January 2024. He and his son express concerns about the ability of the [employer] test to detect potential metastasis, particularly regarding a spot on the liver.    He has a history of colon cancer and underwent surgery in January 2024. He is scheduled for an MRI on September 30th to monitor for any changes.                Oncology History:  Hematology/Oncology History Overview Note   89 y/o male with long-standing history of chronic lymphocytic leukemia initially diagnosed in 1998  Status post Rituxan based therapy for 8 weeks during that time  History of anemia treated with Procrit off and on  History of hypertension diabetes asthma hypercholesterolemia.  History of hospitalization in January 2014 for bilateral pneumonia [facility] for 3 weeks.     CLL (chronic lymphocytic leukemia) (CMS-HCC)   07/18/1997 Initial Diagnosis    CLL (chronic lymphocytic leukemia)     Colon adenocarcinoma    (CMS-HCC)   09/10/2022 -  Cancer Staged    Staging form: Colon and Rectum, AJCC 8th Edition  - Pathologic stage from 09/10/2022: pT4, pN0, cM0 - Signed by Christian Pop, MD on 11/03/2022       11/03/2022 Initial Diagnosis  Colon adenocarcinoma (CMS-HCC)         Past Medical History  Patient Active Problem List   Diagnosis    CLL (chronic lymphocytic leukemia) (CMS-HCC)    Iron deficiency anemia    Hypogammaglobulinemia (HHS-HCC)    Anemia    Need for prophylactic immunotherapy    Colon adenocarcinoma    (CMS-HCC)    Thrombocytopenia     Past Surgical History   has a past surgical history that includes Colonoscopy (2021); Esophagogastroduodenoscopy; Inguinal hernia repair; Cervical spine surgery (2012); Coronary angioplasty with stent (2014); Cataract extraction, bilateral; pr upper gi endoscopy,w/dir submuc inj (N/A, 08/10/2022); pr upper gi endoscopy,biopsy (N/A, 08/10/2022); pr lap,surg,colectomy,w/remvl term ileum (N/A, 09/10/2022); and pr colonoscopy flx dx w/collj spec when pfrmd (N/A, 09/20/2023).    Allergies:   has no known allergies.      Medications:   has a current medication list which includes the following prescription(s): acalabrutinib , accu-chek guide test strips, albuterol  2.5 mg /3 mL (0.083 %) Nebu 3 mL, albuterol  5 mg/mL Nebu 0.5 mL, aspirin, atorvastatin , bumetanide , cholecalciferol (vitamin d3-125 mcg (5,000 unit)), clopidogrel , denta 5000 plus, doxazosin, empagliflozin, famotidine, fluticasone  propionate, folic acid, freestyle libre 3 sensor, glipizide, glipizide, glucosamine-chondroitin, hydralazine , ipratropium-albuterol , levofloxacin, victoza  2-pak, metoprolol  succinate, dulera, omeprazole, peg-electrolyte soln, potassium chloride , prednisone, rybelsus, spironolactone, sucralfate , vit c/e/zn/coppr/lutein/zeaxan, and zafirlukast.    Family History/Social History:  Reviewed and updated as appropriate.    Review of Systems:   As per interval history.  All other systems reviewed and negative.    Physical Exam:   Vitals: BP (S) 144/72  - Pulse 71  - Temp 36.2 ??C (97.2 ??F) (Temporal)  - Resp 16  - Ht 160 cm (5' 2.99)  - Wt 61.3 kg (135 lb 3.2 oz)  - SpO2 97%  - BMI 23.96 kg/m??   Per infusion flow sheet, WNL   General:  Pleasant, no acute distress.  ECOG PS 2. Pain:  Pain Evaluation:                                   Pain Score (0 - 1):   0                             Pain Location:                                       General: NAD, pleasant  HENT:  Atraumatic.   Neck:  thyroid midline.  Lymphatics: Bilateral small cervical lymph nodes.  No supraclavicular axillary or inguinal lymphadenopathy   Cardiovascular:   RRR, No edema   Respiratory:  Unlabored.  Good air entry b/l. No wheezing. CTABL.   Gastrointestinal: Nondistended.  Nontender. No palpable HSM.  Midline insicional hernia. Reducible. Nontender.   Skin:  No rashes or subcutaneous nodules.    Musculoskeletal:  Gait is steady with cane. Moves all extremities   Psychiatric:  Affect appropriate. Judgment and insight nl.  Neurologic:  Alert and oriented x3. Grossly non-focal.            Pertinent Studies/Imaging     MRI Abdomen 02/18/2023  Impression: No evidence of metastatic disease in the abdomen     CT Chest Wo Contrast 02/18/2023   Impression: No evidence for new or progressive intrathoracic malignancy. Improved bibasal aeration  with decreased juxtapleural reticular densities. Clustered reticulonodular densities within the lateral right middle and lower lobes, presumably infectious/inflammatory.     Labs:   Results for orders placed or performed in visit on 06/01/24   Comprehensive Metabolic Panel   Result Value Ref Range    Sodium 141 135 - 145 mmol/L    Potassium 4.0 3.5 - 5.0 mmol/L    Chloride 104 98 - 107 mmol/L    CO2 25.9 21.0 - 32.0 mmol/L    Anion Gap 11 3 - 11 mmol/L    BUN 72 (H) 8 - 20 mg/dL    Creatinine 7.89 (H) 0.80 - 1.30 mg/dL    BUN/Creatinine Ratio 34     eGFR CKD-EPI (2021) Male 30 (L) >=60 mL/min/1.24m2    Glucose 309 (H) 70 - 179 mg/dL    Calcium  9.1 8.5 - 10.1 mg/dL    Albumin 3.2 (L) 3.5 - 5.0 g/dL    Total Protein 5.6 (L) 6.0 - 8.0 g/dL    Total Bilirubin 0.4 0.2 - 1.0 mg/dL    AST 14 (L) 15 - 40 U/L    ALT 24 12 - 78 U/L    Alkaline Phosphatase 127 (H) 46 - 116 U/L   Lactate dehydrogenase   Result Value Ref Range    LDH 190 81 - 234 U/L   CBC w/ Differential   Result Value Ref Range    Results Verified by Slide Scan Slide Reviewed     WBC 4.7 3.6 - 11.2 10*9/L    RBC 4.13 (L) 4.26 - 5.60 10*12/L    HGB 12.0 (L) 12.9 - 16.5 g/dL    HCT 62.8 (L) 60.9 - 48.0 %    MCV 89.8 77.6 - 95.7 fL    MCH 29.2 25.9 - 32.4 pg    MCHC 32.5 32.0 - 36.0 g/dL    RDW 85.0 87.7 - 84.7 %    MPV 8.2 6.8 - 10.7 fL    Platelet 87 (L) 150 - 450 10*9/L    Neutrophils % 71.3 %    Lymphocytes % 18.4 %    Monocytes % 9.7 %    Eosinophils % 0.3 %    Basophils % 0.3 %    Absolute Neutrophils 3.4 1.8 - 7.8 10*9/L    Absolute Lymphocytes 0.9 (L) 1.1 - 3.6 10*9/L    Absolute Monocytes 0.5 0.3 - 0.8 10*9/L    Absolute Eosinophils 0.0 0.0 - 0.5 10*9/L    Absolute Basophils 0.0 0.0 - 0.1 10*9/L     I have independently reviewed notes from prior visits, results from labs and imaging.  The current plan of care requires intensive monitoring for toxicity due to use of antineoplastic treatments.  Medical decision making was of high complexity due to to ongoing risk of morbidity/mortality from antineoplastic treatment.    The patient reports that all of his questions were answered to his satisfaction today. he was encouraged to contact us  for any questions that may arise prior to his next visit.Thank you for the opportunity to participate in Christian Abbott's care.       Carmell Pac, MD  [facility]

## 2024-06-06 DIAGNOSIS — D801 Nonfamilial hypogammaglobulinemia: Principal | ICD-10-CM

## 2024-06-07 NOTE — Unmapped (Signed)
 Per provider, Please let patient know that his IgG level was <400. As discussed I will change the IVIG infusions to once every 2 months and ask [facility] to schedule it once approved.    Called and spoke with Ms. Sonia, pt's daughter, and relayed message above; she verbalized understanding and appreciation.     Ms. Bartholome asked about results of MRI from yesterday; provider had said I will call her today. On my review - no change which is reassuring. Will call her today by end of day as I am rounding intermittently in hospital this week. Ms. Bartholome verbalized understanding and will look forward to call from provider.'    Ms. Bartholome would like for provider to know that patient has been admitted to [facility] for Rhinovirus; will be in hospital for another day or so.    Encounter routed to provider as fyi.

## 2024-06-11 NOTE — Unmapped (Signed)
 I called patient on 10/9 and informed him and daughter of the recent MRI abdomen results. Lesions in the liver remain indeterminate.     He is currently admitted with rinovirus at [facility].   He is remaining of calquence   until he is discharged and off antibiotics.       MRI abdomen 10/7  -- Redemonstrated and overall unchanged scattered T2 hyperintense hepatic lesions. Given the mucinous pathology of primary colonic malignancy, differential diagnosis continues to include small metastatic lesions versus cysts. No evidence of progressive metastatic disease. Recommend continued attention on follow-up.  -- 0.5 cm cystic lesion within the pancreatic body, likely representing a side branch IPMN. No suspicious features. Recommend continued attention on follow-up.

## 2024-06-19 NOTE — Unmapped (Signed)
 [facility] Refill Coordination Note    Christian Abbott, DOB: [birth year]  Phone: There are no phone numbers on file.      All above HIPAA information was verified with patient.         06/15/2024     1:30 PM   Specialty Rx Medication Refill Questionnaire   Which Medications would you like refilled and shipped? Calquence , Currently have 15 days supply   Please list all current allergies: None   Have you missed any doses in the last 30 days? Yes   If Yes, how many doses have you missed ? >5   Have you had any changes to your medication(s) since your last refill? Yes   Please list your medication(s) changes below. Antibiotics for pneumonia   How much of each medication do you have remaining at home? (eg. number of tablets, injections, etc.) 30 tablets remaining   Have you experienced any side effects in the last 30 days? No   Please enter the full address (street address, city, state, zip code) where you would like your medication(s) to be delivered to. [address] , [place]  [place] [address]   Please specify on which day you would like your medication(s) to arrive. Note: if you need your medication(s) within 3 days, please call the pharmacy to schedule your order at [phone]  07/04/2025   Has your insurance changed since your last refill? No   Would you like a pharmacist to call you to discuss your medication(s)? No   Do you require a signature for your package? (Note: if we are billing Medicare Part B or your order contains a controlled substance, we will require a signature) No   I have been provided my out of pocket cost for my medication and approve the pharmacy to charge the amount to my credit card on file. Yes         Completed refill call assessment today to schedule patient's medication shipment from the [facility] [phone]).  All relevant notes have been reviewed.       Confirmed patient received a Conservation officer, historic buildings and a Surveyor, mining with first shipment. The patient will receive a drug information handout for each medication shipped and additional FDA Medication Guides as required.         REFERRAL TO PHARMACIST     Referral to the pharmacist: Yes - high priority compliance concerns. Patient has missed more than 3 doses of medication. Refills were scheduled and concern routed (high priority) to pharmacist for evaluation.      SHIPPING     Shipping address confirmed in Epic.     Delivery Scheduled: Yes, Expected medication delivery date: 07/04/2024.     Medication will be delivered via Next Day Courier to the prescription address in Epic WAM.    Lucie CHRISTELLA Forts   [facility] Specialty Technician

## 2024-06-19 NOTE — Unmapped (Signed)
 This pharmacist was notified by a technician that this patient has reported that they've missed 5 doses of their Calquence  (provider instructed him to do so due to infection).. I have reviewed the patient's medical record and have determined that no further pharmacist action is needed.      Approximate time spent: 0-5 minutes    Laparis Durrett, RPH, Clinical Specialty Pharmacist  [facility]

## 2024-06-30 DIAGNOSIS — D801 Nonfamilial hypogammaglobulinemia: Principal | ICD-10-CM

## 2024-07-02 ENCOUNTER — Ambulatory Visit: Admit: 2024-07-02 | Discharge: 2024-07-02 | Payer: Medicare (Managed Care)

## 2024-07-02 ENCOUNTER — Encounter: Admit: 2024-07-02 | Discharge: 2024-07-02 | Payer: Medicare (Managed Care)

## 2024-07-02 ENCOUNTER — Ambulatory Visit
Admit: 2024-07-02 | Discharge: 2024-07-02 | Payer: Medicare (Managed Care) | Attending: Hematology & Oncology | Primary: Hematology & Oncology

## 2024-07-02 DIAGNOSIS — C9110 Chronic lymphocytic leukemia of B-cell type not having achieved remission: Principal | ICD-10-CM

## 2024-07-02 DIAGNOSIS — D801 Nonfamilial hypogammaglobulinemia: Principal | ICD-10-CM

## 2024-07-02 DIAGNOSIS — C189 Malignant neoplasm of colon, unspecified: Principal | ICD-10-CM

## 2024-07-02 DIAGNOSIS — D509 Iron deficiency anemia, unspecified: Principal | ICD-10-CM

## 2024-07-02 LAB — COMPREHENSIVE METABOLIC PANEL
ALBUMIN: 3.1 g/dL — ABNORMAL LOW (ref 3.5–5.0)
ALKALINE PHOSPHATASE: 164 U/L — ABNORMAL HIGH (ref 46–116)
ALT (SGPT): 24 U/L (ref 12–78)
ANION GAP: 9 mmol/L (ref 3–11)
AST (SGOT): 17 U/L (ref 15–40)
BILIRUBIN TOTAL: 0.4 mg/dL (ref 0.2–1.0)
BLOOD UREA NITROGEN: 50 mg/dL — ABNORMAL HIGH (ref 8–20)
BUN / CREAT RATIO: 30
CALCIUM: 9.4 mg/dL (ref 8.5–10.1)
CHLORIDE: 102 mmol/L (ref 98–107)
CO2: 27.8 mmol/L (ref 21.0–32.0)
CREATININE: 1.68 mg/dL — ABNORMAL HIGH (ref 0.80–1.30)
EGFR CKD-EPI (2021) MALE: 39 mL/min/1.73m2 — ABNORMAL LOW (ref >=60)
GLUCOSE RANDOM: 345 mg/dL — ABNORMAL HIGH (ref 70–179)
POTASSIUM: 4.2 mmol/L (ref 3.5–5.0)
PROTEIN TOTAL: 5.8 g/dL — ABNORMAL LOW (ref 6.0–8.0)
SODIUM: 139 mmol/L (ref 135–145)

## 2024-07-02 LAB — CBC W/ AUTO DIFF
BASOPHILS ABSOLUTE COUNT: 0.1 10*9/L (ref 0.0–0.1)
BASOPHILS RELATIVE PERCENT: 0.7 %
EOSINOPHILS ABSOLUTE COUNT: 0 10*9/L (ref 0.0–0.5)
EOSINOPHILS RELATIVE PERCENT: 0.3 %
HEMATOCRIT: 36.6 % — ABNORMAL LOW (ref 39.0–48.0)
HEMOGLOBIN: 12 g/dL — ABNORMAL LOW (ref 12.9–16.5)
LYMPHOCYTES ABSOLUTE COUNT: 1.7 10*9/L (ref 1.1–3.6)
LYMPHOCYTES RELATIVE PERCENT: 21.9 %
MEAN CORPUSCULAR HEMOGLOBIN CONC: 32.8 g/dL (ref 32.0–36.0)
MEAN CORPUSCULAR HEMOGLOBIN: 28.9 pg (ref 25.9–32.4)
MEAN CORPUSCULAR VOLUME: 88.1 fL (ref 77.6–95.7)
MEAN PLATELET VOLUME: 8.6 fL (ref 6.8–10.7)
MONOCYTES ABSOLUTE COUNT: 0.6 10*9/L (ref 0.3–0.8)
MONOCYTES RELATIVE PERCENT: 7.6 %
NEUTROPHILS ABSOLUTE COUNT: 5.4 10*9/L (ref 1.8–7.8)
NEUTROPHILS RELATIVE PERCENT: 69.5 %
PLATELET COUNT: 118 10*9/L — ABNORMAL LOW (ref 150–450)
RED BLOOD CELL COUNT: 4.15 10*12/L — ABNORMAL LOW (ref 4.26–5.60)
RED CELL DISTRIBUTION WIDTH: 15.7 % — ABNORMAL HIGH (ref 12.2–15.2)
WBC ADJUSTED: 7.8 10*9/L (ref 3.6–11.2)

## 2024-07-02 LAB — IGG: GAMMAGLOBULIN; IGG: 149 mg/dL — ABNORMAL LOW (ref 646–2013)

## 2024-07-02 LAB — LACTATE DEHYDROGENASE: LACTATE DEHYDROGENASE: 247 U/L — ABNORMAL HIGH (ref 81–234)

## 2024-07-02 LAB — CEA: CARCINOEMBRYONIC ANTIGEN: 2.4 ng/mL (ref 0.0–5.0)

## 2024-07-02 MED ADMIN — sodium chloride (NS) 0.9 % infusion: 20 mL/h | INTRAVENOUS | @ 16:00:00 | Stop: 2024-07-02

## 2024-07-02 MED ADMIN — diphenhydrAMINE (BENADRYL) capsule/tablet 25 mg: 25 mg | ORAL | @ 16:00:00 | Stop: 2024-07-02

## 2024-07-02 MED ADMIN — acetaminophen (TYLENOL) tablet 650 mg: 650 mg | ORAL | @ 16:00:00 | Stop: 2024-07-02

## 2024-07-02 MED ADMIN — immun glob G(IgG)-pro-IgA 0-50 (PRIVIGEN) 10 % intravenous solution 20 g: 20 g | INTRAVENOUS | @ 16:00:00 | Stop: 2024-07-02

## 2024-07-02 NOTE — Progress Notes (Signed)
 Pt here today for IVIG following labs and visit with provider.     Infusion completed, tolerated well, VSS.  Patient provided blankets, drinks, and snacks as needed throughout infusion. Comfort continually assessed by staff.  PIV de-accessed per [facility] protocol. Patient discharged in stable condition, ambulatory.  Return to clinic info reviewed with patient.

## 2024-07-02 NOTE — Progress Notes (Signed)
 [facility] Established Patient Note     Patient Name: Christian Abbott   Date of Birth: [birth year]   Date of Encounter: 07/02/2024     PCP:  Brutus Albertha Sprang, MD      Assessment and Plan    # Chronic Lymphocytic Leukemia  --Rai stage IV CLL  --CLL FISH panel shows 13 q. deletion 21.5%, IGHV mutated -good prognostic features  --11/05/2020: TP53 mutation negative.  NOTCH1 negative.  SF3B1 positive.  SF3B1 positivity indicates poor prognosis.  -- no evidence of hemolysis.   --Indication for treatment:  advanced stage and history of thrombocytopenia  -- He has been treated with Rituxan many years ago by Dr Dennise, with good response in the 1990s. Treatment indication at that time may have been frequent infections.  -- Started Acalabrutinib  12/19/20 - held 1 week prior to surgery on 09/10/22 and restarted on 10/09/2022.  -- Held Acalbrutinib 10/3-10/19 while completing antibiotics for prior  pna and during admission/treatment on multifocal pna/Rhinovirus   -- Resumed Acalbruitinib  10/20    PLAN:  -- based on labs, exam and ROS okay to continue acalbrutinib .   -- continue IVIG every 2 months      # Indeterminate liver lesions  He has a few small liver lesions that have increased in size over time by 2 to 4 mm over the last year.  These remain indeterminate and may be benign cysts but cannot rule out metastatic disease.  As reported on recent MRI abdomen 02/27/2024 these are likely too small to register on PET and be amenable to liver biopsy therefore  I will continue surveillance.  Rather than doing a 88-month follow-up MRI I have ordered a 34-month follow-up MRI abdomen to be done in October 2025.  - 06/05/24: MRI abdomen: indterminate liver lesions have not changed. CTM  -- repeat 3 month interval MRI abdomen - ordered for early 08/2024.  CEA normal and ctDNA has been negative     # Recent recurrent respiratory infections/Rhinovirus infection  -- recently completed course of abx for mutifocal pna and superimposed rhinovirus infection.   -- Multifocal pneumonia, predominantly in the left lung, was diagnosed on October 8th, 2025, with extensive left lung involvement. CT scan on October 8th, 2025, showed diffuse ground glass and nodular opacities, consistent with pneumonia. Follow-up CT scan scheduled for November 7th, 2025, to assess improvement. Biopsy may be considered if no improvement is noted. Inflammation may persist for up to three months.  -- following with Dr Vora.   -- 1 month follow up CT chest ordered   -- if no improvement in GGOs agree with Dr Judge regarding need for bronch and biopsy  - unlikely to be related to malignancy Pneumonitis is not a known s/e of acalbrutinib so I dont think his lung findings are related to medication toxicity         #Stage II Colon Adenocarcinoma  - Mucinous Features [pT4N0M0]  - Colonoscopy for evaluation of iron deficiency anemia revealed a transverse colon lesion consistent with adenocarcinoma --MMR deficient, MLH1 and PMS2 absent expression.  BRAF negative  MLH1 hypermethylation + c/with sporadic MMR-deficient colon adenoCA  -- Staging CT chest without contrast and CT abdomen and pelvis with contrast scheduled for today afternoon  -- 09/10/22 Partial colectomy   -- reviewed pathology in detail and discussed [facility] colon cancer nomogram which shows a >90 % 3 year and 5 year PFS   -- I explained the inherent resistance to 31fu with MMR-deficient colon cancers  and I would not recommend adjuvant FOLFOX for him given his age ,and comorbidities. His PS is recovering well after surgery however he is still quite frail. PS 2.   -- Reviewed ctDNA results 10/01/22 low positive 0.02-->10/19/22  negative (0)  -- Although initial test was positive - this test was very low positive and ~ 3w later on 2/20 repeat testing was negative. This suggests low risk of distant metastasis.   -- Given age, comorbidites would recommend against Adjuvant FOLFOX.   --MRI Abdomen 02/18/2023 shows no evidence of metastatic disease in the abdomen   -- CT Chest Wo Contrast 02/18/2023 shows no evidence for new or progressive intrathoracic malignancy. Improved bibasal aeration with decreased juxtapleural reticular densities. Clustered reticulonodular densities within the lateral right middle and lower lobes, presumably infectious/inflammatory.   -- Reviewed recent CT chest and MRI abdomen/pelvis 07/2023 showed NED. Some changed noted in the LLL which could be inflammation/infection. Patient has remained asymptomatic without fever, cough, productive sputum.  -- Recent CT DNA testing 03/2023 negative. ctDNA drawn  07/23/2023 - negative.   -- Repeat colonoscopy 08/2023 - clear.   -- ctDNA 03/2024 - negative,    PLAN:  We will check ctDNA every 3 months until year 2 ~ 08/2024.   then transition to every 6 months for  year 3-5.    CT DNA in 8/ 2025 was negative   Next test by mobile phlebotomy coodinated by [employer] done recently.  Due in early 06/2024  Continue surveillance visits at least every 3 months for first 2 years until 08/2024 - however patient is being seen every 4-6 weeks for CLL.   Discussed indeterminate liver lesions as noted on MRI abdomen 02/27/2024 noted above .  Repeat MRI abdomen in October 2025 ordered   Next CT chest without contrast and MRI abdomen and pelvis with and without contrast will be due in late December 2025 will order at subsequent visit    As long as no new evidence of metastatic disease we will continue to repeat surveillance imaging every 6 months until approximately January 2026 and then annually for years 3-5      # cold intolerance:  -- TFTs recently normal.   -- CTM    #Midline incisional hernia  -- Asymptomatic.  Fully reducible.  Not incarcerated.  -- CTM.     # History of iron deficiency anemia   -- He was noted to have iron deficiency in 2014 and has since received IV iron in the past and has been on oral iron 1 pill/day.   -- iron deficiency anemia/ACD in 07/2019 with hb down to 8.7 and ferritin 78, iron sat 12%  -- s/p 1020 mg ferheme 07/2019 and early 10/2018 with hb improvement to 10.9 tday.   --EGD and colonoscopy with Dr. Kristie 01/2020 revealed bleeding gastric ulcer, smaller antral ulcer with old blood. .  Currently being managed with sucralfate .  -- In June 2023 hemoglobin was down to 8.3.  He was given additional IV iron with Feraheme  510 mg x 2 doses  -- S/p feraheme  510 mg x 2 doses 9/28-10/9  --S/p IV iron 08/17/2022 and 08/27/2022  -- S/p feraheme  510 mg x 2 doses 01/2024  -- hb 12.0 today        # Hypogammaglobulinemia   --History level continues to be low however he has not had any more infections since we started him on IVIG. Before we started him on IVIG he has had multiple hospitalizations for pneumonias.  --we will  increase frequency of IVIG every 2 months  -- IVIG today      # Diastolic CHF  -- Switched to bumex  as prescribed by cardiology  -- Diuresis has been associated with some degree of renal dysfunction  -- Continue close follow-up with Cardiology    # CKD Stage IV  -- Following closely with [facility].  -- Cr 1.72 today.       Disposition:  RTC 4 weeks           Interval History 07/02/24:  Mr Skalla is a 89 year old male with chronic lymphocytic leukemia who presents for management of his condition and recent pneumonia.    He is currently on acalabrutinib  for chronic lymphocytic leukemia. He held the medication after his last visit on October 3rd to complete an antibiotic course for pneumonia. He was readmitted to [facility] on October 8th with pneumonia and a rhinovirus infection. During this admission, he had extensive left lung pneumonia and rhinovirus infection. A CT scan from October 8th showed diffuse ground glass and nodular opacities, most prominent in the left upper and lower lobes. He was discharged on October 10th and resumed acalabrutinib  on October 20th after completing antibiotics.    He has a history of coronary artery disease. Three vessel coronary artery calcifications were noted on the CT scan. He was seen by a cardiologist during his hospital stay. His hemoglobin is stable at 12.0, and his platelets have improved from 87 to 118. Lymphocytes are low at 1.7, but kidney and liver function tests are pending.    He experienced significant weight loss, losing over ten pounds between July and October, but has regained four to five pounds since his hospital discharge. His daughter reports that his appetite was fine until he was placed on levofloxacin, which was not effective for his pneumonia, leading to worsening symptoms and subsequent hospitalization. He was previously on prednisone and erythromycin in July, and prednisone was restarted when his cough worsened.    He has a history of stage three colon cancer with intermittent liver lesions, and he undergoes regular imaging to monitor for progression.    Interval History: 10/3  Kmarion Dupras is an 89 year old male with diabetes who presents for management of CLL and stage III colon cancer.    He has a history of recent pneumonia, which was a continuation of COVID-19 from earlier in the year. Despite treatment with antibiotics and steroids, he reports ongoing respiratory symptoms. He notes that his lungs 'feel more clear,' but continues to report symptoms. He has been on and off steroids over the past few months, and his current regimen includes a tapering dose of prednisone. He is currently completing a course of antibiotics for pneumonia, with three doses remaining.    He has a history of diabetes, with recent blood glucose levels reaching as high as 309 mg/dL. He monitors his glucose levels daily, noting that they have been increasing, particularly while on steroids. His hemoglobin A1c has also been rising, with a recent reading of 11.5%. He is not currently on insulin  but takes 14 mg of an unspecified medication. He reports frequent urination, describing it as 'peeing every fifteen, twenty minutes,' which he attributes to elevated glucose levels.    He mentions a history of COVID-19 infection in February or March, which has been followed by persistent respiratory issues. He has not been hospitalized recently and wants to avoid hospitalization.        History of Present Illness  Leonides Mundie is an 89 year old male with chronic lymphocytic leukemia and colon cancer who presents for follow-up and lab review.    He has a persistent cough that is improving, and a recent lung infection has resolved.     Recent lab results show a white blood cell count of 6.7, lymphocyte count of 2.9, and platelets at 100. Hemoglobin has decreased slightly from 12.8 to 11.4. The LDH level has increased slightly to 236, above the upper limit of normal. Creatinine level has improved to 1.72 from previous values of 1.92 and 2.09, with an EGFR of 38. Sodium is slightly low at 130, and glucose is stable at 88.    His IgG level was previously recorded at 390, and the current level is pending. He is also undergoing [employer] testing to monitor for colon cancer recurrence, with the last test conducted in January 2024. He and his son express concerns about the ability of the [employer] test to detect potential metastasis, particularly regarding a spot on the liver.    He has a history of colon cancer and underwent surgery in January 2024. He is scheduled for an MRI on September 30th to monitor for any changes.                Oncology History:  Hematology/Oncology History Overview Note   89 y/o male with long-standing history of chronic lymphocytic leukemia initially diagnosed in 1998  Status post Rituxan based therapy for 8 weeks during that time  History of anemia treated with Procrit off and on  History of hypertension diabetes asthma hypercholesterolemia.  History of hospitalization in January 2014 for bilateral pneumonia [facility] for 3 weeks.     CLL (chronic lymphocytic leukemia) (CMS-HCC)   07/18/1997 Initial Diagnosis    CLL (chronic lymphocytic leukemia)     Colon adenocarcinoma    (CMS-HCC)   09/10/2022 -  Cancer Staged    Staging form: Colon and Rectum, AJCC 8th Edition  - Pathologic stage from 09/10/2022: pT4, pN0, cM0 - Signed by Fairy Pop, MD on 11/03/2022       11/03/2022 Initial Diagnosis    Colon adenocarcinoma (CMS-HCC)         Past Medical History  Patient Active Problem List   Diagnosis    CLL (chronic lymphocytic leukemia) (CMS-HCC)    Iron deficiency anemia    Hypogammaglobulinemia (HHS-HCC)    Anemia    Need for prophylactic immunotherapy    Colon adenocarcinoma    (CMS-HCC)    Thrombocytopenia     Past Surgical History   has a past surgical history that includes Colonoscopy (2021); Esophagogastroduodenoscopy; Inguinal hernia repair; Cervical spine surgery (2012); Coronary angioplasty with stent (2014); Cataract extraction, bilateral; pr upper gi endoscopy,w/dir submuc inj (N/A, 08/10/2022); pr upper gi endoscopy,biopsy (N/A, 08/10/2022); pr lap,surg,colectomy,w/remvl term ileum (N/A, 09/10/2022); and pr colonoscopy flx dx w/collj spec when pfrmd (N/A, 09/20/2023).    Allergies:   has no known allergies.      Medications:   has a current medication list which includes the following prescription(s): acalabrutinib , accu-chek guide test strips, albuterol  2.5 mg /3 mL (0.083 %) Nebu 3 mL, albuterol  5 mg/mL Nebu 0.5 mL, aspirin, atorvastatin , bumetanide , cholecalciferol (vitamin d3-125 mcg (5,000 unit)), clopidogrel , denta 5000 plus, doxazosin, empagliflozin, famotidine, fluticasone  propionate, folic acid, freestyle libre 3 sensor, glipizide, glipizide, glucosamine-chondroitin, hydralazine , ipratropium-albuterol , levofloxacin, victoza  2-pak, metoprolol  succinate, dulera, omeprazole, peg-electrolyte soln, potassium chloride , prednisone, rybelsus, spironolactone, sucralfate , tramadol, vit c/e/zn/coppr/lutein/zeaxan, and zafirlukast, and the following Facility-Administered Medications: immun glob  g(igg)-pro-iga 0-50.    Family History/Social History:  Reviewed and updated as appropriate.    Review of Systems:   As per interval history.  All other systems reviewed and negative.    Physical Exam:   Vitals: BP 138/76  - Pulse 60  - Temp 36.3 ??C (97.3 ??F) (Temporal)  - Resp 16  - Ht 160 cm (5' 2.99)  - Wt 62.4 kg (137 lb 9.6 oz)  - SpO2 98%  - BMI 24.38 kg/m??   Per infusion flow sheet, WNL   General:  Pleasant, no acute distress.  ECOG PS 2.   Pain:  Pain Evaluation:                                   Pain Score (0 - 1):   0                             Pain Location:                                       General: NAD, pleasant  HENT:  Atraumatic.   Neck:  thyroid midline.  Lymphatics: Bilateral small cervical lymph nodes.  No supraclavicular axillary or inguinal lymphadenopathy   Cardiovascular:   RRR, No edema   Respiratory:  Unlabored.  Good air entry b/l.  Some adventitious sounds with wheezing and crackles at the left base and mid lung.    Gastrointestinal: Nondistended.  Nontender. No palpable HSM.  Midline insicional hernia. Reducible. Nontender.   Skin:  No rashes or subcutaneous nodules.    Musculoskeletal:  Gait is steady with cane. Moves all extremities   Psychiatric:  Affect appropriate. Judgment and insight nl.  Neurologic:  Alert and oriented x3. Grossly non-focal.            Pertinent Studies/Imaging     MRI Abdomen 02/18/2023  Impression: No evidence of metastatic disease in the abdomen     CT Chest Wo Contrast 02/18/2023   Impression: No evidence for new or progressive intrathoracic malignancy. Improved bibasal aeration with decreased juxtapleural reticular densities. Clustered reticulonodular densities within the lateral right middle and lower lobes, presumably infectious/inflammatory.     Labs:   Results for orders placed or performed in visit on 07/02/24   CBC w/ Differential   Result Value Ref Range    WBC 7.8 3.6 - 11.2 10*9/L    RBC 4.15 (L) 4.26 - 5.60 10*12/L    HGB 12.0 (L) 12.9 - 16.5 g/dL    HCT 63.3 (L) 60.9 - 48.0 %    MCV 88.1 77.6 - 95.7 fL    MCH 28.9 25.9 - 32.4 pg    MCHC 32.8 32.0 - 36.0 g/dL    RDW 84.2 (H) 87.7 - 15.2 %    MPV 8.6 6.8 - 10.7 fL    Platelet 118 (L) 150 - 450 10*9/L    Neutrophils % 69.5 %    Lymphocytes % 21.9 %    Monocytes % 7.6 %    Eosinophils % 0.3 %    Basophils % 0.7 %    Absolute Neutrophils 5.4 1.8 - 7.8 10*9/L    Absolute Lymphocytes 1.7 1.1 - 3.6 10*9/L    Absolute Monocytes 0.6 0.3 - 0.8 10*9/L    Absolute Eosinophils  0.0 0.0 - 0.5 10*9/L    Absolute Basophils 0.1 0.0 - 0.1 10*9/L     *Note: Due to a large number of results and/or encounters for the requested time period, some results have not been displayed. A complete set of results can be found in Results Review.     I have independently reviewed notes from prior visits, results from labs and imaging.  The current plan of care requires intensive monitoring for toxicity due to use of antineoplastic treatments.  Medical decision making was of high complexity due to to ongoing risk of morbidity/mortality from antineoplastic treatment.    The patient reports that all of his questions were answered to his satisfaction today. he was encouraged to contact us  for any questions that may arise prior to his next visit.Thank you for the opportunity to participate in Avyaan Coldwell's care.       Carmell Pac, MD   [facility]

## 2024-07-02 NOTE — Patient Instructions (Signed)
 Latest Reference Range & Units 07/02/24 09:00   WBC 3.6 - 11.2 10*9/L 7.8   RBC 4.26 - 5.60 10*12/L 4.15 (L)   HGB 12.9 - 16.5 g/dL 87.9 (L)   HCT 60.9 - 51.9 % 36.6 (L)   MCV 77.6 - 95.7 fL 88.1   MCH 25.9 - 32.4 pg 28.9   MCHC 32.0 - 36.0 g/dL 67.1   RDW 87.7 - 84.7 % 15.7 (H)   MPV 6.8 - 10.7 fL 8.6   Platelet 150 - 450 10*9/L 118 (L)   Neutrophils % % 69.5   Lymphocytes % % 21.9   Monocytes % % 7.6   Eosinophils % % 0.3   Basophils % % 0.7   Absolute Neutrophils 1.8 - 7.8 10*9/L 5.4   Absolute Lymphocytes 1.1 - 3.6 10*9/L 1.7   Absolute Monocytes  0.3 - 0.8 10*9/L 0.6   Absolute Eosinophils 0.0 - 0.5 10*9/L 0.0   Absolute Basophils  0.0 - 0.1 10*9/L 0.1   (L): Data is abnormally low  (H): Data is abnormally high

## 2024-07-03 MED FILL — CALQUENCE (ACALABRUTINIB MALEATE) 100 MG TABLET: ORAL | 30 days supply | Qty: 60 | Fill #3

## 2024-07-30 NOTE — Progress Notes (Signed)
 [facility] Refill Coordination Note    Christian Abbott, DOB: 1935-07-06  Phone: There are no phone numbers on file.      All above HIPAA information was verified with patient.         07/24/2024    12:56 PM   Specialty Rx Medication Refill Questionnaire   Which Medications would you like refilled and shipped? [place]    Please list all current allergies: None   Have you missed any doses in the last 30 days? No   Have you had any changes to your medication(s) since your last refill? No   How much of each medication do you have remaining at home? (eg. number of tablets, injections, etc.) 40 tablets   Have you experienced any side effects in the last 30 days? No   Please enter the full address (street address, city, state, zip code) where you would like your medication(s) to be delivered to. [address] , [place] [address]   Please specify on which day you would like your medication(s) to arrive. Note: if you need your medication(s) within 3 days, please call the pharmacy to schedule your order at [phone]  08/10/2924   Has your insurance changed since your last refill? No   Would you like a pharmacist to call you to discuss your medication(s)? No   Do you require a signature for your package? (Note: if we are billing Medicare Part B or your order contains a controlled substance, we will require a signature) No   I have been provided my out of pocket cost for my medication and approve the pharmacy to charge the amount to my credit card on file. Yes         Completed refill call assessment today to schedule patient's medication shipment from the [facility] [phone]).  All relevant notes have been reviewed.       Confirmed patient received a Conservation Officer, Historic Buildings and a Surveyor, Mining with first shipment. The patient will receive a drug information handout for each medication shipped and additional FDA Medication Guides as required. REFERRAL TO PHARMACIST     Referral to the pharmacist: Not needed      [facility]     Shipping address confirmed in Epic.     Delivery Scheduled: Yes, Expected medication delivery date: 08/10/2024.     Medication will be delivered via Next Day Courier to the prescription address in Epic WAM.    Christian Abbott CHRISTELLA Dross   [facility] Specialty Technician
